# Patient Record
Sex: Female | Born: 1960
Health system: Southern US, Community
[De-identification: ages and names within clinical notes are randomized; demographics above are authoritative.]

## PROBLEM LIST (undated history)

## (undated) ENCOUNTER — Emergency Department (HOSPITAL_COMMUNITY): Admission: EM | Payer: PRIVATE HEALTH INSURANCE | Source: Home / Self Care

## (undated) DIAGNOSIS — F319 Bipolar disorder, unspecified: Secondary | ICD-10-CM

## (undated) DIAGNOSIS — M25561 Pain in right knee: Secondary | ICD-10-CM

## (undated) DIAGNOSIS — N898 Other specified noninflammatory disorders of vagina: Secondary | ICD-10-CM

## (undated) DIAGNOSIS — M25562 Pain in left knee: Secondary | ICD-10-CM

## (undated) DIAGNOSIS — G8929 Other chronic pain: Secondary | ICD-10-CM

## (undated) DIAGNOSIS — M79606 Pain in leg, unspecified: Secondary | ICD-10-CM

## (undated) DIAGNOSIS — N390 Urinary tract infection, site not specified: Secondary | ICD-10-CM

## (undated) DIAGNOSIS — IMO0002 Reserved for concepts with insufficient information to code with codable children: Secondary | ICD-10-CM

## (undated) DIAGNOSIS — M549 Dorsalgia, unspecified: Secondary | ICD-10-CM

## (undated) DIAGNOSIS — L299 Pruritus, unspecified: Secondary | ICD-10-CM

## (undated) DIAGNOSIS — B009 Herpesviral infection, unspecified: Secondary | ICD-10-CM

## (undated) DIAGNOSIS — A599 Trichomoniasis, unspecified: Secondary | ICD-10-CM

## (undated) DIAGNOSIS — B379 Candidiasis, unspecified: Secondary | ICD-10-CM

## (undated) DIAGNOSIS — M1990 Unspecified osteoarthritis, unspecified site: Secondary | ICD-10-CM

## (undated) DIAGNOSIS — Z8619 Personal history of other infectious and parasitic diseases: Secondary | ICD-10-CM

## (undated) DIAGNOSIS — K219 Gastro-esophageal reflux disease without esophagitis: Secondary | ICD-10-CM

## (undated) DIAGNOSIS — M797 Fibromyalgia: Secondary | ICD-10-CM

## (undated) HISTORY — DX: Gastro-esophageal reflux disease without esophagitis: K21.9

## (undated) HISTORY — DX: Trichomoniasis, unspecified: A59.9

## (undated) HISTORY — DX: Personal history of other infectious and parasitic diseases: Z86.19

## (undated) HISTORY — DX: Pruritus, unspecified: L29.9

## (undated) HISTORY — DX: Urinary tract infection, site not specified: N39.0

## (undated) HISTORY — DX: Other specified noninflammatory disorders of vagina: N89.8

## (undated) HISTORY — DX: Unspecified osteoarthritis, unspecified site: M19.90

## (undated) HISTORY — PX: BREAST ENHANCEMENT SURGERY: SHX7

## (undated) HISTORY — PX: ABDOMINAL HYSTERECTOMY: SHX81

## (undated) HISTORY — DX: Reserved for concepts with insufficient information to code with codable children: IMO0002

## (undated) HISTORY — DX: Herpesviral infection, unspecified: B00.9

## (undated) HISTORY — DX: Candidiasis, unspecified: B37.9

## (undated) HISTORY — PX: TUBAL LIGATION: SHX77

---

## 2000-12-30 ENCOUNTER — Emergency Department (HOSPITAL_COMMUNITY): Admission: EM | Admit: 2000-12-30 | Discharge: 2000-12-30 | Payer: Self-pay | Admitting: *Deleted

## 2001-02-05 ENCOUNTER — Emergency Department (HOSPITAL_COMMUNITY): Admission: EM | Admit: 2001-02-05 | Discharge: 2001-02-05 | Payer: Self-pay | Admitting: Emergency Medicine

## 2001-11-04 ENCOUNTER — Emergency Department (HOSPITAL_COMMUNITY): Admission: EM | Admit: 2001-11-04 | Discharge: 2001-11-04 | Payer: Self-pay | Admitting: Emergency Medicine

## 2001-12-05 ENCOUNTER — Encounter: Payer: Self-pay | Admitting: General Surgery

## 2001-12-05 ENCOUNTER — Ambulatory Visit (HOSPITAL_COMMUNITY): Admission: RE | Admit: 2001-12-05 | Discharge: 2001-12-05 | Payer: Self-pay | Admitting: General Surgery

## 2001-12-19 ENCOUNTER — Other Ambulatory Visit: Admission: RE | Admit: 2001-12-19 | Discharge: 2001-12-19 | Payer: Self-pay | Admitting: General Surgery

## 2002-04-21 ENCOUNTER — Emergency Department (HOSPITAL_COMMUNITY): Admission: EM | Admit: 2002-04-21 | Discharge: 2002-04-21 | Payer: Self-pay | Admitting: Emergency Medicine

## 2002-04-21 ENCOUNTER — Encounter: Payer: Self-pay | Admitting: Emergency Medicine

## 2002-09-03 ENCOUNTER — Encounter: Payer: Self-pay | Admitting: Emergency Medicine

## 2002-09-03 ENCOUNTER — Emergency Department (HOSPITAL_COMMUNITY): Admission: EM | Admit: 2002-09-03 | Discharge: 2002-09-03 | Payer: Self-pay | Admitting: Emergency Medicine

## 2002-09-25 ENCOUNTER — Emergency Department (HOSPITAL_COMMUNITY): Admission: EM | Admit: 2002-09-25 | Discharge: 2002-09-25 | Payer: Self-pay | Admitting: Internal Medicine

## 2002-11-01 ENCOUNTER — Encounter: Payer: Self-pay | Admitting: Emergency Medicine

## 2002-11-01 ENCOUNTER — Emergency Department (HOSPITAL_COMMUNITY): Admission: EM | Admit: 2002-11-01 | Discharge: 2002-11-01 | Payer: Self-pay | Admitting: Emergency Medicine

## 2003-02-27 ENCOUNTER — Emergency Department (HOSPITAL_COMMUNITY): Admission: EM | Admit: 2003-02-27 | Discharge: 2003-02-27 | Payer: Self-pay | Admitting: Emergency Medicine

## 2003-08-19 ENCOUNTER — Other Ambulatory Visit: Admission: RE | Admit: 2003-08-19 | Discharge: 2003-08-19 | Payer: Self-pay

## 2005-04-05 ENCOUNTER — Ambulatory Visit (HOSPITAL_COMMUNITY): Admission: RE | Admit: 2005-04-05 | Discharge: 2005-04-05 | Payer: Self-pay | Admitting: Emergency Medicine

## 2005-04-05 ENCOUNTER — Encounter: Payer: Self-pay | Admitting: Orthopedic Surgery

## 2005-04-21 ENCOUNTER — Emergency Department (HOSPITAL_COMMUNITY): Admission: EM | Admit: 2005-04-21 | Discharge: 2005-04-21 | Payer: Self-pay | Admitting: Emergency Medicine

## 2005-04-22 ENCOUNTER — Emergency Department (HOSPITAL_COMMUNITY): Admission: EM | Admit: 2005-04-22 | Discharge: 2005-04-22 | Payer: Self-pay | Admitting: Emergency Medicine

## 2005-04-23 ENCOUNTER — Inpatient Hospital Stay (HOSPITAL_COMMUNITY): Admission: EM | Admit: 2005-04-23 | Discharge: 2005-04-25 | Payer: Self-pay | Admitting: Emergency Medicine

## 2005-04-23 ENCOUNTER — Encounter: Payer: Self-pay | Admitting: Emergency Medicine

## 2006-05-11 ENCOUNTER — Emergency Department (HOSPITAL_COMMUNITY): Admission: EM | Admit: 2006-05-11 | Discharge: 2006-05-11 | Payer: Self-pay | Admitting: Emergency Medicine

## 2006-09-14 ENCOUNTER — Emergency Department (HOSPITAL_COMMUNITY): Admission: EM | Admit: 2006-09-14 | Discharge: 2006-09-14 | Payer: Self-pay | Admitting: Emergency Medicine

## 2006-09-29 ENCOUNTER — Emergency Department (HOSPITAL_COMMUNITY): Admission: EM | Admit: 2006-09-29 | Discharge: 2006-09-29 | Payer: Self-pay | Admitting: Emergency Medicine

## 2006-10-08 ENCOUNTER — Emergency Department (HOSPITAL_COMMUNITY): Admission: EM | Admit: 2006-10-08 | Discharge: 2006-10-08 | Payer: Self-pay | Admitting: Emergency Medicine

## 2006-10-19 ENCOUNTER — Emergency Department (HOSPITAL_COMMUNITY): Admission: EM | Admit: 2006-10-19 | Discharge: 2006-10-19 | Payer: Self-pay | Admitting: Emergency Medicine

## 2006-10-19 ENCOUNTER — Encounter: Payer: Self-pay | Admitting: Orthopedic Surgery

## 2006-11-11 ENCOUNTER — Emergency Department (HOSPITAL_COMMUNITY): Admission: EM | Admit: 2006-11-11 | Discharge: 2006-11-11 | Payer: Self-pay | Admitting: Emergency Medicine

## 2007-01-16 ENCOUNTER — Emergency Department (HOSPITAL_COMMUNITY): Admission: EM | Admit: 2007-01-16 | Discharge: 2007-01-16 | Payer: Self-pay | Admitting: Emergency Medicine

## 2007-01-25 ENCOUNTER — Ambulatory Visit: Payer: Self-pay | Admitting: Orthopedic Surgery

## 2007-01-29 ENCOUNTER — Other Ambulatory Visit: Admission: RE | Admit: 2007-01-29 | Discharge: 2007-01-29 | Payer: Self-pay | Admitting: Obstetrics & Gynecology

## 2007-04-04 ENCOUNTER — Telehealth: Payer: Self-pay | Admitting: Orthopedic Surgery

## 2007-04-04 ENCOUNTER — Ambulatory Visit: Payer: Self-pay | Admitting: Orthopedic Surgery

## 2007-04-04 DIAGNOSIS — IMO0002 Reserved for concepts with insufficient information to code with codable children: Secondary | ICD-10-CM

## 2007-04-04 DIAGNOSIS — M5136 Other intervertebral disc degeneration, lumbar region: Secondary | ICD-10-CM

## 2007-04-06 ENCOUNTER — Telehealth: Payer: Self-pay | Admitting: Orthopedic Surgery

## 2007-04-17 ENCOUNTER — Encounter: Payer: Self-pay | Admitting: Orthopedic Surgery

## 2007-04-21 ENCOUNTER — Emergency Department (HOSPITAL_COMMUNITY): Admission: EM | Admit: 2007-04-21 | Discharge: 2007-04-21 | Payer: Self-pay | Admitting: Emergency Medicine

## 2007-05-14 ENCOUNTER — Ambulatory Visit (HOSPITAL_COMMUNITY): Admission: RE | Admit: 2007-05-14 | Discharge: 2007-05-14 | Payer: Self-pay | Admitting: Orthopedic Surgery

## 2007-05-28 ENCOUNTER — Encounter: Admission: RE | Admit: 2007-05-28 | Discharge: 2007-05-28 | Payer: Self-pay | Admitting: Orthopedic Surgery

## 2007-06-13 ENCOUNTER — Encounter: Admission: RE | Admit: 2007-06-13 | Discharge: 2007-06-13 | Payer: Self-pay | Admitting: Internal Medicine

## 2007-09-06 ENCOUNTER — Emergency Department (HOSPITAL_COMMUNITY): Admission: EM | Admit: 2007-09-06 | Discharge: 2007-09-06 | Payer: Self-pay | Admitting: Emergency Medicine

## 2007-10-18 ENCOUNTER — Emergency Department (HOSPITAL_COMMUNITY): Admission: EM | Admit: 2007-10-18 | Discharge: 2007-10-18 | Payer: Self-pay | Admitting: Emergency Medicine

## 2008-07-14 ENCOUNTER — Other Ambulatory Visit: Admission: RE | Admit: 2008-07-14 | Discharge: 2008-07-14 | Payer: Self-pay | Admitting: Obstetrics & Gynecology

## 2008-07-25 ENCOUNTER — Emergency Department (HOSPITAL_COMMUNITY): Admission: EM | Admit: 2008-07-25 | Discharge: 2008-07-25 | Payer: Self-pay | Admitting: Emergency Medicine

## 2008-11-15 ENCOUNTER — Emergency Department (HOSPITAL_COMMUNITY): Admission: EM | Admit: 2008-11-15 | Discharge: 2008-11-15 | Payer: Self-pay | Admitting: Emergency Medicine

## 2009-01-13 ENCOUNTER — Emergency Department (HOSPITAL_COMMUNITY): Admission: EM | Admit: 2009-01-13 | Discharge: 2009-01-13 | Payer: Self-pay | Admitting: Emergency Medicine

## 2009-03-14 ENCOUNTER — Emergency Department (HOSPITAL_COMMUNITY): Admission: EM | Admit: 2009-03-14 | Discharge: 2009-03-14 | Payer: Self-pay | Admitting: Emergency Medicine

## 2009-03-31 ENCOUNTER — Emergency Department (HOSPITAL_COMMUNITY): Admission: EM | Admit: 2009-03-31 | Discharge: 2009-03-31 | Payer: Self-pay | Admitting: Emergency Medicine

## 2009-07-07 ENCOUNTER — Emergency Department (HOSPITAL_COMMUNITY): Admission: EM | Admit: 2009-07-07 | Discharge: 2009-07-07 | Payer: Self-pay | Admitting: Emergency Medicine

## 2009-08-20 ENCOUNTER — Emergency Department (HOSPITAL_COMMUNITY)
Admission: EM | Admit: 2009-08-20 | Discharge: 2009-08-20 | Payer: Self-pay | Source: Home / Self Care | Admitting: Emergency Medicine

## 2009-10-29 ENCOUNTER — Emergency Department (HOSPITAL_COMMUNITY): Admission: EM | Admit: 2009-10-29 | Discharge: 2009-10-29 | Payer: Self-pay | Admitting: Emergency Medicine

## 2010-02-15 ENCOUNTER — Other Ambulatory Visit: Admission: RE | Admit: 2010-02-15 | Discharge: 2010-02-15 | Payer: Self-pay | Admitting: Obstetrics & Gynecology

## 2010-02-23 ENCOUNTER — Ambulatory Visit (HOSPITAL_COMMUNITY): Admission: RE | Admit: 2010-02-23 | Payer: Self-pay | Admitting: Obstetrics & Gynecology

## 2010-05-08 ENCOUNTER — Encounter: Payer: Self-pay | Admitting: Obstetrics & Gynecology

## 2010-05-09 ENCOUNTER — Encounter: Payer: Self-pay | Admitting: Orthopedic Surgery

## 2010-05-09 ENCOUNTER — Encounter: Payer: Self-pay | Admitting: Internal Medicine

## 2010-05-27 ENCOUNTER — Other Ambulatory Visit: Payer: Self-pay | Admitting: Obstetrics & Gynecology

## 2010-05-27 ENCOUNTER — Encounter (HOSPITAL_COMMUNITY): Payer: PRIVATE HEALTH INSURANCE | Attending: Obstetrics & Gynecology

## 2010-05-27 DIAGNOSIS — Z01812 Encounter for preprocedural laboratory examination: Secondary | ICD-10-CM | POA: Insufficient documentation

## 2010-05-27 LAB — CBC
HCT: 39.4 % (ref 36.0–46.0)
MCH: 30.5 pg (ref 26.0–34.0)
MCHC: 33.2 g/dL (ref 30.0–36.0)
MCV: 91.6 fL (ref 78.0–100.0)
Platelets: 309 10*3/uL (ref 150–400)

## 2010-05-27 LAB — URINALYSIS, ROUTINE W REFLEX MICROSCOPIC
Bilirubin Urine: NEGATIVE
Hgb urine dipstick: NEGATIVE
Ketones, ur: NEGATIVE mg/dL

## 2010-05-27 LAB — COMPREHENSIVE METABOLIC PANEL
AST: 17 U/L (ref 0–37)
Albumin: 4 g/dL (ref 3.5–5.2)
Alkaline Phosphatase: 64 U/L (ref 39–117)
BUN: 14 mg/dL (ref 6–23)
CO2: 28 mEq/L (ref 19–32)
Calcium: 9.1 mg/dL (ref 8.4–10.5)
Chloride: 99 mEq/L (ref 96–112)
Creatinine, Ser: 0.68 mg/dL (ref 0.4–1.2)
GFR calc Af Amer: >60 mL/min (ref >60)
GFR calc non Af Amer: >60 mL/min (ref >60)

## 2010-05-27 LAB — TYPE AND SCREEN
ABO/RH(D): B POS
Antibody Screen: NEGATIVE

## 2010-05-27 LAB — HCG, QUANTITATIVE, PREGNANCY: hCG, Beta Chain, Quant, S: 2 m[IU]/mL (ref <5)

## 2010-05-27 LAB — SURGICAL PCR SCREEN: Staphylococcus aureus: NEGATIVE

## 2010-06-02 ENCOUNTER — Inpatient Hospital Stay (HOSPITAL_COMMUNITY)
Admission: RE | Admit: 2010-06-02 | Discharge: 2010-06-04 | DRG: 743 | Disposition: A | Discharge status: Home / Self Care | Payer: PRIVATE HEALTH INSURANCE | Source: Ambulatory Visit | Attending: Obstetrics & Gynecology | Admitting: Obstetrics & Gynecology

## 2010-06-02 ENCOUNTER — Other Ambulatory Visit: Payer: Self-pay | Admitting: Obstetrics & Gynecology

## 2010-06-02 DIAGNOSIS — N920 Excessive and frequent menstruation with regular cycle: Principal | ICD-10-CM | POA: Diagnosis present

## 2010-06-02 DIAGNOSIS — F319 Bipolar disorder, unspecified: Secondary | ICD-10-CM | POA: Diagnosis present

## 2010-06-02 DIAGNOSIS — K219 Gastro-esophageal reflux disease without esophagitis: Secondary | ICD-10-CM | POA: Diagnosis present

## 2010-06-02 DIAGNOSIS — G8929 Other chronic pain: Secondary | ICD-10-CM | POA: Diagnosis present

## 2010-06-02 DIAGNOSIS — F411 Generalized anxiety disorder: Secondary | ICD-10-CM | POA: Diagnosis present

## 2010-06-02 DIAGNOSIS — D259 Leiomyoma of uterus, unspecified: Secondary | ICD-10-CM | POA: Diagnosis present

## 2010-06-02 DIAGNOSIS — N946 Dysmenorrhea, unspecified: Secondary | ICD-10-CM | POA: Diagnosis present

## 2010-06-03 LAB — CBC
HCT: 36.8 % (ref 36.0–46.0)
Hemoglobin: 11.7 g/dL — ABNORMAL LOW (ref 12.0–15.0)
MCHC: 31.8 g/dL (ref 30.0–36.0)
Platelets: 267 10*3/uL (ref 150–400)
RDW: 13.6 % (ref 11.5–15.5)
WBC: 8 10*3/uL (ref 4.0–10.5)

## 2010-06-03 LAB — DIFFERENTIAL
Eosinophils Relative: 0 % (ref 0–5)
Lymphocytes Relative: 29 % (ref 12–46)
Lymphs Abs: 2.3 10*3/uL (ref 0.7–4.0)
Monocytes Relative: 10 % (ref 3–12)
Neutro Abs: 4.8 10*3/uL (ref 1.7–7.7)

## 2010-06-04 NOTE — Op Note (Signed)
[name], Annette Galvan                 ACCOUNT NO.:  [id number]  MEDICAL RECORD NO.:  [id number]           PATIENT TYPE:  I  LOCATION:  A213                          FACILITY:  [facility]  PHYSICIAN:  Lazaro Arms, M.D.   DATE OF BIRTH:  1960/12/15  DATE OF PROCEDURE: DATE OF DISCHARGE:                              OPERATIVE REPORT   PREOPERATIVE DIAGNOSES: 1. Enlarged fibroid uterus. 2. Menometrorrhagia. 3. Dysmenorrhea. 4. Chronic pelvic pain.  POSTOPERATIVE DIAGNOSES: 1. Enlarged fibroid uterus. 2. Menometrorrhagia. 3. Dysmenorrhea. 4. Chronic pelvic pain.  PROCEDURE:  Abdominal hysterectomy with bilateral salpingo-oophorectomy.  SURGEON:  Lazaro Arms, M.D.  ANESTHESIA:  General endotracheal.  FINDINGS:  The patient had multiple fibroids, which we knew preoperatively and approximately 10-week size.  The ovaries appeared to be normal.  There were no other intraperitoneal abnormalities seen.  DESCRIPTION OF OPERATION:  The patient was taken to the operating room, placed in the supine position where she underwent general endotracheal anesthesia.  The vagina was prepped, and a Foley catheter was placed. The abdomen was then prepped and draped in usual sterile fashion.  A Pfannenstiel skin incision was made and carried down sharply to the rectus fascia which was scored in the midline and extended laterally. The fascia was taken off the muscles superiorly and inferiorly without difficulty.  The muscles were divided.  Peritoneal cavity was entered. An Alexis self-retaining wound retractor was placed.  The upper abdomen was packed away.  The uterine cornu were grasped.  The left round ligament was suture ligated and cut.  The right round ligament was suture ligated and cut.  The infundibulopelvic ligament was isolated on the left.  An avascular window made; it was clamped, cut, and suture ligated.  The infundibulopelvic ligament on the right was also isolated and avascular  window made; it was clamped, cut, and suture ligated.  The bladder flap was created without difficulty.  The uterine vessels were skeletonized, clamped, cut and suture ligated bilaterally.  The bladder was pushed off the lower uterine segment without difficulty.  Serial pedicles were taken down the cervix to the cardinal ligament; each pedicle being clamped, cut, and suture ligated.  The pedicles were all hemostatic.  The vagina was cross-clamped.  The specimens removed.  The vaginal angle sutures were placed, and the vagina was closed with 0 Vicryl suture in a figure-of-eight fashion.  There was good hemostasis. The pelvis was irrigated vigorously.  All pedicles were reinspected and found to be hemostatic.  Seprafilm was placed over the vaginal cuff to prevent postoperative adhesion formation.  The packs were removed.  The muscles of peritoneum reapproximated loosely.  The fascia closed using 0 Vicryl running.  The subcutaneous tissues made hemostatic and irrigated. The subcutaneous tissue was reapproximated loosely using 0 plain, and the skin was closed using 3-0 Vicryl on a Keith needle in a subcuticular fashion.  Octylseal was also used topically for additional adhesion as well as a bacteriostatic bandage barrier.  The patient tolerated the procedure well.  She experienced 100 mL of blood loss, taken to recovery room in good stable condition.  All counts  correct x3.     Lazaro Arms, M.D.     [name]  D:  06/02/2010  T:  06/03/2010  Job:  [id number]  Electronically Signed by Duane Lope M.D. on 06/04/2010 12:55:00 PM

## 2010-07-01 NOTE — Discharge Summary (Signed)
  Annette Galvan, Annette Galvan                 ACCOUNT NO.:  [id number]  MEDICAL RECORD NO.:  [id number]           PATIENT TYPE:  I  LOCATION:  A213                          FACILITY:  [facility]  PHYSICIAN:  Lazaro Arms, M.D.   DATE OF BIRTH:  1960/07/15  DATE OF ADMISSION:  06/02/2010 DATE OF DISCHARGE:  02/17/2012LH                              DISCHARGE SUMMARY   DISCHARGE DIAGNOSES: 1. Status post total abdominal hysterectomy and bilateral salpingo-     oophorectomy. 2. Unremarkable postoperative course.  PROCEDURE:  TAH-BSO.  Please refer to the history and physical as well as the operative report for details for admission to hospital.  HOSPITAL COURSE:  The patient was admitted postoperatively.  She tolerated quickly clear liquids and then subsequently regular diet.  She was ambulatory.  She voided without difficulty.  Her incision was clean, dry, and intact.  Her hemoglobin on postop day #1 was 11.7, hematocrit was 35, white count was 8000.  She remained afebrile throughout the postoperative course, had return of bowel function with flatus.  Her abdominal exam was benign.  She was discharged to home in the morning of postop day #2, in good stable condition, to follow up the office on June 09, 2010, as scheduled.  She is sent home with prescription for Percocet and given instructions for return prior to her scheduled office visit.     Lazaro Arms, M.D.     [name]  D:  06/04/2010  T:  06/04/2010  Job:  [id number]  Electronically Signed by Duane Lope M.D. on 07/01/2010 07:42:51 AM

## 2010-07-26 ENCOUNTER — Emergency Department (HOSPITAL_COMMUNITY)
Admission: EM | Admit: 2010-07-26 | Discharge: 2010-07-26 | Disposition: A | Discharge status: Home / Self Care | Payer: PRIVATE HEALTH INSURANCE | Attending: Emergency Medicine | Admitting: Emergency Medicine

## 2010-07-26 DIAGNOSIS — F319 Bipolar disorder, unspecified: Secondary | ICD-10-CM | POA: Insufficient documentation

## 2010-07-26 DIAGNOSIS — M25569 Pain in unspecified knee: Secondary | ICD-10-CM | POA: Insufficient documentation

## 2010-07-26 DIAGNOSIS — Z79899 Other long term (current) drug therapy: Secondary | ICD-10-CM | POA: Insufficient documentation

## 2010-07-26 DIAGNOSIS — M1710 Unilateral primary osteoarthritis, unspecified knee: Secondary | ICD-10-CM | POA: Insufficient documentation

## 2010-09-03 NOTE — Consult Note (Signed)
   NAME:  TELESIA, ATES NO.:  [id number]   MEDICAL RECORD NO.:  [id number]                   PATIENT TYPE:   LOCATION:                                       FACILITY:   PHYSICIAN:  Marlane Hatcher, M.D.           DATE OF BIRTH:   DATE OF CONSULTATION:  DATE OF DISCHARGE:                           GENERAL SURGERY CONSULTATION   HISTORY OF PRESENT ILLNESS:  I performed a complete history and physical on  Ms. Prinsen in my office on December 04, 2001. In essence, she has a nodule that  appears to be subcutaneous in right breast at approximately the 1:00 o'clock  position. This appears to be in the subcutaneous tissue and not the breast  parenchyma. As you know, she has had atrophia of the right breast which has  been treated by Silicone injection in 1983. She had an inframammary incision  in her right breast where the Silicone was injected. She does not appear to  have any sequela from this. Axillary examination on both breasts were  negative. She does not have any family history of breast carcinoma.  Unfortunately, she is 50 years old  and never had a mammogram and we have  scheduled this prior to my removing the subcutaneous nodule, which I will do  in my office.   Thank you kindly for sending Ms. Rybarczyk my way. I saw in my office on December 04, 2001. I will make sure that you get a copy of Ms. Argote's pathology  report for your records. I am always happy to be of service to the [facility].                                               Marlane Hatcher, M.D.    WSB/MEDQ  D:  12/04/2001  T:  12/05/2001  Job:  [id number]   cc:   [facility]   Kingsley Callander. Ouida Sills, M.D.

## 2010-09-22 ENCOUNTER — Emergency Department (HOSPITAL_COMMUNITY): Payer: PRIVATE HEALTH INSURANCE

## 2010-09-22 ENCOUNTER — Emergency Department (HOSPITAL_COMMUNITY)
Admission: EM | Admit: 2010-09-22 | Discharge: 2010-09-22 | Disposition: A | Discharge status: Home / Self Care | Payer: PRIVATE HEALTH INSURANCE | Attending: Emergency Medicine | Admitting: Emergency Medicine

## 2010-09-22 DIAGNOSIS — M1610 Unilateral primary osteoarthritis, unspecified hip: Secondary | ICD-10-CM | POA: Insufficient documentation

## 2010-09-22 DIAGNOSIS — M169 Osteoarthritis of hip, unspecified: Secondary | ICD-10-CM | POA: Insufficient documentation

## 2010-09-22 DIAGNOSIS — F17200 Nicotine dependence, unspecified, uncomplicated: Secondary | ICD-10-CM | POA: Insufficient documentation

## 2010-09-22 DIAGNOSIS — M25559 Pain in unspecified hip: Secondary | ICD-10-CM | POA: Insufficient documentation

## 2010-09-28 ENCOUNTER — Emergency Department (HOSPITAL_COMMUNITY)
Admission: EM | Admit: 2010-09-28 | Discharge: 2010-09-28 | Disposition: A | Discharge status: Home / Self Care | Payer: PRIVATE HEALTH INSURANCE | Attending: Emergency Medicine | Admitting: Emergency Medicine

## 2010-09-28 DIAGNOSIS — IMO0002 Reserved for concepts with insufficient information to code with codable children: Secondary | ICD-10-CM | POA: Insufficient documentation

## 2010-09-28 DIAGNOSIS — M1710 Unilateral primary osteoarthritis, unspecified knee: Secondary | ICD-10-CM | POA: Insufficient documentation

## 2010-09-28 DIAGNOSIS — M169 Osteoarthritis of hip, unspecified: Secondary | ICD-10-CM | POA: Insufficient documentation

## 2010-09-28 DIAGNOSIS — M25559 Pain in unspecified hip: Secondary | ICD-10-CM | POA: Insufficient documentation

## 2010-09-28 DIAGNOSIS — M1610 Unilateral primary osteoarthritis, unspecified hip: Secondary | ICD-10-CM | POA: Insufficient documentation

## 2010-11-16 ENCOUNTER — Other Ambulatory Visit: Payer: Self-pay | Admitting: Orthopedic Surgery

## 2010-11-16 DIAGNOSIS — M545 Low back pain: Secondary | ICD-10-CM

## 2010-11-24 ENCOUNTER — Other Ambulatory Visit: Payer: PRIVATE HEALTH INSURANCE

## 2010-11-27 ENCOUNTER — Ambulatory Visit
Admission: RE | Admit: 2010-11-27 | Discharge: 2010-11-27 | Disposition: A | Discharge status: Home / Self Care | Payer: PRIVATE HEALTH INSURANCE | Source: Ambulatory Visit | Attending: Orthopedic Surgery | Admitting: Orthopedic Surgery

## 2010-11-27 DIAGNOSIS — M545 Low back pain: Secondary | ICD-10-CM

## 2010-12-10 ENCOUNTER — Ambulatory Visit (HOSPITAL_COMMUNITY): Payer: PRIVATE HEALTH INSURANCE | Admitting: Physical Therapy

## 2010-12-29 ENCOUNTER — Ambulatory Visit (HOSPITAL_COMMUNITY): Payer: PRIVATE HEALTH INSURANCE | Admitting: Physical Therapy

## 2011-01-13 LAB — URINALYSIS, ROUTINE W REFLEX MICROSCOPIC
Bilirubin Urine: NEGATIVE
Protein, ur: NEGATIVE
Specific Gravity, Urine: 1.025

## 2011-01-13 LAB — URINE MICROSCOPIC-ADD ON

## 2011-01-18 ENCOUNTER — Ambulatory Visit (HOSPITAL_COMMUNITY): Payer: PRIVATE HEALTH INSURANCE | Admitting: Physical Therapy

## 2011-02-02 ENCOUNTER — Other Ambulatory Visit: Payer: Self-pay | Admitting: Neurosurgery

## 2011-02-02 DIAGNOSIS — M545 Low back pain: Secondary | ICD-10-CM

## 2011-02-10 ENCOUNTER — Ambulatory Visit
Admission: RE | Admit: 2011-02-10 | Discharge: 2011-02-10 | Disposition: A | Discharge status: Home / Self Care | Payer: PRIVATE HEALTH INSURANCE | Source: Ambulatory Visit | Attending: Neurosurgery | Admitting: Neurosurgery

## 2011-02-10 DIAGNOSIS — M545 Low back pain, unspecified: Secondary | ICD-10-CM

## 2011-03-26 ENCOUNTER — Encounter: Payer: Self-pay | Admitting: *Deleted

## 2011-03-26 ENCOUNTER — Emergency Department (HOSPITAL_COMMUNITY)
Admission: EM | Admit: 2011-03-26 | Discharge: 2011-03-26 | Disposition: A | Discharge status: Home / Self Care | Payer: PRIVATE HEALTH INSURANCE | Attending: Emergency Medicine | Admitting: Emergency Medicine

## 2011-03-26 DIAGNOSIS — F17200 Nicotine dependence, unspecified, uncomplicated: Secondary | ICD-10-CM | POA: Insufficient documentation

## 2011-03-26 DIAGNOSIS — F319 Bipolar disorder, unspecified: Secondary | ICD-10-CM | POA: Insufficient documentation

## 2011-03-26 DIAGNOSIS — M545 Low back pain, unspecified: Secondary | ICD-10-CM | POA: Insufficient documentation

## 2011-03-26 DIAGNOSIS — M25559 Pain in unspecified hip: Secondary | ICD-10-CM | POA: Insufficient documentation

## 2011-03-26 DIAGNOSIS — M549 Dorsalgia, unspecified: Secondary | ICD-10-CM

## 2011-03-26 DIAGNOSIS — M79609 Pain in unspecified limb: Secondary | ICD-10-CM | POA: Insufficient documentation

## 2011-03-26 DIAGNOSIS — M5126 Other intervertebral disc displacement, lumbar region: Secondary | ICD-10-CM | POA: Insufficient documentation

## 2011-03-26 HISTORY — DX: Bipolar disorder, unspecified: F31.9

## 2011-03-26 HISTORY — DX: Dorsalgia, unspecified: M54.9

## 2011-03-26 HISTORY — DX: Other chronic pain: G89.29

## 2011-03-26 MED ORDER — OXYCODONE-ACETAMINOPHEN 5-325 MG PO TABS
1.0000 | ORAL_TABLET | ORAL | Status: AC | PRN
Start: 2011-03-26 — End: 2011-04-05

## 2011-03-26 MED ORDER — CYCLOBENZAPRINE HCL 5 MG PO TABS: 5.0000 mg | ORAL_TABLET | Freq: Three times a day (TID) | ORAL | Status: AC | PRN

## 2011-03-26 NOTE — ED Notes (Signed)
Left hip/leg x 6 months, dx with bulging disc, here for pain control per pt., states that the pain is worse today

## 2011-03-26 NOTE — ED Notes (Signed)
Pt presents with left leg, hip, and low back pain. Pt was diagnosed with "bulging disc". Pt is here for "pain control". Pt states she has had this pain for 6 months and pain has been increasing today. NAD at this time. Pt ambulates with steady gate.

## 2011-03-26 NOTE — ED Notes (Signed)
Pt a/ox4. Resp even and unlabored. NAD at this time. D/C instructions and Rx reviewed with pt. Pt verbalized understanding. Pt ambulated to lobby with steady gate.  

## 2011-03-27 NOTE — ED Provider Notes (Signed)
History     CSN: [id number] Arrival date & time: 03/26/2011  2:38 PM   First MD Initiated Contact with Patient 03/26/11 1446      Chief Complaint  Patient presents with  . Hip Pain  . Leg Pain    (Consider location/radiation/quality/duration/timing/severity/associated sxs/prior treatment) Patient is a 50 y.o. female presenting with hip pain. The history is provided by the patient.  Hip Pain This is a chronic problem. Episode onset: She has a history of lumbar disk herniation with increased pain today without new injury. The problem occurs constantly. The problem has been unchanged (Her pain radiated to her left posterior thigh). Associated symptoms include arthralgias. Pertinent negatives include no abdominal pain, chest pain, congestion, fever, headaches, joint swelling, nausea, neck pain, numbness, rash, sore throat, urinary symptoms or weakness. The symptoms are aggravated by bending, standing, walking and twisting. She has tried rest and acetaminophen for the symptoms. The treatment provided no relief.    Past Medical History  Diagnosis Date  . Bipolar 1 disorder   . Back pain, chronic     History reviewed. No pertinent past surgical history.  History reviewed. No pertinent family history.  History  Substance Use Topics  . Smoking status: Current Everyday Smoker -- 0.5 packs/day    Types: Cigarettes  . Smokeless tobacco: Not on file  . Alcohol Use: No    OB History    Grav Para Term Preterm Abortions TAB SAB Ect Mult Living                  Review of Systems  Constitutional: Negative for fever.  HENT: Negative for congestion, sore throat and neck pain.   Eyes: Negative.   Respiratory: Negative for chest tightness and shortness of breath.   Cardiovascular: Negative for chest pain.  Gastrointestinal: Negative for nausea and abdominal pain.  Genitourinary: Negative.   Musculoskeletal: Positive for back pain and arthralgias. Negative for joint swelling.  Skin:  Negative.  Negative for rash and wound.  Neurological: Negative for dizziness, weakness, light-headedness, numbness and headaches.  Hematological: Negative.   Psychiatric/Behavioral: Negative.     Allergies  Ampicillin  Home Medications   Current Outpatient Rx  Name Route Sig Dispense Refill  . CYCLOBENZAPRINE HCL 5 MG PO TABS Oral Take 1 tablet (5 mg total) by mouth 3 (three) times daily as needed for muscle spasms. 15 tablet 0  . OXYCODONE-ACETAMINOPHEN 5-325 MG PO TABS Oral Take 1 tablet by mouth every 4 (four) hours as needed for pain. 15 tablet 0    BP 130/80  Pulse 84  Temp(Src) 98.2 F (36.8 C) (Oral)  Resp 18  Ht 5\' 3"  (1.6 m)  Wt 250 lb (841.324 kg)  BMI 44.29 kg/m2  SpO2 96%  Physical Exam  Nursing note and vitals reviewed. Constitutional: She is oriented to person, place, and time. She appears well-developed and well-nourished.  HENT:  Head: Normocephalic.  Eyes: Conjunctivae are normal.  Neck: Normal range of motion. Neck supple.  Cardiovascular: Regular rhythm and intact distal pulses.        Pedal pulses normal.  Pulmonary/Chest: Effort normal. She has no wheezes.  Abdominal: Soft. Bowel sounds are normal. She exhibits no distension and no mass.  Musculoskeletal: Normal range of motion. She exhibits no edema.       Lumbar back: She exhibits tenderness. She exhibits no swelling, no edema and no spasm.  Neurological: She is alert and oriented to person, place, and time. She has normal strength. She displays no  atrophy and no tremor. No cranial nerve deficit or sensory deficit. Gait normal.  Reflex Scores:      Patellar reflexes are 2+ on the right side and 2+ on the left side.      Achilles reflexes are 2+ on the right side and 2+ on the left side.      No strength deficit noted in hip and knee flexor and extensor muscle groups.  Ankle flexion and extension intact.  Skin: Skin is warm and dry.  Psychiatric: She has a normal mood and affect.    ED Course    Procedures (including critical care time)  Labs Reviewed - No data to display No results found.   1. Back pain       MDM  Chronic low back pain with known disk herniation.  No neuro deficit by history or exam.  The patient appears reasonably screened and/or stabilized for discharge and I doubt any other medical condition or other [facility] requiring further screening, evaluation, or treatment in the ED at this time prior to discharge.        Candis Musa, PA 03/27/11 2311

## 2011-03-28 NOTE — ED Provider Notes (Signed)
Medical screening examination/treatment/procedure(s) were performed by non-physician practitioner and as supervising physician I was immediately available for consultation/collaboration.   Micki Cassel L Edras Wilford, MD 03/28/11 0902 

## 2011-04-19 DIAGNOSIS — B009 Herpesviral infection, unspecified: Secondary | ICD-10-CM

## 2011-04-19 HISTORY — DX: Herpesviral infection, unspecified: B00.9

## 2011-05-09 ENCOUNTER — Ambulatory Visit (HOSPITAL_COMMUNITY): Payer: PRIVATE HEALTH INSURANCE | Admitting: Physical Therapy

## 2011-05-10 ENCOUNTER — Ambulatory Visit (HOSPITAL_COMMUNITY)
Admission: RE | Admit: 2011-05-10 | Discharge: 2011-05-10 | Disposition: A | Discharge status: Home / Self Care | Payer: PRIVATE HEALTH INSURANCE | Source: Ambulatory Visit | Attending: Orthopedic Surgery | Admitting: Orthopedic Surgery

## 2011-05-10 ENCOUNTER — Ambulatory Visit (HOSPITAL_COMMUNITY): Payer: PRIVATE HEALTH INSURANCE

## 2011-05-10 DIAGNOSIS — M25562 Pain in left knee: Secondary | ICD-10-CM | POA: Insufficient documentation

## 2011-05-10 DIAGNOSIS — M25569 Pain in unspecified knee: Secondary | ICD-10-CM | POA: Insufficient documentation

## 2011-05-10 DIAGNOSIS — M545 Low back pain, unspecified: Secondary | ICD-10-CM | POA: Insufficient documentation

## 2011-05-10 DIAGNOSIS — R262 Difficulty in walking, not elsewhere classified: Secondary | ICD-10-CM | POA: Insufficient documentation

## 2011-05-10 DIAGNOSIS — M6281 Muscle weakness (generalized): Secondary | ICD-10-CM | POA: Insufficient documentation

## 2011-05-10 DIAGNOSIS — IMO0001 Reserved for inherently not codable concepts without codable children: Secondary | ICD-10-CM | POA: Insufficient documentation

## 2011-05-10 NOTE — Progress Notes (Signed)
Physical Therapy Evaluation  Patient Details  Name: Annette Galvan MRN: [id number] Date of Birth: [birth year]  Today's Date: 05/10/2011 Time: 5784-6962 Time Calculation (min): 47 min Charges: I EV, 15 TE Visit#: 1  of 16   Re-eval: 06/09/11 Assessment Diagnosis: low back and bil knee pain Next MD Visit: none Prior Therapy: none  Past Medical History:  Past Medical History  Diagnosis Date  . Bipolar 1 disorder   . Back pain, chronic    Past Surgical History: No past surgical history on file.  Subjective Symptoms/Limitations Symptoms: back pain with bulging disc for a year, leg pain for about a year.   Limitations: Sitting;Lifting;Standing;Walking;House hold activities How long can you sit comfortably?: 10-15 How long can you stand comfortably?: 5 mins How long can you walk comfortably?: 10 mins Repetition: Increases Symptoms Pain Assessment Currently in Pain?: Yes Pain Score:   8 Pain Location: Back Pain Orientation: Mid;Lower Pain Type: Chronic pain Pain Onset: More than a month ago Pain Frequency: Constant Pain Relieving Factors: nothing, takes meds, but don't help Effect of Pain on Daily Activities: can't go up or down steps well, not sleeping well-aching legs (sleeps in all positions), unable to lift things, trouble getting in/out of tub Multiple Pain Sites: Yes  Prior Functioning  Prior Function Able to Take Stairs?: Yes Driving: No Vocation: On disability (SSI and social security)  Sensation/Coordination/Flexibility/Functional Tests Sensation Light Touch: Impaired Detail Light Touch Impaired Details: Impaired LLE (bottom of left foot.  )  Assessment RLE AROM (degrees) Right Knee Extension 0-130: -3  Right Knee Flexion 0-140: 90  RLE Strength Right Hip Flexion: 3-/5 Right Hip Extension: 3-/5 Right Hip ABduction: 4/5 Right Knee Flexion: 4/5 Right Knee Extension: 3/5 (painful) Right Ankle Dorsiflexion: 4/5 Right Ankle Plantar Flexion: 4/5 LLE AROM  (degrees) Left Knee Extension 0-130: -3  Left Knee Flexion 0-140: 110  LLE Strength Left Hip Flexion: 3+/5 Left Hip Extension: 3/5 Left Hip ABduction: 4/5 Left Knee Flexion: 3+/5 Left Knee Extension: 3+/5 Left Ankle Dorsiflexion: 5/5 Left Ankle Plantar Flexion: 4/5 Lumbar AROM Lumbar Flexion: WFL (on right side low back , knee a calf, most painful ) Lumbar Extension: WFL (painful in low back) Lumbar - Right Side Bend: Decreased 50% (mildly painful) Lumbar - Left Side Bend: Decreased 50% (minimally painful) Lumbar - Right Rotation: WFL (no pain) Lumbar - Left Rotation: WFL (no pain) Lumbar Strength Lumbar Flexion: 3-/5 Lumbar Extension: 2+/5  Exercise/Treatments Mobility/Balance  Bed Mobility Bed Mobility:  (painful with all bed mobility) Ambulation/Gait Ambulation/Gait: Yes Ambulation/Gait Assistance: 7: Independent Ambulation Distance (Feet): 150 Feet Assistive device: None;Other (Comment) (patient is interested in using a cane) Gait Pattern: Antalgic;Step-through pattern   Lumbar Exercises Stability Ab Set: 10 reps;5 seconds Supine Quad Sets: Both;10 reps;Limitations Quad Sets Limitations: 5" holds Heel Slides: AROM;Both;10 reps;Limitations Heel Slides Limitations: with ab set Bridges: 10 reps  Physical Therapy Assessment and Plan  Clinical Impression Statement: 51 y.o. female presents to [facility] OP PT for low back pain and bil knee pain.  Patient and script indicate bulging disc (left side per patient).  The patient has limited right greater than left knee ROM and srtrength, decreased lumbar motion and core strength and nurological symptoms into her left foot.  She would benefit from skilled PT to [facility] her independence, strength, ROM and functional mobility as well as help with her pain management so that she can return to a more productive and less painful lifestyle.  She would also like to lose weight to help with  both her knee and back pain.   Rehab Potential:  Good PT Frequency: Min 2X/week PT Duration: 8 weeks PT Treatment/Interventions: DME instruction;Gait training;Stair training;Therapeutic activities;Functional mobility training;Therapeutic exercise;Balance training;Neuromuscular re-education;Patient/family education (modalities PRN for pain, STM PRN, inoto PRN) PT Plan: Continue 2xs/wk x 8 weeks, review HEP (heel slides with ab set, ab set, bridges, quad sets), try gait training with single point cane next visit in hospital, progress bil knee and abdominal exercises gently. Add heat and IFC at end of next session.      Goals Home Exercise Program Pt will Perform Home Exercise Program: Independently PT Short Term Goals Time to Complete Short Term Goals: 4 weeks PT Short Term Goal 1: The patient will report average daily pain of less than 5/10 to show improved QOL and tolerance of functional activiites.   PT Short Term Goal 2: The patient will increase bil leg strength by 1/2 muscle grade to show improved strength and ability to perform home activities.   PT Short Term Goal 3: The patient will demonstrate increased core muscle strength by 1/2 muscle grade to show improved spinal stability PT Long Term Goals Time to Complete Long Term Goals: 8 weeks PT Long Term Goal 1: The patient reports average daily pain of less than or equal to 3/10 to show improved QOL and increased tolerance of functional activities.   PT Long Term Goal 2: The patient will deomonstrate equal bil knee ROM to show improved symmetry bil which will help normalize her gait.   Long Term Goal 3: The patient will increase leg strength a full muscle grade in the areas that are weak bil to show improved strength in bil legs and improved ability to perform daily activities.   Long Term Goal 4: The patient will increase core muscle strength to at least 4/5 to show improved spinal stability and increased ease of bed mobility.   PT Long Term Goal 5: The patient will report that she can  stand for greater than  20 mins to demonstrate improved endurance and decreased pain/increased daily activity tolerance.    Problem List Patient Active Problem List  Diagnoses  . BACK PAIN  . ANSERINE BURSITIS  . Bilateral knee pain  . Difficulty in walking  . Muscle weakness (generalized)    PT - End of Session Activity Tolerance: Patient tolerated treatment well;Patient limited by pain General Behavior During Session: [facility] for tasks performed Cognition: [facility] for tasks performed PT Plan of Care PT Home Exercise Plan: see scanned report.   Consulted and Agree with Plan of Care: Patient   Rollene Rotunda. Zali Kamaka, PT, DPT  05/10/2011, 10:46 AM  Physician Documentation Your signature is required to indicate approval of the treatment plan as stated above.  Please sign and either send electronically or make a copy of this report for your files and return this physician signed original.   Please mark one 1.__approve of plan  2. ___approve of plan with the following conditions.   ______________________________                                                          _____________________ Physician Signature  Date  

## 2011-05-16 ENCOUNTER — Ambulatory Visit (HOSPITAL_COMMUNITY)
Admission: RE | Admit: 2011-05-16 | Discharge: 2011-05-16 | Disposition: A | Discharge status: Home / Self Care | Payer: PRIVATE HEALTH INSURANCE | Source: Ambulatory Visit | Attending: Orthopedic Surgery | Admitting: Orthopedic Surgery

## 2011-05-16 ENCOUNTER — Encounter (HOSPITAL_COMMUNITY): Payer: Self-pay | Admitting: *Deleted

## 2011-05-16 ENCOUNTER — Emergency Department (HOSPITAL_COMMUNITY)
Admission: EM | Admit: 2011-05-16 | Discharge: 2011-05-16 | Disposition: A | Discharge status: Home / Self Care | Payer: PRIVATE HEALTH INSURANCE | Attending: Emergency Medicine | Admitting: Emergency Medicine

## 2011-05-16 DIAGNOSIS — M25561 Pain in right knee: Secondary | ICD-10-CM

## 2011-05-16 DIAGNOSIS — F17200 Nicotine dependence, unspecified, uncomplicated: Secondary | ICD-10-CM | POA: Insufficient documentation

## 2011-05-16 DIAGNOSIS — IMO0002 Reserved for concepts with insufficient information to code with codable children: Secondary | ICD-10-CM | POA: Insufficient documentation

## 2011-05-16 DIAGNOSIS — M5416 Radiculopathy, lumbar region: Secondary | ICD-10-CM

## 2011-05-16 DIAGNOSIS — M545 Low back pain, unspecified: Secondary | ICD-10-CM | POA: Insufficient documentation

## 2011-05-16 DIAGNOSIS — R262 Difficulty in walking, not elsewhere classified: Secondary | ICD-10-CM

## 2011-05-16 DIAGNOSIS — M6281 Muscle weakness (generalized): Secondary | ICD-10-CM

## 2011-05-16 DIAGNOSIS — G8929 Other chronic pain: Secondary | ICD-10-CM | POA: Insufficient documentation

## 2011-05-16 DIAGNOSIS — F319 Bipolar disorder, unspecified: Secondary | ICD-10-CM | POA: Insufficient documentation

## 2011-05-16 HISTORY — DX: Pain in leg, unspecified: M79.606

## 2011-05-16 MED ORDER — OXYCODONE-ACETAMINOPHEN 5-325 MG PO TABS: ORAL_TABLET | ORAL | Status: DC

## 2011-05-16 MED ORDER — OXYCODONE-ACETAMINOPHEN 5-325 MG PO TABS
1.0000 | ORAL_TABLET | Freq: Once | ORAL | Status: AC
  Administered 2011-05-16: 1 via ORAL
  Filled 2011-05-16: qty 1

## 2011-05-16 NOTE — ED Provider Notes (Signed)
Medical screening examination/treatment/procedure(s) were performed by non-physician practitioner and as supervising physician I was immediately available for consultation/collaboration.   Bhavika Schnider, MD 05/16/11 1658 

## 2011-05-16 NOTE — Progress Notes (Addendum)
Physical Therapy Treatment Patient Details  Name: Annette Galvan MRN: [id number] Date of Birth: 29-Aug-1960  Today's Date: 05/16/2011 Time: 4782-9562 Time Calculation (min): 45 min Charges: 8 gait, 8 TE, 20 mins heat and IFC.   Visit#: 2  of 16   Re-eval: 06/09/11    Subjective: Symptoms/Limitations Symptoms: hurts low back and right knee today.  Has not been compliant with HEP due to pain.  "I'm not going to be the type of person who will do those exercises."  We had a long talk about the importance of doing HEP in getting better. Our two sessions a week here would no make her stronger.   Pain Assessment Pain Score: 10-Worst pain ever at beginning of session, 7/10 low back after heat and IFC.   Pain Location: Back Pain Orientation: Lower Pain Type: Chronic pain  Exercise/Treatments Gait Training: 150' x 2 with single point cane in patient's left hand (secondary to patient reports right knee pain is worse).  2 LOB while walking requiring min assist to recover.  Verbal cues for correct leg and cane sequencing.    Lumbar Exercises Stability Ab Set: 10 reps;Limitations AB Set Limitations: 5" holds Supine Quad Sets: Both;10 reps;Limitations Quad Sets Limitations: 5" holds  Modalities Modalities: Electrical Stimulation;Moist Heat Moist Heat Therapy Number Minutes Moist Heat: 20 Minutes Moist Heat Location: Other (comment) (low back) Electrical Stimulation Electrical Stimulation Location: low back central (supine with knee pillow) Electrical Stimulation Action: IFC Electrical Stimulation Parameters: 20 mins, intensity to patient's tolerace pre set parameters.   Electrical Stimulation Goals: Pain  Physical Therapy Assessment and Plan Clinical Impression Statement: The patient was unable to tolerate much exercise today, so we switched to pain management strategies with the heat and IFC.  She was tearful and crying during the exercises.  She reports she is going to the emergency room  after our session.   PT Plan: Continue as able with ther ex.  Try starting on the heating pad to do exercises to help with pain during ther ex.  Continue gait training with single point cane.  Try heel slides, ab set, bridges, quad set and continue with IFC and heat at the end .      Problem List Patient Active Problem List  Diagnoses  . BACK PAIN  . ANSERINE BURSITIS  . Bilateral knee pain  . Difficulty in walking  . Muscle weakness (generalized)    PT - End of Session Activity Tolerance: Patient limited by pain PT Plan of Care PT Home Exercise Plan: see scanned report. Encouraged patient to try HEP.   Consulted and Agree with Plan of Care: Patient  Rollene Rotunda. Rylon Poitra, PT, DPT  05/16/2011, 8:31 AM

## 2011-05-16 NOTE — ED Notes (Signed)
Pt c/o lower back and leg pain. Pt states she has had pain for a year but pain has gotten worse recently.

## 2011-05-16 NOTE — ED Provider Notes (Signed)
History     CSN: [id number]  Arrival date & time 05/16/11  4098   First MD Initiated Contact with Patient 05/16/11 [phone]      Chief Complaint  Patient presents with  . Back Pain  . Leg Pain    (Consider location/radiation/quality/duration/timing/severity/associated sxs/prior treatment) HPI Comments: Pt has seen Annette Galvan PCP, dr. Valentina Gu and most recently dr. Franky Macho.  Annette Galvan has 2-3 HNP's.  Annette Galvan recently started therapy.  Annette Galvan has gained approx 30 lbs "since they put me on thorazine".  Annette Galvan has Annette Galvan next appt with dr. Franky Macho on 05-24-11 for another series of injections.  Pain radiates to R lower leg.  Patient is a 51 y.o. female presenting with back pain and leg pain. The history is provided by the patient. No language interpreter was used.  Back Pain  This is a recurrent problem. Episode onset: several months ago. but worse recently. The problem occurs constantly. The problem has been gradually worsening. The pain is associated with no known injury. The pain is present in the lumbar spine. The quality of the pain is described as shooting and burning. The pain radiates to the right thigh. The pain is at a severity of 9/10. The symptoms are aggravated by bending, twisting and certain positions. The pain is the same all the time. Associated symptoms include leg pain. Pertinent negatives include no fever, no bowel incontinence and no perianal numbness. Treatments tried: recent steroid injections. Risk factors include obesity and a sedentary lifestyle.  Leg Pain     Past Medical History  Diagnosis Date  . Bipolar 1 disorder   . Back pain, chronic   . Leg pain     Past Surgical History  Procedure Date  . Abdominal hysterectomy     History reviewed. No pertinent family history.  History  Substance Use Topics  . Smoking status: Current Everyday Smoker -- 0.5 packs/day    Types: Cigarettes  . Smokeless tobacco: Not on file  . Alcohol Use: No    OB History    Grav Para Term Preterm Abortions  TAB SAB Ect Mult Living                  Review of Systems  Constitutional: Negative for fever.  Gastrointestinal: Negative for bowel incontinence.  Musculoskeletal: Positive for back pain.  All other systems reviewed and are negative.    Allergies  Ampicillin  Home Medications   Current Outpatient Rx  Name Route Sig Dispense Refill  . ALPRAZOLAM 1 MG PO TABS Oral Take 1 mg by mouth 4 (four) times daily as needed. For anxiety    . GABAPENTIN 400 MG PO CAPS Oral Take 400 mg by mouth 4 (four) times daily.      BP 148/91  Pulse 82  Temp(Src) 97.9 F (36.6 C) (Oral)  Resp 20  Ht 5\' 3"  (1.6 m)  Wt 259 lb (117.482 kg)  BMI 45.88 kg/m2  SpO2 100%  Physical Exam  Nursing note and vitals reviewed. Constitutional: Annette Galvan is oriented to person, place, and time. Annette Galvan appears well-developed and well-nourished. No distress.  HENT:  Head: Normocephalic and atraumatic.  Eyes: EOM are normal.  Neck: Normal range of motion.  Cardiovascular: Normal rate, regular rhythm and normal heart sounds.   Pulmonary/Chest: Effort normal and breath sounds normal.  Abdominal: Soft. Annette Galvan exhibits no distension. There is no tenderness.  Musculoskeletal:       Lumbar back: Annette Galvan exhibits decreased range of motion, tenderness and pain. Annette Galvan exhibits no bony tenderness,  no swelling, no edema, no deformity, no laceration, no spasm and normal pulse.       Back:  Neurological: Annette Galvan is alert and oriented to person, place, and time. Annette Galvan has normal strength. No cranial nerve deficit or sensory deficit. GCS eye subscore is 4. GCS verbal subscore is 5. GCS motor subscore is 6.  Reflex Scores:      Patellar reflexes are 2+ on the right side and 2+ on the left side.      Achilles reflexes are 2+ on the right side. Skin: Skin is warm and dry.  Psychiatric: Annette Galvan has a normal mood and affect. Judgment normal.    ED Course  Procedures (including critical care time)  Labs Reviewed - No data to display No results  found.   No diagnosis found.    MDM          Worthy Rancher, PA 05/16/11 [phone]

## 2011-05-19 ENCOUNTER — Telehealth (HOSPITAL_COMMUNITY): Payer: Self-pay

## 2011-05-19 ENCOUNTER — Ambulatory Visit (HOSPITAL_COMMUNITY): Payer: PRIVATE HEALTH INSURANCE

## 2011-05-24 ENCOUNTER — Inpatient Hospital Stay (HOSPITAL_COMMUNITY): Admission: RE | Admit: 2011-05-24 | Payer: PRIVATE HEALTH INSURANCE | Source: Ambulatory Visit

## 2011-05-24 ENCOUNTER — Telehealth (HOSPITAL_COMMUNITY): Payer: Self-pay

## 2011-05-26 ENCOUNTER — Ambulatory Visit (HOSPITAL_COMMUNITY)
Admission: RE | Admit: 2011-05-26 | Discharge: 2011-05-26 | Disposition: A | Discharge status: Home / Self Care | Payer: PRIVATE HEALTH INSURANCE | Source: Ambulatory Visit | Attending: Orthopedic Surgery | Admitting: Orthopedic Surgery

## 2011-05-26 DIAGNOSIS — IMO0001 Reserved for inherently not codable concepts without codable children: Secondary | ICD-10-CM | POA: Insufficient documentation

## 2011-05-26 DIAGNOSIS — R262 Difficulty in walking, not elsewhere classified: Secondary | ICD-10-CM | POA: Insufficient documentation

## 2011-05-26 DIAGNOSIS — M545 Low back pain, unspecified: Secondary | ICD-10-CM | POA: Insufficient documentation

## 2011-05-26 DIAGNOSIS — M25569 Pain in unspecified knee: Secondary | ICD-10-CM | POA: Insufficient documentation

## 2011-05-26 DIAGNOSIS — M6281 Muscle weakness (generalized): Secondary | ICD-10-CM | POA: Insufficient documentation

## 2011-05-26 DIAGNOSIS — M25562 Pain in left knee: Secondary | ICD-10-CM

## 2011-05-26 NOTE — Progress Notes (Signed)
Physical Therapy Treatment Patient Details  Name: Annette Galvan MRN: [id number] Date of Birth: [birth year]  Today's Date: 05/26/2011 Time: 4098-1191 Time Calculation (min): 63 min Visit#: 4  of 16   Re-eval: 06/09/11  Charge: therex 45 min IFES/ MHP 15 min  Subjective: Symptoms/Limitations Symptoms: "10" low back, cramps and spasms.   Pain Assessment Currently in Pain?: Yes Pain Score: 10-Worst pain ever Pain Location: Back Pain Orientation: Lower Pain Type: Chronic pain Pain Radiating Towards: bil legs right and left into feet.    Objective:  Exercise/Treatments Aerobic Tread Mill: 6\' 30" @ 1.0 Supine Quad Sets: Both;10 reps;Limitations Quad Sets Limitations: 5" holds with towel and ab set Heel Slides: AROM;Both;10 reps;Limitations Heel Slides Limitations: with ab set Bridges: 10 reps   Modalities Modalities: Electrical Stimulation;Moist Heat Moist Heat Therapy Number Minutes Moist Heat: 15 Minutes Moist Heat Location:  (low back) Museum/gallery exhibitions officer Stimulation Location: low back with LE Elevated Electrical Stimulation Action: IFES hi/lo sweep Electrical Stimulation Parameters: hi/lo sweep 15  volts Electrical Stimulation Goals: Pain  Physical Therapy Assessment and Plan PT Assessment and Plan Clinical Impression Statement: Pt instructed proper technique with bed mobility as well as proper sit to stand, pt able to demonstrate correctly.  Mat exercises complete with MHP.  Pt able to fully extend knees with heel slides with no LBP following cueing to initate ab set before movement.  Pt stated pain reduced to 5/10 following IFES with MHP at end of session. PT Plan: Continue with current POC, resume gait training with SPC/treadmill, core strengthening, and begin sit to stand as tolerated.    Goals    Problem List Patient Active Problem List  Diagnoses  . BACK PAIN  . ANSERINE BURSITIS  . Bilateral knee pain  . Difficulty in walking  . Muscle  weakness (generalized)    PT - End of Session Activity Tolerance: Patient tolerated treatment well;Patient limited by pain General Behavior During Session: [facility] for tasks performed Cognition: [facility] for tasks performed  Juel Burrow 05/26/2011, 10:54 AM

## 2011-05-31 ENCOUNTER — Ambulatory Visit (HOSPITAL_COMMUNITY)
Admission: RE | Admit: 2011-05-31 | Discharge: 2011-05-31 | Disposition: A | Discharge status: Home / Self Care | Payer: PRIVATE HEALTH INSURANCE | Source: Ambulatory Visit | Attending: Internal Medicine | Admitting: Internal Medicine

## 2011-05-31 DIAGNOSIS — M6281 Muscle weakness (generalized): Secondary | ICD-10-CM

## 2011-05-31 DIAGNOSIS — M25561 Pain in right knee: Secondary | ICD-10-CM

## 2011-05-31 DIAGNOSIS — R262 Difficulty in walking, not elsewhere classified: Secondary | ICD-10-CM

## 2011-05-31 NOTE — Progress Notes (Signed)
Physical Therapy Treatment Patient Details  Name: Annette Galvan MRN: [id number] Date of Birth: 06-07-1960  Today's Date: 05/31/2011 Time: 4098-1191 Time Calculation (min): 43 min Charges: 43 MHP, 10 e-stim, 33 TE Visit#: 5  of 16   Re-eval: 06/09/11    Subjective: Symptoms/Limitations Symptoms: " I couldn't get out of bed for the past two days"  reports leg spasms that are moving right to left leg.   Pain Assessment Pain Score: 10-Worst pain ever Pain Location: Back Pain Orientation: Left Pain Type: Chronic pain  Exercise/Treatments Machine Exercises Tread Mill: 5' @ 0.7-0.8 Aerobic Tread Mill: 5' @ 0.7-0.8 Standing Gait Training: the patient did not feel up for gait training today.  Supine Quad Sets: Both;Limitations;15 reps Quad Sets Limitations: 5" holds with ab set Heel Slides: AROM;Both;Limitations Heel Slides Limitations: 12 reps with ab set Bridges: Limitations Bridges Limitations: 12 reps Other Supine Knee Exercises: hip adduction in supine against small ball 1 x 10 5" holds with ab set.    Modalities Modalities: Moist Heat Moist Heat Therapy Number Minutes Moist Heat: 10 Minutes Electrical Stimulation Electrical Stimulation Location: low back with LE Elevated  Electrical Stimulation Action: IFES hi/lo sweep Electrical Stimulation Parameters: hi/lo sweep 12-15 volts  Electrical Stimulation Goals: Pain  Physical Therapy Assessment and Plan Clinical Impression Statement: The patient seems to be progressing well, but slowly with ther ex despite reports of 10/10 pain.  She did not want to go to the emergency room today.   PT Plan: Continue with gait training , PRE for core strengthening and modalities as needed for pain.  Add sit to stand practice with ab set and ab set with bent knee raises.  Do gait training with cane instead of treadmill gait training next session.      Problem List Patient Active Problem List  Diagnoses  . BACK PAIN  . ANSERINE BURSITIS    . Bilateral knee pain  . Difficulty in walking  . Muscle weakness (generalized)   PT - End of Session Activity Tolerance: Patient limited by pain General Behavior During Session: [facility] for tasks performed Cognition: [facility] for tasks performed PT Plan of Care PT Home Exercise Plan: no new  Izayah Miner B. Malaiya Paczkowski, PT, DPT  05/31/2011, 10:09 AM

## 2011-06-01 ENCOUNTER — Telehealth (HOSPITAL_COMMUNITY): Payer: Self-pay

## 2011-06-02 ENCOUNTER — Ambulatory Visit (HOSPITAL_COMMUNITY): Payer: PRIVATE HEALTH INSURANCE | Admitting: Physical Therapy

## 2011-06-07 ENCOUNTER — Ambulatory Visit (HOSPITAL_COMMUNITY)
Admission: RE | Admit: 2011-06-07 | Discharge: 2011-06-07 | Disposition: A | Discharge status: Home / Self Care | Payer: PRIVATE HEALTH INSURANCE | Source: Ambulatory Visit | Attending: Internal Medicine | Admitting: Internal Medicine

## 2011-06-07 NOTE — Progress Notes (Addendum)
Physical Therapy Treatment Patient Details  Name: Annette Galvan MRN: [id number] Date of Birth: Jul 31, 1960  Today's Date: 06/07/2011 Time: 4098-1191 Time Calculation (min): 60 min Visit#: 6  of 16   Re-eval: 06/09/11 Charges:  Therex x 25' Gait x 5' IFC w/ heat x 15'  Subjective: Symptoms/Limitations Symptoms: [facility] Pain Assessment Currently in Pain?: Yes Pain Score:   8 Pain Location: Hip Pain Orientation: Right;Left   Exercise/Treatments Stability Ab Set: 10 reps;Limitations AB Set Limitations: 5" holds Standing Gait Training: Around department with SPC Supine Quad Sets: 10 reps Heel Slides: AROM;Both;Limitations Heel Slides Limitations: 10 on L only; unable to tolerate R Bridges: 15 reps Other Supine Knee Exercises: hip adduction in supine against ball 1 x 10 5" holds with ab set.     Modalities Modalities: Moist Heat Moist Heat Therapy Number Minutes Moist Heat: 15 Minutes Moist Heat Location: Other (comment) (Lower back) Museum/gallery exhibitions officer Stimulation Location: R knee Electrical Stimulation Action: IFES hi/lo sweep  Electrical Stimulation Parameters: hi/lo sweep 12-17 volts  Electrical Stimulation Goals: Pain  Physical Therapy Assessment and Plan PT Assessment and Plan Clinical Impression Statement: Tx limited by pain. Completed gait training with SPC with minimal need for cueing. Pt requests not to complete TM this session. IFC completed to R knee to decrease pain. PT Plan: Reassess next session.   Problem List Patient Active Problem List  Diagnoses  . BACK PAIN  . ANSERINE BURSITIS  . Bilateral knee pain  . Difficulty in walking  . Muscle weakness (generalized)    PT - End of Session Activity Tolerance: Patient limited by pain General Behavior During Session: [facility] for tasks performed Cognition: [facility] for tasks performed   Antonieta Iba 06/07/2011, 11:39 AM

## 2011-06-14 ENCOUNTER — Telehealth (HOSPITAL_COMMUNITY): Payer: Self-pay

## 2011-06-14 ENCOUNTER — Ambulatory Visit (HOSPITAL_COMMUNITY): Payer: PRIVATE HEALTH INSURANCE | Admitting: Physical Therapy

## 2011-06-16 ENCOUNTER — Ambulatory Visit (HOSPITAL_COMMUNITY): Payer: PRIVATE HEALTH INSURANCE | Admitting: Physical Therapy

## 2011-06-16 ENCOUNTER — Telehealth (HOSPITAL_COMMUNITY): Payer: Self-pay

## 2011-06-21 ENCOUNTER — Telehealth (HOSPITAL_COMMUNITY): Payer: Self-pay

## 2011-06-21 ENCOUNTER — Inpatient Hospital Stay (HOSPITAL_COMMUNITY): Admission: RE | Admit: 2011-06-21 | Payer: PRIVATE HEALTH INSURANCE | Source: Ambulatory Visit

## 2011-06-23 ENCOUNTER — Telehealth (HOSPITAL_COMMUNITY): Payer: Self-pay

## 2011-06-23 ENCOUNTER — Ambulatory Visit (HOSPITAL_COMMUNITY): Payer: PRIVATE HEALTH INSURANCE | Admitting: Physical Therapy

## 2011-07-08 ENCOUNTER — Encounter (HOSPITAL_COMMUNITY): Payer: Self-pay | Admitting: *Deleted

## 2011-07-08 ENCOUNTER — Emergency Department (HOSPITAL_COMMUNITY)
Admission: EM | Admit: 2011-07-08 | Discharge: 2011-07-08 | Discharge status: Against Medical Advice | Payer: PRIVATE HEALTH INSURANCE | Attending: Emergency Medicine | Admitting: Emergency Medicine

## 2011-07-08 DIAGNOSIS — M79609 Pain in unspecified limb: Secondary | ICD-10-CM | POA: Insufficient documentation

## 2011-07-08 NOTE — ED Notes (Signed)
bil leg pain,  Supposed to go to pain clinic for this.  Has no pain med.

## 2011-07-14 ENCOUNTER — Emergency Department (HOSPITAL_COMMUNITY)
Admission: EM | Admit: 2011-07-14 | Discharge: 2011-07-14 | Disposition: A | Discharge status: Home / Self Care | Payer: PRIVATE HEALTH INSURANCE | Attending: Emergency Medicine | Admitting: Emergency Medicine

## 2011-07-14 ENCOUNTER — Encounter (HOSPITAL_COMMUNITY): Payer: Self-pay | Admitting: *Deleted

## 2011-07-14 DIAGNOSIS — G8929 Other chronic pain: Secondary | ICD-10-CM | POA: Insufficient documentation

## 2011-07-14 DIAGNOSIS — M5430 Sciatica, unspecified side: Secondary | ICD-10-CM | POA: Insufficient documentation

## 2011-07-14 DIAGNOSIS — F17200 Nicotine dependence, unspecified, uncomplicated: Secondary | ICD-10-CM | POA: Insufficient documentation

## 2011-07-14 DIAGNOSIS — F319 Bipolar disorder, unspecified: Secondary | ICD-10-CM | POA: Insufficient documentation

## 2011-07-14 MED ORDER — OXYCODONE-ACETAMINOPHEN 5-325 MG PO TABS: 1.0000 | ORAL_TABLET | ORAL | Status: AC | PRN

## 2011-07-14 NOTE — ED Notes (Signed)
Pt DC to home with steady gait 

## 2011-07-14 NOTE — Discharge Instructions (Signed)

## 2011-07-14 NOTE — ED Notes (Signed)
Says she was told by Pain management to come here for treatment.  Pain both legs and back.

## 2011-07-15 NOTE — ED Provider Notes (Signed)
History     CSN: [id number]  Arrival date & time 07/14/11  1402   First MD Initiated Contact with Patient 07/14/11 1457      Chief Complaint  Patient presents with  . Leg Pain    (Consider location/radiation/quality/duration/timing/severity/associated sxs/prior treatment) HPI Comments: Patient has history of chronic low back pain for which she has been referred to [facility] for pain management.  Unfortunately,  Her first appointment with him is not until mid April.  She was advised to come here for pain control until she can establish care with him.  Patient is a 51 y.o. female presenting with back pain. The history is provided by the patient.  Back Pain  This is a chronic problem. The current episode started more than 1 week ago. The problem occurs constantly. The problem has been gradually worsening. The pain is associated with no known injury. The pain is present in the lumbar spine. The quality of the pain is described as stabbing and shooting. The pain radiates to the left knee and right knee. The pain is at a severity of 8/10. The pain is moderate. The symptoms are aggravated by bending, twisting and certain positions. The pain is the same all the time. Associated symptoms include leg pain. Pertinent negatives include no fever, no numbness, no abdominal pain, no bowel incontinence, no perianal numbness, no bladder incontinence, no dysuria, no paresthesias, no paresis, no tingling and no weakness. She has tried NSAIDs for the symptoms. The treatment provided no relief. Risk factors include obesity and a sedentary lifestyle.    Past Medical History  Diagnosis Date  . Bipolar 1 disorder   . Back pain, chronic   . Leg pain     Past Surgical History  Procedure Date  . Abdominal hysterectomy     No family history on file.  History  Substance Use Topics  . Smoking status: Current Everyday Smoker -- 0.5 packs/day    Types: Cigarettes  . Smokeless tobacco: Not on file  . Alcohol  Use: No    OB History    Grav Para Term Preterm Abortions TAB SAB Ect Mult Living                  Review of Systems  Constitutional: Negative for fever.  HENT: Negative for neck pain.   Respiratory: Negative for shortness of breath.   Gastrointestinal: Negative for abdominal pain and bowel incontinence.  Genitourinary: Negative for bladder incontinence, dysuria, urgency and difficulty urinating.  Musculoskeletal: Positive for back pain.  Neurological: Negative for tingling, weakness, numbness and paresthesias.    Allergies  Ampicillin  Home Medications   Current Outpatient Rx  Name Route Sig Dispense Refill  . ALPRAZOLAM 1 MG PO TABS Oral Take 1 mg by mouth 4 (four) times daily as needed. For anxiety    . CETIRIZINE HCL 10 MG PO TABS Oral Take 10 mg by mouth daily.    . OMEGA-3 FATTY ACIDS 1000 MG PO CAPS Oral Take 1 g by mouth 3 (three) times daily.    Marland Kitchen FLUTICASONE PROPIONATE 50 MCG/ACT NA SUSP Nasal Place 2 sprays into the nose daily as needed.    Marland Kitchen GABAPENTIN 400 MG PO CAPS Oral Take 400-800 mg by mouth 3 (three) times daily. Take one tablet twice daily and then 2 tablets at bedtime    . LAMOTRIGINE 100 MG PO TABS Oral Take 100 mg by mouth 2 (two) times daily.    Marland Kitchen LITHIUM CARBONATE 300 MG PO CAPS Oral  Take 600 mg by mouth at bedtime.    . ADULT MULTIVITAMIN W/MINERALS CH Oral Take 1 tablet by mouth daily.    Marland Kitchen NAPROXEN 500 MG PO TABS Oral Take 500 mg by mouth 2 (two) times daily as needed. For pain    . OMEPRAZOLE 20 MG PO CPDR Oral Take 20 mg by mouth daily.    . OXYCODONE-ACETAMINOPHEN 5-325 MG PO TABS  One po q 4-6 hrs prn pain 30 tablet 0  . OXYCODONE-ACETAMINOPHEN 5-325 MG PO TABS Oral Take 1 tablet by mouth every 4 (four) hours as needed for pain. 20 tablet 0  . TRAZODONE HCL 150 MG PO TABS Oral Take 300 mg by mouth at bedtime.      BP 134/75  Pulse 95  Temp(Src) 97.5 F (36.4 C) (Oral)  Resp 20  Ht 5\' 4"  (1.626 m)  Wt 260 lb (117.935 kg)  BMI 44.63 kg/m2   SpO2 100%  Physical Exam  Nursing note and vitals reviewed. Constitutional: She is oriented to person, place, and time. She appears well-developed and well-nourished.  HENT:  Head: Normocephalic.  Eyes: Conjunctivae are normal.  Neck: Normal range of motion. Neck supple.  Cardiovascular: Regular rhythm and intact distal pulses.        Pedal pulses normal.  Pulmonary/Chest: Effort normal. She has no wheezes.  Abdominal: Soft. Bowel sounds are normal. She exhibits no distension and no mass.  Musculoskeletal: Normal range of motion. She exhibits no edema.       Lumbar back: She exhibits tenderness. She exhibits no swelling, no edema and no spasm.  Neurological: She is alert and oriented to person, place, and time. She has normal strength. She displays no atrophy and no tremor. No cranial nerve deficit or sensory deficit. Gait normal.  Reflex Scores:      Patellar reflexes are 2+ on the right side and 2+ on the left side.      Achilles reflexes are 2+ on the right side and 2+ on the left side.      No strength deficit noted in hip and knee flexor and extensor muscle groups.  Ankle flexion and extension intact.  Skin: Skin is warm and dry.  Psychiatric: She has a normal mood and affect.    ED Course  Procedures (including critical care time)  Labs Reviewed - No data to display No results found.   1. Sciatica       MDM  Oxycodone,  Heat,  Avoid lifting,  Bending or any activity that worsens pain.  Advised to get rechecked immediately for any bowel or urinary changes,  Weakness in legs.  F/u with Dr. Murray Hodgkins as scheduled.  The patient appears reasonably screened and/or stabilized for discharge and I doubt any other medical condition or other [facility] requiring further screening, evaluation, or treatment in the ED at this time prior to discharge.         Candis Musa, PA 07/15/11 2150

## 2011-07-20 NOTE — ED Provider Notes (Signed)
Medical screening examination/treatment/procedure(s) were performed by non-physician practitioner and as supervising physician I was immediately available for consultation/collaboration.   Joya Gaskins, MD 07/20/11 1426

## 2011-08-17 ENCOUNTER — Ambulatory Visit: Payer: PRIVATE HEALTH INSURANCE | Admitting: Family Medicine

## 2011-08-29 ENCOUNTER — Ambulatory Visit (INDEPENDENT_AMBULATORY_CARE_PROVIDER_SITE_OTHER): Payer: PRIVATE HEALTH INSURANCE | Admitting: Family Medicine

## 2011-08-29 ENCOUNTER — Encounter: Payer: Self-pay | Admitting: Family Medicine

## 2011-08-29 VITALS — BP 120/88 | HR 100 | Resp 16 | Ht 65.5 in | Wt 260.0 lb

## 2011-08-29 DIAGNOSIS — R7309 Other abnormal glucose: Secondary | ICD-10-CM

## 2011-08-29 DIAGNOSIS — R7303 Prediabetes: Secondary | ICD-10-CM | POA: Insufficient documentation

## 2011-08-29 DIAGNOSIS — M25562 Pain in left knee: Secondary | ICD-10-CM

## 2011-08-29 DIAGNOSIS — M549 Dorsalgia, unspecified: Secondary | ICD-10-CM

## 2011-08-29 DIAGNOSIS — M6281 Muscle weakness (generalized): Secondary | ICD-10-CM

## 2011-08-29 DIAGNOSIS — F319 Bipolar disorder, unspecified: Secondary | ICD-10-CM | POA: Insufficient documentation

## 2011-08-29 DIAGNOSIS — R262 Difficulty in walking, not elsewhere classified: Secondary | ICD-10-CM

## 2011-08-29 DIAGNOSIS — M25561 Pain in right knee: Secondary | ICD-10-CM

## 2011-08-29 DIAGNOSIS — M25569 Pain in unspecified knee: Secondary | ICD-10-CM

## 2011-08-29 MED ORDER — FLUTICASONE PROPIONATE 50 MCG/ACT NA SUSP: 2.0000 | Freq: Every day | NASAL | Status: DC | PRN

## 2011-08-29 MED ORDER — CETIRIZINE HCL 10 MG PO TABS: 10.0000 mg | ORAL_TABLET | Freq: Every day | ORAL | Status: DC

## 2011-08-29 MED ORDER — NAPROXEN 500 MG PO TABS: 500.0000 mg | ORAL_TABLET | Freq: Two times a day (BID) | ORAL | Status: DC | PRN

## 2011-08-29 MED ORDER — OMEPRAZOLE 20 MG PO CPDR: 20.0000 mg | DELAYED_RELEASE_CAPSULE | Freq: Every day | ORAL | Status: DC

## 2011-08-29 NOTE — Assessment & Plan Note (Addendum)
I'm sure there is a lot of pain secondary to her weight gain. I did encouraged any activities such as range of motion with her lower extremities at home. She's also going to look into water aerobics. I will obtain records from her orthopedic surgeon he did knee injections. Given refill on NSAIDS

## 2011-08-29 NOTE — Assessment & Plan Note (Signed)
Reviewed last physical therapy note, her activity is limited by pain therefore difficult to assess

## 2011-08-29 NOTE — Assessment & Plan Note (Signed)
Obtain records from GYN and labs, no meds currently

## 2011-08-29 NOTE — Assessment & Plan Note (Signed)
Followed by [facility]. Of note she took herself off of lithium and states she notified physician. She will followup with him in July. She's been off this medication for one month. No change the other medications

## 2011-08-29 NOTE — Patient Instructions (Signed)
Continue your current medications I will get records from your previous physicians F/U 6 weeks

## 2011-08-29 NOTE — Assessment & Plan Note (Addendum)
She is generalized weakness mostly in the lower extremities. This is being worked up by pain management. Her MRI reviewed did not show any evidence of true nerve involvement. I wonder if her muscle weakness and pain is more fibromyalgia, especially in setting of her mood disorder and depression

## 2011-08-29 NOTE — Assessment & Plan Note (Signed)
Discussed importance of change in diet until she is able to increase her acitivty

## 2011-08-29 NOTE — Assessment & Plan Note (Signed)
She has some degenerative disc disease noted on MRI from 2012. Will refer to pain management

## 2011-08-29 NOTE — Progress Notes (Signed)
  Subjective:    Patient ID: Annette Galvan, female    DOB: 08-Jan-1961, 51 y.o.   MRN: [id number]  HPI Patient here to establish care. Previous PCP Dr. Felecia Shelling. Psych-[facility] Dr. Duayne Cal Neurosurgeon Dr. Mikal Plane GYN-[facility] Dr. Murray Hodgkins Medications and history reviewed  Patient's not taking her previous PCP in 6 months. She presents to establish care. Her main concern is her chronic back and leg pain. She's been having this worked up for the past few months. She was seen by neurosurgery an MRI was obtained which showed degenerative disc disease and she is also diagnosis sciatica. She is currently on Naprosyn and Neurontin. She was seen in the emergency room and was given Percocet for pain. She's had epidural injections in the back in the past. She is now under the care of a pain clinic in a regular workup for possible rheumatoid arthritis. The pain physician is currently not prescribing any narcotics. She also has chronic knee pain and has had cortisone shots in her knees before. She thinks most of her pain is due to her weight gain. She is gaining 30 pounds over the past 2 years which she thinks is secondary to psychiatric medications lithium and Seroquel. She was told by her neurosurgeon that her findings on MRI is not causing her severe pain. She is difficult days where she is unable to get of bed and states that often she urinates in the bed because she cannot move. She would like to be more active but has pain all over.  She's had blood work recently [facility] including cholesterol panel and diabetes check and she was told she has borderline.  She's been followed by mental health for the past 6 years for bipolar disorder and severe anxiety. She has many social issues and states her family does not get along. She has 2 daughters and a 21-month-old grandson baby who she wants to help care for.  Pt has silicone breast on right side therefore has pain with Mammograms and has  declined recently Review of Systems  GEN- denies fatigue, fever, weight loss,weakness, recent illness HEENT- denies eye drainage, change in vision, nasal discharge, CVS- denies chest pain, palpitations RESP- denies SOB, cough, wheeze ABD- denies N/V, change in stools, abd pain GU- denies dysuria, hematuria, dribbling, incontinence MSK- + joint pain, +muscle aches, injury Neuro- denies headache, dizziness, syncope, seizure activity       Objective:   Physical Exam GEN- NAD, alert and oriented x3 HEENT- PERRL, EOMI, non injected sclera, pink conjunctiva, MMM, oropharynx clear Neck- Supple, no thyromegaly, no bruit CVS- RRR, no murmur RESP-CTAB ABD-NABS,soft, NT,ND EXT- No edema Pulses- Radial, DP- 2+ MSK- TTP all over lower ext , decreased strength in lower ext compared to upper ext, antalgic stiff gait  Psych- depressed appearing,crying during interview, no apparent Halluciantions, normal thought process, good eye contact, no apparent SI       Assessment & Plan:

## 2011-09-26 ENCOUNTER — Emergency Department (HOSPITAL_COMMUNITY)
Admission: EM | Admit: 2011-09-26 | Discharge: 2011-09-26 | Disposition: A | Discharge status: Home / Self Care | Payer: PRIVATE HEALTH INSURANCE | Attending: Emergency Medicine | Admitting: Emergency Medicine

## 2011-09-26 ENCOUNTER — Encounter (HOSPITAL_COMMUNITY): Payer: Self-pay

## 2011-09-26 ENCOUNTER — Emergency Department (HOSPITAL_COMMUNITY): Payer: PRIVATE HEALTH INSURANCE

## 2011-09-26 DIAGNOSIS — F319 Bipolar disorder, unspecified: Secondary | ICD-10-CM | POA: Insufficient documentation

## 2011-09-26 DIAGNOSIS — M25569 Pain in unspecified knee: Secondary | ICD-10-CM

## 2011-09-26 DIAGNOSIS — F17200 Nicotine dependence, unspecified, uncomplicated: Secondary | ICD-10-CM | POA: Insufficient documentation

## 2011-09-26 DIAGNOSIS — G8929 Other chronic pain: Secondary | ICD-10-CM | POA: Insufficient documentation

## 2011-09-26 DIAGNOSIS — M549 Dorsalgia, unspecified: Secondary | ICD-10-CM | POA: Insufficient documentation

## 2011-09-26 MED ORDER — OXYCODONE-ACETAMINOPHEN 5-325 MG PO TABS: 1.0000 | ORAL_TABLET | ORAL | Status: AC | PRN

## 2011-09-26 MED ORDER — OXYCODONE-ACETAMINOPHEN 5-325 MG PO TABS
1.0000 | ORAL_TABLET | Freq: Once | ORAL | Status: AC
  Administered 2011-09-26: 1 via ORAL
  Filled 2011-09-26: qty 1

## 2011-09-26 NOTE — ED Notes (Signed)
Patient transported to X-ray 

## 2011-09-26 NOTE — ED Notes (Signed)
bil knee pain .  Chronic Has been to Pain management. But now is to go to ortho again for eval.  Says she is unable to walk and is tearful. No recent injury known

## 2011-09-26 NOTE — Discharge Instructions (Signed)
Knee Pain The knee is the complex joint between your thigh and your lower leg. It is made up of bones, tendons, ligaments, and cartilage. The bones that make up the knee are:  The femur in the thigh.   The tibia and fibula in the lower leg.   The patella or kneecap riding in the groove on the lower femur.  CAUSES  Knee pain is a common complaint with many causes. A few of these causes are:  Injury, such as:   A ruptured ligament or tendon injury.   Torn cartilage.   Medical conditions, such as:   Gout   Arthritis   Infections   Overuse, over training or overdoing a physical activity.  Knee pain can be minor or severe. Knee pain can accompany debilitating injury. Minor knee problems often respond well to self-care measures or get well on their own. More serious injuries may need medical intervention or even surgery. SYMPTOMS The knee is complex. Symptoms of knee problems can vary widely. Some of the problems are:  Pain with movement and weight bearing.   Swelling and tenderness.   Buckling of the knee.   Inability to straighten or extend your knee.   Your knee locks and you cannot straighten it.   Warmth and redness with pain and fever.   Deformity or dislocation of the kneecap.  DIAGNOSIS  Determining what is wrong may be very straight forward such as when there is an injury. It can also be challenging because of the complexity of the knee. Tests to make a diagnosis may include:  Your caregiver taking a history and doing a physical exam.   Routine X-rays can be used to rule out other problems. X-rays will not reveal a cartilage tear. Some injuries of the knee can be diagnosed by:   Arthroscopy a surgical technique by which a small video camera is inserted through tiny incisions on the sides of the knee. This procedure is used to examine and repair internal knee joint problems. Tiny instruments can be used during arthroscopy to repair the torn knee cartilage  (meniscus).   Arthrography is a radiology technique. A contrast liquid is directly injected into the knee joint. Internal structures of the knee joint then become visible on X-ray film.   An MRI scan is a non x-ray radiology procedure in which magnetic fields and a computer produce two- or three-dimensional images of the inside of the knee. Cartilage tears are often visible using an MRI scanner. MRI scans have largely replaced arthrography in diagnosing cartilage tears of the knee.   Blood work.   Examination of the fluid that helps to lubricate the knee joint (synovial fluid). This is done by taking a sample out using a needle and a syringe.  TREATMENT The treatment of knee problems depends on the cause. Some of these treatments are:  Depending on the injury, proper casting, splinting, surgery or physical therapy care will be needed.   Give yourself adequate recovery time. Do not overuse your joints. If you begin to get sore during workout routines, back off. Slow down or do fewer repetitions.   For repetitive activities such as cycling or running, maintain your strength and nutrition.   Alternate muscle groups. For example if you are a weight lifter, work the upper body on one day and the lower body the next.   Either tight or weak muscles do not give the proper support for your knee. Tight or weak muscles do not absorb the stress placed   on the knee joint. Keep the muscles surrounding the knee strong.   Take care of mechanical problems.   If you have flat feet, orthotics or special shoes may help. See your caregiver if you need help.   Arch supports, sometimes with wedges on the inner or outer aspect of the heel, can help. These can shift pressure away from the side of the knee most bothered by osteoarthritis.   A brace called an "unloader" brace also may be used to help ease the pressure on the most arthritic side of the knee.   If your caregiver has prescribed crutches, braces,  wraps or ice, use as directed. The acronym for this is PRICE. This means protection, rest, ice, compression and elevation.   Nonsteroidal anti-inflammatory drugs (NSAID's), can help relieve pain. But if taken immediately after an injury, they may actually increase swelling. Take NSAID's with food in your stomach. Stop them if you develop stomach problems. Do not take these if you have a history of ulcers, stomach pain or bleeding from the bowel. Do not take without your caregiver's approval if you have problems with fluid retention, heart failure, or kidney problems.   For ongoing knee problems, physical therapy may be helpful.   Glucosamine and chondroitin are over-the-counter dietary supplements. Both may help relieve the pain of osteoarthritis in the knee. These medicines are different from the usual anti-inflammatory drugs. Glucosamine may decrease the rate of cartilage destruction.   Injections of a corticosteroid drug into your knee joint may help reduce the symptoms of an arthritis flare-up. They may provide pain relief that lasts a few months. You may have to wait a few months between injections. The injections do have a small increased risk of infection, water retention and elevated blood sugar levels.   Hyaluronic acid injected into damaged joints may ease pain and provide lubrication. These injections may work by reducing inflammation. A series of shots may give relief for as long as 6 months.   Topical painkillers. Applying certain ointments to your skin may help relieve the pain and stiffness of osteoarthritis. Ask your pharmacist for suggestions. Many over the-counter products are approved for temporary relief of arthritis pain.   In some countries, doctors often prescribe topical NSAID's for relief of chronic conditions such as arthritis and tendinitis. A review of treatment with NSAID creams found that they worked as well as oral medications but without the serious side effects.    PREVENTION  Maintain a healthy weight. Extra pounds put more strain on your joints.   Get strong, stay limber. Weak muscles are a common cause of knee injuries. Stretching is important. Include flexibility exercises in your workouts.   Be smart about exercise. If you have osteoarthritis, chronic knee pain or recurring injuries, you may need to change the way you exercise. This does not mean you have to stop being active. If your knees ache after jogging or playing basketball, consider switching to swimming, water aerobics or other low-impact activities, at least for a few days a week. Sometimes limiting high-impact activities will provide relief.   Make sure your shoes fit well. Choose footwear that is right for your sport.   Protect your knees. Use the proper gear for knee-sensitive activities. Use kneepads when playing volleyball or laying carpet. Buckle your seat belt every time you drive. Most shattered kneecaps occur in car accidents.   Rest when you are tired.  SEEK MEDICAL CARE IF:  You have knee pain that is continual and does not   seem to be getting better.  SEEK IMMEDIATE MEDICAL CARE IF:  Your knee joint feels hot to the touch and you have a high fever. MAKE SURE YOU:   Understand these instructions.   Will watch your condition.   Will get help right away if you are not doing well or get worse.  Document Released: 01/30/2007 Document Revised: 03/24/2011 Document Reviewed: 01/30/2007 [employer] Patient Information 2012 [employer]. 

## 2011-09-26 NOTE — ED Notes (Signed)
Pt reports has chronic knee pain.  REports pain has been unbearable for the past 2 weeks.

## 2011-09-28 NOTE — ED Provider Notes (Signed)
History     CSN: [id number]  Arrival date & time 09/26/11  1254   First MD Initiated Contact with Patient 09/26/11 1328      Chief Complaint  Patient presents with  . Knee Pain    (Consider location/radiation/quality/duration/timing/severity/associated sxs/prior treatment) Patient is a 51 y.o. female presenting with knee pain. The history is provided by the patient.  Knee Pain This is a chronic problem. The current episode started 1 to 4 weeks ago. The problem occurs constantly. The problem has been gradually worsening. Associated symptoms include arthralgias. Pertinent negatives include no chills, fever, headaches, joint swelling, neck pain, numbness, urinary symptoms, vomiting or weakness. The symptoms are aggravated by standing, twisting, walking and bending. She has tried NSAIDs for the symptoms. The treatment provided no relief.    Past Medical History  Diagnosis Date  . Bipolar 1 disorder   . Back pain, chronic   . Leg pain     Past Surgical History  Procedure Date  . Abdominal hysterectomy     Family History  Problem Relation Age of Onset  . Hypertension Mother   . Diabetes Mother   . Depression Mother   . Hyperlipidemia Mother   . Cancer Father   . Depression Father   . Hyperlipidemia Father   . Hypertension Father   . Depression Sister   . Hyperlipidemia Sister     History  Substance Use Topics  . Smoking status: Current Everyday Smoker -- 0.3 packs/day    Types: Cigarettes  . Smokeless tobacco: Not on file  . Alcohol Use: No    OB History    Grav Para Term Preterm Abortions TAB SAB Ect Mult Living                  Review of Systems  Constitutional: Negative for fever and chills.  HENT: Negative for neck pain.   Gastrointestinal: Negative for vomiting.  Genitourinary: Negative for dysuria and difficulty urinating.  Musculoskeletal: Positive for arthralgias. Negative for back pain and joint swelling.  Skin: Negative for color change and wound.   Neurological: Negative for weakness, numbness and headaches.  All other systems reviewed and are negative.    Allergies  Ampicillin  Home Medications   Current Outpatient Rx  Name Route Sig Dispense Refill  . ALPRAZOLAM 1 MG PO TABS Oral Take 1 mg by mouth 4 (four) times daily as needed. For anxiety    . CETIRIZINE HCL 10 MG PO TABS Oral Take 10 mg by mouth daily as needed. For allergies    . GABAPENTIN 400 MG PO CAPS Oral Take 400 mg by mouth 3 (three) times daily. Takes along with 600 mg to equal 1000 mg    . GABAPENTIN 600 MG PO TABS Oral Take 600 mg by mouth 3 (three) times daily. Takes with 400 mg to equal 1000 mg    . LAMOTRIGINE 100 MG PO TABS Oral Take 100 mg by mouth 2 (two) times daily.    Marland Kitchen NAPROXEN 500 MG PO TABS Oral Take 1 tablet (500 mg total) by mouth 2 (two) times daily as needed. For pain 60 tablet 3  . OMEPRAZOLE 20 MG PO CPDR Oral Take 1 capsule (20 mg total) by mouth daily. 30 capsule 3  . TRAZODONE HCL 150 MG PO TABS Oral Take 300 mg by mouth at bedtime.    . OXYCODONE-ACETAMINOPHEN 5-325 MG PO TABS Oral Take 1 tablet by mouth every 4 (four) hours as needed for pain. 24 tablet 0  BP 143/87  Pulse 83  Temp 97.6 F (36.4 C) (Oral)  Resp 18  Ht 5\' 5"  (1.651 m)  Wt 275 lb (124.739 kg)  BMI 45.76 kg/m2  SpO2 98%  Physical Exam  Nursing note and vitals reviewed. Constitutional: She is oriented to person, place, and time. She appears well-developed and well-nourished. No distress.       Morbidly obese  Cardiovascular: Normal rate, regular rhythm, normal heart sounds and intact distal pulses.   Pulmonary/Chest: Effort normal and breath sounds normal.  Musculoskeletal: She exhibits tenderness. She exhibits no edema.       ttp of the bilateral knees.  Right > left.  Mild crepitus.  No erythema, bruising, effusion or deformity.  DP pulses are equal and brisk bilat  Neurological: She is alert and oriented to person, place, and time. No cranial nerve deficit or  sensory deficit. She exhibits normal muscle tone. Coordination normal.  Reflex Scores:      Patellar reflexes are 2+ on the right side and 2+ on the left side.      Achilles reflexes are 2+ on the right side and 2+ on the left side. Skin: Skin is warm and dry. No erythema.    ED Course  Procedures (including critical care time)  Labs Reviewed - No data to display  Dg Knee Complete 4 Views Left  09/26/2011  *RADIOLOGY REPORT*  Clinical Data: Knee pain.  LEFT KNEE - COMPLETE 4+ VIEW  Comparison: None.  Findings: Four views study shows no fracture.  No subluxation or dislocation.  There is loss of joint space in medial compartment. Mild hypertrophic spurring is visible in all three compartments but is most advanced medially.  No evidence for joint effusion.  IMPRESSION: Degenerative changes without acute bony findings.  Original Report Authenticated By: ERIC A. MANSELL, M.D.   Dg Knee Complete 4 Views Right  09/26/2011  *RADIOLOGY REPORT*  Clinical Data: Knee pain.  RIGHT KNEE - COMPLETE 4+ VIEW  Comparison: 07/25/2008.  Findings: Moderate tricompartmental osteoarthritis is present, most severe in the medial compartment.  Medial joint space narrowing appears slightly more prominent than on prior exam of 2010.  Mild varus deformity.  IMPRESSION: Moderate tricompartmental osteoarthritis worst in the medial compartment.  Progression compared to 07/25/2008.  Original Report Authenticated By: Andreas Newport, M.D.    1. Knee pain     Knee immobilizer applied to right knee, pain improved, remains NV intact  MDM    Previous medical charts, nursing notes and vitals signs from this visit were reviewed by me   All laboratory results and/or imaging results performed on this visit, if applicable, were reviewed by me and discussed with the patient and/or parent as well as recommendation for follow-up    MEDICATIONS GIVEN IN ED:  Percocet po  Patient with morbid obesity, recent weight gain and  increased bilateral knee pain.  Likely secondary to the weight gain.  No effusion, erythema, or deformity to either knee.  Pain is greater on right.  Increased deg change as noted from previous plain film of the same knee.  Pt agrees to f/u with her orthopedist.  Modification of diet and wt loss encouraged.      PRESCRIPTIONS GIVEN AT DISCHARGE:  Percocet #24    Pt stable in ED with no significant deterioration in condition. Pt feels improved after observation and/or treatment in ED. Patient / Family / Caregiver understand and agree with initial ED impression and plan with expectations set for ED visit.  Patient  agrees to return to ED for any worsening symptoms          Arlenis Blaydes L. Samaa Ueda, [place] 09/28/11 1610

## 2011-09-30 NOTE — ED Provider Notes (Signed)
Medical screening examination/treatment/procedure(s) were performed by non-physician practitioner and as supervising physician I was immediately available for consultation/collaboration.  Toy Baker, MD 09/30/11 [phone]

## 2011-10-10 ENCOUNTER — Ambulatory Visit (INDEPENDENT_AMBULATORY_CARE_PROVIDER_SITE_OTHER): Payer: PRIVATE HEALTH INSURANCE | Admitting: Family Medicine

## 2011-10-10 ENCOUNTER — Encounter: Payer: Self-pay | Admitting: Family Medicine

## 2011-10-10 VITALS — BP 132/90 | HR 83 | Resp 16 | Wt 266.1 lb

## 2011-10-10 DIAGNOSIS — M25569 Pain in unspecified knee: Secondary | ICD-10-CM

## 2011-10-10 DIAGNOSIS — M549 Dorsalgia, unspecified: Secondary | ICD-10-CM

## 2011-10-10 DIAGNOSIS — M25562 Pain in left knee: Secondary | ICD-10-CM

## 2011-10-10 DIAGNOSIS — R7309 Other abnormal glucose: Secondary | ICD-10-CM

## 2011-10-10 DIAGNOSIS — M797 Fibromyalgia: Secondary | ICD-10-CM

## 2011-10-10 DIAGNOSIS — R262 Difficulty in walking, not elsewhere classified: Secondary | ICD-10-CM

## 2011-10-10 DIAGNOSIS — M25561 Pain in right knee: Secondary | ICD-10-CM

## 2011-10-10 DIAGNOSIS — R7303 Prediabetes: Secondary | ICD-10-CM

## 2011-10-10 DIAGNOSIS — IMO0001 Reserved for inherently not codable concepts without codable children: Secondary | ICD-10-CM

## 2011-10-10 NOTE — Patient Instructions (Signed)
I will get records from [facility] I will order the rollator/ lift chair  Continue current medications Congratulations on the weight loss! Continue to work on the smoking  F/U 4 months

## 2011-10-10 NOTE — Progress Notes (Signed)
  Subjective:    Patient ID: Annette Galvan, female    DOB: [birth year], 51 y.o.   MRN: [id number]  HPI Pt here for intermin visit, GYN labs reviewed She is being followed by [facility]- Dr. Valentina Gu orthopedics- she is no longer following with Dr. Carolyn Stare at the pain clinic. She was referred back to work orthopedics for her knees and was told that she would need a knee replacement later in life at this time they're going to start "Gel" injections into the knees. She's not on narcotic medications. She is asking for a lift chair and a role later to help her ambulate. At times she still cannot get out of the chair or the bed. She has lost 9 pounds. A home health nurse associated with her Medicare came to the home to evaluate her. She was also seen by her neurosurgeon Dr. Mikal Plane and was told there was no surgical intervention needed for her back pain.   Review of Systems    GEN- denies fatigue, fever, weight loss,weakness, recent illness HEENT- denies eye drainage, change in vision, nasal discharge, CVS- denies chest pain, palpitations RESP- denies SOB, cough, wheeze ABD- denies N/V, change in stools, abd pain GU- denies dysuria, hematuria, dribbling, incontinence MSK- + joint pain, +muscle aches, injury Neuro- denies headache, dizziness, syncope, seizure activity    Objective:   Physical Exam  GEN- NAD, alert and oriented x3, obese CVS- RRR, no murmur RESP-CTAB EXT- No edema Pulses- Radial, DP- 2+ MSK- TTP all over lower ext  antalgic stiff gait        Assessment & Plan:

## 2011-10-11 ENCOUNTER — Telehealth: Payer: Self-pay | Admitting: Family Medicine

## 2011-10-11 ENCOUNTER — Encounter: Payer: Self-pay | Admitting: Family Medicine

## 2011-10-11 DIAGNOSIS — M797 Fibromyalgia: Secondary | ICD-10-CM | POA: Insufficient documentation

## 2011-10-11 NOTE — Assessment & Plan Note (Addendum)
Weight down 5lbs  She has Sleep study pending set up by [facility]

## 2011-10-11 NOTE — Assessment & Plan Note (Signed)
Continue neurontin, I do not think she needs rheumatology at this time, this was an error pt stated rheumatology but has not seen them only pain management

## 2011-10-11 NOTE — Assessment & Plan Note (Signed)
Records pending, seen by neurosurgery, no intervention, on gabapentin

## 2011-10-11 NOTE — Assessment & Plan Note (Signed)
No A1C done, her glucose was normal

## 2011-10-11 NOTE — Assessment & Plan Note (Signed)
Obtain records from orthopedics

## 2011-10-11 NOTE — Assessment & Plan Note (Signed)
Assisted device given. She is very motivated to walk with this.

## 2011-10-12 NOTE — Telephone Encounter (Signed)
Please advise 

## 2011-10-12 NOTE — Telephone Encounter (Signed)
New script written.

## 2011-10-13 ENCOUNTER — Telehealth: Payer: Self-pay | Admitting: Family Medicine

## 2011-10-13 NOTE — Telephone Encounter (Signed)
Already done

## 2011-10-13 NOTE — Telephone Encounter (Signed)
[facility] states they received this yesterday

## 2011-10-13 NOTE — Telephone Encounter (Signed)
This has been sent already to [facility], please call and confirm

## 2011-10-13 NOTE — Telephone Encounter (Signed)
Patient aware.

## 2011-10-13 NOTE — Telephone Encounter (Signed)
Didn't you change this already for her?

## 2011-10-28 ENCOUNTER — Encounter (HOSPITAL_COMMUNITY): Payer: Self-pay

## 2011-10-28 ENCOUNTER — Emergency Department (HOSPITAL_COMMUNITY)
Admission: EM | Admit: 2011-10-28 | Discharge: 2011-10-28 | Disposition: A | Discharge status: Home / Self Care | Payer: PRIVATE HEALTH INSURANCE | Attending: Emergency Medicine | Admitting: Emergency Medicine

## 2011-10-28 DIAGNOSIS — F319 Bipolar disorder, unspecified: Secondary | ICD-10-CM | POA: Insufficient documentation

## 2011-10-28 DIAGNOSIS — G8929 Other chronic pain: Secondary | ICD-10-CM | POA: Insufficient documentation

## 2011-10-28 DIAGNOSIS — F17200 Nicotine dependence, unspecified, uncomplicated: Secondary | ICD-10-CM | POA: Insufficient documentation

## 2011-10-28 DIAGNOSIS — M25569 Pain in unspecified knee: Secondary | ICD-10-CM | POA: Insufficient documentation

## 2011-10-28 HISTORY — DX: Fibromyalgia: M79.7

## 2011-10-28 HISTORY — DX: Other chronic pain: G89.29

## 2011-10-28 HISTORY — DX: Pain in right knee: M25.561

## 2011-10-28 HISTORY — DX: Pain in left knee: M25.562

## 2011-10-28 MED ORDER — OXYCODONE-ACETAMINOPHEN 5-325 MG PO TABS: ORAL_TABLET | ORAL | Status: AC

## 2011-10-28 MED ORDER — OXYCODONE-ACETAMINOPHEN 5-325 MG PO TABS
2.0000 | ORAL_TABLET | Freq: Once | ORAL | Status: AC
  Administered 2011-10-28: 2 via ORAL
  Filled 2011-10-28: qty 2

## 2011-10-28 NOTE — ED Notes (Signed)
Pt reports has arthritis in both knees but today pain in R knee has gotten worse.  Denies injury.

## 2011-10-28 NOTE — ED Provider Notes (Signed)
History     CSN: [id number]  Arrival date & time 10/28/11  1424   First MD Initiated Contact with Patient 10/28/11 1537      Chief Complaint  Patient presents with  . Knee Pain    HPI Pt was seen at 1540.  Per pt, c/o gradual onset and persistence of constant acute flair of her chronic right knee "pain" for the past several weeks.  Pt states the pain began after she ran out of percocet and is requesting refill of same.  Describes the pain as per her usual longstanding chronic pain pattern.  Denies new injury, no focal motor weakness, no tingling/numbness in extremities, no edema, no erythema, no fevers.  The symptoms have been associated with no other complaints. The patient has a significant history of similar symptoms previously, recently being evaluated for this complaint and multiple prior evals for same.      Past Medical History  Diagnosis Date  . Bipolar 1 disorder   . Back pain, chronic   . Leg pain   . HSV-2 (herpes simplex virus 2) infection 2013  . Bilateral chronic knee pain   . Fibromyalgia     Past Surgical History  Procedure Date  . Abdominal hysterectomy     Family History  Problem Relation Age of Onset  . Hypertension Mother   . Diabetes Mother   . Depression Mother   . Hyperlipidemia Mother   . Cancer Father   . Depression Father   . Hyperlipidemia Father   . Hypertension Father   . Depression Sister   . Hyperlipidemia Sister     History  Substance Use Topics  . Smoking status: Current Everyday Smoker -- 0.3 packs/day    Types: Cigarettes  . Smokeless tobacco: Not on file  . Alcohol Use: No    Review of Systems ROS: Statement: All systems negative except as marked or noted in the HPI; Constitutional: Negative for fever and chills. ; ; Eyes: Negative for eye pain, redness and discharge. ; ; ENMT: Negative for ear pain, hoarseness, nasal congestion, sinus pressure and sore throat. ; ; Cardiovascular: Negative for chest pain, palpitations,  diaphoresis, dyspnea and peripheral edema. ; ; Respiratory: Negative for cough, wheezing and stridor. ; ; Gastrointestinal: Negative for nausea, vomiting, diarrhea, abdominal pain, blood in stool, hematemesis, jaundice and rectal bleeding. . ; ; Genitourinary: Negative for dysuria, flank pain and hematuria. ; ; Musculoskeletal: +knee pain. Negative for back pain and neck pain. Negative for swelling and trauma.; ; Skin: Negative for pruritus, rash, abrasions, blisters, bruising and skin lesion.; ; Neuro: Negative for headache, lightheadedness and neck stiffness. Negative for weakness, altered level of consciousness , altered mental status, extremity weakness, paresthesias, involuntary movement, seizure and syncope.     Allergies  Ampicillin  Home Medications   Current Outpatient Rx  Name Route Sig Dispense Refill  . ALPRAZOLAM 1 MG PO TABS Oral Take 1 mg by mouth 4 (four) times daily as needed. For anxiety    . CETIRIZINE HCL 10 MG PO TABS Oral Take 10 mg by mouth daily as needed. For allergies    . CYCLOBENZAPRINE HCL 10 MG PO TABS Oral Take 10 mg by mouth 3 (three) times daily as needed.    Marland Kitchen GABAPENTIN 400 MG PO CAPS Oral Take 400 mg by mouth 3 (three) times daily. Takes along with 600 mg to equal 1000 mg    . GABAPENTIN 600 MG PO TABS Oral Take 600 mg by mouth 3 (three) times  daily. Takes with 400 mg to equal 1000 mg    . IBUPROFEN 200 MG PO TABS Oral Take 200 mg by mouth 3 (three) times daily as needed.    Marland Kitchen LAMOTRIGINE 100 MG PO TABS Oral Take 100 mg by mouth 2 (two) times daily.    Marland Kitchen OMEPRAZOLE 20 MG PO CPDR Oral Take 1 capsule (20 mg total) by mouth daily. 30 capsule 3  . TRAZODONE HCL 150 MG PO TABS Oral Take 300 mg by mouth at bedtime.    . ACETAMINOPHEN 325 MG PO TABS Oral Take 650 mg by mouth 2 (two) times daily as needed.      BP 120/88  Pulse 96  Temp 98.3 F (36.8 C) (Oral)  Resp 18  Ht 5\' 5"  (1.651 m)  Wt 263 lb (119.296 kg)  BMI 43.77 kg/m2  SpO2 96%  Physical  Exam 1545: Physical examination:  Nursing notes reviewed; Vital signs and O2 SAT reviewed;  Constitutional: Well developed, Well nourished, Well hydrated, In no acute distress; Head:  Normocephalic, atraumatic; Eyes: EOMI, PERRL, No scleral icterus; ENMT: Mouth and pharynx normal, Mucous membranes moist; Neck: Supple, Full range of motion, No lymphadenopathy; Cardiovascular: Regular rate and rhythm, No murmur, rub, or gallop; Respiratory: Breath sounds clear & equal bilaterally, No rales, rhonchi, wheezes.  Speaking full sentences with ease, Normal respiratory effort/excursion; Chest: Nontender, Movement normal;; Extremities: Pulses normal, +FROM right knee, including able to lift extended RLE off stretcher, and extend right lower leg against resistance.  No ligamentous laxity.  No patellar or quad tendon step-offs.  NMS intact right foot, strong pedal pp.  No proximal fibular head tenderness.  No edema, erythema, warmth, ecchymosis, deformity or open wounds.  No specific area of point tenderness. No calf edema or asymmetry.; Neuro: AA&Ox3, Major CN grossly intact.  Speech clear. No gross focal motor or sensory deficits in extremities.; Skin: Color normal, Warm, Dry.   ED Course  Procedures     MDM  MDM Reviewed: previous chart, nursing note and vitals Reviewed previous: x-ray     3:44 PM:  Long hx of chronic pain with multiple ED visits for same.  Pt endorses acute flair of her usual long standing chronic pain today, no change from her usual chronic pain pattern.  Pt has not been walking with her knee immobilizer, using her crutches or walker, as per instructions by her Ortho Dr. Sherlean Foot.  Pt encouraged to wear her knee immobilizer, use either her crutches or walker, as previously instructed; and to f/u with her PMD, Pain Management and Orthopedic doctors for good continuity of care and control of her chronic pain.  Verb understanding.          Laray Anger, DO 10/29/11 1446

## 2011-12-08 ENCOUNTER — Encounter (HOSPITAL_COMMUNITY): Payer: Self-pay

## 2011-12-08 ENCOUNTER — Emergency Department (HOSPITAL_COMMUNITY)
Admission: EM | Admit: 2011-12-08 | Discharge: 2011-12-08 | Disposition: A | Discharge status: Home / Self Care | Payer: PRIVATE HEALTH INSURANCE | Attending: Emergency Medicine | Admitting: Emergency Medicine

## 2011-12-08 DIAGNOSIS — Z833 Family history of diabetes mellitus: Secondary | ICD-10-CM | POA: Insufficient documentation

## 2011-12-08 DIAGNOSIS — F17200 Nicotine dependence, unspecified, uncomplicated: Secondary | ICD-10-CM | POA: Insufficient documentation

## 2011-12-08 DIAGNOSIS — IMO0002 Reserved for concepts with insufficient information to code with codable children: Secondary | ICD-10-CM | POA: Insufficient documentation

## 2011-12-08 DIAGNOSIS — Z8249 Family history of ischemic heart disease and other diseases of the circulatory system: Secondary | ICD-10-CM | POA: Insufficient documentation

## 2011-12-08 DIAGNOSIS — Z818 Family history of other mental and behavioral disorders: Secondary | ICD-10-CM | POA: Insufficient documentation

## 2011-12-08 DIAGNOSIS — Z881 Allergy status to other antibiotic agents status: Secondary | ICD-10-CM | POA: Insufficient documentation

## 2011-12-08 DIAGNOSIS — F319 Bipolar disorder, unspecified: Secondary | ICD-10-CM | POA: Insufficient documentation

## 2011-12-08 DIAGNOSIS — M1710 Unilateral primary osteoarthritis, unspecified knee: Secondary | ICD-10-CM | POA: Insufficient documentation

## 2011-12-08 DIAGNOSIS — Z809 Family history of malignant neoplasm, unspecified: Secondary | ICD-10-CM | POA: Insufficient documentation

## 2011-12-08 DIAGNOSIS — M1990 Unspecified osteoarthritis, unspecified site: Secondary | ICD-10-CM

## 2011-12-08 DIAGNOSIS — Z8489 Family history of other specified conditions: Secondary | ICD-10-CM | POA: Insufficient documentation

## 2011-12-08 MED ORDER — OXYCODONE-ACETAMINOPHEN 5-325 MG PO TABS
1.0000 | ORAL_TABLET | Freq: Once | ORAL | Status: AC
  Administered 2011-12-08: 1 via ORAL

## 2011-12-08 MED ORDER — OXYCODONE-ACETAMINOPHEN 5-325 MG PO TABS
ORAL_TABLET | ORAL | Status: AC
  Administered 2011-12-08: 1 via ORAL
  Filled 2011-12-08: qty 1

## 2011-12-08 MED ORDER — OXYCODONE-ACETAMINOPHEN 5-325 MG PO TABS: 1.0000 | ORAL_TABLET | ORAL | Status: AC | PRN

## 2011-12-08 NOTE — ED Notes (Signed)
Patient with no complaints at this time. Respirations even and unlabored. Skin warm/dry. Discharge instructions reviewed with patient at this time. Patient given opportunity to voice concerns/ask questions. Patient discharged at this time and left Emergency Department with steady gait.   

## 2011-12-08 NOTE — ED Notes (Signed)
Pt states she has arthritis really bad. States she is having a flare up

## 2011-12-13 NOTE — ED Provider Notes (Signed)
History     CSN: [id number]  Arrival date & time 12/08/11  1155   First MD Initiated Contact with Patient 12/08/11 1220      Chief Complaint  Patient presents with  . Knee Pain    (Consider location/radiation/quality/duration/timing/severity/associated sxs/prior treatment) HPI Comments: SOLVEIG FANGMAN presents with acute on chronic right knee pain which she attributes to her arthritis.  She denies any new injury, weakness in the knee or leg,  No swelling, erythema and denies fevers.  She has a significant history of prior problems with her knee,  Pain is worse with weight  Bearing,  And feels stiff until she walks several steps.  She takes ibuprofen without relief.  She uses oxycodone for flare up of her pain but has run out.  She cannot get in with her pcp until next week.  The history is provided by the patient.    Past Medical History  Diagnosis Date  . Bipolar 1 disorder   . Back pain, chronic   . Leg pain   . HSV-2 (herpes simplex virus 2) infection 2013  . Bilateral chronic knee pain   . Fibromyalgia     Past Surgical History  Procedure Date  . Abdominal hysterectomy     Family History  Problem Relation Age of Onset  . Hypertension Mother   . Diabetes Mother   . Depression Mother   . Hyperlipidemia Mother   . Cancer Father   . Depression Father   . Hyperlipidemia Father   . Hypertension Father   . Depression Sister   . Hyperlipidemia Sister     History  Substance Use Topics  . Smoking status: Current Everyday Smoker -- 0.3 packs/day    Types: Cigarettes  . Smokeless tobacco: Not on file  . Alcohol Use: No    OB History    Grav Para Term Preterm Abortions TAB SAB Ect Mult Living                  Review of Systems  Musculoskeletal: Positive for arthralgias. Negative for joint swelling and gait problem.  Skin: Negative for wound.  Neurological: Negative for weakness and numbness.    Allergies  Ampicillin  Home Medications   Current  Outpatient Rx  Name Route Sig Dispense Refill  . ACETAMINOPHEN 325 MG PO TABS Oral Take 650 mg by mouth 2 (two) times daily as needed.    . ALPRAZOLAM 1 MG PO TABS Oral Take 1 mg by mouth 4 (four) times daily as needed. For anxiety    . CETIRIZINE HCL 10 MG PO TABS Oral Take 10 mg by mouth daily as needed. For allergies    . CYCLOBENZAPRINE HCL 10 MG PO TABS Oral Take 10 mg by mouth 3 (three) times daily as needed.    Marland Kitchen GABAPENTIN 400 MG PO CAPS Oral Take 400 mg by mouth 3 (three) times daily. Takes along with 600 mg to equal 1000 mg    . GABAPENTIN 600 MG PO TABS Oral Take 600 mg by mouth 3 (three) times daily. Takes with 400 mg to equal 1000 mg    . IBUPROFEN 200 MG PO TABS Oral Take 200 mg by mouth 3 (three) times daily as needed.    Marland Kitchen LAMOTRIGINE 100 MG PO TABS Oral Take 100 mg by mouth 2 (two) times daily.    Marland Kitchen OMEPRAZOLE 20 MG PO CPDR Oral Take 1 capsule (20 mg total) by mouth daily. 30 capsule 3  . OXYCODONE-ACETAMINOPHEN 5-325 MG PO  TABS Oral Take 1 tablet by mouth every 4 (four) hours as needed for pain. 15 tablet 0  . TRAZODONE HCL 150 MG PO TABS Oral Take 300 mg by mouth at bedtime.      BP 155/72  Pulse 89  Temp 98.4 F (36.9 C) (Oral)  Resp 18  Ht 5\' 5"  (1.651 m)  Wt 264 lb (119.75 kg)  BMI 43.93 kg/m2  SpO2 98%  Physical Exam  Constitutional: She appears well-developed and well-nourished.  HENT:  Head: Atraumatic.  Neck: Normal range of motion.  Cardiovascular:  Pulses:      Dorsalis pedis pulses are 2+ on the right side, and 2+ on the left side.       Pulses equal bilaterally  Musculoskeletal: She exhibits tenderness. She exhibits no edema.       Right knee: She exhibits normal range of motion, no swelling, no effusion, no deformity, normal alignment, no LCL laxity, normal patellar mobility, no bony tenderness and no MCL laxity. tenderness found. Medial joint line and lateral joint line tenderness noted.  Neurological: She is alert. She has normal strength. She  displays normal reflexes. No sensory deficit.       Equal strength noted in major flexor and extensor groups of both thigh and lower legs.    Skin: Skin is warm and dry.  Psychiatric: She has a normal mood and affect.    ED Course  Procedures (including critical care time)  Labs Reviewed - No data to display No results found.   1. Degenerative joint disease       MDM  Chronic knee pain with no red flag findings suggestive of infected joint.  Oxycodone,  Heat, pt encouraged to see pcp for further management.        Burgess Amor, PA 12/13/11 1239

## 2011-12-13 NOTE — ED Provider Notes (Signed)
Medical screening examination/treatment/procedure(s) were performed by non-physician practitioner and as supervising physician I was immediately available for consultation/collaboration.   Carleene Cooper III, MD 12/13/11 [phone]

## 2012-02-10 ENCOUNTER — Ambulatory Visit (INDEPENDENT_AMBULATORY_CARE_PROVIDER_SITE_OTHER): Payer: PRIVATE HEALTH INSURANCE | Admitting: Family Medicine

## 2012-02-10 ENCOUNTER — Encounter: Payer: Self-pay | Admitting: Family Medicine

## 2012-02-10 VITALS — BP 140/100 | HR 98 | Resp 15 | Ht 65.5 in | Wt 266.0 lb

## 2012-02-10 DIAGNOSIS — I10 Essential (primary) hypertension: Secondary | ICD-10-CM | POA: Insufficient documentation

## 2012-02-10 DIAGNOSIS — E669 Obesity, unspecified: Secondary | ICD-10-CM

## 2012-02-10 DIAGNOSIS — IMO0001 Reserved for inherently not codable concepts without codable children: Secondary | ICD-10-CM

## 2012-02-10 DIAGNOSIS — B349 Viral infection, unspecified: Secondary | ICD-10-CM | POA: Insufficient documentation

## 2012-02-10 DIAGNOSIS — R030 Elevated blood-pressure reading, without diagnosis of hypertension: Secondary | ICD-10-CM

## 2012-02-10 DIAGNOSIS — R51 Headache: Secondary | ICD-10-CM

## 2012-02-10 DIAGNOSIS — M549 Dorsalgia, unspecified: Secondary | ICD-10-CM

## 2012-02-10 DIAGNOSIS — B9789 Other viral agents as the cause of diseases classified elsewhere: Secondary | ICD-10-CM

## 2012-02-10 LAB — POCT URINALYSIS DIPSTICK
Bilirubin, UA: NEGATIVE
Ketones, UA: NEGATIVE
Leukocytes, UA: NEGATIVE
Protein, UA: NEGATIVE
Spec Grav, UA: 1.015

## 2012-02-10 MED ORDER — CYCLOBENZAPRINE HCL 10 MG PO TABS: 10.0000 mg | ORAL_TABLET | Freq: Three times a day (TID) | ORAL | Status: DC | PRN

## 2012-02-10 MED ORDER — PROMETHAZINE HCL 12.5 MG PO TABS: 12.5000 mg | ORAL_TABLET | Freq: Four times a day (QID) | ORAL | Status: DC | PRN

## 2012-02-10 MED ORDER — PROMETHAZINE HCL 25 MG/ML IJ SOLN
25.0000 mg | Freq: Once | INTRAMUSCULAR | Status: AC
  Administered 2012-02-10: 25 mg via INTRAMUSCULAR

## 2012-02-10 MED ORDER — IBUPROFEN 800 MG PO TABS: 200.0000 mg | ORAL_TABLET | Freq: Three times a day (TID) | ORAL | Status: DC | PRN

## 2012-02-10 MED ORDER — KETOROLAC TROMETHAMINE 60 MG/2ML IJ SOLN
60.0000 mg | Freq: Once | INTRAMUSCULAR | Status: AC
  Administered 2012-02-10: 60 mg via INTRAMUSCULAR

## 2012-02-10 NOTE — Addendum Note (Signed)
Addended by: Kandis Fantasia B on: 02/10/2012 03:37 PM   Modules accepted: Orders

## 2012-02-10 NOTE — Patient Instructions (Signed)
Take the nausea medicine as prescribed Use ibuprofen for pain/muscle aches Drink fluids Get the labs before next appt F/U 8 weeks for blood pressure

## 2012-02-10 NOTE — Assessment & Plan Note (Signed)
Unchanged Flexeril and ibuprofen refilled

## 2012-02-10 NOTE — Progress Notes (Signed)
  Subjective:    Patient ID: Annette Galvan, female    DOB: 1960/06/28, 51 y.o.   MRN: [id number]  HPI Patient here with generalized body aches, headache and nausea for the past 3 days. She positive sick contact with her granddaughter and her daughter both have viral illness. She denies any emesis. She states she's not been drinking a lot of water. She denies diarrhea, rash. She is asking for refill on her Flexeril and ibuprofen. She had some pressure near her kidneys and over her bladder was worried about infection. Denies dysuria   Review of Systems  GEN- denies fatigue, fever, weight loss,weakness, recent illness HEENT- denies eye drainage, change in vision, nasal discharge, CVS- denies chest pain, palpitations RESP- denies SOB, cough, wheeze ABD- denies N/V, change in stools, abd pain GU- denies dysuria, hematuria, dribbling, incontinence MSK- denies joint pain,+ muscle aches, injury Neuro- denies headache, dizziness, syncope, seizure activity      Objective:   Physical Exam  GEN- NAD, alert and oriented x3 HEENT- PERRL, EOMI, non injected sclera, pink conjunctiva, MMM, oropharynx clear, Tm show scarring, no maxillary sinus tenderness, nares clear,  Neck- Supple, no LAD CVS- RRR, no murmur RESP-CTAB ABS-NABS,soft,NT,ND, no CVA tenderness EXT- No edema Pulses- Radial, DP- 2+        Assessment & Plan:

## 2012-02-10 NOTE — Assessment & Plan Note (Signed)
Supportive care, non toxic appearing. Given phenergan in office and toradol for headache. She did very well with this and look better afterwards. We'll send her home with prescription for Phenergan rest and plenty of fluids.

## 2012-02-10 NOTE — Addendum Note (Signed)
Addended by: Abner Greenspan on: 02/10/2012 03:38 PM   Modules accepted: Orders

## 2012-02-10 NOTE — Assessment & Plan Note (Signed)
Elevated blood pressure noted today. She also had one other elevated blood pressure during an emergency room visit. I have her obtain baseline labs including fasting lipid panel recheck this at her next visit. No medications were started today in setting of acute illness

## 2012-02-20 ENCOUNTER — Other Ambulatory Visit: Payer: Self-pay | Admitting: Family Medicine

## 2012-02-23 ENCOUNTER — Ambulatory Visit: Payer: PRIVATE HEALTH INSURANCE | Admitting: Family Medicine

## 2012-02-27 ENCOUNTER — Emergency Department (HOSPITAL_COMMUNITY)
Admission: EM | Admit: 2012-02-27 | Discharge: 2012-02-27 | Disposition: A | Discharge status: Home / Self Care | Payer: PRIVATE HEALTH INSURANCE | Attending: Emergency Medicine | Admitting: Emergency Medicine

## 2012-02-27 ENCOUNTER — Encounter (HOSPITAL_COMMUNITY): Payer: Self-pay

## 2012-02-27 DIAGNOSIS — G8929 Other chronic pain: Secondary | ICD-10-CM | POA: Insufficient documentation

## 2012-02-27 DIAGNOSIS — Z79899 Other long term (current) drug therapy: Secondary | ICD-10-CM | POA: Insufficient documentation

## 2012-02-27 DIAGNOSIS — M009 Pyogenic arthritis, unspecified: Secondary | ICD-10-CM | POA: Insufficient documentation

## 2012-02-27 DIAGNOSIS — IMO0001 Reserved for inherently not codable concepts without codable children: Secondary | ICD-10-CM | POA: Insufficient documentation

## 2012-02-27 DIAGNOSIS — M1711 Unilateral primary osteoarthritis, right knee: Secondary | ICD-10-CM

## 2012-02-27 DIAGNOSIS — F319 Bipolar disorder, unspecified: Secondary | ICD-10-CM | POA: Insufficient documentation

## 2012-02-27 DIAGNOSIS — F17200 Nicotine dependence, unspecified, uncomplicated: Secondary | ICD-10-CM | POA: Insufficient documentation

## 2012-02-27 MED ORDER — HYDROCODONE-ACETAMINOPHEN 5-325 MG PO TABS: 1.0000 | ORAL_TABLET | Freq: Four times a day (QID) | ORAL | Status: AC | PRN

## 2012-02-27 MED ORDER — HYDROCODONE-ACETAMINOPHEN 5-325 MG PO TABS
1.0000 | ORAL_TABLET | Freq: Once | ORAL | Status: AC
  Administered 2012-02-27: 1 via ORAL
  Filled 2012-02-27: qty 1

## 2012-02-27 NOTE — ED Notes (Signed)
Pt c/o chronic rt knee pain that flares up intermittently. Pt denies any injury to the knee.

## 2012-02-27 NOTE — ED Provider Notes (Signed)
History     CSN: [id number]  Arrival date & time 02/27/12  1245   None     Chief Complaint  Patient presents with  . Knee Pain    (Consider location/radiation/quality/duration/timing/severity/associated sxs/prior treatment) HPI Comments: Pt has known severe arthritis in the R knee.  She sees dr. Jeanice Lim and dr. Valentina Gu for same.  Taking ibuprofen but irritating her stomach   Patient is a 51 y.o. female presenting with knee pain. The history is provided by the patient. No language interpreter was used.  Knee Pain This is a chronic problem. Episode onset: several days ago. The problem occurs constantly. The problem has been unchanged. Pertinent negatives include no chills, fever or joint swelling. The symptoms are aggravated by walking and standing. She has tried NSAIDs for the symptoms. The treatment provided no relief.    Past Medical History  Diagnosis Date  . Bipolar 1 disorder   . Back pain, chronic   . Leg pain   . HSV-2 (herpes simplex virus 2) infection 2013  . Bilateral chronic knee pain   . Fibromyalgia     Past Surgical History  Procedure Date  . Abdominal hysterectomy     Family History  Problem Relation Age of Onset  . Hypertension Mother   . Diabetes Mother   . Depression Mother   . Hyperlipidemia Mother   . Cancer Father   . Depression Father   . Hyperlipidemia Father   . Hypertension Father   . Depression Sister   . Hyperlipidemia Sister     History  Substance Use Topics  . Smoking status: Current Every Day Smoker -- 0.3 packs/day    Types: Cigarettes  . Smokeless tobacco: Not on file  . Alcohol Use: No    OB History    Grav Para Term Preterm Abortions TAB SAB Ect Mult Living                  Review of Systems  Constitutional: Negative for fever and chills.  Musculoskeletal: Negative for joint swelling.       Knee pain   Skin: Negative for wound.  All other systems reviewed and are negative.    Allergies  Ampicillin  Home  Medications   Current Outpatient Rx  Name  Route  Sig  Dispense  Refill  . ACETAMINOPHEN 325 MG PO TABS   Oral   Take 650 mg by mouth 2 (two) times daily as needed. Pain         . ALPRAZOLAM 1 MG PO TABS   Oral   Take 1 mg by mouth 4 (four) times daily as needed. For anxiety         . CETIRIZINE HCL 10 MG PO TABS   Oral   Take 10 mg by mouth daily as needed. For allergies         . FLUTICASONE PROPIONATE 50 MCG/ACT NA SUSP   Nasal   Place 2 sprays into the nose 2 (two) times daily.         Marland Kitchen GABAPENTIN 400 MG PO CAPS   Oral   Take 400-800 mg by mouth 2 (two) times daily. Takes 400mg  in the morning and 800mg  in the evening.         Marland Kitchen LAMOTRIGINE 100 MG PO TABS   Oral   Take 100 mg by mouth 2 (two) times daily.         Marland Kitchen NAPROXEN 500 MG PO TABS   Oral   Take 500  mg by mouth 2 (two) times daily as needed.         Marland Kitchen OMEPRAZOLE 20 MG PO CPDR   Oral   Take 20 mg by mouth daily.         . TRAZODONE HCL 150 MG PO TABS   Oral   Take 300 mg by mouth at bedtime.         Marland Kitchen HYDROCODONE-ACETAMINOPHEN 5-325 MG PO TABS   Oral   Take 1 tablet by mouth every 6 (six) hours as needed for pain.   12 tablet   0     BP 123/79  Pulse 96  Temp 98.3 F (36.8 C) (Oral)  Resp 18  Ht 5\' 5"  (1.651 m)  Wt 266 lb (120.657 kg)  BMI 44.26 kg/m2  SpO2 100%  Physical Exam  Nursing note and vitals reviewed. Constitutional: She is oriented to person, place, and time. She appears well-developed and well-nourished. No distress.  HENT:  Head: Normocephalic and atraumatic.  Eyes: EOM are normal.  Neck: Normal range of motion.  Cardiovascular: Normal rate and regular rhythm.   Pulmonary/Chest: Effort normal.  Abdominal: Soft. She exhibits no distension. There is no tenderness.  Musculoskeletal:       Right knee: She exhibits decreased range of motion. She exhibits no swelling, no effusion, no ecchymosis, no deformity and no laceration.       Diffuse R knee pain.  Worse  with movement and weight bearing.  Neurological: She is alert and oriented to person, place, and time.  Skin: Skin is warm and dry.  Psychiatric: She has a normal mood and affect. Judgment normal.    ED Course  Procedures (including critical care time)  Labs Reviewed - No data to display No results found.   1. Arthritis of right knee       MDM  Has knee immob and walker at home rx-hydrocodone, 12 F/u with PCP or dr.lucy        Evalina Field, PA 02/27/12 1402

## 2012-02-27 NOTE — ED Notes (Signed)
Pt has chronic knee pain. Has appt with [facility] doctor in two weeks.

## 2012-02-27 NOTE — ED Provider Notes (Signed)
Medical screening examination/treatment/procedure(s) were performed by non-physician practitioner and as supervising physician I was immediately available for consultation/collaboration.  Jamai Dolce, MD 02/27/12 1606 

## 2012-03-13 ENCOUNTER — Other Ambulatory Visit: Payer: Self-pay | Admitting: Family Medicine

## 2012-03-24 ENCOUNTER — Other Ambulatory Visit: Payer: Self-pay | Admitting: Family Medicine

## 2012-05-01 ENCOUNTER — Other Ambulatory Visit: Payer: Self-pay | Admitting: Family Medicine

## 2012-05-18 ENCOUNTER — Other Ambulatory Visit: Payer: Self-pay | Admitting: Family Medicine

## 2012-06-05 ENCOUNTER — Other Ambulatory Visit: Payer: Self-pay | Admitting: Family Medicine

## 2012-06-16 ENCOUNTER — Other Ambulatory Visit: Payer: Self-pay | Admitting: Family Medicine

## 2012-07-05 ENCOUNTER — Encounter (HOSPITAL_COMMUNITY): Payer: Self-pay | Admitting: *Deleted

## 2012-07-05 ENCOUNTER — Emergency Department (HOSPITAL_COMMUNITY)
Admission: EM | Admit: 2012-07-05 | Discharge: 2012-07-05 | Discharge status: Against Medical Advice | Payer: PRIVATE HEALTH INSURANCE | Attending: Emergency Medicine | Admitting: Emergency Medicine

## 2012-07-05 DIAGNOSIS — F319 Bipolar disorder, unspecified: Secondary | ICD-10-CM | POA: Insufficient documentation

## 2012-07-05 DIAGNOSIS — Z90710 Acquired absence of both cervix and uterus: Secondary | ICD-10-CM | POA: Insufficient documentation

## 2012-07-05 DIAGNOSIS — Z8739 Personal history of other diseases of the musculoskeletal system and connective tissue: Secondary | ICD-10-CM | POA: Insufficient documentation

## 2012-07-05 DIAGNOSIS — Z79899 Other long term (current) drug therapy: Secondary | ICD-10-CM | POA: Insufficient documentation

## 2012-07-05 DIAGNOSIS — K5900 Constipation, unspecified: Secondary | ICD-10-CM | POA: Insufficient documentation

## 2012-07-05 DIAGNOSIS — F17200 Nicotine dependence, unspecified, uncomplicated: Secondary | ICD-10-CM | POA: Insufficient documentation

## 2012-07-05 DIAGNOSIS — Z8619 Personal history of other infectious and parasitic diseases: Secondary | ICD-10-CM | POA: Insufficient documentation

## 2012-07-05 DIAGNOSIS — R079 Chest pain, unspecified: Secondary | ICD-10-CM

## 2012-07-05 DIAGNOSIS — M2550 Pain in unspecified joint: Secondary | ICD-10-CM | POA: Insufficient documentation

## 2012-07-05 DIAGNOSIS — R1013 Epigastric pain: Secondary | ICD-10-CM

## 2012-07-05 NOTE — ED Notes (Signed)
Chest pain for  2 days, with  abd discomfort,  Feels she is constipated,  Decreased urination    Pain rt knee,

## 2012-07-05 NOTE — ED Notes (Signed)
Pt also complains of arthritis in right knee and complains of pain 9/10. Requests the dr to assess her knee.

## 2012-07-05 NOTE — ED Provider Notes (Signed)
History     This chart was scribed for Shelda Jakes, MD, MD by Smitty Pluck, ED Scribe. The patient was seen in room APA04/APA04 and the patient's care was started at 12:52 PM.   CSN: [id number]  Arrival date & time 07/05/12  1142     Chief Complaint  Patient presents with  . Chest Pain     Patient is a 52 y.o. female presenting with abdominal pain. The history is provided by the patient and medical records. No language interpreter was used.  Abdominal Pain Pain location:  Epigastric Pain radiates to:  Back Pain severity:  Moderate Onset quality:  Sudden Duration:  2 days Timing:  Constant Progression:  Unchanged Chronicity:  New Relieved by:  Nothing Associated symptoms: constipation   Associated symptoms: no chills and no diarrhea    Annette Galvan is a 52 y.o. female with hx of knee pain, bipolar disorder, fibromyalgia who presents to the Emergency Department complaining of constant, moderate, epigastric pain radiating to back onset 2 days ago. She describes the pain as "pushing." She reports having nausea and constipation. Pt mentions having right knee pain due to arthritis. She has taken motrin and naproxen without relief. Pt denies fever, chills, vomiting, diarrhea, weakness, cough, SOB and any other pain.   Past Medical History  Diagnosis Date  . Bipolar 1 disorder   . Back pain, chronic   . Leg pain   . HSV-2 (herpes simplex virus 2) infection 2013  . Bilateral chronic knee pain   . Fibromyalgia     Past Surgical History  Procedure Laterality Date  . Abdominal hysterectomy      Family History  Problem Relation Age of Onset  . Hypertension Mother   . Diabetes Mother   . Depression Mother   . Hyperlipidemia Mother   . Cancer Father   . Depression Father   . Hyperlipidemia Father   . Hypertension Father   . Depression Sister   . Hyperlipidemia Sister     History  Substance Use Topics  . Smoking status: Current Every Day Smoker -- 0.30 packs/day     Types: Cigarettes  . Smokeless tobacco: Not on file  . Alcohol Use: No    OB History   Grav Para Term Preterm Abortions TAB SAB Ect Mult Living                  Review of Systems  Constitutional: Negative for chills.  HENT: Negative for congestion, rhinorrhea and neck pain.   Eyes: Negative for visual disturbance.  Gastrointestinal: Positive for abdominal pain and constipation. Negative for diarrhea.  Musculoskeletal: Positive for arthralgias.  Skin: Negative for rash.  Hematological: Does not bruise/bleed easily.  Psychiatric/Behavioral: Negative for confusion.  All other systems reviewed and are negative.    Allergies  Ampicillin  Home Medications   Current Outpatient Rx  Name  Route  Sig  Dispense  Refill  . ALPRAZolam (XANAX) 1 MG tablet   Oral   Take 1 mg by mouth 4 (four) times daily as needed. For anxiety         . cetirizine (ZYRTEC) 10 MG tablet   Oral   Take 10 mg by mouth daily.         . fluticasone (FLONASE) 50 MCG/ACT nasal spray   Nasal   Place 2 sprays into the nose daily.         Marland Kitchen gabapentin (NEURONTIN) 400 MG capsule   Oral   Take 400-800 mg  by mouth 2 (two) times daily. Takes 400mg  in the morning and 800mg  in the evening.         Marland Kitchen ibuprofen (ADVIL,MOTRIN) 800 MG tablet   Oral   Take 800 mg by mouth 2 (two) times daily as needed for pain.         Marland Kitchen lamoTRIgine (LAMICTAL) 100 MG tablet   Oral   Take 100 mg by mouth 2 (two) times daily.         . naproxen (NAPROSYN) 500 MG tablet   Oral   Take 500 mg by mouth 2 (two) times daily as needed (for pain).         Marland Kitchen omeprazole (PRILOSEC) 20 MG capsule   Oral   Take 20 mg by mouth every morning.         Marland Kitchen omeprazole (PRILOSEC) 20 MG capsule               . traZODone (DESYREL) 150 MG tablet   Oral   Take 300 mg by mouth at bedtime.           BP 130/74  Pulse 77  Temp(Src) 97.6 F (36.4 C) (Oral)  Resp 20  Ht 5\' 4"  (1.626 m)  Wt 270 lb (122.471 kg)  BMI  46.32 kg/m2  SpO2 99%  Physical Exam  Nursing note and vitals reviewed. Constitutional: She is oriented to person, place, and time. She appears well-developed and well-nourished. No distress.  HENT:  Head: Normocephalic and atraumatic.  Eyes: Conjunctivae and EOM are normal. Pupils are equal, round, and reactive to light.  Neck: Normal range of motion. Neck supple. No tracheal deviation present.  Cardiovascular: Normal rate, regular rhythm and normal heart sounds.   Pulmonary/Chest: Effort normal and breath sounds normal. No respiratory distress. She has no wheezes. She has no rales.  Abdominal: Soft. Bowel sounds are normal. She exhibits no distension. There is no tenderness. There is no rebound and no guarding.  Musculoskeletal: Normal range of motion. She exhibits no edema.  Neurological: She is alert and oriented to person, place, and time. No cranial nerve deficit.  Skin: Skin is warm and dry.  Psychiatric: She has a normal mood and affect. Her behavior is normal.    ED Course  Procedures (including critical care time) DIAGNOSTIC STUDIES: Oxygen Saturation is 99% on room air, normal by my interpretation.    COORDINATION OF CARE: 12:56 PM Discussed ED treatment with pt and pt agrees.     Date: 07/05/2012  Rate: 72  Rhythm: normal sinus rhythm  QRS Axis: normal  Intervals: normal  ST/T Wave abnormalities: normal  Conduction Disutrbances:none  Narrative Interpretation:   Old EKG Reviewed: unchanged No significant change in EKG compared to 07/07/2009.  Labs Reviewed - No data to display No results found.   1. Chest pain   2. Epigastric abdominal pain       MDM   Patient left AMA before labs were ordered were completed. Patient was under receive workup for epigastric abdominal pain and possible chest pain the chest x-ray CT of the abdomen and labs and pain medication.  Patient's EKG has no acute changes on it. No evidence of a STEMI.      I personally  performed the services described in this documentation, which was scribed in my presence. The recorded information has been reviewed and is accurate.     Shelda Jakes, MD 07/05/12 [phone]

## 2012-07-05 NOTE — ED Notes (Signed)
Pt states she does not want to stay any longer or for any further tests. Encouraged to stay patient declined.

## 2012-08-01 ENCOUNTER — Other Ambulatory Visit: Payer: Self-pay

## 2012-08-13 ENCOUNTER — Ambulatory Visit: Payer: PRIVATE HEALTH INSURANCE | Admitting: Family Medicine

## 2012-08-15 ENCOUNTER — Telehealth: Payer: Self-pay | Admitting: Adult Health

## 2012-08-15 NOTE — Telephone Encounter (Signed)
Feel like she is losing it at times, she is bipolar, told her to make appt.

## 2012-09-06 ENCOUNTER — Other Ambulatory Visit: Payer: Self-pay | Admitting: Family Medicine

## 2012-09-13 ENCOUNTER — Ambulatory Visit (INDEPENDENT_AMBULATORY_CARE_PROVIDER_SITE_OTHER): Payer: PRIVATE HEALTH INSURANCE | Admitting: Family Medicine

## 2012-09-13 ENCOUNTER — Encounter: Payer: Self-pay | Admitting: Family Medicine

## 2012-09-13 ENCOUNTER — Telehealth: Payer: Self-pay | Admitting: Family Medicine

## 2012-09-13 VITALS — BP 124/88 | HR 76 | Resp 16 | Wt 267.0 lb

## 2012-09-13 DIAGNOSIS — H00019 Hordeolum externum unspecified eye, unspecified eyelid: Secondary | ICD-10-CM | POA: Insufficient documentation

## 2012-09-13 DIAGNOSIS — R7309 Other abnormal glucose: Secondary | ICD-10-CM

## 2012-09-13 DIAGNOSIS — G4730 Sleep apnea, unspecified: Secondary | ICD-10-CM

## 2012-09-13 DIAGNOSIS — R7303 Prediabetes: Secondary | ICD-10-CM

## 2012-09-13 DIAGNOSIS — IMO0001 Reserved for inherently not codable concepts without codable children: Secondary | ICD-10-CM

## 2012-09-13 DIAGNOSIS — F17200 Nicotine dependence, unspecified, uncomplicated: Secondary | ICD-10-CM

## 2012-09-13 DIAGNOSIS — E669 Obesity, unspecified: Secondary | ICD-10-CM

## 2012-09-13 DIAGNOSIS — F319 Bipolar disorder, unspecified: Secondary | ICD-10-CM

## 2012-09-13 DIAGNOSIS — Z720 Tobacco use: Secondary | ICD-10-CM

## 2012-09-13 DIAGNOSIS — M797 Fibromyalgia: Secondary | ICD-10-CM

## 2012-09-13 DIAGNOSIS — H00016 Hordeolum externum left eye, unspecified eyelid: Secondary | ICD-10-CM

## 2012-09-13 LAB — COMPREHENSIVE METABOLIC PANEL
Albumin: 4.2 g/dL (ref 3.5–5.2)
BUN: 16 mg/dL (ref 6–23)
CO2: 27 mEq/L (ref 19–32)
Calcium: 9.3 mg/dL (ref 8.4–10.5)
Chloride: 101 mEq/L (ref 96–112)
Creat: 0.76 mg/dL (ref 0.50–1.10)
Glucose, Bld: 84 mg/dL (ref 70–99)
Potassium: 4.3 mEq/L (ref 3.5–5.3)

## 2012-09-13 LAB — CBC
HCT: 41.8 % (ref 36.0–46.0)
Hemoglobin: 13.7 g/dL (ref 12.0–15.0)
MCHC: 32.8 g/dL (ref 30.0–36.0)
RDW: 14.3 % (ref 11.5–15.5)
WBC: 5.8 10*3/uL (ref 4.0–10.5)

## 2012-09-13 LAB — LIPID PANEL
Cholesterol: 168 mg/dL (ref 0–200)
Triglycerides: 109 mg/dL (ref <150)

## 2012-09-13 MED ORDER — IBUPROFEN 800 MG PO TABS: 800.0000 mg | ORAL_TABLET | Freq: Two times a day (BID) | ORAL | Status: DC | PRN

## 2012-09-13 MED ORDER — NICOTINE 21 MG/24HR TD PT24: 1.0000 | MEDICATED_PATCH | TRANSDERMAL | Status: DC

## 2012-09-13 MED ORDER — PREGABALIN 75 MG PO CAPS: 75.0000 mg | ORAL_CAPSULE | Freq: Two times a day (BID) | ORAL | Status: DC

## 2012-09-13 NOTE — Assessment & Plan Note (Signed)
Continue to work on weight loss she needs to improve her diet

## 2012-09-13 NOTE — Telephone Encounter (Signed)
Patient aware that meds are at pharmacy.

## 2012-09-13 NOTE — Assessment & Plan Note (Signed)
She states she has been told she has sleep apnea she had some type of sleep evaluation however I cannot on the results of this that was done in [place]. She will like to be referred to neurology so that she can have a CPAP fitted. As I have no where to fine her results from the sleep study or she actually had a complete sleep study I will refer her to neurology to restart work up

## 2012-09-13 NOTE — Assessment & Plan Note (Signed)
Treat conservatively with warm compresses

## 2012-09-13 NOTE — Assessment & Plan Note (Signed)
Will obtain A1c.  

## 2012-09-13 NOTE — Patient Instructions (Addendum)
Decrease the the gabapentin to 1 tablet three times a day for  1 week, then decrease to 1 tablet twice a day  For 1 week, then 1 tablet at bedtime for 1 week then stop Start lyrica 75 mg twice a day  Warm compress to your eye for the stye Referral to Dr. Gerilyn Pilgrim for the sleep study  Referral for your colonoscopy I will get record from Dr. Murray Hodgkins  Try the patches for smoking cessation Get the blood work done F/U 2 months Fibromyalgia Fibromyalgia is a disorder that is often misunderstood. It is associated with muscular pains and tenderness that comes and goes. It is often associated with fatigue and sleep disturbances. Though it tends to be long-lasting, fibromyalgia is not life-threatening. CAUSES  The exact cause of fibromyalgia is unknown. People with certain gene types are predisposed to developing fibromyalgia and other conditions. Certain factors can play a role as triggers, such as:  Spine disorders.  Arthritis.  Severe injury (trauma) and other physical stressors.  Emotional stressors. SYMPTOMS   The main symptom is pain and stiffness in the muscles and joints, which can vary over time.  Sleep and fatigue problems. Other related symptoms may include:  Bowel and bladder problems.  Headaches.  Visual problems.  Problems with odors and noises.  Depression or mood changes.  Painful periods (dysmenorrhea).  Dryness of the skin or eyes. DIAGNOSIS  There are no specific tests for diagnosing fibromyalgia. Patients can be diagnosed accurately from the specific symptoms they have. The diagnosis is made by determining that nothing else is causing the problems. TREATMENT  There is no cure. Management includes medicines and an active, healthy lifestyle. The goal is to enhance physical fitness, decrease pain, and improve sleep. HOME CARE INSTRUCTIONS   Only take over-the-counter or prescription medicines as directed by your caregiver. Sleeping pills, tranquilizers, and pain  medicines may make your problems worse.  Low-impact aerobic exercise is very important and advised for treatment. At first, it may seem to make pain worse. Gradually increasing your tolerance will overcome this feeling.  Learning relaxation techniques and how to control stress will help you. Biofeedback, visual imagery, hypnosis, muscle relaxation, yoga, and meditation are all options.  Anti-inflammatory medicines and physical therapy may provide short-term help.  Acupuncture or massage treatments may help.  Take muscle relaxant medicines as suggested by your caregiver.  Avoid stressful situations.  Plan a healthy lifestyle. This includes your diet, sleep, rest, exercise, and friends.  Find and practice a hobby you enjoy.  Join a fibromyalgia support group for interaction, ideas, and sharing advice. This may be helpful. SEEK MEDICAL CARE IF:  You are not having good results or improvement from your treatment. FOR MORE INFORMATION  National Fibromyalgia Association: www.fmaware.org Arthritis Foundation: www.arthritis.org Document Released: 04/04/2005 Document Revised: 06/27/2011 Document Reviewed: 07/15/2009 [facility] Patient Information 2014 [place], [place].

## 2012-09-13 NOTE — Assessment & Plan Note (Signed)
Currently followed by mental health at  [facility]

## 2012-09-13 NOTE — Assessment & Plan Note (Signed)
Will plan to transition her to Lyrica we will decrease the gabapentin and start Lyrica at 75 mg twice a day

## 2012-09-13 NOTE — Progress Notes (Signed)
  Subjective:    Patient ID: Annette Galvan, female    DOB: Sep 03, 1960, 52 y.o.   MRN: [id number]  HPI Patient here to follow chronic medical problems, with multiple concerns She continues to have pain all over with her fibromyalgia feels like her body is on fire at times anywhere she is touch even by her grandchildren or her pets she has pain. She was advised by her orthopedic surgeon to come talk to me about Lyrica as her gabapentin has not been helping. She needs to have a right knee replacement however is told she needs to lose 30 pounds before she can have this done. She noticed a swelling over her left eye yesterday after coming out of the 10 day. She also felt like there was fluid in her right ear  She would like some help to quit smoking. She's also asking for referral to neurology she was told she has sleep apnea by her pain management specialist states she had half of a sleep study but she does not know where this was but was told she would need a CPAP   Review of Systems   GEN- denies fatigue, fever, weight loss,weakness, recent illness HEENT- denies eye drainage, change in vision, nasal discharge, CVS- denies chest pain, palpitations RESP- denies SOB, cough, wheeze ABD- denies N/V, change in stools, abd pain GU- denies dysuria, hematuria, dribbling, incontinence MSK- denies joint pain, +muscle aches, injury Neuro- denies headache, dizziness, syncope, seizure activity      Objective:   Physical Exam GEN- NAD, alert and oriented x3,obese HEENT- PERRL, EOMI, non injected sclera, pink conjunctiva, MMM, oropharynx clear, Stye Left upper eyelid- near nose, TM clear bilat, scarring Right TM no fluid or drainage noted, canal clear bilat Neck- Supple,  CVS- RRR, no murmur RESP-CTAB MSK- TTP multiple trigger spots on back, legs, arms EXT- No edema Pulses- Radial, DP- 2+        Assessment & Plan:

## 2012-09-13 NOTE — Assessment & Plan Note (Signed)
Counseled on smoking cessation she was given prescription for NicoDerm patches. She may benefit from the Chantix her Wellbutrin however I do not want to prescribe this until she sees her psychiatrist and he states is okay with her current medications in her mental status

## 2012-09-17 ENCOUNTER — Other Ambulatory Visit: Payer: Self-pay | Admitting: Family Medicine

## 2012-09-17 DIAGNOSIS — G4730 Sleep apnea, unspecified: Secondary | ICD-10-CM

## 2012-09-25 ENCOUNTER — Telehealth: Payer: Self-pay | Admitting: Family Medicine

## 2012-09-25 DIAGNOSIS — Z1211 Encounter for screening for malignant neoplasm of colon: Secondary | ICD-10-CM

## 2012-09-25 NOTE — Telephone Encounter (Signed)
Referral placed.

## 2012-09-25 NOTE — Telephone Encounter (Signed)
Pt says you were going to set her up for colonoscopy 2 weeks ago, she hasnt heard anything yet about an apppt.. I looked up the referral and do not see where one was ordered. She says she can do it anytime in the morning after 9am and before 3pm.

## 2012-09-28 ENCOUNTER — Telehealth: Payer: Self-pay | Admitting: Family Medicine

## 2012-10-03 NOTE — Telephone Encounter (Signed)
PATIENT IS AWARE 

## 2012-10-11 ENCOUNTER — Encounter: Payer: Self-pay | Admitting: *Deleted

## 2012-10-12 ENCOUNTER — Ambulatory Visit (INDEPENDENT_AMBULATORY_CARE_PROVIDER_SITE_OTHER): Payer: PRIVATE HEALTH INSURANCE | Admitting: Adult Health

## 2012-10-12 ENCOUNTER — Encounter: Payer: Self-pay | Admitting: Adult Health

## 2012-10-12 VITALS — BP 118/96 | Ht 65.0 in | Wt 276.0 lb

## 2012-10-12 DIAGNOSIS — N898 Other specified noninflammatory disorders of vagina: Secondary | ICD-10-CM

## 2012-10-12 DIAGNOSIS — M25569 Pain in unspecified knee: Secondary | ICD-10-CM

## 2012-10-12 DIAGNOSIS — M25562 Pain in left knee: Secondary | ICD-10-CM

## 2012-10-12 DIAGNOSIS — N390 Urinary tract infection, site not specified: Secondary | ICD-10-CM

## 2012-10-12 HISTORY — DX: Urinary tract infection, site not specified: N39.0

## 2012-10-12 LAB — POCT URINALYSIS DIPSTICK
Glucose, UA: NEGATIVE
Ketones, UA: NEGATIVE
Protein, UA: NEGATIVE

## 2012-10-12 LAB — POCT WET PREP (WET MOUNT)
Trichomonas Wet Prep HPF POC: NEGATIVE
WBC, Wet Prep HPF POC: NEGATIVE

## 2012-10-12 MED ORDER — SULFAMETHOXAZOLE-TRIMETHOPRIM 800-160 MG PO TABS: 1.0000 | ORAL_TABLET | Freq: Two times a day (BID) | ORAL | Status: DC

## 2012-10-12 NOTE — Patient Instructions (Addendum)
Take septra ds 1 bid x 7 days Push fluids RICE left knee Call prn

## 2012-10-12 NOTE — Addendum Note (Signed)
Addended by: Criss Alvine on: 10/12/2012 12:43 PM   Modules accepted: Orders

## 2012-10-12 NOTE — Progress Notes (Signed)
Subjective:     Patient ID: Annette Galvan, female   DOB: Jun 16, 1960, 52 y.o.   MRN: [id number]  HPI Annette Galvan is a52 year old white female in complaining of a spot on her mons pubis and ? Yeast infection.She also said she twisted her left knee about 4 days ago while cleaning kitchen floor, and has some pain, she has a history of bilateral knee pain.  Review of Systems Positives in HPI   Reviewed past medical,surgical, social and family history. Reviewed medications and allergies.  Objective:   Physical Exam BP 118/96  Ht 5\' 5"  (1.651 m)  Wt 276 lb (125.193 kg)  BMI 45.93 kg/m2 Urine + nitrates, Skin warm and dry.Pelvic: external genitalia is normal in appearance,except has a resolving folliculitis on mons pubis, vagina: white discharge without odor, cervix and uterus are absent, adnexa: no masses or tenderness noted. Wet prep: was negative   left knee has no bruising,negatvie drawer sign, mild swelling, no point tenderness Assessment:      UTI Vaginal discharge   Left knee discomfort  Resolving folliculitis Plan:    No not shave   Rx Septra DS 1 bid x 7 days Push water RICE left knee, if continues to bother her see PCP   Try motrin if needed Urine sent for culture Follow up prn

## 2012-10-15 ENCOUNTER — Telehealth: Payer: Self-pay

## 2012-10-15 LAB — URINE CULTURE: Colony Count: >=100000

## 2012-10-15 NOTE — Telephone Encounter (Signed)
Pt was referred by Dr. Jeanice Lim for screening colonoscopy. LMOM to call.

## 2012-10-16 NOTE — Telephone Encounter (Signed)
Pt is scheduled OV with [facility] on 11/01/2012 at 3:00 pm due to meds.

## 2012-10-17 ENCOUNTER — Emergency Department (HOSPITAL_COMMUNITY)
Admission: EM | Admit: 2012-10-17 | Discharge: 2012-10-17 | Disposition: A | Discharge status: Home / Self Care | Payer: PRIVATE HEALTH INSURANCE | Attending: Emergency Medicine | Admitting: Emergency Medicine

## 2012-10-17 ENCOUNTER — Telehealth: Payer: Self-pay | Admitting: Adult Health

## 2012-10-17 ENCOUNTER — Emergency Department (HOSPITAL_COMMUNITY): Payer: PRIVATE HEALTH INSURANCE

## 2012-10-17 ENCOUNTER — Encounter (HOSPITAL_COMMUNITY): Payer: Self-pay | Admitting: *Deleted

## 2012-10-17 DIAGNOSIS — F319 Bipolar disorder, unspecified: Secondary | ICD-10-CM | POA: Insufficient documentation

## 2012-10-17 DIAGNOSIS — E669 Obesity, unspecified: Secondary | ICD-10-CM | POA: Insufficient documentation

## 2012-10-17 DIAGNOSIS — F17200 Nicotine dependence, unspecified, uncomplicated: Secondary | ICD-10-CM | POA: Insufficient documentation

## 2012-10-17 DIAGNOSIS — Z880 Allergy status to penicillin: Secondary | ICD-10-CM | POA: Insufficient documentation

## 2012-10-17 DIAGNOSIS — Z8739 Personal history of other diseases of the musculoskeletal system and connective tissue: Secondary | ICD-10-CM | POA: Insufficient documentation

## 2012-10-17 DIAGNOSIS — Z87440 Personal history of urinary (tract) infections: Secondary | ICD-10-CM | POA: Insufficient documentation

## 2012-10-17 DIAGNOSIS — Z91040 Latex allergy status: Secondary | ICD-10-CM | POA: Insufficient documentation

## 2012-10-17 DIAGNOSIS — Z8619 Personal history of other infectious and parasitic diseases: Secondary | ICD-10-CM | POA: Insufficient documentation

## 2012-10-17 DIAGNOSIS — X500XXA Overexertion from strenuous movement or load, initial encounter: Secondary | ICD-10-CM | POA: Insufficient documentation

## 2012-10-17 DIAGNOSIS — Y929 Unspecified place or not applicable: Secondary | ICD-10-CM | POA: Insufficient documentation

## 2012-10-17 DIAGNOSIS — G8929 Other chronic pain: Secondary | ICD-10-CM | POA: Insufficient documentation

## 2012-10-17 DIAGNOSIS — M25562 Pain in left knee: Secondary | ICD-10-CM

## 2012-10-17 DIAGNOSIS — Z79899 Other long term (current) drug therapy: Secondary | ICD-10-CM | POA: Insufficient documentation

## 2012-10-17 DIAGNOSIS — S8990XA Unspecified injury of unspecified lower leg, initial encounter: Secondary | ICD-10-CM | POA: Insufficient documentation

## 2012-10-17 DIAGNOSIS — Y939 Activity, unspecified: Secondary | ICD-10-CM | POA: Insufficient documentation

## 2012-10-17 MED ORDER — TRAMADOL HCL 50 MG PO TABS: 100.0000 mg | ORAL_TABLET | Freq: Four times a day (QID) | ORAL | Status: DC | PRN

## 2012-10-17 MED ORDER — SULFAMETHOXAZOLE-TRIMETHOPRIM 800-160 MG PO TABS: 1.0000 | ORAL_TABLET | Freq: Two times a day (BID) | ORAL | Status: DC

## 2012-10-17 MED ORDER — KETOROLAC TROMETHAMINE 60 MG/2ML IM SOLN
60.0000 mg | Freq: Once | INTRAMUSCULAR | Status: AC
  Administered 2012-10-17: 60 mg via INTRAMUSCULAR
  Filled 2012-10-17: qty 2

## 2012-10-17 NOTE — Telephone Encounter (Signed)
Left message the rx called in to [facility]

## 2012-10-17 NOTE — ED Notes (Signed)
Pt slipped and twisted left knee a week ago.

## 2012-10-17 NOTE — Telephone Encounter (Signed)
No answer. JSY 

## 2012-10-17 NOTE — ED Provider Notes (Signed)
History  This chart was scribed for Ward Givens, MD by Bennett Scrape, ED Scribe. This patient was seen in room APA04/APA04 and the patient's care was started at 8:53 AM.  CSN: [id number]  Arrival date & time 10/17/12  0843   First MD Initiated Contact with Patient 10/17/12 [phone]     Chief Complaint  Patient presents with  . Knee Injury    Patient is a 52 y.o. female presenting with knee pain. The history is provided by the patient. No language interpreter was used.  Knee Pain Location:  Knee Time since incident:  7 days Injury: yes   Mechanism of injury comment:  Twisting injury Knee location:  L knee Pain details:    Radiates to: left buttocks.   Severity:  Moderate   Onset quality:  Sudden   Duration:  7 days   Timing:  Constant   Progression:  Worsening Chronicity:  Chronic Dislocation: no   Foreign body present:  No foreign bodies Relieved by:  Nothing Worsened by:  Extension, flexion and activity Ineffective treatments:  Acetaminophen, ice and rest Associated symptoms: no back pain and no fever     HPI Comments: Annette Galvan is a 52 y.o. female who presents to the Emergency Department complaining of sudden worsening of chronic left knee pain after a twisting injury. Pt states that she has a h/o arthritis and has been following up with her orthopedist for a right knee replacement but states that she needs to lose 35 lbs first. She reports that she sprayed her hardwood floor with floor cleaner and the stepped on the area in flip flops twisting her left knee. She states that she caught herself on the corner of the washing machine and denies head trauma or LOC. She reports that currently the pain radiates into left buttocks and is worse with bending the knee. She states that she has not called Dr. Sherlean Foot for the symptoms yet. She reports having a h/o arthritis in knees bilaterally but states that usually the right is worse than the left. She reports that she rested for 2 days,  used ice and has tried motrin with no improvement. She also has a prescription for Voltaren gel prescribed by chronic pain clinic, last visit was 2 years ago. She denies that she is followed closely by the chronic pain clinic and states that she was referred there by her orthopedist. She denies fevers and chills. She has a h/o bipolar disorder. She states she has knee immobilizers for both knees to wear but she left them at home today.   Pt has prior ED visits for chronic knee pain, more for the right than the left.  Dr. Sherlean Foot is Orthopedist PCP is Dr. Jeanice Lim  Past Medical History  Diagnosis Date  . Bipolar 1 disorder   . Back pain, chronic   . Leg pain   . HSV-2 (herpes simplex virus 2) infection 2013  . Bilateral chronic knee pain   . Fibromyalgia   . Hx of trichomoniasis   . Arthritis   . Bulging disc   . UTI (lower urinary tract infection) 10/12/2012   Past Surgical History  Procedure Laterality Date  . Abdominal hysterectomy    . Breast enhancement surgery     Family History  Problem Relation Age of Onset  . Hypertension Mother   . Diabetes Mother   . Depression Mother   . Hyperlipidemia Mother   . Fibromyalgia Mother   . Cancer Father  bone  . Depression Father   . Hyperlipidemia Father   . Hypertension Father   . Depression Sister   . Hyperlipidemia Sister   . Fibromyalgia Sister   . Fibromyalgia Sister    History  Substance Use Topics  . Smoking status: Current Every Day Smoker -- 0.30 packs/day    Types: Cigarettes  . Smokeless tobacco: Never Used  . Alcohol Use: Yes     Comment: beer occassionally  on disability for bipolar and chronic knee pain  OB History   Grav Para Term Preterm Abortions TAB SAB Ect Mult Living   2 2        2      Review of Systems  Constitutional: Negative for fever and chills.  Musculoskeletal: Positive for arthralgias. Negative for back pain and joint swelling.    Allergies  Latex and Ampicillin  Home Medications    Current Outpatient Rx  Name  Route  Sig  Dispense  Refill  . ALPRAZolam (XANAX) 1 MG tablet   Oral   Take 1 mg by mouth 4 (four) times daily as needed for anxiety.          . cetirizine (ZYRTEC) 10 MG tablet   Oral   Take 10 mg by mouth daily.         Marland Kitchen gabapentin (NEURONTIN) 400 MG capsule   Oral   Take 400 mg by mouth 3 (three) times daily.          Marland Kitchen ibuprofen (ADVIL,MOTRIN) 800 MG tablet   Oral   Take 1 tablet (800 mg total) by mouth 2 (two) times daily as needed for pain.   60 tablet   3     Discontinue the naprosyn   . lamoTRIgine (LAMICTAL) 100 MG tablet   Oral   Take 100 mg by mouth 2 (two) times daily.         Marland Kitchen omeprazole (PRILOSEC) 20 MG capsule   Oral   Take 20 mg by mouth every morning.         Marland Kitchen OVER THE COUNTER MEDICATION   Oral   Take 1 tablet by mouth 3 (three) times daily. Herbal Life Multivitamin         . pregabalin (LYRICA) 75 MG capsule   Oral   Take 1 capsule (75 mg total) by mouth 2 (two) times daily.   60 capsule   3     Tapering off the gabapentin   . sulfamethoxazole-trimethoprim (BACTRIM DS,SEPTRA DS) 800-160 MG per tablet   Oral   Take 1 tablet by mouth 2 (two) times daily.   14 tablet   0   . traZODone (DESYREL) 150 MG tablet   Oral   Take 300 mg by mouth at bedtime.         . VOLTAREN 1 % GEL   Topical   Apply 4 g topically 2 (two) times daily as needed (Pain).           Triage Vitals: BP 133/77  Pulse 97  Temp(Src) 98.3 F (36.8 C) (Oral)  Resp 20  Ht 5\' 4"  (1.626 m)  Wt 268 lb (121.564 kg)  BMI 45.98 kg/m2  SpO2 98%  Vital signs normal    Physical Exam  Nursing note and vitals reviewed. Constitutional: She is oriented to person, place, and time. She appears well-developed and well-nourished.  Non-toxic appearance. She does not appear ill. No distress.  obese  HENT:  Head: Normocephalic and atraumatic.  Right Ear: External ear normal.  Left Ear: External ear normal.  Nose: Nose normal. No  mucosal edema or rhinorrhea.  Mouth/Throat: Oropharynx is clear and moist and mucous membranes are normal. No dental abscesses or edematous.  Eyes: Conjunctivae and EOM are normal. Pupils are equal, round, and reactive to light.  Neck: Normal range of motion and full passive range of motion without pain. Neck supple.  Cardiovascular: Exam reveals no gallop and no friction rub.   No murmur heard. Pulmonary/Chest: Effort normal. No respiratory distress. She has no rhonchi. She exhibits no crepitus.  Abdominal: Normal appearance. There is no guarding.  Musculoskeletal: Normal range of motion. She exhibits no edema and no tenderness.  No joint effusion of the left knee, patella feels stable  Neurological: She is alert and oriented to person, place, and time. She has normal strength. No cranial nerve deficit.  Skin: Skin is warm, dry and intact. No rash noted. No erythema. No pallor.  Psychiatric: She has a normal mood and affect. Her speech is normal and behavior is normal. Her mood appears not anxious.    ED Course  Procedures (including critical care time)  Medications  ketorolac (TORADOL) injection 60 mg (60 mg Intramuscular Given 10/17/12 0935)    DIAGNOSTIC STUDIES: Oxygen Saturation is 98% on room air, normal by my interpretation.    COORDINATION OF CARE: 9:16 AM-Informed pt that smoking aggravates chronic pain.  9:19 AM-Discussed treatment plan which includes medication, x-rays and knee immobilizer with pt and pt agreed to pain medication and xrays. Pt decline the immobilizer stating that she has 2 at home. She reports that she didn't have time to put one on this morning.  PT has been coming to the ED for knee pain, this is her 6th visit in 1 year  Dg Knee Complete 4 Views Left  10/17/2012   *RADIOLOGY REPORT*  Clinical Data: Knee injury  LEFT KNEE - COMPLETE 4+ VIEW  Comparison: 09/26/2011  Findings: There is no joint effusion.  No fracture or subluxation identified.  Moderate to  marked tricompartment osteoarthritis is identified.  No radiopaque foreign bodies or soft tissue calcifications.  IMPRESSION:  1.  No acute findings. 2.  Osteoarthritis.   Original Report Authenticated By: Signa Kell, M.D.    1. Knee pain, acute, left    New Prescriptions   TRAMADOL (ULTRAM) 50 MG TABLET    Take 2 tablets (100 mg total) by mouth every 6 (six) hours as needed.     Plan discharge   Devoria Albe, MD, FACEP   MDM   I personally performed the services described in this documentation, which was scribed in my presence. The recorded information has been reviewed and considered.  Devoria Albe, MD, Armando Gang    Ward Givens, MD 10/17/12 1028

## 2012-11-01 ENCOUNTER — Telehealth: Payer: Self-pay | Admitting: Gastroenterology

## 2012-11-01 ENCOUNTER — Ambulatory Visit: Payer: PRIVATE HEALTH INSURANCE | Admitting: Gastroenterology

## 2012-11-01 NOTE — Telephone Encounter (Signed)
Patient is a no show

## 2012-11-12 ENCOUNTER — Encounter: Payer: Self-pay | Admitting: Gastroenterology

## 2012-11-12 NOTE — Telephone Encounter (Signed)
Please send note for f/u 

## 2012-11-12 NOTE — Telephone Encounter (Signed)
Mailed letter for patient to call our office to RSC her OV. °

## 2012-12-10 ENCOUNTER — Ambulatory Visit: Payer: PRIVATE HEALTH INSURANCE | Admitting: Adult Health

## 2012-12-13 ENCOUNTER — Other Ambulatory Visit (HOSPITAL_COMMUNITY): Payer: Self-pay | Admitting: Obstetrics & Gynecology

## 2012-12-20 ENCOUNTER — Telehealth: Payer: Self-pay | Admitting: Family Medicine

## 2012-12-20 MED ORDER — OMEPRAZOLE 20 MG PO CPDR: 20.0000 mg | DELAYED_RELEASE_CAPSULE | Freq: Every morning | ORAL | Status: DC

## 2012-12-20 MED ORDER — CETIRIZINE HCL 10 MG PO TABS: 10.0000 mg | ORAL_TABLET | Freq: Every day | ORAL | Status: DC

## 2012-12-20 NOTE — Telephone Encounter (Signed)
Meds refilled.

## 2012-12-20 NOTE — Telephone Encounter (Signed)
Omeprazole 20 mg cap 1 QD #30 Cetirizine HCL 10 mg tab 1 QD #30

## 2012-12-21 ENCOUNTER — Ambulatory Visit: Payer: PRIVATE HEALTH INSURANCE | Admitting: Obstetrics & Gynecology

## 2012-12-21 ENCOUNTER — Other Ambulatory Visit: Payer: Self-pay | Admitting: Adult Health

## 2012-12-25 ENCOUNTER — Ambulatory Visit (INDEPENDENT_AMBULATORY_CARE_PROVIDER_SITE_OTHER): Payer: PRIVATE HEALTH INSURANCE | Admitting: Gastroenterology

## 2012-12-25 ENCOUNTER — Encounter: Payer: Self-pay | Admitting: Gastroenterology

## 2012-12-25 VITALS — BP 140/90 | HR 82 | Temp 98.4°F | Ht 65.0 in | Wt 267.5 lb

## 2012-12-25 DIAGNOSIS — Z1211 Encounter for screening for malignant neoplasm of colon: Secondary | ICD-10-CM

## 2012-12-25 MED ORDER — PEG 3350-KCL-NA BICARB-NACL 420 G PO SOLR: 4000.0000 mL | ORAL | Status: DC

## 2012-12-25 NOTE — Progress Notes (Signed)
Referring Provider: Salley Scarlet, MD Primary Care Physician:  Milinda Antis, MD Primary GI: Dr. Jena Gauss   Chief Complaint  Patient presents with  . Colonoscopy    HPI:   52 year old female presents today as a referral from Dr. Jeanice Lim for screening colonoscopy. Denies constipation, diarrhea, lack of appetite, or weight loss. Notes occasional painless low-volume hematochezia. Occasional nausea if she does not take her PPI daily. Otherwise, she has no complaints. Denies any further upper GI symptoms other than controlled reflux.    Past Medical History  Diagnosis Date  . Bipolar 1 disorder   . Back pain, chronic   . Leg pain   . HSV-2 (herpes simplex virus 2) infection 2013  . Bilateral chronic knee pain   . Fibromyalgia   . Hx of trichomoniasis   . Arthritis     needs 2 knee replacements  . Bulging disc   . UTI (lower urinary tract infection) 10/12/2012  . GERD (gastroesophageal reflux disease)     Past Surgical History  Procedure Laterality Date  . Abdominal hysterectomy    . Breast enhancement surgery    . Tubal ligation      Current Outpatient Prescriptions  Medication Sig Dispense Refill  . ALPRAZolam (XANAX) 1 MG tablet Take 1 mg by mouth 4 (four) times daily as needed for anxiety.       . cetirizine (ZYRTEC) 10 MG tablet Take 1 tablet (10 mg total) by mouth daily.  30 tablet  2  . gabapentin (NEURONTIN) 400 MG capsule Take 400 mg by mouth 3 (three) times daily.       Marland Kitchen lamoTRIgine (LAMICTAL) 100 MG tablet Take 100 mg by mouth 2 (two) times daily.      Marland Kitchen omeprazole (PRILOSEC) 20 MG capsule Take 1 capsule (20 mg total) by mouth every morning.  30 capsule  6  . OVER THE COUNTER MEDICATION Take 1 tablet by mouth 3 (three) times daily. Herbal Life Multivitamin      . pregabalin (LYRICA) 75 MG capsule Take 1 capsule (75 mg total) by mouth 2 (two) times daily.  60 capsule  3  . traMADol (ULTRAM) 50 MG tablet Take 2 tablets (100 mg total) by mouth every 6 (six)  hours as needed.  16 tablet  0  . traZODone (DESYREL) 150 MG tablet Take 300 mg by mouth at bedtime.      . VOLTAREN 1 % GEL Apply 4 g topically 2 (two) times daily as needed (Pain).       . polyethylene glycol-electrolytes (TRILYTE) 420 G solution Take 4,000 mLs by mouth as directed.  4000 mL  0  . sulfamethoxazole-trimethoprim (BACTRIM DS) 800-160 MG per tablet TAKE (1) TABLET BY MOUTH TWICE DAILY FOR 7 DAYS.  14 tablet  0   No current facility-administered medications for this visit.    Allergies as of 12/25/2012 - Review Complete 12/25/2012  Allergen Reaction Noted  . Latex Swelling 10/17/2012  . Ampicillin Hives and Nausea And Vomiting     Family History  Problem Relation Age of Onset  . Hypertension Mother   . Diabetes Mother   . Depression Mother   . Hyperlipidemia Mother   . Fibromyalgia Mother   . Cancer Father     bone  . Depression Father   . Hyperlipidemia Father   . Hypertension Father   . Depression Sister   . Hyperlipidemia Sister   . Fibromyalgia Sister   . Fibromyalgia Sister   . Colon cancer Neg  Hx     History   Social History  . Marital Status: Widowed    Spouse Name: N/A    Number of Children: N/A  . Years of Education: N/A   Social History Main Topics  . Smoking status: Current Every Day Smoker -- 0.30 packs/day    Types: Cigarettes  . Smokeless tobacco: Never Used  . Alcohol Use: Yes     Comment: beer occasionally  . Drug Use: Yes     Comment: cocaine, clean for 1.5 years  . Sexual Activity: Yes    Birth Control/ Protection: Surgical   Other Topics Concern  . None   Social History Narrative  . None    Review of Systems: Negative unless mentioned in HPI.   Physical Exam: BP 140/90  Pulse 82  Temp(Src) 98.4 F (36.9 C) (Oral)  Ht 5\' 5"  (1.651 m)  Wt 267 lb 8 oz (121.337 kg)  BMI 44.51 kg/m2 General:   Alert and oriented. No distress noted. Pleasant and cooperative.  Head:  Normocephalic and atraumatic. Eyes:  Conjuctiva  clear without scleral icterus. Mouth:  Oral mucosa pink and moist. Good dentition. No lesions. Neck:  Supple, without mass or thyromegaly. Heart:  S1, S2 present without murmurs, rubs, or gallops. Regular rate and rhythm. Abdomen:  +BS, soft, non-tender and non-distended. No rebound or guarding. No HSM or masses noted. Msk:  Symmetrical without gross deformities. Normal posture. Extremities:  Without edema. Neurologic:  Alert and  oriented x4;  grossly normal neurologically. Skin:  Intact without significant lesions or rashes. Cervical Nodes:  No significant cervical adenopathy. Psych:  Alert and cooperative. Normal mood and affect.

## 2012-12-25 NOTE — Patient Instructions (Addendum)
We have scheduled you for a colonoscopy with Dr. Rourk in the near future.  Further recommendations to follow!   

## 2012-12-26 DIAGNOSIS — Z1211 Encounter for screening for malignant neoplasm of colon: Secondary | ICD-10-CM | POA: Insufficient documentation

## 2012-12-26 DIAGNOSIS — Z7689 Persons encountering health services in other specified circumstances: Secondary | ICD-10-CM | POA: Insufficient documentation

## 2012-12-26 NOTE — Assessment & Plan Note (Signed)
52 year old female with need for initial screening colonoscopy, noting low-volume painless hematochezia intermittently. Likely benign source.   Proceed with TCS with Dr. Jena Gauss in near future: the risks, benefits, and alternatives have been discussed with the patient in detail. The patient states understanding and desires to proceed. Phenergan 25 mg IV on call due to polypharmacy.

## 2012-12-26 NOTE — Progress Notes (Signed)
CC'd to PCP 

## 2012-12-27 ENCOUNTER — Encounter (HOSPITAL_COMMUNITY): Payer: Self-pay | Admitting: Pharmacy Technician

## 2012-12-28 ENCOUNTER — Encounter: Payer: Self-pay | Admitting: Adult Health

## 2012-12-28 ENCOUNTER — Ambulatory Visit (INDEPENDENT_AMBULATORY_CARE_PROVIDER_SITE_OTHER): Payer: PRIVATE HEALTH INSURANCE | Admitting: Adult Health

## 2012-12-28 VITALS — BP 120/82 | Ht 65.0 in | Wt 267.0 lb

## 2012-12-28 DIAGNOSIS — R350 Frequency of micturition: Secondary | ICD-10-CM

## 2012-12-28 DIAGNOSIS — A499 Bacterial infection, unspecified: Secondary | ICD-10-CM

## 2012-12-28 DIAGNOSIS — B9689 Other specified bacterial agents as the cause of diseases classified elsewhere: Secondary | ICD-10-CM

## 2012-12-28 DIAGNOSIS — N898 Other specified noninflammatory disorders of vagina: Secondary | ICD-10-CM

## 2012-12-28 DIAGNOSIS — N949 Unspecified condition associated with female genital organs and menstrual cycle: Secondary | ICD-10-CM

## 2012-12-28 DIAGNOSIS — N760 Acute vaginitis: Secondary | ICD-10-CM

## 2012-12-28 HISTORY — DX: Other specified noninflammatory disorders of vagina: N89.8

## 2012-12-28 LAB — POCT WET PREP (WET MOUNT)

## 2012-12-28 MED ORDER — METRONIDAZOLE 500 MG PO TABS: 500.0000 mg | ORAL_TABLET | Freq: Two times a day (BID) | ORAL | Status: DC

## 2012-12-28 NOTE — Progress Notes (Signed)
Subjective:     Patient ID: Annette Galvan, female   DOB: November 22, 1960, 52 y.o.   MRN: [id number]  HPI Annette Galvan is a 52 year old white female in complaining of urinary frequency and odor.  Review of Systems See HPI Reviewed past medical,surgical, social and family history. Reviewed medications and allergies.     Objective:   Physical Exam BP 120/82  Ht 5\' 5"  (1.651 m)  Wt 267 lb (121.11 kg)  BMI 44.43 kg/m2urine negative. Skin warm and dry.Pelvic: external genitalia is normal in appearance, vagina: white discharge with odor, cervix and uterus are absent, adnexa: no masses or tenderness noted. Wet prep: + for clue cells and +WBCs    Assessment:     Urinary frquency Vaginal odor BV    Plan:     Rx flagyl 500 mg 1 bid x 7 days, no alcohol, review handout on BV, no sex Try rephresh Number of samples 4  Lot number [id number]    Exp date 3/17   follow up prn

## 2012-12-28 NOTE — Patient Instructions (Addendum)
Bacterial Vaginosis Bacterial vaginosis (BV) is a vaginal infection where the normal balance of bacteria in the vagina is disrupted. The normal balance is then replaced by an overgrowth of certain bacteria. There are several different kinds of bacteria that can cause BV. BV is the most common vaginal infection in women of childbearing age. CAUSES   The cause of BV is not fully understood. BV develops when there is an increase or imbalance of harmful bacteria.  Some activities or behaviors can upset the normal balance of bacteria in the vagina and put women at increased risk including:  Having a new sex partner or multiple sex partners.  Douching.  Using an intrauterine device (IUD) for contraception.  It is not clear what role sexual activity plays in the development of BV. However, women that have never had sexual intercourse are rarely infected with BV. Women do not get BV from toilet seats, bedding, swimming pools or from touching objects around them.  SYMPTOMS   Grey vaginal discharge.  A fish-like odor with discharge, especially after sexual intercourse.  Itching or burning of the vagina and vulva.  Burning or pain with urination.  Some women have no signs or symptoms at all. DIAGNOSIS  Your caregiver must examine the vagina for signs of BV. Your caregiver will perform lab tests and look at the sample of vaginal fluid through a microscope. They will look for bacteria and abnormal cells (clue cells), a pH test higher than 4.5, and a positive amine test all associated with BV.  RISKS AND COMPLICATIONS   Pelvic inflammatory disease (PID).  Infections following gynecology surgery.  Developing HIV.  Developing herpes virus. TREATMENT  Sometimes BV will clear up without treatment. However, all women with symptoms of BV should be treated to avoid complications, especially if gynecology surgery is planned. Female partners generally do not need to be treated. However, BV may spread  between female sex partners so treatment is helpful in preventing a recurrence of BV.   BV may be treated with antibiotics. The antibiotics come in either pill or vaginal cream forms. Either can be used with nonpregnant or pregnant women, but the recommended dosages differ. These antibiotics are not harmful to the baby.  BV can recur after treatment. If this happens, a second round of antibiotics will often be prescribed.  Treatment is important for pregnant women. If not treated, BV can cause a premature delivery, especially for a pregnant woman who had a premature birth in the past. All pregnant women who have symptoms of BV should be checked and treated.  For chronic reoccurrence of BV, treatment with a type of prescribed gel vaginally twice a week is helpful. HOME CARE INSTRUCTIONS   Finish all medication as directed by your caregiver.  Do not have sex until treatment is completed.  Tell your sexual partner that you have a vaginal infection. They should see their caregiver and be treated if they have problems, such as a mild rash or itching.  Practice safe sex. Use condoms. Only have 1 sex partner. PREVENTION  Basic prevention steps can help reduce the risk of upsetting the natural balance of bacteria in the vagina and developing BV:  Do not have sexual intercourse (be abstinent).  Do not douche.  Use all of the medicine prescribed for treatment of BV, even if the signs and symptoms go away.  Tell your sex partner if you have BV. That way, they can be treated, if needed, to prevent reoccurrence. SEEK MEDICAL CARE IF:     Your symptoms are not improving after 3 days of treatment.  You have increased discharge, pain, or fever. MAKE SURE YOU:   Understand these instructions.  Will watch your condition.  Will get help right away if you are not doing well or get worse. FOR MORE INFORMATION  Division of STD Prevention (DSTDP), Centers for Disease Control and Prevention:  [employer]s.[employer] American Social Health Association (ASHA): www.ashastd.org  Document Released: 04/04/2005 Document Revised: 06/27/2011 Document Reviewed: 09/25/2008 [facility] Patient Information 2014 [place], [place]. Take flagyl No sex no alcohol Try rephresh Follow up prn

## 2013-01-03 ENCOUNTER — Telehealth: Payer: Self-pay | Admitting: Gastroenterology

## 2013-01-03 ENCOUNTER — Ambulatory Visit (HOSPITAL_COMMUNITY)
Admission: RE | Admit: 2013-01-03 | Payer: PRIVATE HEALTH INSURANCE | Source: Ambulatory Visit | Admitting: Internal Medicine

## 2013-01-03 ENCOUNTER — Other Ambulatory Visit: Payer: Self-pay | Admitting: Family Medicine

## 2013-01-03 SURGERY — COLONOSCOPY
Anesthesia: Moderate Sedation

## 2013-01-03 NOTE — Telephone Encounter (Signed)
Melanie from Endo called stating Mrs. Ivey called over to Endo early this morning and cancelled her TCS due to having flu like symptoms

## 2013-01-03 NOTE — Telephone Encounter (Signed)
Noted  

## 2013-01-10 ENCOUNTER — Encounter (HOSPITAL_COMMUNITY): Payer: Self-pay

## 2013-01-10 ENCOUNTER — Other Ambulatory Visit: Payer: Self-pay

## 2013-01-10 ENCOUNTER — Emergency Department (HOSPITAL_COMMUNITY)
Admission: EM | Admit: 2013-01-10 | Discharge: 2013-01-10 | Disposition: A | Discharge status: Home / Self Care | Payer: PRIVATE HEALTH INSURANCE | Attending: Emergency Medicine | Admitting: Emergency Medicine

## 2013-01-10 DIAGNOSIS — K219 Gastro-esophageal reflux disease without esophagitis: Secondary | ICD-10-CM | POA: Insufficient documentation

## 2013-01-10 DIAGNOSIS — Z79899 Other long term (current) drug therapy: Secondary | ICD-10-CM | POA: Insufficient documentation

## 2013-01-10 DIAGNOSIS — Z8742 Personal history of other diseases of the female genital tract: Secondary | ICD-10-CM | POA: Insufficient documentation

## 2013-01-10 DIAGNOSIS — Y9389 Activity, other specified: Secondary | ICD-10-CM | POA: Insufficient documentation

## 2013-01-10 DIAGNOSIS — Z91040 Latex allergy status: Secondary | ICD-10-CM | POA: Insufficient documentation

## 2013-01-10 DIAGNOSIS — L089 Local infection of the skin and subcutaneous tissue, unspecified: Secondary | ICD-10-CM | POA: Insufficient documentation

## 2013-01-10 DIAGNOSIS — Y929 Unspecified place or not applicable: Secondary | ICD-10-CM | POA: Insufficient documentation

## 2013-01-10 DIAGNOSIS — Z8619 Personal history of other infectious and parasitic diseases: Secondary | ICD-10-CM | POA: Insufficient documentation

## 2013-01-10 DIAGNOSIS — G8929 Other chronic pain: Secondary | ICD-10-CM | POA: Insufficient documentation

## 2013-01-10 DIAGNOSIS — F319 Bipolar disorder, unspecified: Secondary | ICD-10-CM | POA: Insufficient documentation

## 2013-01-10 DIAGNOSIS — F17200 Nicotine dependence, unspecified, uncomplicated: Secondary | ICD-10-CM | POA: Insufficient documentation

## 2013-01-10 DIAGNOSIS — Z8739 Personal history of other diseases of the musculoskeletal system and connective tissue: Secondary | ICD-10-CM | POA: Insufficient documentation

## 2013-01-10 DIAGNOSIS — Z792 Long term (current) use of antibiotics: Secondary | ICD-10-CM | POA: Insufficient documentation

## 2013-01-10 DIAGNOSIS — IMO0001 Reserved for inherently not codable concepts without codable children: Secondary | ICD-10-CM | POA: Insufficient documentation

## 2013-01-10 DIAGNOSIS — Z87440 Personal history of urinary (tract) infections: Secondary | ICD-10-CM | POA: Insufficient documentation

## 2013-01-10 MED ORDER — SULFAMETHOXAZOLE-TRIMETHOPRIM 800-160 MG PO TABS: 2.0000 | ORAL_TABLET | Freq: Two times a day (BID) | ORAL | Status: DC

## 2013-01-10 MED ORDER — HYDROCODONE-ACETAMINOPHEN 5-325 MG PO TABS: 1.0000 | ORAL_TABLET | ORAL | Status: DC | PRN

## 2013-01-10 NOTE — ED Notes (Signed)
Pt reports felt something on left side and noticed a bug bite Monday.  Says as the day progressed, the area got bigger and was turning red and swollen.   Pt says now can't lay on left side and pain  Moves around to chest and back.

## 2013-01-10 NOTE — ED Notes (Signed)
Pt c/o left side chest wall pain from a ?bug bite that she first noted yesterday. Pt walked to the bathroom and c/o mild nausea, and pain that radiates to left shoulder area. ekg completed. Pa idol notified of pt status.

## 2013-01-10 NOTE — ED Provider Notes (Signed)
CSN: [id number]     Arrival date & time 01/10/13  7829 History   First MD Initiated Contact with Patient 01/10/13 [phone]     Chief Complaint  Patient presents with  . Insect Bite   (Consider location/radiation/quality/duration/timing/severity/associated sxs/prior Treatment) HPI Comments: Annette Galvan is a 52 y.o. Female presenting with an increasingly painful insect bite to her left chest wall which has been present for the past 4 days.  She was working outside her home getting cobwebs down from her screens when she felt a sudden sharp pain along her left chest wall at her brought line.  Since that time she has had increased pain with radiation into her left shoulder but also down into her lower chest area.  There is increased redness, swelling.  There is no drainage from the site, she denies nausea or vomiting, has had no fevers or chills.  She has taken no medications prior to arrival for this problem.     The history is provided by the patient.    Past Medical History  Diagnosis Date  . Bipolar 1 disorder   . Back pain, chronic   . Leg pain   . HSV-2 (herpes simplex virus 2) infection 2013  . Bilateral chronic knee pain   . Fibromyalgia   . Hx of trichomoniasis   . Arthritis     needs 2 knee replacements  . Bulging disc   . UTI (lower urinary tract infection) 10/12/2012  . GERD (gastroesophageal reflux disease)   . Vaginal odor 12/28/2012    Had discharge +clue   Past Surgical History  Procedure Laterality Date  . Abdominal hysterectomy    . Breast enhancement surgery    . Tubal ligation     Family History  Problem Relation Age of Onset  . Hypertension Mother   . Diabetes Mother   . Depression Mother   . Hyperlipidemia Mother   . Fibromyalgia Mother   . Cancer Father     bone  . Depression Father   . Hyperlipidemia Father   . Hypertension Father   . Depression Sister   . Hyperlipidemia Sister   . Fibromyalgia Sister   . Fibromyalgia Sister   . Colon cancer Neg  Hx    History  Substance Use Topics  . Smoking status: Current Every Day Smoker -- 0.30 packs/day    Types: Cigarettes  . Smokeless tobacco: Never Used  . Alcohol Use: Yes     Comment: beer occasionally   OB History   Grav Para Term Preterm Abortions TAB SAB Ect Mult Living   2 2        2      Review of Systems  Constitutional: Negative for fever and chills.  HENT: Negative for facial swelling.   Respiratory: Negative for shortness of breath and wheezing.   Skin: Positive for color change and wound.  Neurological: Negative for numbness.    Allergies  Latex and Ampicillin  Home Medications   Current Outpatient Rx  Name  Route  Sig  Dispense  Refill  . ALPRAZolam (XANAX) 1 MG tablet   Oral   Take 1 mg by mouth 4 (four) times daily as needed for anxiety.          . cetirizine (ZYRTEC) 10 MG tablet   Oral   Take 1 tablet (10 mg total) by mouth daily.   30 tablet   2   . gabapentin (NEURONTIN) 400 MG capsule   Oral   Take 400 mg  by mouth 3 (three) times daily.          Marland Kitchen lamoTRIgine (LAMICTAL) 100 MG tablet   Oral   Take 100 mg by mouth 2 (two) times daily.         Marland Kitchen omeprazole (PRILOSEC) 20 MG capsule   Oral   Take 1 capsule (20 mg total) by mouth every morning.   30 capsule   6   . pregabalin (LYRICA) 75 MG capsule   Oral   Take 1 capsule (75 mg total) by mouth 2 (two) times daily.   60 capsule   3     Tapering off the gabapentin   . traZODone (DESYREL) 150 MG tablet   Oral   Take 300 mg by mouth at bedtime.         . VOLTAREN 1 % GEL   Topical   Apply 4 g topically 2 (two) times daily as needed (Pain).          Marland Kitchen HYDROcodone-acetaminophen (NORCO/VICODIN) 5-325 MG per tablet   Oral   Take 1 tablet by mouth every 4 (four) hours as needed for pain.   15 tablet   0   . metroNIDAZOLE (FLAGYL) 500 MG tablet   Oral   Take 1 tablet (500 mg total) by mouth 2 (two) times daily.   14 tablet   0   . sulfamethoxazole-trimethoprim (SEPTRA DS)  800-160 MG per tablet   Oral   Take 2 tablets by mouth 2 (two) times daily.   28 tablet   0    BP 127/67  Pulse 72  Temp(Src) 97.9 F (36.6 C) (Oral)  Resp 18  Ht 5\' 5"  (1.651 m)  Wt 268 lb (121.564 kg)  BMI 44.6 kg/m2  SpO2 100% Physical Exam  Constitutional: She appears well-developed and well-nourished. No distress.  HENT:  Head: Normocephalic.  Neck: Neck supple.  Cardiovascular: Normal rate.   Pulmonary/Chest: Effort normal. She has no wheezes.  Musculoskeletal: Normal range of motion. She exhibits no edema.  Skin: There is erythema.  Approximate for centimeter by 2 cm area of induration and erythema left chest wall at the midaxillary line.  There is a central small scab present which is not draining.  There is no fluctuance, no lymphangitis.  No axillary adenopathy present.    ED Course  Procedures (including critical care time)   Date: 01/10/2013  Rate: 69  Rhythm: normal sinus rhythm  QRS Axis: normal  Intervals: normal  ST/T Wave abnormalities: normal  Conduction Disutrbances:none  Narrative Interpretation:   Old EKG Reviewed: unchanged    Informal bedside ultrasound performed with no abscess appreciated. Labs Review Labs Reviewed - No data to display Imaging Review No results found.  MDM   1. Insect bite, nonvenomous, of chest wall, infected, left, initial encounter    Probable localized reaction to insect/spider bite.  However with increased pain and redness will cover for possible localized skin infection.  She was prescribed Bactrim, prescribed small quantity of hydrocodone.  She is also encouraged to use warm compresses to the site several times daily.  Followup with PCP or return here for any worsened symptoms.    Burgess Amor, PA-C 01/10/13 1050

## 2013-01-11 ENCOUNTER — Encounter (HOSPITAL_COMMUNITY): Payer: Self-pay

## 2013-01-11 ENCOUNTER — Emergency Department (HOSPITAL_COMMUNITY)
Admission: EM | Admit: 2013-01-11 | Discharge: 2013-01-11 | Disposition: A | Discharge status: Home / Self Care | Payer: PRIVATE HEALTH INSURANCE | Attending: Emergency Medicine | Admitting: Emergency Medicine

## 2013-01-11 DIAGNOSIS — Z8742 Personal history of other diseases of the female genital tract: Secondary | ICD-10-CM | POA: Insufficient documentation

## 2013-01-11 DIAGNOSIS — Z87440 Personal history of urinary (tract) infections: Secondary | ICD-10-CM | POA: Insufficient documentation

## 2013-01-11 DIAGNOSIS — IMO0001 Reserved for inherently not codable concepts without codable children: Secondary | ICD-10-CM | POA: Insufficient documentation

## 2013-01-11 DIAGNOSIS — L0291 Cutaneous abscess, unspecified: Secondary | ICD-10-CM

## 2013-01-11 DIAGNOSIS — F319 Bipolar disorder, unspecified: Secondary | ICD-10-CM | POA: Insufficient documentation

## 2013-01-11 DIAGNOSIS — Z91040 Latex allergy status: Secondary | ICD-10-CM | POA: Insufficient documentation

## 2013-01-11 DIAGNOSIS — Z4800 Encounter for change or removal of nonsurgical wound dressing: Secondary | ICD-10-CM | POA: Insufficient documentation

## 2013-01-11 DIAGNOSIS — Z8619 Personal history of other infectious and parasitic diseases: Secondary | ICD-10-CM | POA: Insufficient documentation

## 2013-01-11 DIAGNOSIS — M129 Arthropathy, unspecified: Secondary | ICD-10-CM | POA: Insufficient documentation

## 2013-01-11 DIAGNOSIS — F17200 Nicotine dependence, unspecified, uncomplicated: Secondary | ICD-10-CM | POA: Insufficient documentation

## 2013-01-11 DIAGNOSIS — Z79899 Other long term (current) drug therapy: Secondary | ICD-10-CM | POA: Insufficient documentation

## 2013-01-11 DIAGNOSIS — K219 Gastro-esophageal reflux disease without esophagitis: Secondary | ICD-10-CM | POA: Insufficient documentation

## 2013-01-11 DIAGNOSIS — L02219 Cutaneous abscess of trunk, unspecified: Secondary | ICD-10-CM | POA: Insufficient documentation

## 2013-01-11 DIAGNOSIS — G8929 Other chronic pain: Secondary | ICD-10-CM | POA: Insufficient documentation

## 2013-01-11 LAB — CBC WITH DIFFERENTIAL/PLATELET
Basophils Relative: 0 % (ref 0–1)
Hemoglobin: 12.1 g/dL (ref 12.0–15.0)
Lymphocytes Relative: 16 % (ref 12–46)
Lymphs Abs: 1.7 10*3/uL (ref 0.7–4.0)
MCH: 30.3 pg (ref 26.0–34.0)
MCHC: 32.4 g/dL (ref 30.0–36.0)
Monocytes Relative: 10 % (ref 3–12)
Neutro Abs: 7.8 10*3/uL — ABNORMAL HIGH (ref 1.7–7.7)
Neutrophils Relative %: 73 % (ref 43–77)
Platelets: 250 10*3/uL (ref 150–400)
RBC: 3.99 MIL/uL (ref 3.87–5.11)
RDW: 14 % (ref 11.5–15.5)
WBC: 10.7 10*3/uL — ABNORMAL HIGH (ref 4.0–10.5)

## 2013-01-11 LAB — BASIC METABOLIC PANEL
BUN: 21 mg/dL (ref 6–23)
Calcium: 8.7 mg/dL (ref 8.4–10.5)
Chloride: 104 mEq/L (ref 96–112)
GFR calc Af Amer: 88 mL/min — ABNORMAL LOW (ref >90)
Potassium: 4.1 mEq/L (ref 3.5–5.1)

## 2013-01-11 MED ORDER — VANCOMYCIN HCL IN DEXTROSE 1-5 GM/200ML-% IV SOLN
1000.0000 mg | Freq: Once | INTRAVENOUS | Status: AC
  Administered 2013-01-11: 1000 mg via INTRAVENOUS
  Filled 2013-01-11: qty 200

## 2013-01-11 MED ORDER — POVIDONE-IODINE 10 % EX SOLN
CUTANEOUS | Status: AC
  Filled 2013-01-11: qty 118

## 2013-01-11 MED ORDER — CEPHALEXIN 500 MG PO CAPS: 500.0000 mg | ORAL_CAPSULE | Freq: Four times a day (QID) | ORAL | Status: DC

## 2013-01-11 MED ORDER — MORPHINE SULFATE 4 MG/ML IJ SOLN
4.0000 mg | Freq: Once | INTRAMUSCULAR | Status: AC
Start: 2013-01-11 — End: 2013-01-11
  Administered 2013-01-11: 4 mg via INTRAVENOUS
  Filled 2013-01-11: qty 1

## 2013-01-11 MED ORDER — ONDANSETRON 8 MG PO TBDP
8.0000 mg | ORAL_TABLET | Freq: Once | ORAL | Status: AC
  Administered 2013-01-11: 8 mg via ORAL
  Filled 2013-01-11: qty 1

## 2013-01-11 MED ORDER — OXYCODONE-ACETAMINOPHEN 5-325 MG PO TABS: 1.0000 | ORAL_TABLET | ORAL | Status: DC | PRN

## 2013-01-11 MED ORDER — LIDOCAINE-EPINEPHRINE (PF) 1 %-1:200000 IJ SOLN
INTRAMUSCULAR | Status: AC
  Filled 2013-01-11: qty 10

## 2013-01-11 NOTE — ED Provider Notes (Signed)
Medical screening examination/treatment/procedure(s) were conducted as a shared visit with non-physician practitioner(s) and myself.  I personally evaluated the patient during the encounter  Abscess and cellulitis to left chest wall after apparent insect bite. Worsening erythema from demarcation drawn yesterday. Fluid collection seen at bedside ultrasound. I&D performed by PAC Idol. IV antibiotics given will need wound check in 48 hours.  Glynn Octave, MD 01/11/13 [phone]

## 2013-01-11 NOTE — ED Notes (Signed)
Patient with no complaints at this time. Respirations even and unlabored. Skin warm/dry. Discharge instructions reviewed with patient at this time. Patient given opportunity to voice concerns/ask questions. IV removed per policy and band-aid applied to site. Patient discharged at this time and left Emergency Department with steady gait.  

## 2013-01-11 NOTE — ED Provider Notes (Signed)
Medical screening examination/treatment/procedure(s) were performed by non-physician practitioner and as supervising physician I was immediately available for consultation/collaboration.  Diarra Ceja, MD 01/11/13 1536 

## 2013-01-11 NOTE — ED Notes (Signed)
Pt here for wound recheck to left upper rib cage area.

## 2013-01-11 NOTE — ED Provider Notes (Signed)
CSN: [id number]     Arrival date & time 01/11/13  1450 History   First MD Initiated Contact with Patient 01/11/13 1523     Chief Complaint  Patient presents with  . Wound Check   (Consider location/radiation/quality/duration/timing/severity/associated sxs/prior Treatment) HPI Comments: Annette Galvan is a 52 y.o. Female returning for a recheck of the infected insect bite she was seen here for yesterday.  She has had 4 doses of her double strength bactrim yet has continued and worsened pain,  Spreading redness and swelling since yesterday.  She denies fevers or chills, no nausea or vomiting.  Her pain is exquisite when simply rubbing her arm across the site.  She has taken hydrocodone without relief of pain.  There has been no drainage from the wound site, the area of redness has spread beyond the marker which was applied yesterday.     The history is provided by the patient.    Past Medical History  Diagnosis Date  . Bipolar 1 disorder   . Back pain, chronic   . Leg pain   . HSV-2 (herpes simplex virus 2) infection 2013  . Bilateral chronic knee pain   . Fibromyalgia   . Hx of trichomoniasis   . Arthritis     needs 2 knee replacements  . Bulging disc   . UTI (lower urinary tract infection) 10/12/2012  . GERD (gastroesophageal reflux disease)   . Vaginal odor 12/28/2012    Had discharge +clue   Past Surgical History  Procedure Laterality Date  . Abdominal hysterectomy    . Breast enhancement surgery    . Tubal ligation     Family History  Problem Relation Age of Onset  . Hypertension Mother   . Diabetes Mother   . Depression Mother   . Hyperlipidemia Mother   . Fibromyalgia Mother   . Cancer Father     bone  . Depression Father   . Hyperlipidemia Father   . Hypertension Father   . Depression Sister   . Hyperlipidemia Sister   . Fibromyalgia Sister   . Fibromyalgia Sister   . Colon cancer Neg Hx    History  Substance Use Topics  . Smoking status: Current Every  Day Smoker -- 0.30 packs/day    Types: Cigarettes  . Smokeless tobacco: Never Used  . Alcohol Use: Yes     Comment: beer occasionally   OB History   Grav Para Term Preterm Abortions TAB SAB Ect Mult Living   2 2        2      Review of Systems  Constitutional: Negative for fever and chills.  HENT: Negative for facial swelling.   Respiratory: Negative for shortness of breath and wheezing.   Skin: Positive for color change and wound.  Neurological: Negative for numbness.    Allergies  Latex and Ampicillin  Home Medications   Current Outpatient Rx  Name  Route  Sig  Dispense  Refill  . ALPRAZolam (XANAX) 1 MG tablet   Oral   Take 1 mg by mouth 4 (four) times daily as needed for anxiety.          . cetirizine (ZYRTEC) 10 MG tablet   Oral   Take 1 tablet (10 mg total) by mouth daily.   30 tablet   2   . gabapentin (NEURONTIN) 400 MG capsule   Oral   Take 400 mg by mouth 3 (three) times daily.          Marland Kitchen  HYDROcodone-acetaminophen (NORCO/VICODIN) 5-325 MG per tablet   Oral   Take 1 tablet by mouth every 4 (four) hours as needed for pain.   15 tablet   0   . lamoTRIgine (LAMICTAL) 100 MG tablet   Oral   Take 100 mg by mouth 2 (two) times daily.         . metroNIDAZOLE (FLAGYL) 500 MG tablet   Oral   Take 1 tablet (500 mg total) by mouth 2 (two) times daily.   14 tablet   0   . omeprazole (PRILOSEC) 20 MG capsule   Oral   Take 1 capsule (20 mg total) by mouth every morning.   30 capsule   6   . pregabalin (LYRICA) 75 MG capsule   Oral   Take 1 capsule (75 mg total) by mouth 2 (two) times daily.   60 capsule   3     Tapering off the gabapentin   . sulfamethoxazole-trimethoprim (SEPTRA DS) 800-160 MG per tablet   Oral   Take 2 tablets by mouth 2 (two) times daily.   28 tablet   0   . traZODone (DESYREL) 150 MG tablet   Oral   Take 300 mg by mouth at bedtime.         . VOLTAREN 1 % GEL   Topical   Apply 4 g topically 2 (two) times daily as  needed (Pain).           BP 122/85  Pulse 99  Temp(Src) 97.8 F (36.6 C) (Oral)  Resp 20  Ht 5\' 5"  (1.651 m)  Wt 268 lb (121.564 kg)  BMI 44.6 kg/m2  SpO2 99% Physical Exam  Constitutional: She appears well-developed and well-nourished. No distress.  HENT:  Head: Normocephalic.  Neck: Neck supple.  Cardiovascular: Normal rate.   Pulmonary/Chest: Effort normal. She has no wheezes.  Musculoskeletal: Normal range of motion. She exhibits no edema.  Skin: No rash noted. There is erythema.  Area of induration left lateral chest wall now measures 12 cm x 4 cm with erythema of same dimension. Ttp,  No fluctuance.  Small scab at center of wound. No lymphangitis.    ED Course  Procedures (including critical care time)  Informal bedside US performed.  Small abscess noted right at central scab.   INCISION AND DRAINAGE Performed by: Burgess Amor Consent: Verbal consent obtained. Risks and benefits: risks, benefits and alternatives were discussed Type: abscess  Body area: left flank/chest wall  Anesthesia: local infiltration  Incision was made with a scalpel.  Local anesthetic: lidocaine1% with epinephrine  Anesthetic total: 2 ml injected in skin,  Remaining 3 ml used to flush the open abscess site  Complexity: complex Blunt dissection to break up loculations  Drainage: purulent  Drainage amount: moderate  Packing material: 1/4 in iodoform gauze  Patient tolerance: Patient tolerated the procedure well with no immediate complications.    Labs Review Labs Reviewed  CBC WITH DIFFERENTIAL - Abnormal; Notable for the following:    WBC 10.7 (*)    Neutro Abs 7.8 (*)    Monocytes Absolute 1.1 (*)    All other components within normal limits  BASIC METABOLIC PANEL - Abnormal; Notable for the following:    GFR calc non Af Amer 76 (*)    GFR calc Af Amer 88 (*)    All other components within normal limits   Imaging Review No results found.  MDM   1. Abscess and  cellulitis    Pt was  seen by Dr. Manus Gunning during this ed visit.  She was given vancomycin 1 gram IV.  Added keflex, advised to continue bactrim,  Prescribed percocet for stronger pain relief.  Advised recheck here in 2 days for packing removal and wound recheck.    Burgess Amor, PA-C 01/11/13 2230

## 2013-01-14 ENCOUNTER — Emergency Department (HOSPITAL_COMMUNITY)
Admission: EM | Admit: 2013-01-14 | Discharge: 2013-01-14 | Disposition: A | Discharge status: Home / Self Care | Payer: PRIVATE HEALTH INSURANCE | Attending: Emergency Medicine | Admitting: Emergency Medicine

## 2013-01-14 ENCOUNTER — Encounter (HOSPITAL_COMMUNITY): Payer: Self-pay

## 2013-01-14 DIAGNOSIS — IMO0001 Reserved for inherently not codable concepts without codable children: Secondary | ICD-10-CM | POA: Insufficient documentation

## 2013-01-14 DIAGNOSIS — R51 Headache: Secondary | ICD-10-CM | POA: Insufficient documentation

## 2013-01-14 DIAGNOSIS — F17200 Nicotine dependence, unspecified, uncomplicated: Secondary | ICD-10-CM | POA: Insufficient documentation

## 2013-01-14 DIAGNOSIS — L02219 Cutaneous abscess of trunk, unspecified: Secondary | ICD-10-CM | POA: Insufficient documentation

## 2013-01-14 DIAGNOSIS — R112 Nausea with vomiting, unspecified: Secondary | ICD-10-CM | POA: Insufficient documentation

## 2013-01-14 DIAGNOSIS — Z79899 Other long term (current) drug therapy: Secondary | ICD-10-CM | POA: Insufficient documentation

## 2013-01-14 DIAGNOSIS — Z8619 Personal history of other infectious and parasitic diseases: Secondary | ICD-10-CM | POA: Insufficient documentation

## 2013-01-14 DIAGNOSIS — F319 Bipolar disorder, unspecified: Secondary | ICD-10-CM | POA: Insufficient documentation

## 2013-01-14 DIAGNOSIS — Z87440 Personal history of urinary (tract) infections: Secondary | ICD-10-CM | POA: Insufficient documentation

## 2013-01-14 DIAGNOSIS — L0291 Cutaneous abscess, unspecified: Secondary | ICD-10-CM

## 2013-01-14 DIAGNOSIS — K219 Gastro-esophageal reflux disease without esophagitis: Secondary | ICD-10-CM | POA: Insufficient documentation

## 2013-01-14 DIAGNOSIS — G8929 Other chronic pain: Secondary | ICD-10-CM | POA: Insufficient documentation

## 2013-01-14 DIAGNOSIS — Z91040 Latex allergy status: Secondary | ICD-10-CM | POA: Insufficient documentation

## 2013-01-14 DIAGNOSIS — M129 Arthropathy, unspecified: Secondary | ICD-10-CM | POA: Insufficient documentation

## 2013-01-14 DIAGNOSIS — Z792 Long term (current) use of antibiotics: Secondary | ICD-10-CM | POA: Insufficient documentation

## 2013-01-14 MED ORDER — DOXYCYCLINE HYCLATE 100 MG PO CAPS: 100.0000 mg | ORAL_CAPSULE | Freq: Two times a day (BID) | ORAL | Status: DC

## 2013-01-14 NOTE — ED Provider Notes (Signed)
CSN: [id number]     Arrival date & time 01/14/13  0901 History  This chart was scribed for Shelda Jakes, MD by Bennett Scrape, ED Scribe. This patient was seen in room APA09/APA09 and the patient's care was started at 12:47 PM.   [id number] Check   Patient is a 52 y.o. female presenting with wound check. The history is provided by the patient. No language interpreter was used.  Wound Check This is a new problem. The current episode started more than 2 days ago. The problem occurs constantly. The problem has been gradually improving. Associated symptoms include headaches. Pertinent negatives include no abdominal pain. Treatments tried: gauze.    HPI Comments: Annette Galvan is a 52 y.o. female who presents to the Emergency Department for a wound check of an incision and drainage of an abscess to the left chest wall that was done 3 days ago. She reports that she has kept gauze on the area and is unsure if the area has been draining due to placement of the abscess. She has not changed the guaze since her last visit. She was prescribed Bactrim, Hydrocodone and Percocet. Pt states that she misplaced the 7 remaining pills from her percocet prescription. She states that the hydrocodone and Bactrim did not improve her symptoms, so she tossed the rest of the medications. She denies being on any antibiotics currently. She also reports nausea, emesis and HAs that started yesterday. She states that the HA started last night and she denies taking any medications for her symptoms stating that she "couldn't find my pain medicine".    Past Medical History  Diagnosis Date  . Bipolar 1 disorder   . Back pain, chronic   . Leg pain   . HSV-2 (herpes simplex virus 2) infection 2013  . Bilateral chronic knee pain   . Fibromyalgia   . Hx of trichomoniasis   . Arthritis     needs 2 knee replacements  . Bulging disc   . UTI (lower urinary tract infection) 10/12/2012  . GERD (gastroesophageal reflux disease)    . Vaginal odor 12/28/2012    Had discharge +clue   Past Surgical History  Procedure Laterality Date  . Abdominal hysterectomy    . Breast enhancement surgery    . Tubal ligation     Family History  Problem Relation Age of Onset  . Hypertension Mother   . Diabetes Mother   . Depression Mother   . Hyperlipidemia Mother   . Fibromyalgia Mother   . Cancer Father     bone  . Depression Father   . Hyperlipidemia Father   . Hypertension Father   . Depression Sister   . Hyperlipidemia Sister   . Fibromyalgia Sister   . Fibromyalgia Sister   . Colon cancer Neg Hx    History  Substance Use Topics  . Smoking status: Current Every Day Smoker -- 0.30 packs/day    Types: Cigarettes  . Smokeless tobacco: Never Used  . Alcohol Use: Yes     Comment: beer occasionally   OB History   Grav Para Term Preterm Abortions TAB SAB Ect Mult Living   2 2        2      Review of Systems  Constitutional: Negative for fever.  Gastrointestinal: Positive for nausea and vomiting. Negative for abdominal pain.  Skin: Positive for color change and wound.  Neurological: Positive for headaches.  All other systems reviewed and are negative.    Allergies  Latex  and Ampicillin  Home Medications   Current Outpatient Rx  Name  Route  Sig  Dispense  Refill  . ALPRAZolam (XANAX) 1 MG tablet   Oral   Take 1 mg by mouth 4 (four) times daily as needed for anxiety.          . cephALEXin (KEFLEX) 500 MG capsule   Oral   Take 1 capsule (500 mg total) by mouth 4 (four) times daily.   28 capsule   0   . cetirizine (ZYRTEC) 10 MG tablet   Oral   Take 1 tablet (10 mg total) by mouth daily.   30 tablet   2   . gabapentin (NEURONTIN) 400 MG capsule   Oral   Take 400 mg by mouth 3 (three) times daily.          Marland Kitchen HYDROcodone-acetaminophen (NORCO/VICODIN) 5-325 MG per tablet   Oral   Take 1 tablet by mouth every 4 (four) hours as needed for pain.   15 tablet   0   . lamoTRIgine (LAMICTAL)  100 MG tablet   Oral   Take 100 mg by mouth 2 (two) times daily.         Marland Kitchen omeprazole (PRILOSEC) 20 MG capsule   Oral   Take 1 capsule (20 mg total) by mouth every morning.   30 capsule   6   . oxyCODONE-acetaminophen (PERCOCET/ROXICET) 5-325 MG per tablet   Oral   Take 1 tablet by mouth every 4 (four) hours as needed for pain.   20 tablet   0   . pregabalin (LYRICA) 75 MG capsule   Oral   Take 1 capsule (75 mg total) by mouth 2 (two) times daily.   60 capsule   3     Tapering off the gabapentin   . traZODone (DESYREL) 150 MG tablet   Oral   Take 300 mg by mouth at bedtime.         . VOLTAREN 1 % GEL   Topical   Apply 4 g topically 2 (two) times daily as needed (Pain).          Marland Kitchen doxycycline (VIBRAMYCIN) 100 MG capsule   Oral   Take 1 capsule (100 mg total) by mouth 2 (two) times daily.   14 capsule   0    Triage Vitals: BP 144/81  Pulse 76  Temp(Src) 98.3 F (36.8 C) (Oral)  Resp 16  SpO2 100%  Physical Exam  Nursing note and vitals reviewed. Constitutional: She is oriented to person, place, and time. She appears well-developed and well-nourished. No distress.  HENT:  Head: Normocephalic and atraumatic.  Mouth/Throat: Oropharynx is clear and moist.  Erythematous areas on the top of the soft palate   Eyes: Conjunctivae and EOM are normal. Pupils are equal, round, and reactive to light.  Sclera are clear  Neck: Neck supple. No tracheal deviation present.  Cardiovascular: Normal rate and regular rhythm.   No murmur heard. Pulses:      Dorsalis pedis pulses are 2+ on the right side, and 2+ on the left side.  Pulmonary/Chest: Effort normal and breath sounds normal. No respiratory distress. She has no wheezes.  Abdominal: Soft. Bowel sounds are normal. She exhibits no distension. There is no tenderness.  Musculoskeletal: Normal range of motion. She exhibits no edema (no ankle swelling).  Lymphadenopathy:    She has no cervical adenopathy.   Neurological: She is alert and oriented to person, place, and time. No cranial nerve deficit.  Pt able to move both sets of fingers and toes  Skin: Skin is warm and dry. No rash noted.  Purulent site from the drainage site  Psychiatric: She has a normal mood and affect. Her behavior is normal.    ED Course  Procedures (including critical care time)  DIAGNOSTIC STUDIES: Oxygen Saturation is 100% on room air, normal by my interpretation.    COORDINATION OF CARE: 12:51 PM-Discussed discharge plan which includes at home treatment care of the incision site with pt at bedside and pt agreed to plan. Advised pt to change the gauze once a day.  Labs Review Labs Reviewed - No data to display Imaging Review No results found.  MDM   1. Abscess    Patient seen September 25 and seen September 26 with an I&D of an abscess. Patient currently on Keflex. Wound is still draining pus. Not completely healed. Will switch from Keflex to doxycycline. Patient will continue Motrin 800 mg every 8 hours. Wound check again in 2 days.   I personally performed the services described in this documentation, which was scribed in my presence. The recorded information has been reviewed and is accurate.    Shelda Jakes, MD 01/14/13 1328

## 2013-01-14 NOTE — ED Notes (Signed)
Pt reports getting an insect bite to her left side lanced on Thursday and is here today for a re-check.  Pt reports severe pain to the area and some n/v.  Pt also reports blisters to the roof of her mouth.  Pt denies any fever.

## 2013-01-24 ENCOUNTER — Emergency Department (HOSPITAL_COMMUNITY)
Admission: EM | Admit: 2013-01-24 | Discharge: 2013-01-24 | Disposition: A | Discharge status: Home / Self Care | Payer: PRIVATE HEALTH INSURANCE | Attending: Emergency Medicine | Admitting: Emergency Medicine

## 2013-01-24 ENCOUNTER — Encounter (HOSPITAL_COMMUNITY): Payer: Self-pay | Admitting: Emergency Medicine

## 2013-01-24 DIAGNOSIS — W010XXA Fall on same level from slipping, tripping and stumbling without subsequent striking against object, initial encounter: Secondary | ICD-10-CM | POA: Insufficient documentation

## 2013-01-24 DIAGNOSIS — S8990XA Unspecified injury of unspecified lower leg, initial encounter: Secondary | ICD-10-CM | POA: Insufficient documentation

## 2013-01-24 DIAGNOSIS — Z91040 Latex allergy status: Secondary | ICD-10-CM | POA: Insufficient documentation

## 2013-01-24 DIAGNOSIS — Y9289 Other specified places as the place of occurrence of the external cause: Secondary | ICD-10-CM | POA: Insufficient documentation

## 2013-01-24 DIAGNOSIS — Z5189 Encounter for other specified aftercare: Secondary | ICD-10-CM

## 2013-01-24 DIAGNOSIS — Z880 Allergy status to penicillin: Secondary | ICD-10-CM | POA: Insufficient documentation

## 2013-01-24 DIAGNOSIS — Z792 Long term (current) use of antibiotics: Secondary | ICD-10-CM | POA: Insufficient documentation

## 2013-01-24 DIAGNOSIS — Z87440 Personal history of urinary (tract) infections: Secondary | ICD-10-CM | POA: Insufficient documentation

## 2013-01-24 DIAGNOSIS — Z4801 Encounter for change or removal of surgical wound dressing: Secondary | ICD-10-CM | POA: Insufficient documentation

## 2013-01-24 DIAGNOSIS — S5002XA Contusion of left elbow, initial encounter: Secondary | ICD-10-CM

## 2013-01-24 DIAGNOSIS — Z79899 Other long term (current) drug therapy: Secondary | ICD-10-CM | POA: Insufficient documentation

## 2013-01-24 DIAGNOSIS — K219 Gastro-esophageal reflux disease without esophagitis: Secondary | ICD-10-CM | POA: Insufficient documentation

## 2013-01-24 DIAGNOSIS — M25561 Pain in right knee: Secondary | ICD-10-CM

## 2013-01-24 DIAGNOSIS — Y9301 Activity, walking, marching and hiking: Secondary | ICD-10-CM | POA: Insufficient documentation

## 2013-01-24 DIAGNOSIS — Z8742 Personal history of other diseases of the female genital tract: Secondary | ICD-10-CM | POA: Insufficient documentation

## 2013-01-24 DIAGNOSIS — F17200 Nicotine dependence, unspecified, uncomplicated: Secondary | ICD-10-CM | POA: Insufficient documentation

## 2013-01-24 DIAGNOSIS — F319 Bipolar disorder, unspecified: Secondary | ICD-10-CM | POA: Insufficient documentation

## 2013-01-24 DIAGNOSIS — Z8619 Personal history of other infectious and parasitic diseases: Secondary | ICD-10-CM | POA: Insufficient documentation

## 2013-01-24 DIAGNOSIS — IMO0001 Reserved for inherently not codable concepts without codable children: Secondary | ICD-10-CM | POA: Insufficient documentation

## 2013-01-24 DIAGNOSIS — G8929 Other chronic pain: Secondary | ICD-10-CM | POA: Insufficient documentation

## 2013-01-24 DIAGNOSIS — S5000XA Contusion of unspecified elbow, initial encounter: Secondary | ICD-10-CM | POA: Insufficient documentation

## 2013-01-24 DIAGNOSIS — M129 Arthropathy, unspecified: Secondary | ICD-10-CM | POA: Insufficient documentation

## 2013-01-24 MED ORDER — TRAMADOL HCL 50 MG PO TABS: 50.0000 mg | ORAL_TABLET | Freq: Four times a day (QID) | ORAL | Status: DC | PRN

## 2013-01-24 NOTE — ED Provider Notes (Signed)
CSN: [id number]     Arrival date & time 01/24/13  1318 History   First MD Initiated Contact with Patient 01/24/13 1327     Chief Complaint  Patient presents with  . Fall   (Consider location/radiation/quality/duration/timing/severity/associated sxs/prior Treatment) HPI Comments: Annette Galvan is a 52 y.o. Female presenting for evaluation of contusion to her right knee and her left elbow after slipping in a puddle of water, landing on the right knee and hitting her left elbow against the microwave 2 hours before arrival.  She has been ambulatory since the event.  She denies hitting her head and denies loc, numbness or weakness since the event.  Pain is worse with palpation, but she can fully flex and extend her joints without pain.  Secondly mentions having a persistent knot at the site of the abscess she had drained here on 9/26.  Her pain , redness and swelling are greatly improved and she has completed her antibiotic.      The history is provided by the patient.    Past Medical History  Diagnosis Date  . Bipolar 1 disorder   . Back pain, chronic   . Leg pain   . HSV-2 (herpes simplex virus 2) infection 2013  . Bilateral chronic knee pain   . Fibromyalgia   . Hx of trichomoniasis   . Arthritis     needs 2 knee replacements  . Bulging disc   . UTI (lower urinary tract infection) 10/12/2012  . GERD (gastroesophageal reflux disease)   . Vaginal odor 12/28/2012    Had discharge +clue   Past Surgical History  Procedure Laterality Date  . Abdominal hysterectomy    . Breast enhancement surgery    . Tubal ligation     Family History  Problem Relation Age of Onset  . Hypertension Mother   . Diabetes Mother   . Depression Mother   . Hyperlipidemia Mother   . Fibromyalgia Mother   . Cancer Father     bone  . Depression Father   . Hyperlipidemia Father   . Hypertension Father   . Depression Sister   . Hyperlipidemia Sister   . Fibromyalgia Sister   . Fibromyalgia Sister    . Colon cancer Neg Hx    History  Substance Use Topics  . Smoking status: Current Every Day Smoker -- 0.30 packs/day    Types: Cigarettes  . Smokeless tobacco: Never Used  . Alcohol Use: Yes     Comment: beer occasionally   OB History   Grav Para Term Preterm Abortions TAB SAB Ect Mult Living   2 2        2      Review of Systems  Constitutional: Negative for fever.  Musculoskeletal: Positive for arthralgias. Negative for joint swelling and myalgias.  Skin: Positive for color change.  Neurological: Negative for weakness and numbness.    Allergies  Latex and Ampicillin  Home Medications   Current Outpatient Rx  Name  Route  Sig  Dispense  Refill  . ALPRAZolam (XANAX) 1 MG tablet   Oral   Take 1 mg by mouth 4 (four) times daily as needed for anxiety.          . cephALEXin (KEFLEX) 500 MG capsule   Oral   Take 1 capsule (500 mg total) by mouth 4 (four) times daily.   28 capsule   0   . cetirizine (ZYRTEC) 10 MG tablet   Oral   Take 1 tablet (10 mg total)  by mouth daily.   30 tablet   2   . doxycycline (VIBRAMYCIN) 100 MG capsule   Oral   Take 1 capsule (100 mg total) by mouth 2 (two) times daily.   14 capsule   0   . gabapentin (NEURONTIN) 400 MG capsule   Oral   Take 400 mg by mouth 3 (three) times daily.          Marland Kitchen HYDROcodone-acetaminophen (NORCO/VICODIN) 5-325 MG per tablet   Oral   Take 1 tablet by mouth every 4 (four) hours as needed for pain.   15 tablet   0   . lamoTRIgine (LAMICTAL) 100 MG tablet   Oral   Take 100 mg by mouth 2 (two) times daily.         Marland Kitchen omeprazole (PRILOSEC) 20 MG capsule   Oral   Take 1 capsule (20 mg total) by mouth every morning.   30 capsule   6   . oxyCODONE-acetaminophen (PERCOCET/ROXICET) 5-325 MG per tablet   Oral   Take 1 tablet by mouth every 4 (four) hours as needed for pain.   20 tablet   0   . traZODone (DESYREL) 150 MG tablet   Oral   Take 300 mg by mouth at bedtime.         . VOLTAREN 1  % GEL   Topical   Apply 4 g topically 4 (four) times daily.          . traMADol (ULTRAM) 50 MG tablet   Oral   Take 1 tablet (50 mg total) by mouth every 6 (six) hours as needed for pain.   15 tablet   0    BP 121/67  Pulse 80  Temp(Src) 98.6 F (37 C) (Oral)  Resp 20  Ht 5\' 4"  (1.626 m)  Wt 267 lb (121.11 kg)  BMI 45.81 kg/m2  SpO2 100% Physical Exam  Constitutional: She appears well-developed and well-nourished.  HENT:  Head: Atraumatic.  Neck: Normal range of motion.  Cardiovascular:  Pulses equal bilaterally  Pulmonary/Chest:  Well healed abscess site left lateral chest wall/axilla. Small linear induration along the site beneath her left lateral breast border,  Subcutaneous.  Moveable,  No fluctuance.  No erythema.   Musculoskeletal: She exhibits tenderness.       Right knee: She exhibits no effusion, no ecchymosis, no erythema, normal alignment, no LCL laxity, normal patellar mobility and no MCL laxity.  ttp patella,  No palpable deformity.  Pt displays FROM with mild discomfort.  Left elbow without edema,  Has FROM, she does have small early ecchymosis without hematoma left distal posterior humerus,  tender to palpation.  Displays FROM with both pronation and supination of left forearm.  Neurological: She is alert. She has normal strength. She displays normal reflexes. No sensory deficit.  Equal strength  Skin: Skin is warm and dry.  Psychiatric: She has a normal mood and affect.    ED Course  Procedures (including critical care time) Labs Review Labs Reviewed - No data to display Imaging Review No results found.  EKG Interpretation   None       MDM   1. Elbow contusion, left, initial encounter   2. Knee pain, acute, right   3. Wound check, abscess    Exam c/w contusions,  Pt has FROM of injured extremities,  No indiction for xrays today which she agrees with.  She was encouraged warm compresses to her abscess site and recheck by gyn if the swollen  area left chest wall not improved over the next 2 weeks.  Suspect swelling is from induration from recent infection,  However she is overdue for her mammogram, this induration is near her inferior left mammary ridge, mobile,  Not a fixed nodule,  But discussed need for f/u if this does not improve with warm compresses.  Pt understands.  She was encouraged ice packs,  Rest of knee, elbow contusions,  Tramadol prescribed.  PRn f/u.    Burgess Amor, PA-C 01/24/13 1706

## 2013-01-24 NOTE — ED Notes (Signed)
Pt ambulated to bathroom with steady gait. No assistance needed.

## 2013-01-24 NOTE — ED Notes (Signed)
Fell app 1 hour ago, pain rt knee , lt elbow.   Slipped on wet floor.  Recheck of insect bite to lt chest.

## 2013-01-24 NOTE — ED Notes (Signed)
Pt returns to ED after a fall this morning occuring while walking on a wet floor. Pt reports injured her left elbow and knee. Redness noted to both, no swelling, bruising or deformity noted.  Pt also has a 2 week post procedure of an I&D to left chest wound that appears to be healing , no drainage, or infection noted, however pt would like said wound checked. Pt denies fever, N/V at this time.

## 2013-01-25 ENCOUNTER — Other Ambulatory Visit: Payer: Self-pay | Admitting: Family Medicine

## 2013-01-29 ENCOUNTER — Other Ambulatory Visit: Payer: Self-pay | Admitting: Family Medicine

## 2013-01-29 NOTE — Telephone Encounter (Signed)
Okay to refill, give 3 refills 

## 2013-01-29 NOTE — Telephone Encounter (Signed)
?  ok to refill, did not see on her medication list

## 2013-01-30 NOTE — ED Provider Notes (Signed)
Medical screening examination/treatment/procedure(s) were performed by non-physician practitioner and as supervising physician I was immediately available for consultation/collaboration.   Bryttney Netzer J Arsal Tappan, MD 01/30/13 1641 

## 2013-01-31 ENCOUNTER — Other Ambulatory Visit: Payer: Self-pay | Admitting: Family Medicine

## 2013-01-31 NOTE — Telephone Encounter (Signed)
I would call to see if she is using this patch Note states she last filled in August This is a 12 week program, she should be down to a 14mg  or 7mg  patch

## 2013-01-31 NOTE — Telephone Encounter (Signed)
Ok refill? 

## 2013-02-04 ENCOUNTER — Ambulatory Visit (INDEPENDENT_AMBULATORY_CARE_PROVIDER_SITE_OTHER): Payer: PRIVATE HEALTH INSURANCE | Admitting: Adult Health

## 2013-02-04 ENCOUNTER — Encounter: Payer: Self-pay | Admitting: Adult Health

## 2013-02-04 ENCOUNTER — Encounter (INDEPENDENT_AMBULATORY_CARE_PROVIDER_SITE_OTHER): Payer: Self-pay

## 2013-02-04 VITALS — BP 130/90 | Ht 65.0 in | Wt 265.0 lb

## 2013-02-04 DIAGNOSIS — B373 Candidiasis of vulva and vagina: Secondary | ICD-10-CM

## 2013-02-04 DIAGNOSIS — B379 Candidiasis, unspecified: Secondary | ICD-10-CM

## 2013-02-04 HISTORY — DX: Candidiasis, unspecified: B37.9

## 2013-02-04 LAB — POCT WET PREP (WET MOUNT)

## 2013-02-04 MED ORDER — FLUCONAZOLE 150 MG PO TABS: ORAL_TABLET | ORAL | Status: DC

## 2013-02-04 NOTE — Patient Instructions (Signed)
Monilial Vaginitis Vaginitis in a soreness, swelling and redness (inflammation) of the vagina and vulva. Monilial vaginitis is not a sexually transmitted infection. CAUSES  Yeast vaginitis is caused by yeast (candida) that is normally found in your vagina. With a yeast infection, the candida has overgrown in number to a point that upsets the chemical balance. SYMPTOMS   White, thick vaginal discharge.  Swelling, itching, redness and irritation of the vagina and possibly the lips of the vagina (vulva).  Burning or painful urination.  Painful intercourse. DIAGNOSIS  Things that may contribute to monilial vaginitis are:  Postmenopausal and virginal states.  Pregnancy.  Infections.  Being tired, sick or stressed, especially if you had monilial vaginitis in the past.  Diabetes. Good control will help lower the chance.  Birth control pills.  Tight fitting garments.  Using bubble bath, feminine sprays, douches or deodorant tampons.  Taking certain medications that kill germs (antibiotics).  Sporadic recurrence can occur if you become ill. TREATMENT  Your caregiver will give you medication.  There are several kinds of anti monilial vaginal creams and suppositories specific for monilial vaginitis. For recurrent yeast infections, use a suppository or cream in the vagina 2 times a week, or as directed.  Anti-monilial or steroid cream for the itching or irritation of the vulva may also be used. Get your caregiver's permission.  Painting the vagina with methylene blue solution may help if the monilial cream does not work.  Eating yogurt may help prevent monilial vaginitis. HOME CARE INSTRUCTIONS   Finish all medication as prescribed.  Do not have sex until treatment is completed or after your caregiver tells you it is okay.  Take warm sitz baths.  Do not douche.  Do not use tampons, especially scented ones.  Wear cotton underwear.  Avoid tight pants and panty  hose.  Tell your sexual partner that you have a yeast infection. They should go to their caregiver if they have symptoms such as mild rash or itching.  Your sexual partner should be treated as well if your infection is difficult to eliminate.  Practice safer sex. Use condoms.  Some vaginal medications cause latex condoms to fail. Vaginal medications that harm condoms are:  Cleocin cream.  Butoconazole (Femstat).  Terconazole (Terazol) vaginal suppository.  Miconazole (Monistat) (may be purchased over the counter). SEEK MEDICAL CARE IF:   You have a temperature by mouth above 102 F (38.9 C).  The infection is getting worse after 2 days of treatment.  The infection is not getting better after 3 days of treatment.  You develop blisters in or around your vagina.  You develop vaginal bleeding, and it is not your menstrual period.  You have pain when you urinate.  You develop intestinal problems.  You have pain with sexual intercourse. Document Released: 01/12/2005 Document Revised: 06/27/2011 Document Reviewed: 09/26/2008 [employer] Patient Information 2014 [employer]. Yeast Infection of the Skin Some yeast on the skin is normal, but sometimes it causes an infection. If you have a yeast infection, it shows up as white or light brown patches on brown skin. You can see it better in the summer on tan skin. It causes light-colored holes in your suntan. It can happen on any area of the body. This cannot be passed from person to person. HOME CARE  Scrub your skin daily with a dandruff shampoo. Your rash may take a couple weeks to get well.  Do not scratch or itch the rash. GET HELP RIGHT AWAY IF:   You   get another infection from scratching. The skin may get warm, red, and may ooze fluid.  The infection does not seem to be getting better. MAKE SURE YOU:  Understand these instructions.  Will watch your condition.  Will get help right away if you are not doing well or  get worse. Document Released: 03/17/2008 Document Revised: 06/27/2011 Document Reviewed: 03/17/2008 [facility] Patient Information 2014 [employer], [place]. Take diflucan follow up prn

## 2013-02-04 NOTE — Progress Notes (Signed)
Subjective:     Patient ID: Annette Galvan, female   DOB: 1961/04/17, 52 y.o.   MRN: [id number]  HPI Annette Galvan is a 52 year old white female in for follow up of ER visit for bug bite with abscess left breast, had I&D and antibiotics.Now complains of rash on labia and groin, did change detergent and powders.  Review of Systems See HPI Reviewed past medical,surgical, social and family history. Reviewed medications and allergies.     Objective:   Physical Exam BP 130/90  Ht 5\' 5"  (1.651 m)  Wt 265 lb (120.203 kg)  BMI 44.1 kg/m2   Skin warm and dry.Pelvic: external genitalia is red and scaly around borders in appearance, vagina: white discharge without odor,  Wet prep: +yeast.painted with gentian violet Area on breast healed, no dominant ,mass retraction or nipple discharge noted, no lymph nodes felt. Assessment:     Yeast and skin fungus    Plan:     Rx diflucan 150 mg #2 1 now and 1 in 3 days with 1 refill Follow up prn  Review handout on yeast

## 2013-03-05 ENCOUNTER — Ambulatory Visit (HOSPITAL_COMMUNITY): Payer: PRIVATE HEALTH INSURANCE | Admitting: Psychiatry

## 2013-03-26 ENCOUNTER — Ambulatory Visit (HOSPITAL_COMMUNITY): Payer: PRIVATE HEALTH INSURANCE | Admitting: Psychiatry

## 2013-04-04 ENCOUNTER — Other Ambulatory Visit: Payer: Self-pay | Admitting: Adult Health

## 2013-04-15 ENCOUNTER — Telehealth: Payer: Self-pay | Admitting: Family Medicine

## 2013-04-15 NOTE — Telephone Encounter (Signed)
Pt needs appt first, has not been seen since May

## 2013-04-15 NOTE — Telephone Encounter (Signed)
Request refill Lyrica?  Do not see on med list?

## 2013-04-16 NOTE — Telephone Encounter (Signed)
Pt aware appt needed.  States she will call back and schedule

## 2013-05-03 ENCOUNTER — Encounter (INDEPENDENT_AMBULATORY_CARE_PROVIDER_SITE_OTHER): Payer: Self-pay

## 2013-05-03 ENCOUNTER — Encounter (HOSPITAL_COMMUNITY): Payer: Self-pay | Admitting: Psychiatry

## 2013-05-03 ENCOUNTER — Ambulatory Visit (INDEPENDENT_AMBULATORY_CARE_PROVIDER_SITE_OTHER): Payer: PRIVATE HEALTH INSURANCE | Admitting: Psychiatry

## 2013-05-03 VITALS — BP 140/80 | Ht 65.0 in | Wt 269.0 lb

## 2013-05-03 DIAGNOSIS — F4310 Post-traumatic stress disorder, unspecified: Secondary | ICD-10-CM

## 2013-05-03 MED ORDER — TRAZODONE HCL 150 MG PO TABS: 300.0000 mg | ORAL_TABLET | Freq: Every day | ORAL | Status: DC

## 2013-05-03 MED ORDER — LAMOTRIGINE 100 MG PO TABS: 100.0000 mg | ORAL_TABLET | Freq: Two times a day (BID) | ORAL | Status: DC

## 2013-05-03 MED ORDER — GABAPENTIN 400 MG PO CAPS: 400.0000 mg | ORAL_CAPSULE | Freq: Three times a day (TID) | ORAL | Status: DC

## 2013-05-03 MED ORDER — ALPRAZOLAM 1 MG PO TABS: 1.0000 mg | ORAL_TABLET | Freq: Four times a day (QID) | ORAL | Status: DC | PRN
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
## 2013-05-17 ENCOUNTER — Encounter: Payer: Self-pay | Admitting: Family Medicine

## 2013-05-17 ENCOUNTER — Ambulatory Visit (INDEPENDENT_AMBULATORY_CARE_PROVIDER_SITE_OTHER): Payer: PRIVATE HEALTH INSURANCE | Admitting: Family Medicine

## 2013-05-17 VITALS — BP 140/90 | HR 100 | Temp 97.7°F | Resp 18 | Ht 64.0 in | Wt 273.0 lb

## 2013-05-17 DIAGNOSIS — M1710 Unilateral primary osteoarthritis, unspecified knee: Secondary | ICD-10-CM | POA: Insufficient documentation

## 2013-05-17 DIAGNOSIS — M179 Osteoarthritis of knee, unspecified: Secondary | ICD-10-CM | POA: Insufficient documentation

## 2013-05-17 DIAGNOSIS — J019 Acute sinusitis, unspecified: Secondary | ICD-10-CM

## 2013-05-17 DIAGNOSIS — R7309 Other abnormal glucose: Secondary | ICD-10-CM

## 2013-05-17 DIAGNOSIS — R7303 Prediabetes: Secondary | ICD-10-CM

## 2013-05-17 DIAGNOSIS — IMO0001 Reserved for inherently not codable concepts without codable children: Secondary | ICD-10-CM

## 2013-05-17 DIAGNOSIS — IMO0002 Reserved for concepts with insufficient information to code with codable children: Secondary | ICD-10-CM

## 2013-05-17 DIAGNOSIS — M797 Fibromyalgia: Secondary | ICD-10-CM

## 2013-05-17 LAB — HEMOGLOBIN A1C
Hgb A1c MFr Bld: 5.6 % (ref <5.7)
Mean Plasma Glucose: 114 mg/dL (ref <117)

## 2013-05-17 LAB — LIPID PANEL
CHOL/HDL RATIO: 2.6 ratio
Cholesterol: 170 mg/dL (ref 0–200)
HDL: 65 mg/dL (ref >39)
LDL Cholesterol: 78 mg/dL (ref 0–99)
TRIGLYCERIDES: 135 mg/dL (ref <150)
VLDL: 27 mg/dL (ref 0–40)

## 2013-05-17 LAB — COMPREHENSIVE METABOLIC PANEL
ALT: 14 U/L (ref 0–35)
AST: 15 U/L (ref 0–37)
Albumin: 4.2 g/dL (ref 3.5–5.2)
Alkaline Phosphatase: 72 U/L (ref 39–117)
BILIRUBIN TOTAL: 0.3 mg/dL (ref 0.2–1.2)
BUN: 20 mg/dL (ref 6–23)
CO2: 27 mEq/L (ref 19–32)
CREATININE: 0.73 mg/dL (ref 0.50–1.10)
Calcium: 9.1 mg/dL (ref 8.4–10.5)
Chloride: 99 mEq/L (ref 96–112)
GLUCOSE: 84 mg/dL (ref 70–99)
POTASSIUM: 4.5 meq/L (ref 3.5–5.3)
Sodium: 133 mEq/L — ABNORMAL LOW (ref 135–145)
Total Protein: 7.2 g/dL (ref 6.0–8.3)

## 2013-05-17 MED ORDER — HYDROCODONE-ACETAMINOPHEN 5-325 MG PO TABS: 1.0000 | ORAL_TABLET | Freq: Every day | ORAL | Status: DC

## 2013-05-17 MED ORDER — BELVIQ 10 MG PO TABS: ORAL_TABLET | ORAL | Status: DC

## 2013-05-17 MED ORDER — FLUTICASONE PROPIONATE 50 MCG/ACT NA SUSP: 2.0000 | Freq: Every day | NASAL | Status: DC

## 2013-05-17 MED ORDER — AZITHROMYCIN 250 MG PO TABS: ORAL_TABLET | ORAL | Status: DC

## 2013-05-17 MED ORDER — HYDROCODONE-ACETAMINOPHEN 5-325 MG PO TABS: 1.0000 | ORAL_TABLET | ORAL | Status: DC | PRN

## 2013-05-17 NOTE — Assessment & Plan Note (Signed)
Treat based on duration of symptoms zpak,flonase

## 2013-05-17 NOTE — Progress Notes (Signed)
   Subjective:    Patient ID: Annette Galvan, female    DOB: [birth year], 53 y.o.   MRN: [id number]  HPI  Patient here to followup chronic medical problems. She has multiple concerns today. She has history of bipolar disorder depressed type she is being followed by Dr. Harrington Challenger now with psychiatry and recently had some medication changes. She feels very overwhelmed by her increasing weight and her severe arthritis of her knees. She states that she is so depressed that she sits around and eat because she cannot get up to walk because of pain. She's been followed by orthopedics Dr. Lorre Nick who is given her cortisone injection she will next year round of gel injection she's been told that she needs to lose a least 30 pounds before she is a candidate for knee replacement but she's been unable to do so. She requested pain medication to help her daily as she's been taking NSAIDs with no improvement and Dr. Lorre Nick defer to her primary care provider. She also requests help with weight loss and she is now up to 273 pounds with a BMI greater than 40. She's also concerned about her blood sugars she is a history of borderline diabetes however she has not been seen since last May therefore has not had any recheck on this. She states that she took her blood sugar with her mothers meter and he came back greater than 201st thing in the morning.Marland Kitchen  She's also had sinus drainage with thick creamy case sinus pressure and headache for the past week and a half. She has tried some over-the-counter medications with minimal improvement.  Review of Systems  GEN- denies fatigue, fever, weight loss,weakness, recent illness HEENT- denies eye drainage, change in vision, +nasal discharge, CVS- denies chest pain, palpitations RESP- denies SOB, cough, wheeze ABD- denies N/V, change in stools, abd pain GU- denies dysuria, hematuria, dribbling, incontinence MSK-+ joint pain, muscle aches, injury Neuro- denies headache, dizziness, syncope,  seizure activity      Objective:   Physical Exam GEN- NAD, alert and oriented x3,morbid obese HEENT- PERRL, EOMI, non injected sclera, pink conjunctiva, MMM, oropharynx+ post nasal drip,  TM clear bilat, scarring Right TM no fluid or drainage noted, canal clear bilat, TTP maxillary and frontal sinuses, +nasal discharge Neck- Supple, no LAD CVS- RRR, no murmur RESP-CTAB MSK- TTP multiple trigger spots on back, legs, arms EXT- No edema Pulses- Radial, DP- 2+ Psych- Depressed affect, cries easily, not overly anxious, good eye contact, normal speech       Assessment & Plan:

## 2013-05-17 NOTE — Assessment & Plan Note (Signed)
I'm very concerned about her increasing weight and a this is contributing to worsening arthritis as well as her depression. She is unable to have any intervention on her arthritis do to her weight. I will try her on the Belviq we will watch her closely and she is on psychiatric medications and see how she does she will followup in 4 weeks

## 2013-05-17 NOTE — Assessment & Plan Note (Signed)
She did not do well on the Lyrica states that it made her very drowsy and she did not feel well on this therefore she was returned back to gabapentin 3 times a day by her psychiatrist

## 2013-05-17 NOTE — Assessment & Plan Note (Signed)
I've given her hydrocodone one tablet daily for pain she will continue her NSAIDs she will be given another referral back to her orthopedic surgeon so I can have followup on her. I do not want her on high doses of narcotics do to her depression as well as artery been on Xanax multiple times a day. I understand that she needs some pain relief in order to improve her mobility

## 2013-05-17 NOTE — Patient Instructions (Signed)
Use Flonase for your sinuses Zpak given Start belviq 1 tablet twice a day for weight loss Hydrocodone 1 tablet daily for pain Referral to Dr. Lorre Nick We will send letter if labs are normal F/u 4 weeks

## 2013-05-17 NOTE — Assessment & Plan Note (Signed)
I will check an A1c as well as metabolic panel and start appropriate medications

## 2013-05-20 ENCOUNTER — Telehealth: Payer: Self-pay | Admitting: *Deleted

## 2013-05-20 MED ORDER — CETIRIZINE HCL 10 MG PO TABS: 10.0000 mg | ORAL_TABLET | Freq: Every day | ORAL | Status: DC

## 2013-05-20 MED ORDER — IBUPROFEN 800 MG PO TABS: ORAL_TABLET | ORAL | Status: DC

## 2013-05-20 NOTE — Telephone Encounter (Signed)
Okay to refill? 

## 2013-05-20 NOTE — Telephone Encounter (Signed)
?   Ok to refill ibuprofen 800mg

## 2013-05-20 NOTE — Telephone Encounter (Signed)
Meds refilled.

## 2013-05-28 ENCOUNTER — Telehealth: Payer: Self-pay | Admitting: Family Medicine

## 2013-05-28 NOTE — Telephone Encounter (Signed)
Denied, she only needs 1 course, azithromyin stays in system for 10 days

## 2013-05-28 NOTE — Telephone Encounter (Signed)
Returned to pharmacy denied

## 2013-05-28 NOTE — Telephone Encounter (Signed)
Pharmacy requesting refill on Azithromycin???

## 2013-06-06 ENCOUNTER — Telehealth: Payer: Self-pay | Admitting: *Deleted

## 2013-06-06 NOTE — Telephone Encounter (Signed)
i have called this pt 3xtoday and each time was wrong number

## 2013-06-06 NOTE — Telephone Encounter (Signed)
Message copied by Maureen Chatters on Thu Jun 06, 2013  4:23 PM ------      Message from: Lenore Manner      Created: Thu Jun 06, 2013 10:49 AM      Regarding: Speak to you asap      Contact: [phone]       Pt is really wanting to speak to you asap  ------

## 2013-06-14 ENCOUNTER — Ambulatory Visit: Payer: PRIVATE HEALTH INSURANCE | Admitting: Family Medicine

## 2013-06-17 ENCOUNTER — Ambulatory Visit (HOSPITAL_COMMUNITY): Payer: PRIVATE HEALTH INSURANCE | Admitting: Psychiatry

## 2013-06-19 ENCOUNTER — Encounter (HOSPITAL_COMMUNITY): Payer: Self-pay | Admitting: Psychiatry

## 2013-06-19 ENCOUNTER — Ambulatory Visit (INDEPENDENT_AMBULATORY_CARE_PROVIDER_SITE_OTHER): Payer: PRIVATE HEALTH INSURANCE | Admitting: Psychiatry

## 2013-06-19 VITALS — BP 140/88 | Ht 64.0 in | Wt 269.0 lb

## 2013-06-19 DIAGNOSIS — F4310 Post-traumatic stress disorder, unspecified: Secondary | ICD-10-CM

## 2013-06-19 MED ORDER — LAMOTRIGINE 100 MG PO TABS: 100.0000 mg | ORAL_TABLET | Freq: Two times a day (BID) | ORAL | Status: DC

## 2013-06-19 MED ORDER — ALPRAZOLAM 1 MG PO TABS: 1.0000 mg | ORAL_TABLET | Freq: Four times a day (QID) | ORAL | Status: DC | PRN

## 2013-06-19 MED ORDER — TRAZODONE HCL 150 MG PO TABS: 300.0000 mg | ORAL_TABLET | Freq: Every day | ORAL | Status: DC

## 2013-06-19 MED ORDER — GABAPENTIN 400 MG PO CAPS: 400.0000 mg | ORAL_CAPSULE | Freq: Three times a day (TID) | ORAL | Status: DC
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
## 2013-06-20 ENCOUNTER — Other Ambulatory Visit: Payer: Self-pay | Admitting: Family Medicine

## 2013-06-20 NOTE — Telephone Encounter (Signed)
Medication not noted on med review. Medication refused and letter sent to schedule appointment.

## 2013-06-24 ENCOUNTER — Ambulatory Visit: Payer: PRIVATE HEALTH INSURANCE | Admitting: Family Medicine

## 2013-06-26 ENCOUNTER — Encounter (INDEPENDENT_AMBULATORY_CARE_PROVIDER_SITE_OTHER): Payer: Self-pay

## 2013-06-26 ENCOUNTER — Ambulatory Visit (INDEPENDENT_AMBULATORY_CARE_PROVIDER_SITE_OTHER): Payer: PRIVATE HEALTH INSURANCE | Admitting: Adult Health

## 2013-06-26 ENCOUNTER — Encounter: Payer: Self-pay | Admitting: Adult Health

## 2013-06-26 VITALS — BP 130/86 | Ht 64.0 in | Wt 272.0 lb

## 2013-06-26 DIAGNOSIS — L299 Pruritus, unspecified: Secondary | ICD-10-CM | POA: Insufficient documentation

## 2013-06-26 DIAGNOSIS — N898 Other specified noninflammatory disorders of vagina: Secondary | ICD-10-CM

## 2013-06-26 DIAGNOSIS — A599 Trichomoniasis, unspecified: Secondary | ICD-10-CM | POA: Insufficient documentation

## 2013-06-26 HISTORY — DX: Trichomoniasis, unspecified: A59.9

## 2013-06-26 HISTORY — DX: Pruritus, unspecified: L29.9

## 2013-06-26 HISTORY — DX: Other specified noninflammatory disorders of vagina: N89.8

## 2013-06-26 LAB — POCT WET PREP (WET MOUNT)
TRICHOMONAS WET PREP HPF POC: POSITIVE
WBC, Wet Prep HPF POC: POSITIVE

## 2013-06-26 MED ORDER — LEVOCETIRIZINE DIHYDROCHLORIDE 5 MG PO TABS: ORAL_TABLET | ORAL | Status: DC

## 2013-06-26 MED ORDER — METRONIDAZOLE 500 MG PO TABS: ORAL_TABLET | ORAL | Status: DC

## 2013-06-26 NOTE — Patient Instructions (Signed)
Trichomoniasis Trichomoniasis is an infection, caused by the Trichomonas organism, that affects both women and men. In women, the outer female genitalia and the vagina are affected. In men, the penis is mainly affected, but the prostate and other reproductive organs can also be involved. Trichomoniasis is a sexually transmitted disease (STD) and is most often passed to another person through sexual contact. The majority of people who get trichomoniasis do so from a sexual encounter and are also at risk for other STDs. CAUSES   Sexual intercourse with an infected partner.  It can be present in swimming pools or hot tubs. SYMPTOMS   Abnormal gray-green frothy vaginal discharge in women.  Vaginal itching and irritation in women.  Itching and irritation of the area outside the vagina in women.  Penile discharge with or without pain in males.  Inflammation of the urethra (urethritis), causing painful urination.  Bleeding after sexual intercourse. RELATED COMPLICATIONS  Pelvic inflammatory disease.  Infection of the uterus (endometritis).  Infertility.  Tubal (ectopic) pregnancy.  It can be associated with other STDs, including gonorrhea and chlamydia, hepatitis B, and HIV. COMPLICATIONS DURING PREGNANCY  Early (premature) delivery.  Premature rupture of the membranes (PROM).  Low birth weight. DIAGNOSIS   Visualization of Trichomonas under the microscope from the vagina discharge.  Ph of the vagina greater than 4.5, tested with a test tape.  Trich Rapid Test.  Culture of the organism, but this is not usually needed.  It may be found on a Pap test.  Having a "strawberry cervix,"which means the cervix looks very red like a strawberry. TREATMENT   You may be given medication to fight the infection. Inform your caregiver if you could be or are pregnant. Some medications used to treat the infection should not be taken during pregnancy.  Over-the-counter medications or  creams to decrease itching or irritation may be recommended.  Your sexual partner will need to be treated if infected. HOME CARE INSTRUCTIONS   Take all medication prescribed by your caregiver.  Take over-the-counter medication for itching or irritation as directed by your caregiver.  Do not have sexual intercourse while you have the infection.  Do not douche or wear tampons.  Discuss your infection with your partner, as your partner may have acquired the infection from you. Or, your partner may have been the person who transmitted the infection to you.  Have your sex partner examined and treated if necessary.  Practice safe, informed, and protected sex.  See your caregiver for other STD testing. SEEK MEDICAL CARE IF:   You still have symptoms after you finish the medication.  You have an oral temperature above 102 F (38.9 C).  You develop belly (abdominal) pain.  You have pain when you urinate.  You have bleeding after sexual intercourse.  You develop a rash.  The medication makes you sick or makes you throw up (vomit). Document Released: 09/28/2000 Document Revised: 06/27/2011 Document Reviewed: 10/24/2008 [facility] Patient Information 2014 [place], [place]. Take 4 flagyl now no alcohol No sex  Return in 10 days for proof of treatment Use eucerin lotion

## 2013-06-26 NOTE — Progress Notes (Signed)
Subjective:     Patient ID: Annette Galvan, female   DOB: 1960-07-18, 53 y.o.   MRN: [id number]  HPI Annette Galvan is 53 year old white female in complaining of vaginal discharge and itching on arms and legs, used new soap.Has used bath salts and had sex with condom.  Review of Systems See HPI Reviewed past medical,surgical, social and family history. Reviewed medications and allergies.     Objective:   Physical Exam BP 130/86  Ht 5\' 4"  (1.626 m)  Wt 272 lb (123.378 kg)  BMI 46.67 kg/m2Has scratch marks on arms,   Skin warm and dry.Pelvic: external genitalia is normal in appearance, vagina: creamy frothy discharge with odor, vaginal side walls red,, cervix and uterus are absent, adnexa: no masses or tenderness noted. Wet prep: + for trich and +WBCs. GC/CHL obtained.   Assessment:     Vaginal discharge  Trichomonas Itching ?contact dermatitis      Plan:    GC/CHL Rx flagyl 500 mg # 4 take 4 po now no sex, no alcohol and review handout on trich, will treat partner when calls with his name Return in 10 days for POC Rx xyzal 5 mg 1 at hs #30 with 1 refill Use eucerin lotion Use condoms

## 2013-06-27 ENCOUNTER — Telehealth: Payer: Self-pay | Admitting: Adult Health

## 2013-06-27 LAB — GC/CHLAMYDIA PROBE AMP
CT PROBE, AMP APTIMA: NEGATIVE
GC Probe RNA: NEGATIVE

## 2013-06-27 NOTE — Telephone Encounter (Signed)
Pt aware test negative 

## 2013-07-02 ENCOUNTER — Other Ambulatory Visit: Payer: Self-pay | Admitting: Adult Health

## 2013-07-02 ENCOUNTER — Telehealth: Payer: Self-pay

## 2013-07-04 ENCOUNTER — Ambulatory Visit (HOSPITAL_COMMUNITY): Payer: Self-pay | Admitting: Psychiatry

## 2013-07-05 ENCOUNTER — Ambulatory Visit: Payer: PRIVATE HEALTH INSURANCE | Admitting: Adult Health

## 2013-07-08 ENCOUNTER — Ambulatory Visit: Payer: PRIVATE HEALTH INSURANCE | Admitting: Adult Health

## 2013-07-09 ENCOUNTER — Encounter (INDEPENDENT_AMBULATORY_CARE_PROVIDER_SITE_OTHER): Payer: PRIVATE HEALTH INSURANCE | Admitting: Family Medicine

## 2013-07-10 NOTE — Progress Notes (Signed)
Patient ID: Annette Galvan, female   DOB: 03-30-1961, 53 y.o.   MRN: [id number]

## 2013-07-24 ENCOUNTER — Encounter: Payer: Self-pay | Admitting: Adult Health

## 2013-07-24 ENCOUNTER — Ambulatory Visit (INDEPENDENT_AMBULATORY_CARE_PROVIDER_SITE_OTHER): Payer: PRIVATE HEALTH INSURANCE | Admitting: Adult Health

## 2013-07-24 VITALS — BP 142/98 | Ht 64.0 in | Wt 276.0 lb

## 2013-07-24 DIAGNOSIS — N898 Other specified noninflammatory disorders of vagina: Secondary | ICD-10-CM

## 2013-07-24 LAB — POCT WET PREP (WET MOUNT)
Trichomonas Wet Prep HPF POC: NEGATIVE
WBC WET PREP: NEGATIVE

## 2013-07-24 NOTE — Progress Notes (Signed)
Subjective:     Patient ID: Annette Galvan, female   DOB: [birth year], 53 y.o.   MRN: [id number]  HPI Annette Galvan is back for proof of treatment for recent trich and says she feels wet.  Review of Systems See HPI  Reviewed past medical,surgical, social and family history. Reviewed medications and allergies.     Objective:   Physical Exam BP 142/98  Ht 5\' 4"  (1.626 m)  Wt 276 lb (125.193 kg)  BMI 47.35 kg/m2   Skin warm and dry.Pelvic: external genitalia is normal in appearance, vagina: white discharge without odor, cervix and uterus are absent, adnexa: no masses or tenderness noted. Wet prep: negative  Discussed skin touching skin in groin, keep clean and dry and lose weight.  Assessment:     Vaginal discharge Proof of treatment for recent trich    Plan:     Try luvena Use condoms  Follow up prn

## 2013-07-24 NOTE — Patient Instructions (Signed)
Use condoms Follow up prn 

## 2013-08-19 ENCOUNTER — Ambulatory Visit (HOSPITAL_COMMUNITY): Payer: Self-pay | Admitting: Psychiatry

## 2013-08-20 ENCOUNTER — Ambulatory Visit: Payer: PRIVATE HEALTH INSURANCE | Admitting: Family Medicine

## 2013-08-26 ENCOUNTER — Encounter (HOSPITAL_COMMUNITY): Payer: Self-pay | Admitting: Psychiatry

## 2013-08-26 ENCOUNTER — Ambulatory Visit (INDEPENDENT_AMBULATORY_CARE_PROVIDER_SITE_OTHER): Payer: PRIVATE HEALTH INSURANCE | Admitting: Psychiatry

## 2013-08-26 VITALS — BP 130/90 | Ht 64.0 in | Wt 267.0 lb

## 2013-08-26 DIAGNOSIS — F4310 Post-traumatic stress disorder, unspecified: Secondary | ICD-10-CM

## 2013-08-26 MED ORDER — GABAPENTIN 400 MG PO CAPS: 400.0000 mg | ORAL_CAPSULE | Freq: Three times a day (TID) | ORAL | Status: DC

## 2013-08-26 MED ORDER — LAMOTRIGINE 100 MG PO TABS: 100.0000 mg | ORAL_TABLET | Freq: Two times a day (BID) | ORAL | Status: DC

## 2013-08-26 MED ORDER — TRAZODONE HCL 150 MG PO TABS: 300.0000 mg | ORAL_TABLET | Freq: Every day | ORAL | Status: DC

## 2013-08-26 MED ORDER — ALPRAZOLAM 1 MG PO TABS: 1.0000 mg | ORAL_TABLET | Freq: Four times a day (QID) | ORAL | Status: DC | PRN
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
## 2013-09-04 ENCOUNTER — Other Ambulatory Visit: Payer: Self-pay | Admitting: Family Medicine

## 2013-09-05 ENCOUNTER — Telehealth: Payer: Self-pay | Admitting: *Deleted

## 2013-09-05 NOTE — Telephone Encounter (Signed)
Received fax from pharmacy requesting refill on Cyclobenzaprine 10mg  PO TID PRN.   Ok to refill??  Last office visit 05/17/2013.   Last refill 05/18/2012.  Last dispensed from pharmacy 11/10/2012.

## 2013-09-05 NOTE — Telephone Encounter (Signed)
Refill appropriate and filled per protocol. 

## 2013-09-06 MED ORDER — CYCLOBENZAPRINE HCL 10 MG PO TABS: 10.0000 mg | ORAL_TABLET | Freq: Three times a day (TID) | ORAL | Status: DC | PRN

## 2013-09-06 NOTE — Telephone Encounter (Signed)
SEND #20 TABLETS, NO REFILLS

## 2013-09-06 NOTE — Telephone Encounter (Signed)
Prescription sent to pharmacy.

## 2013-09-16 ENCOUNTER — Other Ambulatory Visit: Payer: Self-pay | Admitting: Adult Health

## 2013-09-24 ENCOUNTER — Other Ambulatory Visit: Payer: Self-pay | Admitting: Family Medicine

## 2013-09-25 NOTE — Telephone Encounter (Signed)
Prescription sent to pharmacy.

## 2013-09-26 ENCOUNTER — Emergency Department (HOSPITAL_COMMUNITY)
Admission: EM | Admit: 2013-09-26 | Discharge: 2013-09-26 | Disposition: A | Discharge status: Home / Self Care | Payer: PRIVATE HEALTH INSURANCE | Attending: Emergency Medicine | Admitting: Emergency Medicine

## 2013-09-26 ENCOUNTER — Emergency Department (HOSPITAL_COMMUNITY): Payer: PRIVATE HEALTH INSURANCE

## 2013-09-26 ENCOUNTER — Encounter (HOSPITAL_COMMUNITY): Payer: Self-pay | Admitting: Emergency Medicine

## 2013-09-26 DIAGNOSIS — Z872 Personal history of diseases of the skin and subcutaneous tissue: Secondary | ICD-10-CM | POA: Diagnosis not present

## 2013-09-26 DIAGNOSIS — IMO0002 Reserved for concepts with insufficient information to code with codable children: Secondary | ICD-10-CM | POA: Diagnosis not present

## 2013-09-26 DIAGNOSIS — M129 Arthropathy, unspecified: Secondary | ICD-10-CM | POA: Diagnosis not present

## 2013-09-26 DIAGNOSIS — M179 Osteoarthritis of knee, unspecified: Secondary | ICD-10-CM

## 2013-09-26 DIAGNOSIS — Z8619 Personal history of other infectious and parasitic diseases: Secondary | ICD-10-CM | POA: Diagnosis not present

## 2013-09-26 DIAGNOSIS — Z91040 Latex allergy status: Secondary | ICD-10-CM | POA: Insufficient documentation

## 2013-09-26 DIAGNOSIS — K219 Gastro-esophageal reflux disease without esophagitis: Secondary | ICD-10-CM | POA: Diagnosis not present

## 2013-09-26 DIAGNOSIS — Z880 Allergy status to penicillin: Secondary | ICD-10-CM | POA: Diagnosis not present

## 2013-09-26 DIAGNOSIS — F319 Bipolar disorder, unspecified: Secondary | ICD-10-CM | POA: Diagnosis not present

## 2013-09-26 DIAGNOSIS — Z87440 Personal history of urinary (tract) infections: Secondary | ICD-10-CM | POA: Insufficient documentation

## 2013-09-26 DIAGNOSIS — M1710 Unilateral primary osteoarthritis, unspecified knee: Secondary | ICD-10-CM | POA: Diagnosis not present

## 2013-09-26 DIAGNOSIS — Z8742 Personal history of other diseases of the female genital tract: Secondary | ICD-10-CM | POA: Insufficient documentation

## 2013-09-26 DIAGNOSIS — G8929 Other chronic pain: Secondary | ICD-10-CM | POA: Insufficient documentation

## 2013-09-26 DIAGNOSIS — F17200 Nicotine dependence, unspecified, uncomplicated: Secondary | ICD-10-CM | POA: Diagnosis not present

## 2013-09-26 DIAGNOSIS — Z79899 Other long term (current) drug therapy: Secondary | ICD-10-CM | POA: Insufficient documentation

## 2013-09-26 DIAGNOSIS — M25569 Pain in unspecified knee: Secondary | ICD-10-CM | POA: Diagnosis present

## 2013-09-26 DIAGNOSIS — Z791 Long term (current) use of non-steroidal anti-inflammatories (NSAID): Secondary | ICD-10-CM | POA: Insufficient documentation

## 2013-09-26 MED ORDER — HYDROCODONE-ACETAMINOPHEN 5-325 MG PO TABS: 1.0000 | ORAL_TABLET | ORAL | Status: DC | PRN

## 2013-09-26 MED ORDER — MELOXICAM 15 MG PO TABS: 15.0000 mg | ORAL_TABLET | Freq: Every day | ORAL | Status: DC

## 2013-09-26 NOTE — ED Provider Notes (Signed)
CSN: [id number]     Arrival date & time 09/26/13  1440 History   First MD Initiated Contact with Patient 09/26/13 1519     Chief Complaint  Patient presents with  . Knee Pain     (Consider location/radiation/quality/duration/timing/severity/associated sxs/prior Treatment) HPI Comments: Patient is a 53 year old female who presents to the emergency department with a complaint of left knee pain. The patient states that she sustained a fall in February 2015. She states she's been having increasing pain since that time. She has been evaluated by an orthopedic physician in [facility], and told that she would probably need knee replacement. The patient has been hesitant to have surgery or any reevaluation. Recently she states that her knee has been hurting her almost nonstop. She presents now because she wants an x-ray of the knee to evaluate for fracture that may not have healed right back in February, or other problems. She states she has tried Voltaren gel and ibuprofen with minimal improvement.  The history is provided by the patient.    Past Medical History  Diagnosis Date  . Bipolar 1 disorder   . Back pain, chronic   . Leg pain   . HSV-2 (herpes simplex virus 2) infection 2013  . Bilateral chronic knee pain   . Fibromyalgia   . Hx of trichomoniasis   . Arthritis     needs 2 knee replacements  . Bulging disc   . UTI (lower urinary tract infection) 10/12/2012  . GERD (gastroesophageal reflux disease)   . Vaginal odor 12/28/2012    Had discharge +clue  . Yeast infection 02/04/2013  . Vaginal discharge 06/26/2013  . Trichimoniasis 06/26/2013  . Itching 06/26/2013   Past Surgical History  Procedure Laterality Date  . Abdominal hysterectomy    . Breast enhancement surgery    . Tubal ligation     Family History  Problem Relation Age of Onset  . Hypertension Mother   . Diabetes Mother   . Depression Mother   . Hyperlipidemia Mother   . Fibromyalgia Mother   . Cancer  Father     bone  . Hyperlipidemia Father   . Hypertension Father   . Bipolar disorder Father   . Depression Sister   . Hyperlipidemia Sister   . Fibromyalgia Sister   . Fibromyalgia Sister   . Colon cancer Neg Hx   . Bipolar disorder Other   . Drug abuse Other   . Alcohol abuse Other    History  Substance Use Topics  . Smoking status: Light Tobacco Smoker -- 0.25 packs/day    Types: Cigarettes  . Smokeless tobacco: Never Used  . Alcohol Use: Yes     Comment: beer occasionally   OB History   Grav Para Term Preterm Abortions TAB SAB Ect Mult Living   2 2        2      Review of Systems  Constitutional: Negative for activity change.       All ROS Neg except as noted in HPI  HENT: Negative for nosebleeds.   Eyes: Negative for photophobia and discharge.  Respiratory: Negative for cough, shortness of breath and wheezing.   Cardiovascular: Negative for chest pain and palpitations.  Gastrointestinal: Negative for abdominal pain and blood in stool.  Genitourinary: Negative for dysuria, frequency and hematuria.  Musculoskeletal: Positive for arthralgias and back pain. Negative for neck pain.  Skin: Negative.   Neurological: Negative for dizziness, seizures and speech difficulty.  Psychiatric/Behavioral: Negative for hallucinations and  confusion.      Allergies  Latex and Ampicillin  Home Medications   Prior to Admission medications   Medication Sig Start Date End Date Taking? Authorizing Provider  ALPRAZolam Duanne Moron) 1 MG tablet Take 1 tablet (1 mg total) by mouth 4 (four) times daily as needed for anxiety. 08/26/13  Yes [name], MD  cetirizine (ZYRTEC) 10 MG tablet Take 10 mg by mouth daily.   Yes Historical Provider, MD  fluticasone (FLONASE) 50 MCG/ACT nasal spray Place 2 sprays into both nostrils daily.   Yes Historical Provider, MD  gabapentin (NEURONTIN) 400 MG capsule Take 1 capsule (400 mg total) by mouth 3 (three) times daily. 08/26/13  Yes [name], MD   ibuprofen (ADVIL,MOTRIN) 800 MG tablet Take 800 mg by mouth 2 (two) times daily as needed. pain   Yes Historical Provider, MD  lamoTRIgine (LAMICTAL) 100 MG tablet Take 1 tablet (100 mg total) by mouth 2 (two) times daily. 08/26/13  Yes [name], MD  levocetirizine (XYZAL) 5 MG tablet Take 5 mg by mouth at bedtime.   Yes Historical Provider, MD  omeprazole (PRILOSEC) 20 MG capsule Take 20 mg by mouth daily.   Yes Historical Provider, MD  traZODone (DESYREL) 150 MG tablet Take 2 tablets (300 mg total) by mouth at bedtime. 08/26/13  Yes [name], MD  VOLTAREN 1 % GEL Apply 4 g topically 4 (four) times daily.  09/18/12  Yes Historical Provider, MD   BP 140/79  Pulse 97  Temp(Src) 98.2 F (36.8 C)  Resp 20  Ht 5\' 4"  (1.626 m)  Wt 271 lb (122.925 kg)  BMI 46.49 kg/m2  SpO2 96% Physical Exam  Nursing note and vitals reviewed. Constitutional: She is oriented to person, place, and time. She appears well-developed and well-nourished.  Non-toxic appearance.  HENT:  Head: Normocephalic.  Right Ear: Tympanic membrane and external ear normal.  Left Ear: Tympanic membrane and external ear normal.  Eyes: EOM and lids are normal. Pupils are equal, round, and reactive to light.  Neck: Normal range of motion. Neck supple. Carotid bruit is not present.  Cardiovascular: Normal rate, regular rhythm, normal heart sounds, intact distal pulses and normal pulses.   Pulmonary/Chest: Breath sounds normal. No respiratory distress.  Abdominal: Soft. Bowel sounds are normal. There is no tenderness. There is no guarding.  Musculoskeletal: Normal range of motion.  There is good range of motion of the left hip. There is decreased range of motion of the left knee. There is some pain of the posterior knee. There is crepitus present. No deformity of the quadricep area, or the anterior tibial tuberosity. There is full range of motion of the ankle and toes. The dorsalis pedis pulse is 2+. No hot joints appreciated.   Lymphadenopathy:       Head (right side): No submandibular adenopathy present.       Head (left side): No submandibular adenopathy present.    She has no cervical adenopathy.  Neurological: She is alert and oriented to person, place, and time. She has normal strength. No cranial nerve deficit or sensory deficit.  Skin: Skin is warm and dry.  Psychiatric: She has a normal mood and affect. Her speech is normal.    ED Course  Procedures (including critical care time) Labs Review Labs Reviewed - No data to display  Imaging Review Dg Knee Complete 4 Views Left  09/26/2013   CLINICAL DATA:  Fall.  Knee pain  EXAM: LEFT KNEE - COMPLETE 4+ VIEW  COMPARISON:  None.  FINDINGS: Negative for fracture. There is moderate degenerative change with joint space narrowing and spurring in all 3 compartments, most severe medially.  IMPRESSION: Negative for fracture.  Moderate osteoarthritis.   Electronically Signed   By: Franchot Gallo M.D.   On: 09/26/2013 15:13     EKG Interpretation None      MDM vital signs are well within normal limits. Pulse oximetry is 96% on room air. Within normal limits by my interpretation. The x-ray of the left knee is negative for fracture, but shows advanced degenerative changes.  The patient is fitted with a knee immobilizer. Prescription for Mobic and Norco given to the patient. I've advised patient to see her orthopedic surgeon as sone as possible for additional evaluation and management.    Final diagnoses:  None    *I have reviewed nursing notes, vital signs, and all appropriate lab and imaging results for this patient.Lenox Ahr, PA-C 09/26/13 1545

## 2013-09-26 NOTE — ED Provider Notes (Signed)
Medical screening examination/treatment/procedure(s) were performed by non-physician practitioner and as supervising physician I was immediately available for consultation/collaboration.   EKG Interpretation None        Maudry Diego, MD 09/26/13 2016

## 2013-09-26 NOTE — Discharge Instructions (Signed)
your x-ray is negative for fracture, but does show advancing arthritis. Please use your knee immobilizer when up and about. Please see your orthopedic physician as sone as possible. Please use Mobic daily with food. May use Norco for pain, this medication may cause drowsiness, please use with caution. Osteoarthritis Osteoarthritis is a disease that causes soreness and swelling (inflammation) of a joint. It occurs when the cartilage at the affected joint wears down. Cartilage acts as a cushion, covering the ends of bones where they meet to form a joint. Osteoarthritis is the most common form of arthritis. It often occurs in older people. The joints affected most often by this condition include those in the:  Ends of the fingers.  Thumbs.  Neck.  Lower back.  Knees.  Hips. CAUSES  Over time, the cartilage that covers the ends of bones begins to wear away. This causes bone to rub on bone, producing pain and stiffness in the affected joints.  RISK FACTORS Certain factors can increase your chances of having osteoarthritis, including:  Older age.  Excessive body weight.  Overuse of joints. SIGNS AND SYMPTOMS   Pain, swelling, and stiffness in the joint.  Over time, the joint may lose its normal shape.  Small deposits of bone (osteophytes) may grow on the edges of the joint.  Bits of bone or cartilage can break off and float inside the joint space. This may cause more pain and damage. DIAGNOSIS  Your health care provider will do a physical exam and ask about your symptoms. Various tests may be ordered, such as:  X-rays of the affected joint.  An MRI scan.  Blood tests to rule out other types of arthritis.  Joint fluid tests. This involves using a needle to draw fluid from the joint and examining the fluid under a microscope. TREATMENT  Goals of treatment are to control pain and improve joint function. Treatment plans may include:  A prescribed exercise program that allows for  rest and joint relief.  A weight control plan.  Pain relief techniques, such as:  Properly applied heat and cold.  Electric pulses delivered to nerve endings under the skin (transcutaneous electrical nerve stimulation, TENS).  Massage.  Certain nutritional supplements.  Medicines to control pain, such as:  Acetaminophen.  Nonsteroidal anti-inflammatory drugs (NSAIDs), such as naproxen.  Narcotic or central-acting agents, such as tramadol.  Corticosteroids. These can be given orally or as an injection.  Surgery to reposition the bones and relieve pain (osteotomy) or to remove loose pieces of bone and cartilage. Joint replacement may be needed in advanced states of osteoarthritis. HOME CARE INSTRUCTIONS   Only take over-the-counter or prescription medicines as directed by your health care provider. Take all medicines exactly as instructed.  Maintain a healthy weight. Follow your health care provider's instructions for weight control. This may include dietary instructions.  Exercise as directed. Your health care provider can recommend specific types of exercise. These may include:  Strengthening exercises These are done to strengthen the muscles that support joints affected by arthritis. They can be performed with weights or with exercise bands to add resistance.  Aerobic activities These are exercises, such as brisk walking or low-impact aerobics, that get your heart pumping.  Range-of-motion activities These keep your joints limber.  Balance and agility exercises These help you maintain daily living skills.  Rest your affected joints as directed by your health care provider.  Follow up with your health care provider as directed. SEEK MEDICAL CARE IF:   Your  skin turns red.  You develop a rash in addition to your joint pain.  You have worsening joint pain. SEEK IMMEDIATE MEDICAL CARE IF:  You have a significant loss of weight or appetite.  You have a fever along  with joint or muscle aches.  You have night sweats. [place] of Arthritis and Musculoskeletal and Skin Diseases: www.niams.SouthExposed.es [employer] on Aging: http://kim-miller.com/ American College of Rheumatology: www.rheumatology.org Document Released: 04/04/2005 Document Revised: 01/23/2013 Document Reviewed: 12/10/2012 [facility] Patient Information 2014 [place], [place].

## 2013-09-26 NOTE — ED Notes (Signed)
Pt verbalized understanding to use caution when use Norco due to med causes drowsiness, pt states she does not drive

## 2013-09-26 NOTE — ED Notes (Signed)
Complain of pain in left knee from a fall. States she wants a x-ray

## 2013-10-07 ENCOUNTER — Ambulatory Visit: Payer: PRIVATE HEALTH INSURANCE | Admitting: Family Medicine

## 2013-10-08 ENCOUNTER — Ambulatory Visit: Payer: PRIVATE HEALTH INSURANCE | Admitting: Family Medicine

## 2013-10-21 ENCOUNTER — Other Ambulatory Visit: Payer: Self-pay | Admitting: Adult Health

## 2013-10-29 ENCOUNTER — Ambulatory Visit: Payer: PRIVATE HEALTH INSURANCE | Admitting: Family Medicine

## 2013-10-30 ENCOUNTER — Ambulatory Visit (INDEPENDENT_AMBULATORY_CARE_PROVIDER_SITE_OTHER): Payer: PRIVATE HEALTH INSURANCE | Admitting: Family Medicine

## 2013-10-30 ENCOUNTER — Encounter: Payer: Self-pay | Admitting: Family Medicine

## 2013-10-30 VITALS — BP 130/78 | HR 68 | Temp 97.4°F | Resp 16 | Ht 65.0 in | Wt 278.0 lb

## 2013-10-30 DIAGNOSIS — J0100 Acute maxillary sinusitis, unspecified: Secondary | ICD-10-CM

## 2013-10-30 DIAGNOSIS — M170 Bilateral primary osteoarthritis of knee: Secondary | ICD-10-CM

## 2013-10-30 DIAGNOSIS — M1710 Unilateral primary osteoarthritis, unspecified knee: Secondary | ICD-10-CM

## 2013-10-30 DIAGNOSIS — F319 Bipolar disorder, unspecified: Secondary | ICD-10-CM

## 2013-10-30 MED ORDER — PROMETHAZINE HCL 12.5 MG PO TABS: 12.5000 mg | ORAL_TABLET | Freq: Three times a day (TID) | ORAL | Status: DC | PRN

## 2013-10-30 MED ORDER — MELOXICAM 15 MG PO TABS: 15.0000 mg | ORAL_TABLET | Freq: Every day | ORAL | Status: DC

## 2013-10-30 MED ORDER — AZITHROMYCIN 250 MG PO TABS
ORAL_TABLET | ORAL | Status: DC
Start: 2013-10-30 — End: 2013-11-11

## 2013-10-30 MED ORDER — LORCASERIN HCL 10 MG PO TABS: ORAL_TABLET | ORAL | Status: DC

## 2013-10-30 NOTE — Progress Notes (Signed)
Patient ID: Annette Galvan, female   DOB: [birth year], 53 y.o.   MRN: [id number]   Subjective:    Patient ID: Annette Galvan, female    DOB: 22-Dec-1960, 53 y.o.   MRN: [id number]  Patient presents for Forms, Belviq, Sinus Infection and Nausea  patient here follow up chronic medical problems. She was last seen in January 2015 at that time she was started on weight loss supplement Belviq however she did not followup afterwards. She states that she lost about 10 pounds in the 3 trauma to continue however she was unable to afford. She was to go back on the medication with assistance of a patient assistance program.  Severe osteoarthritis and degenerative disease. She was being followed by orthopedics he recommended knee surgery however they're trying to hold off at this time do to instability at home. She was in emergency room recently he was given meloxicam which helped as well as hydrocodone prescription. She understands my concerns about her being on chronic pain medication especially without monitoring. She initially requested be referred to a pain clinic. She also requests personal care services to help her at home. She has difficulty getting in and out of her bathtub she also has a bedside commode she is very stiff in the mornings it is very weary that she will fall. She does have children however they can only help her so much. She needs assistance with getting to the grocery store and other places for her routine care. She tells me that she often has to sit in chair to do her dishes or cook her meals because she is unable to stand for long periods of time.   Bipolar she still being followed by psychiatry and taking her medications as prescribed.   Sinus drainage congestion for the past week. She has been using Flonase as well as her allergy medication with minimal improvement. She has pressure and pain mostly on the left side. She denies any fever no significant cough. She has had some mild nausea no  emesis past couple of days, no change in bowels    Review Of Systems:  GEN- denies fatigue, fever, weight loss,weakness, recent illness HEENT- denies eye drainage, change in vision, +nasal discharge, CVS- denies chest pain, palpitations RESP- denies SOB, cough, wheeze ABD- +N/ denies V, change in stools, abd pain GU- denies dysuria, hematuria, dribbling, incontinence MSK- denies joint pain, muscle aches, injury Neuro- denies headache, dizziness, syncope, seizure activity       Objective:    BP 130/78  Pulse 68  Temp(Src) 97.4 F (36.3 C) (Oral)  Resp 16  Ht 5\' 5"  (1.651 m)  Wt 278 lb (126.1 kg)  BMI 46.26 kg/m2 GEN- NAD, alert and oriented x3 HEENT- PERRL, EOMI, non injected sclera, pink conjunctiva, MMM, oropharynx clear, +left maxillary sinus tenderness, clear rhinorrhea, TM clear bilat, no effusion, canals clear Neck- Supple, no thyromegaly CVS- RRR, no murmur RESP-CTAB ABD-NABS,soft,NT,ND EXT- No edema Psych- tearful discussing weight and pain, not anxious appearing, well groomed Pulses- Radial, DP- 2+        Assessment & Plan:      Problem List Items Addressed This Visit   None      Note: This dictation was prepared with Dragon dictation along with smaller phrase technology. Any transcriptional errors that result from this process are unintentional.

## 2013-10-30 NOTE — Assessment & Plan Note (Signed)
Zpak, flonase

## 2013-10-30 NOTE — Assessment & Plan Note (Signed)
Reviewed psychiatry note, appears to be doing well overall on meds

## 2013-10-30 NOTE — Assessment & Plan Note (Addendum)
Severe OA of knees, needs knee replacement has had injections, refilled mobic I think she will need chronic pain control, as surgery is not an option based on finances and support right now With her psychiatric medications I would defer to a pain clinic  She would benefit from [facility] services in her home

## 2013-10-30 NOTE — Patient Instructions (Signed)
Take the zpak , use flonase for 1 week Take the phenergan  Pain clinic referral  PCS services We will check into the Belviq  Meloxicam once a day  F/U 3 months

## 2013-10-30 NOTE — Assessment & Plan Note (Signed)
Will try the patient assistance program for the Belviq for her, her OA and depression would benefit from weight loss, help reduce cardiovascular risk

## 2013-10-31 ENCOUNTER — Other Ambulatory Visit: Payer: Self-pay | Admitting: *Deleted

## 2013-10-31 MED ORDER — LORCASERIN HCL 10 MG PO TABS: ORAL_TABLET | ORAL | Status: DC

## 2013-10-31 NOTE — Telephone Encounter (Signed)
Prescription printed for patient assistance program.

## 2013-11-11 ENCOUNTER — Telehealth: Payer: Self-pay | Admitting: *Deleted

## 2013-11-11 MED ORDER — AZITHROMYCIN 250 MG PO TABS: ORAL_TABLET | ORAL | Status: DC

## 2013-11-11 NOTE — Telephone Encounter (Signed)
Okay to refill, if still  Not improved, make a f/u appt

## 2013-11-11 NOTE — Telephone Encounter (Signed)
Call placed to patient and patient made aware per VM.  

## 2013-11-11 NOTE — Telephone Encounter (Signed)
Received fax from pharmacy requesting refill on Z- Pack.   Call placed to patient to inquire about request.   Patient reports that she has completed the Z-Pack, but her sinuses have not cleared up. States that she continues to have sinus pressure above her eyes, low grade fever and nasal drainage. Reports that nasal drainage is no longer green, but it is not clear.   MD please advise.

## 2013-11-18 ENCOUNTER — Telehealth (HOSPITAL_COMMUNITY): Payer: Self-pay | Admitting: *Deleted

## 2013-11-18 NOTE — Telephone Encounter (Signed)
spoke to [employer] [facility] [employer] [facility]) to advice them that Dr. Harrington Challenger will fill pt Gabapentin at her appointment (11-26-13)

## 2013-11-20 ENCOUNTER — Telehealth: Payer: Self-pay | Admitting: Family Medicine

## 2013-11-20 NOTE — Telephone Encounter (Signed)
PT has called and she is wanting to speak to you about [facility]. She states she had contacted them about them coming into her home and they have never received anything from [place].  Pt call back number [phone]

## 2013-11-20 NOTE — Telephone Encounter (Signed)
Call returned to patient.   Advised that PCS forms were faxed to [facility] on 10/30/2013. Original forms mailed to patient for her records.   Re-faxed forms to [facility] on 11/20/2013.   Advised to contact PCS provider and return call to [facility] with name and fax number of company so that forms may be faxed directly to [place].

## 2013-11-22 ENCOUNTER — Telehealth: Payer: Self-pay | Admitting: Family Medicine

## 2013-11-22 NOTE — Telephone Encounter (Signed)
PCS forms faxed to number.

## 2013-11-22 NOTE — Telephone Encounter (Signed)
Patient is calling to let you know when we fax her medical necessity form there is an added fax number and it is  [phone]

## 2013-11-26 ENCOUNTER — Encounter (HOSPITAL_COMMUNITY): Payer: Self-pay | Admitting: Psychiatry

## 2013-11-26 ENCOUNTER — Ambulatory Visit (INDEPENDENT_AMBULATORY_CARE_PROVIDER_SITE_OTHER): Payer: PRIVATE HEALTH INSURANCE | Admitting: Psychiatry

## 2013-11-26 VITALS — BP 119/73 | HR 82 | Ht 65.0 in | Wt 274.0 lb

## 2013-11-26 DIAGNOSIS — F4310 Post-traumatic stress disorder, unspecified: Secondary | ICD-10-CM

## 2013-11-26 MED ORDER — ALPRAZOLAM 1 MG PO TABS: 1.0000 mg | ORAL_TABLET | Freq: Four times a day (QID) | ORAL | Status: DC | PRN

## 2013-11-26 MED ORDER — TRAZODONE HCL 150 MG PO TABS: 300.0000 mg | ORAL_TABLET | Freq: Every day | ORAL | Status: DC

## 2013-11-26 MED ORDER — LAMOTRIGINE 100 MG PO TABS: 100.0000 mg | ORAL_TABLET | Freq: Two times a day (BID) | ORAL | Status: DC

## 2013-11-26 MED ORDER — GABAPENTIN 400 MG PO CAPS: 400.0000 mg | ORAL_CAPSULE | Freq: Three times a day (TID) | ORAL | Status: DC
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
## 2013-12-04 ENCOUNTER — Telehealth: Payer: Self-pay | Admitting: Family Medicine

## 2013-12-04 NOTE — Telephone Encounter (Signed)
[phone]  Pt is calling about the medication Belviq

## 2013-12-04 NOTE — Telephone Encounter (Signed)
Call placed to patient.   States that she has mailed off all the information about the prescription assistance for Belviq, but she has still not heard anything from them.   Advised to contact prescription assistance plan for more information.   States that she has no contact information for them.   Will look into for patient.

## 2013-12-05 ENCOUNTER — Encounter: Payer: Self-pay | Admitting: Adult Health

## 2013-12-05 ENCOUNTER — Ambulatory Visit (INDEPENDENT_AMBULATORY_CARE_PROVIDER_SITE_OTHER): Payer: PRIVATE HEALTH INSURANCE | Admitting: Adult Health

## 2013-12-05 VITALS — BP 140/88 | Ht 63.0 in | Wt 279.0 lb

## 2013-12-05 DIAGNOSIS — N899 Noninflammatory disorder of vagina, unspecified: Secondary | ICD-10-CM

## 2013-12-05 DIAGNOSIS — N898 Other specified noninflammatory disorders of vagina: Secondary | ICD-10-CM

## 2013-12-05 HISTORY — DX: Other specified noninflammatory disorders of vagina: N89.8

## 2013-12-05 LAB — POCT WET PREP (WET MOUNT)
TRICHOMONAS WET PREP HPF POC: NEGATIVE
WBC WET PREP: NEGATIVE

## 2013-12-05 MED ORDER — NYSTATIN 100000 UNIT/GM EX POWD: CUTANEOUS | Status: DC

## 2013-12-05 NOTE — Patient Instructions (Signed)
Try luvena Try nystatin powders No tub baths Use bar soap Keep clean and dry

## 2013-12-05 NOTE — Progress Notes (Signed)
Subjective:     Patient ID: Annette Galvan, female   DOB: [birth year], 53 y.o.   MRN: [id number]  HPI Tensley is a 53 year old white female in complaining of vaginal irritation and odor at times.A!c 5.6 04/2013. Wants to be checked for STD.  Review of Systems See HPI Reviewed past medical,surgical, social and family history. Reviewed medications and allergies.     Objective:   Physical Exam BP 140/88  Ht 5\' 3"  (1.6 m)  Wt 279 lb (126.554 kg)  BMI 49.44 kg/m2 Skin warm and dry.Pelvic: external genitalia is normal in appearance, vagina: white discharge without odor, the cervix and uterus are absent, adnexa: no masses or tenderness noted. Wet prep: normal GC/CHL obtained.     Assessment:     Vaginal discharge Vaginal irritation    Plan:    GC/CHL Try luvena Use Nystatin powders 2-3 x daily as needed Use bar soaps, no tub baths Keep clean and dry

## 2013-12-06 ENCOUNTER — Telehealth: Payer: Self-pay | Admitting: Adult Health

## 2013-12-06 LAB — GC/CHLAMYDIA PROBE AMP
CT PROBE, AMP APTIMA: NEGATIVE
GC Probe RNA: NEGATIVE

## 2013-12-06 NOTE — Telephone Encounter (Signed)
Results still pending. Pt aware. [place]

## 2013-12-06 NOTE — Telephone Encounter (Signed)
Prescription assistance number is [phone].   Call placed to company but was advised that patient would need to contact for information.   Patient made aware.

## 2013-12-09 NOTE — Telephone Encounter (Signed)
Spoke with pt. Pt aware that GC/CHL was negative. [place]

## 2013-12-20 ENCOUNTER — Telehealth: Payer: Self-pay | Admitting: Adult Health

## 2013-12-20 ENCOUNTER — Ambulatory Visit: Payer: PRIVATE HEALTH INSURANCE | Admitting: Family Medicine

## 2013-12-20 ENCOUNTER — Other Ambulatory Visit: Payer: Self-pay | Admitting: Obstetrics and Gynecology

## 2013-12-20 DIAGNOSIS — N3090 Cystitis, unspecified without hematuria: Secondary | ICD-10-CM

## 2013-12-20 MED ORDER — SULFAMETHOXAZOLE-TMP DS 800-160 MG PO TABS: 1.0000 | ORAL_TABLET | Freq: Two times a day (BID) | ORAL | Status: DC

## 2013-12-20 NOTE — Telephone Encounter (Signed)
Spoke with pt. Pt is having back pain and urinary frequency. Can you call something in to help? Thanks!!! [employer]

## 2014-01-17 ENCOUNTER — Ambulatory Visit (INDEPENDENT_AMBULATORY_CARE_PROVIDER_SITE_OTHER): Payer: PRIVATE HEALTH INSURANCE | Admitting: Family Medicine

## 2014-01-17 ENCOUNTER — Encounter: Payer: Self-pay | Admitting: Family Medicine

## 2014-01-17 VITALS — BP 130/60 | HR 80 | Temp 97.7°F | Resp 18 | Ht 63.0 in | Wt 281.0 lb

## 2014-01-17 DIAGNOSIS — M797 Fibromyalgia: Secondary | ICD-10-CM

## 2014-01-17 DIAGNOSIS — R7303 Prediabetes: Secondary | ICD-10-CM

## 2014-01-17 DIAGNOSIS — N159 Renal tubulo-interstitial disease, unspecified: Secondary | ICD-10-CM

## 2014-01-17 DIAGNOSIS — R7309 Other abnormal glucose: Secondary | ICD-10-CM

## 2014-01-17 DIAGNOSIS — M170 Bilateral primary osteoarthritis of knee: Secondary | ICD-10-CM

## 2014-01-17 DIAGNOSIS — G894 Chronic pain syndrome: Secondary | ICD-10-CM

## 2014-01-17 LAB — URINALYSIS, ROUTINE W REFLEX MICROSCOPIC
BILIRUBIN URINE: NEGATIVE
Glucose, UA: NEGATIVE mg/dL
Hgb urine dipstick: NEGATIVE
Ketones, ur: NEGATIVE mg/dL
Leukocytes, UA: NEGATIVE
NITRITE: NEGATIVE
Protein, ur: NEGATIVE mg/dL
Specific Gravity, Urine: 1.02 (ref 1.005–1.030)
UROBILINOGEN UA: 0.2 mg/dL (ref 0.0–1.0)
pH: 5.5 (ref 5.0–8.0)

## 2014-01-17 MED ORDER — LORCASERIN HCL 10 MG PO TABS: ORAL_TABLET | ORAL | Status: DC

## 2014-01-17 NOTE — Patient Instructions (Signed)
Increase water , eat more fiber, yogurt Referral to Pain clinic  Belviq prescription4 Get the labs done in [place] F/U 3 months for weight

## 2014-01-18 NOTE — Assessment & Plan Note (Addendum)
Recheck A1C with weight gain noted  Note UA negative for infection

## 2014-01-18 NOTE — Assessment & Plan Note (Signed)
Belviq script given and faxed to the PA program

## 2014-01-18 NOTE — Progress Notes (Signed)
Patient ID: Annette Galvan, female   DOB: 03/20/1961, 53 y.o.   MRN: [id number]   Subjective:    Patient ID: Annette Galvan, female    DOB: 1961-02-01, 53 y.o.   MRN: [id number]  Patient presents for medication check, Pyelonephritis and Vit B 12 shot  Pt here for intermin visit on obesity, was given Belviq did well with sample but had to wait for financial assitance program, which she has paperwork for today. She has been trying to change her diet, but limited exercise due to her knee and back  She was seen by GYN for vaginitis, pelvic pressure, ? UTI but no results of UA, she was given prophylatic treatment with bactrim, symptoms are now resolved but still gets some pelvic pressure every now and then. No constipation  Inquired about b12 shots to help with weight loss  Chronic back pain and severe OA, does not want to undergo surgical intervention would like referral to new pain clinic currently seen by Dr. Brien Few   Review Of Systems:  GEN- denies fatigue, fever, weight loss,weakness, recent illness HEENT- denies eye drainage, change in vision, nasal discharge, CVS- denies chest pain, palpitations RESP- denies SOB, cough, wheeze ABD- denies N/V, change in stools, abd pain GU- denies dysuria, hematuria, dribbling, incontinence MSK- + joint pain, muscle aches, injury Neuro- denies headache, dizziness, syncope, seizure activity       Objective:    BP 130/60  Pulse 80  Temp(Src) 97.7 F (36.5 C) (Oral)  Resp 18  Ht 5\' 3"  (1.6 m)  Wt 281 lb (127.461 kg)  BMI 49.79 kg/m2 GEN- NAD, alert and oriented x3 CVS- RRR, no murmur RESP-CTAB ABD-NABS,soft,NT,ND, no CVA tenderness EXT- No edema Pulses- Radial, DP- 2+        Assessment & Plan:      Problem List Items Addressed This Visit   OA (osteoarthritis) of knee   Morbid obesity   Relevant Medications      Lorcaserin HCl (BELVIQ) 10 MG TABS   Other Relevant Orders      CBC with Differential   Fibromyalgia   Borderline  diabetic   Relevant Orders      CBC with Differential      Comprehensive metabolic panel      Hemoglobin A1c    Other Visit Diagnoses   Kidney infection    -  Primary    Relevant Orders       Urinalysis, Routine w reflex microscopic (Completed)       Note: This dictation was prepared with Dragon dictation along with smaller phrase technology. Any transcriptional errors that result from this process are unintentional.

## 2014-01-23 ENCOUNTER — Telehealth: Payer: Self-pay | Admitting: Family Medicine

## 2014-01-23 NOTE — Telephone Encounter (Signed)
[facility] calling to talk with you regarding referral for this patient  Please call back (they did not leave a name) to [phone]

## 2014-01-24 NOTE — Telephone Encounter (Signed)
LMTRC

## 2014-02-03 ENCOUNTER — Telehealth: Payer: Self-pay | Admitting: Family Medicine

## 2014-02-03 NOTE — Telephone Encounter (Signed)
Call placed to patient. LMTRC.  

## 2014-02-03 NOTE — Telephone Encounter (Signed)
Patient would like something called in for the flu, she has not way to get here  [phone]

## 2014-02-04 NOTE — Telephone Encounter (Signed)
Call placed to patient. LMTRC.  

## 2014-02-05 NOTE — Telephone Encounter (Signed)
Multiple calls placed to patient with no answer and no return call.   Message to be closed.  

## 2014-02-10 ENCOUNTER — Telehealth: Payer: Self-pay | Admitting: Family Medicine

## 2014-02-10 NOTE — Telephone Encounter (Signed)
Patient is calling regarding her pain doctor appointment  Please call her at [phone]

## 2014-02-10 NOTE — Telephone Encounter (Signed)
[place] to [place] at [facility] for appt on this pt

## 2014-02-11 NOTE — Telephone Encounter (Signed)
Spoke with Varney Biles at [facility] and stated that referral was received and that it is on providers desk in review, once approval will call to set up appt.

## 2014-02-14 ENCOUNTER — Other Ambulatory Visit (HOSPITAL_COMMUNITY): Payer: Self-pay | Admitting: Psychiatry

## 2014-02-17 ENCOUNTER — Telehealth: Payer: Self-pay | Admitting: Family Medicine

## 2014-02-17 ENCOUNTER — Encounter: Payer: Self-pay | Admitting: Family Medicine

## 2014-02-17 ENCOUNTER — Telehealth (HOSPITAL_COMMUNITY): Payer: Self-pay | Admitting: *Deleted

## 2014-02-17 NOTE — Telephone Encounter (Signed)
Please route to [place] to investigate--no evidence of refill request in chart

## 2014-02-17 NOTE — Telephone Encounter (Signed)
[phone] Patient is calling to let us know that they did not accept her at the pain management we referred her too, she needs to be referred to another if possible

## 2014-02-18 NOTE — Telephone Encounter (Signed)
Called pt about her message from previous day. lmtcb

## 2014-02-19 ENCOUNTER — Other Ambulatory Visit (HOSPITAL_COMMUNITY): Payer: Self-pay | Admitting: Psychiatry

## 2014-02-19 MED ORDER — GABAPENTIN 400 MG PO CAPS: 400.0000 mg | ORAL_CAPSULE | Freq: Three times a day (TID) | ORAL | Status: DC

## 2014-02-19 NOTE — Telephone Encounter (Signed)
Pt returning call due to previous message. Per pt she will run out of her gabapentin before her scheduled appt. Pt medication was last filled 11-26-13 with 90 tabs 2 refills. Pt follow up appt is scheduled for 02-26-14. Per pt, she wanted to see if Dr. Harrington Challenger would fill her medication before her scheduled appt.

## 2014-02-19 NOTE — Telephone Encounter (Signed)
Close encounter 

## 2014-02-19 NOTE — Telephone Encounter (Signed)
Re-sent to pharmacy.

## 2014-02-20 NOTE — Telephone Encounter (Signed)
Will try to send to another Pain mgmt

## 2014-02-20 NOTE — Telephone Encounter (Signed)
Pt is aware and shows understanding 

## 2014-02-26 ENCOUNTER — Ambulatory Visit (INDEPENDENT_AMBULATORY_CARE_PROVIDER_SITE_OTHER): Payer: PRIVATE HEALTH INSURANCE | Admitting: Psychiatry

## 2014-02-26 ENCOUNTER — Encounter (HOSPITAL_COMMUNITY): Payer: Self-pay | Admitting: Psychiatry

## 2014-02-26 VITALS — BP 154/68 | HR 76 | Ht 63.0 in | Wt 279.0 lb

## 2014-02-26 DIAGNOSIS — F4310 Post-traumatic stress disorder, unspecified: Secondary | ICD-10-CM

## 2014-02-26 MED ORDER — LAMOTRIGINE 100 MG PO TABS: 100.0000 mg | ORAL_TABLET | Freq: Two times a day (BID) | ORAL | Status: DC

## 2014-02-26 MED ORDER — GABAPENTIN 400 MG PO CAPS: 400.0000 mg | ORAL_CAPSULE | Freq: Three times a day (TID) | ORAL | Status: DC

## 2014-02-26 MED ORDER — ALPRAZOLAM 1 MG PO TABS: 1.0000 mg | ORAL_TABLET | Freq: Four times a day (QID) | ORAL | Status: DC | PRN

## 2014-02-26 MED ORDER — TRAZODONE HCL 150 MG PO TABS: 300.0000 mg | ORAL_TABLET | Freq: Every day | ORAL | Status: DC
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
## 2014-03-03 ENCOUNTER — Telehealth (HOSPITAL_COMMUNITY): Payer: Self-pay | Admitting: *Deleted

## 2014-03-03 NOTE — Telephone Encounter (Signed)
Pt pharmacy requesting pt medication to be refilled via paper script. Per pt records, pt script was last filled 02-26-14.

## 2014-03-05 ENCOUNTER — Telehealth: Payer: Self-pay | Admitting: *Deleted

## 2014-03-05 ENCOUNTER — Other Ambulatory Visit: Payer: Self-pay | Admitting: Family Medicine

## 2014-03-05 DIAGNOSIS — Z1231 Encounter for screening mammogram for malignant neoplasm of breast: Secondary | ICD-10-CM

## 2014-03-05 NOTE — Telephone Encounter (Signed)
Mammogram ordered

## 2014-03-05 NOTE — Telephone Encounter (Signed)
-----   Message from Roney Marion sent at 03/05/2014 12:44 PM EST ----- Regarding: [place]  Patient needs a mammogram.  Thank you Larene Beach

## 2014-03-10 ENCOUNTER — Encounter: Payer: Self-pay | Admitting: *Deleted

## 2014-03-14 ENCOUNTER — Other Ambulatory Visit: Payer: Self-pay | Admitting: Family Medicine

## 2014-03-18 ENCOUNTER — Other Ambulatory Visit: Payer: Self-pay | Admitting: Family Medicine

## 2014-03-18 ENCOUNTER — Other Ambulatory Visit: Payer: Self-pay | Admitting: Adult Health

## 2014-03-18 NOTE — Telephone Encounter (Signed)
Medication refilled per protocol. 

## 2014-03-18 NOTE — Telephone Encounter (Signed)
Refill appropriate and filled per protocol. 

## 2014-03-31 ENCOUNTER — Ambulatory Visit (HOSPITAL_COMMUNITY): Payer: Self-pay | Admitting: Psychiatry

## 2014-03-31 ENCOUNTER — Other Ambulatory Visit: Payer: Self-pay | Admitting: Obstetrics and Gynecology

## 2014-04-01 NOTE — Telephone Encounter (Signed)
Patient requesting refill on antibiotics for presumed UTI. Patient is been requested to call the office for further questioning before decisions regarding additional medications antibiotics

## 2014-04-03 ENCOUNTER — Telehealth: Payer: Self-pay | Admitting: Family Medicine

## 2014-04-03 NOTE — Telephone Encounter (Signed)
Patient would like to speak with a nurse regarding some of her medication (was not specific)  [phone]

## 2014-04-04 MED ORDER — DICLOFENAC SODIUM 1 % TD GEL: 2.0000 g | Freq: Four times a day (QID) | TRANSDERMAL | Status: DC

## 2014-04-04 NOTE — Telephone Encounter (Signed)
Okay to refill voltaren gel She can take either or , she needs to pick regarding ibuprofen or Meloxicam

## 2014-04-04 NOTE — Telephone Encounter (Signed)
Call placed to patient and patient made aware per VM.  

## 2014-04-04 NOTE — Telephone Encounter (Signed)
Returned call to patient.   Patient reports that she has been seeing pain management, but has only been given Voltaren Gel. Reports that MD does not think she is a good candidate for pain management.   Requested to have PCP take over prescribing Voltaren Gel so that she does not have to travel back to [place] so often.   Also requested to have PCP prescribe 2-3 tubes of Voltaren Gel so that she can get it cheaper.   Patient also reports that her pharmacy noted interaction between Ibuprofen and Meloxicam. States that she has quit taking Meloxicam, but continues the Ibuprofen for pain.   MD please advise.

## 2014-04-04 NOTE — Telephone Encounter (Signed)
Prescription sent to pharmacy.   Call placed to patient. [place].   Patient wants to continue IBU.

## 2014-04-16 ENCOUNTER — Other Ambulatory Visit: Payer: Self-pay | Admitting: Adult Health

## 2014-05-14 ENCOUNTER — Telehealth: Payer: Self-pay | Admitting: Family Medicine

## 2014-05-14 NOTE — Telephone Encounter (Signed)
Patient is calling to talk with you regarding one of her prescriptions not being covered by her insurance and her insurance company needing more information to get it covered  [phone]

## 2014-05-14 NOTE — Telephone Encounter (Signed)
Received PA determination.   PA approved through 05/15/2015.  Ref# [id number]

## 2014-05-14 NOTE — Telephone Encounter (Signed)
Call placed to patient.   States that PA is required for Voltaren.   PA submitted.   Dx: M17.9- OA of knee

## 2014-05-21 ENCOUNTER — Encounter: Payer: Medicaid Other | Admitting: Family Medicine

## 2014-05-26 ENCOUNTER — Encounter: Payer: Self-pay | Admitting: Family Medicine

## 2014-05-26 ENCOUNTER — Ambulatory Visit (INDEPENDENT_AMBULATORY_CARE_PROVIDER_SITE_OTHER): Payer: Medicare Other | Admitting: Family Medicine

## 2014-05-26 VITALS — BP 136/74 | HR 72 | Temp 97.8°F | Resp 16 | Ht 63.0 in | Wt 285.0 lb

## 2014-05-26 DIAGNOSIS — N651 Disproportion of reconstructed breast: Secondary | ICD-10-CM | POA: Diagnosis not present

## 2014-05-26 DIAGNOSIS — Z720 Tobacco use: Secondary | ICD-10-CM

## 2014-05-26 DIAGNOSIS — Z1239 Encounter for other screening for malignant neoplasm of breast: Secondary | ICD-10-CM

## 2014-05-26 DIAGNOSIS — M216X1 Other acquired deformities of right foot: Secondary | ICD-10-CM | POA: Diagnosis not present

## 2014-05-26 DIAGNOSIS — R7309 Other abnormal glucose: Secondary | ICD-10-CM | POA: Diagnosis not present

## 2014-05-26 DIAGNOSIS — R7303 Prediabetes: Secondary | ICD-10-CM

## 2014-05-26 DIAGNOSIS — Z23 Encounter for immunization: Secondary | ICD-10-CM

## 2014-05-26 DIAGNOSIS — Z0000 Encounter for general adult medical examination without abnormal findings: Secondary | ICD-10-CM | POA: Diagnosis not present

## 2014-05-26 DIAGNOSIS — M546 Pain in thoracic spine: Secondary | ICD-10-CM

## 2014-05-26 DIAGNOSIS — Z79899 Other long term (current) drug therapy: Secondary | ICD-10-CM | POA: Diagnosis not present

## 2014-05-26 DIAGNOSIS — Z1211 Encounter for screening for malignant neoplasm of colon: Secondary | ICD-10-CM

## 2014-05-26 LAB — CBC WITH DIFFERENTIAL/PLATELET
Basophils Absolute: 0 10*3/uL (ref 0.0–0.1)
Basophils Relative: 0 % (ref 0–1)
EOS ABS: 0.1 10*3/uL (ref 0.0–0.7)
EOS PCT: 1 % (ref 0–5)
HEMATOCRIT: 42.6 % (ref 36.0–46.0)
Hemoglobin: 13.9 g/dL (ref 12.0–15.0)
LYMPHS ABS: 1.8 10*3/uL (ref 0.7–4.0)
Lymphocytes Relative: 36 % (ref 12–46)
MCH: 30.1 pg (ref 26.0–34.0)
MCHC: 32.6 g/dL (ref 30.0–36.0)
MCV: 92.2 fL (ref 78.0–100.0)
MONOS PCT: 9 % (ref 3–12)
Monocytes Absolute: 0.5 10*3/uL (ref 0.1–1.0)
NEUTROS PCT: 54 % (ref 43–77)
Neutro Abs: 2.7 10*3/uL (ref 1.7–7.7)
PLATELETS: 277 10*3/uL (ref 150–400)
RBC: 4.62 MIL/uL (ref 3.87–5.11)
RDW: 13.8 % (ref 11.5–15.5)
WBC: 5 10*3/uL (ref 4.0–10.5)

## 2014-05-26 LAB — LIPID PANEL
CHOLESTEROL: 163 mg/dL (ref 0–200)
HDL: 60 mg/dL (ref >39)
LDL Cholesterol: 79 mg/dL (ref 0–99)
Total CHOL/HDL Ratio: 2.7 Ratio
Triglycerides: 119 mg/dL (ref <150)
VLDL: 24 mg/dL (ref 0–40)

## 2014-05-26 LAB — COMPREHENSIVE METABOLIC PANEL
ALK PHOS: 72 U/L (ref 39–117)
ALT: 11 U/L (ref 0–35)
AST: 14 U/L (ref 0–37)
Albumin: 4.1 g/dL (ref 3.5–5.2)
BUN: 13 mg/dL (ref 6–23)
CHLORIDE: 104 meq/L (ref 96–112)
CO2: 27 mEq/L (ref 19–32)
Calcium: 9.4 mg/dL (ref 8.4–10.5)
Creat: 0.66 mg/dL (ref 0.50–1.10)
Glucose, Bld: 92 mg/dL (ref 70–99)
POTASSIUM: 4.8 meq/L (ref 3.5–5.3)
SODIUM: 139 meq/L (ref 135–145)
Total Bilirubin: 0.3 mg/dL (ref 0.2–1.2)
Total Protein: 7.1 g/dL (ref 6.0–8.3)

## 2014-05-26 LAB — TSH: TSH: 0.61 u[IU]/mL (ref 0.350–4.500)

## 2014-05-26 LAB — HEMOGLOBIN A1C
Hgb A1c MFr Bld: 5.5 % (ref <5.7)
MEAN PLASMA GLUCOSE: 111 mg/dL (ref <117)

## 2014-05-26 NOTE — Assessment & Plan Note (Addendum)
Pneumonia vaccine  Counseled on complete cessation

## 2014-05-26 NOTE — Assessment & Plan Note (Signed)
Congential problem, now worsening, would benefit from possible orthotics?? referral to podiatry for evaluation

## 2014-05-26 NOTE — Progress Notes (Signed)
Patient ID: Annette Galvan, female   DOB: Nov 06, 1960, 54 y.o.   MRN: [id number]   Subjective:    Patient ID: Annette Galvan, female    DOB: [birth year], 54 y.o.   MRN: [id number]  Patient presents for CPE- no PAP Pt here for CPE, s/p hysterectomy- No PAP smear   [facility] for colonoscopy  Multiple concerns: She has 1 false breast on right side, she has noticed left side is hanging lower and the assymetry is causing her to have pain in her back and shoulder and she feels twisted. She is overdue for mammogram because she has had pain when they have tried to image her reconstructed breast. 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  States she was bowlegged as a child and had to wear braces for 3 years, her feet would turn out a lot, past few months she has noticed her feet turning out more, feels like she is walking on the edge of her foot which causes pain, also has pain at arches  PMH- Reviewed      Review Of Systems:  GEN- denies fatigue, fever, weight loss,weakness, recent illness HEENT- denies eye drainage, change in vision, nasal discharge, CVS- denies chest pain, palpitations RESP- denies SOB, cough, wheeze ABD- denies N/V, change in stools, abd pain GU- denies dysuria, hematuria, dribbling, incontinence MSK- + joint pain, muscle aches, injury Neuro- denies headache, dizziness, syncope, seizure activity       Objective:    BP 136/74 mmHg  Pulse 72  Temp(Src) 97.8 F (36.6 C) (Oral)  Resp 16  Ht 5\' 3"  (1.6 m)  Wt 285 lb (129.275 kg)  BMI 50.50 kg/m2 GEN- NAD, alert and oriented x3 HEENT- PERRL, EOMI, non injected sclera, pink conjunctiva, MMM, oropharynx clear, +left maxillary sinus tenderness, clear rhinorrhea, TM clear bilat, no effusion, canals clear Breast- aymmetric left breast- ptosis, no nipple inversion,no nipple drainage, no nodules or lumps felt Nodes- no axillary nodes CVS- RRR, no  murmur RESP-CTAB ABD-NABS,soft,NT,ND EXT- No edema MSK- mild inversion of right foot, when in neutral position pain elevated, TTP metarsal arches Pulses- Radial, DP- 2+        Assessment & Plan:      Problem List Items Addressed This Visit    None      Note: This dictation was prepared with Dragon dictation along with smaller phrase technology. Any transcriptional errors that result from this process are unintentional.

## 2014-05-26 NOTE — Patient Instructions (Addendum)
We will call with lab results Referral plastic surgeon  Referral to foot doctor Colonoscopy  Mammogram on left side  Pneumonia shot  F/U 6 months

## 2014-05-26 NOTE — Assessment & Plan Note (Signed)
Referral to be placed

## 2014-05-26 NOTE — Assessment & Plan Note (Signed)
This is causing psychological as well as physical impairment She has some thoracic back pain due to the size different, Will see if we can have her evaluated by a plastic surgeon

## 2014-05-27 ENCOUNTER — Encounter: Payer: Self-pay | Admitting: *Deleted

## 2014-05-27 ENCOUNTER — Encounter (INDEPENDENT_AMBULATORY_CARE_PROVIDER_SITE_OTHER): Payer: Self-pay | Admitting: *Deleted

## 2014-05-28 ENCOUNTER — Telehealth: Payer: Self-pay | Admitting: Family Medicine

## 2014-05-28 NOTE — Telephone Encounter (Signed)
Patient returned call and requested lab results for cancer testing.   Advised that normal labs were obtained.   MD please advise.

## 2014-05-28 NOTE — Telephone Encounter (Signed)
Her labs are normal , she needs to get Mammogram and her colonoscopy which have scheduled, no other blood test needed

## 2014-05-28 NOTE — Telephone Encounter (Signed)
Call placed to patient and patient made aware.  

## 2014-05-28 NOTE — Telephone Encounter (Signed)
[phone] Would like lab results if in

## 2014-05-28 NOTE — Telephone Encounter (Signed)
Call placed to patient. LMTRC.  

## 2014-05-29 ENCOUNTER — Encounter: Payer: Self-pay | Admitting: *Deleted

## 2014-05-29 ENCOUNTER — Ambulatory Visit (HOSPITAL_COMMUNITY): Payer: Self-pay | Admitting: Psychiatry

## 2014-05-29 NOTE — Telephone Encounter (Signed)
Patient returned call and made aware.

## 2014-05-29 NOTE — Telephone Encounter (Signed)
Call placed to patient. LMTRC.  

## 2014-05-30 ENCOUNTER — Ambulatory Visit (INDEPENDENT_AMBULATORY_CARE_PROVIDER_SITE_OTHER): Payer: 59 | Admitting: Psychiatry

## 2014-05-30 ENCOUNTER — Encounter (HOSPITAL_COMMUNITY): Payer: Self-pay | Admitting: Psychiatry

## 2014-05-30 VITALS — BP 157/91 | HR 70 | Ht 63.0 in | Wt 283.0 lb

## 2014-05-30 DIAGNOSIS — F4310 Post-traumatic stress disorder, unspecified: Secondary | ICD-10-CM

## 2014-05-30 MED ORDER — ALPRAZOLAM 1 MG PO TABS: 1.0000 mg | ORAL_TABLET | Freq: Four times a day (QID) | ORAL | Status: DC | PRN

## 2014-05-30 MED ORDER — LAMOTRIGINE 100 MG PO TABS: 100.0000 mg | ORAL_TABLET | Freq: Two times a day (BID) | ORAL | Status: DC

## 2014-05-30 MED ORDER — TRAZODONE HCL 150 MG PO TABS: 300.0000 mg | ORAL_TABLET | Freq: Every day | ORAL | Status: DC

## 2014-05-30 MED ORDER — GABAPENTIN 400 MG PO CAPS: 400.0000 mg | ORAL_CAPSULE | Freq: Three times a day (TID) | ORAL | Status: DC
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
## 2014-06-02 DIAGNOSIS — E119 Type 2 diabetes mellitus without complications: Secondary | ICD-10-CM | POA: Diagnosis not present

## 2014-06-02 DIAGNOSIS — G589 Mononeuropathy, unspecified: Secondary | ICD-10-CM | POA: Diagnosis not present

## 2014-06-02 DIAGNOSIS — M179 Osteoarthritis of knee, unspecified: Secondary | ICD-10-CM | POA: Diagnosis not present

## 2014-06-03 DIAGNOSIS — M179 Osteoarthritis of knee, unspecified: Secondary | ICD-10-CM | POA: Diagnosis not present

## 2014-06-03 DIAGNOSIS — E119 Type 2 diabetes mellitus without complications: Secondary | ICD-10-CM | POA: Diagnosis not present

## 2014-06-03 DIAGNOSIS — G589 Mononeuropathy, unspecified: Secondary | ICD-10-CM | POA: Diagnosis not present

## 2014-06-04 DIAGNOSIS — M179 Osteoarthritis of knee, unspecified: Secondary | ICD-10-CM | POA: Diagnosis not present

## 2014-06-04 DIAGNOSIS — E119 Type 2 diabetes mellitus without complications: Secondary | ICD-10-CM | POA: Diagnosis not present

## 2014-06-04 DIAGNOSIS — G589 Mononeuropathy, unspecified: Secondary | ICD-10-CM | POA: Diagnosis not present

## 2014-06-05 DIAGNOSIS — G589 Mononeuropathy, unspecified: Secondary | ICD-10-CM | POA: Diagnosis not present

## 2014-06-05 DIAGNOSIS — M179 Osteoarthritis of knee, unspecified: Secondary | ICD-10-CM | POA: Diagnosis not present

## 2014-06-05 DIAGNOSIS — E119 Type 2 diabetes mellitus without complications: Secondary | ICD-10-CM | POA: Diagnosis not present

## 2014-06-06 DIAGNOSIS — M179 Osteoarthritis of knee, unspecified: Secondary | ICD-10-CM | POA: Diagnosis not present

## 2014-06-06 DIAGNOSIS — G589 Mononeuropathy, unspecified: Secondary | ICD-10-CM | POA: Diagnosis not present

## 2014-06-06 DIAGNOSIS — E119 Type 2 diabetes mellitus without complications: Secondary | ICD-10-CM | POA: Diagnosis not present

## 2014-06-07 DIAGNOSIS — M179 Osteoarthritis of knee, unspecified: Secondary | ICD-10-CM | POA: Diagnosis not present

## 2014-06-07 DIAGNOSIS — E119 Type 2 diabetes mellitus without complications: Secondary | ICD-10-CM | POA: Diagnosis not present

## 2014-06-07 DIAGNOSIS — G589 Mononeuropathy, unspecified: Secondary | ICD-10-CM | POA: Diagnosis not present

## 2014-06-08 DIAGNOSIS — E119 Type 2 diabetes mellitus without complications: Secondary | ICD-10-CM | POA: Diagnosis not present

## 2014-06-08 DIAGNOSIS — G589 Mononeuropathy, unspecified: Secondary | ICD-10-CM | POA: Diagnosis not present

## 2014-06-08 DIAGNOSIS — M179 Osteoarthritis of knee, unspecified: Secondary | ICD-10-CM | POA: Diagnosis not present

## 2014-06-09 DIAGNOSIS — G589 Mononeuropathy, unspecified: Secondary | ICD-10-CM | POA: Diagnosis not present

## 2014-06-09 DIAGNOSIS — E119 Type 2 diabetes mellitus without complications: Secondary | ICD-10-CM | POA: Diagnosis not present

## 2014-06-09 DIAGNOSIS — M179 Osteoarthritis of knee, unspecified: Secondary | ICD-10-CM | POA: Diagnosis not present

## 2014-06-10 DIAGNOSIS — M179 Osteoarthritis of knee, unspecified: Secondary | ICD-10-CM | POA: Diagnosis not present

## 2014-06-10 DIAGNOSIS — G589 Mononeuropathy, unspecified: Secondary | ICD-10-CM | POA: Diagnosis not present

## 2014-06-10 DIAGNOSIS — E119 Type 2 diabetes mellitus without complications: Secondary | ICD-10-CM | POA: Diagnosis not present

## 2014-06-11 ENCOUNTER — Other Ambulatory Visit: Payer: Self-pay | Admitting: Obstetrics and Gynecology

## 2014-06-11 ENCOUNTER — Other Ambulatory Visit: Payer: Self-pay | Admitting: Family Medicine

## 2014-06-11 DIAGNOSIS — M179 Osteoarthritis of knee, unspecified: Secondary | ICD-10-CM | POA: Diagnosis not present

## 2014-06-11 DIAGNOSIS — G589 Mononeuropathy, unspecified: Secondary | ICD-10-CM | POA: Diagnosis not present

## 2014-06-11 DIAGNOSIS — E119 Type 2 diabetes mellitus without complications: Secondary | ICD-10-CM | POA: Diagnosis not present

## 2014-06-11 NOTE — Telephone Encounter (Signed)
Refill denied.   Patient should contact office first.

## 2014-06-12 ENCOUNTER — Ambulatory Visit: Payer: Medicaid Other | Admitting: Podiatry

## 2014-06-12 DIAGNOSIS — M179 Osteoarthritis of knee, unspecified: Secondary | ICD-10-CM | POA: Diagnosis not present

## 2014-06-12 DIAGNOSIS — G589 Mononeuropathy, unspecified: Secondary | ICD-10-CM | POA: Diagnosis not present

## 2014-06-12 DIAGNOSIS — E119 Type 2 diabetes mellitus without complications: Secondary | ICD-10-CM | POA: Diagnosis not present

## 2014-06-13 DIAGNOSIS — G589 Mononeuropathy, unspecified: Secondary | ICD-10-CM | POA: Diagnosis not present

## 2014-06-13 DIAGNOSIS — M179 Osteoarthritis of knee, unspecified: Secondary | ICD-10-CM | POA: Diagnosis not present

## 2014-06-13 DIAGNOSIS — E119 Type 2 diabetes mellitus without complications: Secondary | ICD-10-CM | POA: Diagnosis not present

## 2014-06-13 NOTE — Telephone Encounter (Signed)
Pt not available by phone. Left msg for pt to call office for appt.

## 2014-06-14 DIAGNOSIS — M179 Osteoarthritis of knee, unspecified: Secondary | ICD-10-CM | POA: Diagnosis not present

## 2014-06-14 DIAGNOSIS — G589 Mononeuropathy, unspecified: Secondary | ICD-10-CM | POA: Diagnosis not present

## 2014-06-14 DIAGNOSIS — E119 Type 2 diabetes mellitus without complications: Secondary | ICD-10-CM | POA: Diagnosis not present

## 2014-06-15 DIAGNOSIS — E119 Type 2 diabetes mellitus without complications: Secondary | ICD-10-CM | POA: Diagnosis not present

## 2014-06-15 DIAGNOSIS — G589 Mononeuropathy, unspecified: Secondary | ICD-10-CM | POA: Diagnosis not present

## 2014-06-15 DIAGNOSIS — M179 Osteoarthritis of knee, unspecified: Secondary | ICD-10-CM | POA: Diagnosis not present

## 2014-06-16 DIAGNOSIS — E119 Type 2 diabetes mellitus without complications: Secondary | ICD-10-CM | POA: Diagnosis not present

## 2014-06-16 DIAGNOSIS — G589 Mononeuropathy, unspecified: Secondary | ICD-10-CM | POA: Diagnosis not present

## 2014-06-16 DIAGNOSIS — M179 Osteoarthritis of knee, unspecified: Secondary | ICD-10-CM | POA: Diagnosis not present

## 2014-06-16 NOTE — Telephone Encounter (Signed)
Left message on machine to schedule appointment.

## 2014-06-17 ENCOUNTER — Ambulatory Visit (HOSPITAL_COMMUNITY): Payer: Self-pay

## 2014-06-17 DIAGNOSIS — G589 Mononeuropathy, unspecified: Secondary | ICD-10-CM | POA: Diagnosis not present

## 2014-06-17 DIAGNOSIS — E119 Type 2 diabetes mellitus without complications: Secondary | ICD-10-CM | POA: Diagnosis not present

## 2014-06-17 DIAGNOSIS — M179 Osteoarthritis of knee, unspecified: Secondary | ICD-10-CM | POA: Diagnosis not present

## 2014-06-18 DIAGNOSIS — G589 Mononeuropathy, unspecified: Secondary | ICD-10-CM | POA: Diagnosis not present

## 2014-06-18 DIAGNOSIS — E119 Type 2 diabetes mellitus without complications: Secondary | ICD-10-CM | POA: Diagnosis not present

## 2014-06-18 DIAGNOSIS — M179 Osteoarthritis of knee, unspecified: Secondary | ICD-10-CM | POA: Diagnosis not present

## 2014-06-19 DIAGNOSIS — G589 Mononeuropathy, unspecified: Secondary | ICD-10-CM | POA: Diagnosis not present

## 2014-06-19 DIAGNOSIS — M179 Osteoarthritis of knee, unspecified: Secondary | ICD-10-CM | POA: Diagnosis not present

## 2014-06-19 DIAGNOSIS — E119 Type 2 diabetes mellitus without complications: Secondary | ICD-10-CM | POA: Diagnosis not present

## 2014-06-20 DIAGNOSIS — M179 Osteoarthritis of knee, unspecified: Secondary | ICD-10-CM | POA: Diagnosis not present

## 2014-06-20 DIAGNOSIS — G589 Mononeuropathy, unspecified: Secondary | ICD-10-CM | POA: Diagnosis not present

## 2014-06-20 DIAGNOSIS — E119 Type 2 diabetes mellitus without complications: Secondary | ICD-10-CM | POA: Diagnosis not present

## 2014-06-21 DIAGNOSIS — M179 Osteoarthritis of knee, unspecified: Secondary | ICD-10-CM | POA: Diagnosis not present

## 2014-06-21 DIAGNOSIS — G589 Mononeuropathy, unspecified: Secondary | ICD-10-CM | POA: Diagnosis not present

## 2014-06-21 DIAGNOSIS — E119 Type 2 diabetes mellitus without complications: Secondary | ICD-10-CM | POA: Diagnosis not present

## 2014-06-22 DIAGNOSIS — M179 Osteoarthritis of knee, unspecified: Secondary | ICD-10-CM | POA: Diagnosis not present

## 2014-06-22 DIAGNOSIS — E119 Type 2 diabetes mellitus without complications: Secondary | ICD-10-CM | POA: Diagnosis not present

## 2014-06-22 DIAGNOSIS — G589 Mononeuropathy, unspecified: Secondary | ICD-10-CM | POA: Diagnosis not present

## 2014-06-23 DIAGNOSIS — M179 Osteoarthritis of knee, unspecified: Secondary | ICD-10-CM | POA: Diagnosis not present

## 2014-06-23 DIAGNOSIS — M25571 Pain in right ankle and joints of right foot: Secondary | ICD-10-CM | POA: Diagnosis not present

## 2014-06-23 DIAGNOSIS — G589 Mononeuropathy, unspecified: Secondary | ICD-10-CM | POA: Diagnosis not present

## 2014-06-23 DIAGNOSIS — E119 Type 2 diabetes mellitus without complications: Secondary | ICD-10-CM | POA: Diagnosis not present

## 2014-06-23 DIAGNOSIS — S99911A Unspecified injury of right ankle, initial encounter: Secondary | ICD-10-CM | POA: Diagnosis not present

## 2014-06-24 DIAGNOSIS — G589 Mononeuropathy, unspecified: Secondary | ICD-10-CM | POA: Diagnosis not present

## 2014-06-24 DIAGNOSIS — E119 Type 2 diabetes mellitus without complications: Secondary | ICD-10-CM | POA: Diagnosis not present

## 2014-06-24 DIAGNOSIS — M179 Osteoarthritis of knee, unspecified: Secondary | ICD-10-CM | POA: Diagnosis not present

## 2014-06-25 DIAGNOSIS — G589 Mononeuropathy, unspecified: Secondary | ICD-10-CM | POA: Diagnosis not present

## 2014-06-25 DIAGNOSIS — M25371 Other instability, right ankle: Secondary | ICD-10-CM | POA: Diagnosis not present

## 2014-06-25 DIAGNOSIS — M25571 Pain in right ankle and joints of right foot: Secondary | ICD-10-CM | POA: Diagnosis not present

## 2014-06-25 DIAGNOSIS — E119 Type 2 diabetes mellitus without complications: Secondary | ICD-10-CM | POA: Diagnosis not present

## 2014-06-25 DIAGNOSIS — M179 Osteoarthritis of knee, unspecified: Secondary | ICD-10-CM | POA: Diagnosis not present

## 2014-06-26 DIAGNOSIS — E119 Type 2 diabetes mellitus without complications: Secondary | ICD-10-CM | POA: Diagnosis not present

## 2014-06-26 DIAGNOSIS — M179 Osteoarthritis of knee, unspecified: Secondary | ICD-10-CM | POA: Diagnosis not present

## 2014-06-26 DIAGNOSIS — G589 Mononeuropathy, unspecified: Secondary | ICD-10-CM | POA: Diagnosis not present

## 2014-06-27 DIAGNOSIS — E119 Type 2 diabetes mellitus without complications: Secondary | ICD-10-CM | POA: Diagnosis not present

## 2014-06-27 DIAGNOSIS — G589 Mononeuropathy, unspecified: Secondary | ICD-10-CM | POA: Diagnosis not present

## 2014-06-27 DIAGNOSIS — M179 Osteoarthritis of knee, unspecified: Secondary | ICD-10-CM | POA: Diagnosis not present

## 2014-06-28 DIAGNOSIS — G589 Mononeuropathy, unspecified: Secondary | ICD-10-CM | POA: Diagnosis not present

## 2014-06-28 DIAGNOSIS — M179 Osteoarthritis of knee, unspecified: Secondary | ICD-10-CM | POA: Diagnosis not present

## 2014-06-28 DIAGNOSIS — E119 Type 2 diabetes mellitus without complications: Secondary | ICD-10-CM | POA: Diagnosis not present

## 2014-06-29 DIAGNOSIS — G589 Mononeuropathy, unspecified: Secondary | ICD-10-CM | POA: Diagnosis not present

## 2014-06-29 DIAGNOSIS — E119 Type 2 diabetes mellitus without complications: Secondary | ICD-10-CM | POA: Diagnosis not present

## 2014-06-29 DIAGNOSIS — M179 Osteoarthritis of knee, unspecified: Secondary | ICD-10-CM | POA: Diagnosis not present

## 2014-06-30 ENCOUNTER — Telehealth: Payer: Self-pay

## 2014-06-30 DIAGNOSIS — G589 Mononeuropathy, unspecified: Secondary | ICD-10-CM | POA: Diagnosis not present

## 2014-06-30 DIAGNOSIS — M179 Osteoarthritis of knee, unspecified: Secondary | ICD-10-CM | POA: Diagnosis not present

## 2014-06-30 DIAGNOSIS — E119 Type 2 diabetes mellitus without complications: Secondary | ICD-10-CM | POA: Diagnosis not present

## 2014-06-30 NOTE — Telephone Encounter (Signed)
Pt is aware she needs OV but has to check schedule and will call back to schedule.

## 2014-06-30 NOTE — Telephone Encounter (Signed)
Pt was referred by dr. Buelah Manis for screening colonoscopy. She was scheduled in 12/2012 with Dr. Gala Romney and she ended up eating when she was not supposed to and cancelled.  The previous notes indicate she was to have phenergan due to polypharmacy.  Please advise if pt needs OV prior to triaging or just triage.

## 2014-06-30 NOTE — Telephone Encounter (Signed)
Office visit

## 2014-07-01 DIAGNOSIS — E119 Type 2 diabetes mellitus without complications: Secondary | ICD-10-CM | POA: Diagnosis not present

## 2014-07-01 DIAGNOSIS — G589 Mononeuropathy, unspecified: Secondary | ICD-10-CM | POA: Diagnosis not present

## 2014-07-01 DIAGNOSIS — M179 Osteoarthritis of knee, unspecified: Secondary | ICD-10-CM | POA: Diagnosis not present

## 2014-07-02 DIAGNOSIS — E119 Type 2 diabetes mellitus without complications: Secondary | ICD-10-CM | POA: Diagnosis not present

## 2014-07-02 DIAGNOSIS — G589 Mononeuropathy, unspecified: Secondary | ICD-10-CM | POA: Diagnosis not present

## 2014-07-02 DIAGNOSIS — M179 Osteoarthritis of knee, unspecified: Secondary | ICD-10-CM | POA: Diagnosis not present

## 2014-07-03 DIAGNOSIS — E119 Type 2 diabetes mellitus without complications: Secondary | ICD-10-CM | POA: Diagnosis not present

## 2014-07-03 DIAGNOSIS — G589 Mononeuropathy, unspecified: Secondary | ICD-10-CM | POA: Diagnosis not present

## 2014-07-03 DIAGNOSIS — M179 Osteoarthritis of knee, unspecified: Secondary | ICD-10-CM | POA: Diagnosis not present

## 2014-07-04 DIAGNOSIS — E119 Type 2 diabetes mellitus without complications: Secondary | ICD-10-CM | POA: Diagnosis not present

## 2014-07-04 DIAGNOSIS — M179 Osteoarthritis of knee, unspecified: Secondary | ICD-10-CM | POA: Diagnosis not present

## 2014-07-04 DIAGNOSIS — G589 Mononeuropathy, unspecified: Secondary | ICD-10-CM | POA: Diagnosis not present

## 2014-07-05 DIAGNOSIS — E119 Type 2 diabetes mellitus without complications: Secondary | ICD-10-CM | POA: Diagnosis not present

## 2014-07-05 DIAGNOSIS — M179 Osteoarthritis of knee, unspecified: Secondary | ICD-10-CM | POA: Diagnosis not present

## 2014-07-05 DIAGNOSIS — G589 Mononeuropathy, unspecified: Secondary | ICD-10-CM | POA: Diagnosis not present

## 2014-07-06 DIAGNOSIS — G589 Mononeuropathy, unspecified: Secondary | ICD-10-CM | POA: Diagnosis not present

## 2014-07-06 DIAGNOSIS — M179 Osteoarthritis of knee, unspecified: Secondary | ICD-10-CM | POA: Diagnosis not present

## 2014-07-06 DIAGNOSIS — E119 Type 2 diabetes mellitus without complications: Secondary | ICD-10-CM | POA: Diagnosis not present

## 2014-07-07 DIAGNOSIS — M179 Osteoarthritis of knee, unspecified: Secondary | ICD-10-CM | POA: Diagnosis not present

## 2014-07-07 DIAGNOSIS — E119 Type 2 diabetes mellitus without complications: Secondary | ICD-10-CM | POA: Diagnosis not present

## 2014-07-07 DIAGNOSIS — G589 Mononeuropathy, unspecified: Secondary | ICD-10-CM | POA: Diagnosis not present

## 2014-07-08 DIAGNOSIS — E119 Type 2 diabetes mellitus without complications: Secondary | ICD-10-CM | POA: Diagnosis not present

## 2014-07-08 DIAGNOSIS — G589 Mononeuropathy, unspecified: Secondary | ICD-10-CM | POA: Diagnosis not present

## 2014-07-08 DIAGNOSIS — M179 Osteoarthritis of knee, unspecified: Secondary | ICD-10-CM | POA: Diagnosis not present

## 2014-07-09 ENCOUNTER — Telehealth: Payer: Self-pay | Admitting: Family Medicine

## 2014-07-09 DIAGNOSIS — E119 Type 2 diabetes mellitus without complications: Secondary | ICD-10-CM | POA: Diagnosis not present

## 2014-07-09 DIAGNOSIS — G589 Mononeuropathy, unspecified: Secondary | ICD-10-CM | POA: Diagnosis not present

## 2014-07-09 DIAGNOSIS — M179 Osteoarthritis of knee, unspecified: Secondary | ICD-10-CM | POA: Diagnosis not present

## 2014-07-09 NOTE — Telephone Encounter (Signed)
She can use a powder like [employer]olds [employer]s to help with moisture and friction If any infection schedule appt

## 2014-07-09 NOTE — Telephone Encounter (Signed)
Call placed to patient and patient made aware.  

## 2014-07-09 NOTE — Telephone Encounter (Signed)
Call placed to patient to inquire.   Reports that patient noted that she had gained weight, so she began exercising and walking more in effort to loose extra weight. States that her inner thighs have been rubbing together and she now has irritation and blister like areas. Reports some fluid filled raised areas, and red irritation with blood like drainage.   Reports that she can't afford OTC treatment. Advised to try to limit contact of inner thighs and to keep areas clean and dry.   MD please advise.

## 2014-07-09 NOTE — Telephone Encounter (Signed)
[facility]   Patient has gained a lot of weight and her legs have rubbed together so much she has blisters, would like to know if you can call something in if possible  [phone]

## 2014-07-10 ENCOUNTER — Other Ambulatory Visit: Payer: Self-pay | Admitting: Family Medicine

## 2014-07-10 DIAGNOSIS — M179 Osteoarthritis of knee, unspecified: Secondary | ICD-10-CM | POA: Diagnosis not present

## 2014-07-10 DIAGNOSIS — E119 Type 2 diabetes mellitus without complications: Secondary | ICD-10-CM | POA: Diagnosis not present

## 2014-07-10 DIAGNOSIS — G589 Mononeuropathy, unspecified: Secondary | ICD-10-CM | POA: Diagnosis not present

## 2014-07-10 NOTE — Telephone Encounter (Signed)
Medication refilled per protocol. 

## 2014-07-11 DIAGNOSIS — E119 Type 2 diabetes mellitus without complications: Secondary | ICD-10-CM | POA: Diagnosis not present

## 2014-07-11 DIAGNOSIS — G589 Mononeuropathy, unspecified: Secondary | ICD-10-CM | POA: Diagnosis not present

## 2014-07-11 DIAGNOSIS — M179 Osteoarthritis of knee, unspecified: Secondary | ICD-10-CM | POA: Diagnosis not present

## 2014-07-12 DIAGNOSIS — G589 Mononeuropathy, unspecified: Secondary | ICD-10-CM | POA: Diagnosis not present

## 2014-07-12 DIAGNOSIS — E119 Type 2 diabetes mellitus without complications: Secondary | ICD-10-CM | POA: Diagnosis not present

## 2014-07-12 DIAGNOSIS — M179 Osteoarthritis of knee, unspecified: Secondary | ICD-10-CM | POA: Diagnosis not present

## 2014-07-13 DIAGNOSIS — G589 Mononeuropathy, unspecified: Secondary | ICD-10-CM | POA: Diagnosis not present

## 2014-07-13 DIAGNOSIS — E119 Type 2 diabetes mellitus without complications: Secondary | ICD-10-CM | POA: Diagnosis not present

## 2014-07-13 DIAGNOSIS — M179 Osteoarthritis of knee, unspecified: Secondary | ICD-10-CM | POA: Diagnosis not present

## 2014-07-14 DIAGNOSIS — E119 Type 2 diabetes mellitus without complications: Secondary | ICD-10-CM | POA: Diagnosis not present

## 2014-07-14 DIAGNOSIS — M179 Osteoarthritis of knee, unspecified: Secondary | ICD-10-CM | POA: Diagnosis not present

## 2014-07-14 DIAGNOSIS — G589 Mononeuropathy, unspecified: Secondary | ICD-10-CM | POA: Diagnosis not present

## 2014-07-15 ENCOUNTER — Encounter: Payer: Self-pay | Admitting: *Deleted

## 2014-07-15 DIAGNOSIS — M179 Osteoarthritis of knee, unspecified: Secondary | ICD-10-CM | POA: Diagnosis not present

## 2014-07-15 DIAGNOSIS — G589 Mononeuropathy, unspecified: Secondary | ICD-10-CM | POA: Diagnosis not present

## 2014-07-15 DIAGNOSIS — E119 Type 2 diabetes mellitus without complications: Secondary | ICD-10-CM | POA: Diagnosis not present

## 2014-07-16 DIAGNOSIS — G589 Mononeuropathy, unspecified: Secondary | ICD-10-CM | POA: Diagnosis not present

## 2014-07-16 DIAGNOSIS — M179 Osteoarthritis of knee, unspecified: Secondary | ICD-10-CM | POA: Diagnosis not present

## 2014-07-16 DIAGNOSIS — E119 Type 2 diabetes mellitus without complications: Secondary | ICD-10-CM | POA: Diagnosis not present

## 2014-07-17 ENCOUNTER — Encounter: Payer: Self-pay | Admitting: Nurse Practitioner

## 2014-07-17 ENCOUNTER — Ambulatory Visit (INDEPENDENT_AMBULATORY_CARE_PROVIDER_SITE_OTHER): Payer: Medicare Other | Admitting: Nurse Practitioner

## 2014-07-17 VITALS — BP 122/79 | HR 72 | Temp 97.0°F | Ht 64.0 in | Wt 285.2 lb

## 2014-07-17 DIAGNOSIS — M179 Osteoarthritis of knee, unspecified: Secondary | ICD-10-CM | POA: Diagnosis not present

## 2014-07-17 DIAGNOSIS — G589 Mononeuropathy, unspecified: Secondary | ICD-10-CM | POA: Diagnosis not present

## 2014-07-17 DIAGNOSIS — E119 Type 2 diabetes mellitus without complications: Secondary | ICD-10-CM | POA: Diagnosis not present

## 2014-07-17 DIAGNOSIS — Z1211 Encounter for screening for malignant neoplasm of colon: Secondary | ICD-10-CM | POA: Diagnosis not present

## 2014-07-17 NOTE — Assessment & Plan Note (Addendum)
54 year old female presents to reschedule her colonoscopy which she had to cancel last time because she had inadvertently eating after starting her bowel prep. Patient was seen in office today rather than from triage because of a history of drug abuse which required further evaluation and consideration. She is essentially asymptomatic from a GI standpoint and is completing his exam for routine screening. Has not used cocaine in over 3 years, does not regularly drink, no polypharmacy. Currently no red flag/warning signs or symptoms. We'll plan for screening colonoscopy in the endoscopy suite with conscious sedation and preprocedure Phenergan.  Proceed with TCS with Dr. Gala Romney with pre-procedure Phenergan in near future: the risks, benefits, and alternatives have been discussed with the patient in detail. The patient states understanding and desires to proceed.

## 2014-07-17 NOTE — Progress Notes (Signed)
Referring Provider: Alycia Rossetti, MD Primary Care Physician:  Vic Blackbird, MD Primary GI:  Dr. Gala Romney  Chief Complaint  Patient presents with  . set up TCS    HPI:   54 year old female presents for routine screening colonoscopy. Was previous referred by Dr. Lorrene Reid in 2014, however the patient had a cancer procedure that time and has not had a chance to reschedule. She presents today for that rescheduling.  Today she states she has never had a colonoscopy before. Last time she inadvertantly ate after taking the prep. Denies abdominal pain, n/V, change in appetite or bowel habits, hematochezia, melena, unexplained weight loss, fever, and chills. Denies any other upper or GI symtptoms at this time.  Past Medical History  Diagnosis Date  . Bipolar 1 disorder   . Back pain, chronic   . Leg pain   . HSV-2 (herpes simplex virus 2) infection 2013  . Bilateral chronic knee pain   . Fibromyalgia   . Hx of trichomoniasis   . Arthritis     needs 2 knee replacements  . Bulging disc   . UTI (lower urinary tract infection) 10/12/2012  . GERD (gastroesophageal reflux disease)   . Vaginal odor 12/28/2012    Had discharge +clue  . Yeast infection 02/04/2013  . Vaginal discharge 06/26/2013  . Trichimoniasis 06/26/2013  . Itching 06/26/2013  . Vaginal irritation 12/05/2013    Past Surgical History  Procedure Laterality Date  . Abdominal hysterectomy    . Breast enhancement surgery    . Tubal ligation      Current Outpatient Prescriptions  Medication Sig Dispense Refill  . ALPRAZolam (XANAX) 1 MG tablet Take 1 tablet (1 mg total) by mouth 4 (four) times daily as needed for anxiety. 120 tablet 2  . cetirizine (ZYRTEC) 10 MG tablet Take 10 mg by mouth daily.    . diclofenac sodium (VOLTAREN) 1 % GEL Apply 2 g topically 4 (four) times daily. 300 g 11  . fluticasone (FLONASE) Annette MCG/ACT nasal spray INHALE 2 SPRAYS IN EACH NOSTRIL ONCE DAILY AS DIRECTED. 16 g 3  . gabapentin (NEURONTIN)  400 MG capsule Take 1 capsule (400 mg total) by mouth 3 (three) times daily. 90 capsule 2  . ibuprofen (ADVIL,MOTRIN) 800 MG tablet TAKE ONE TABLET BY MOUTH TWICE DAILY AS NEEDED PAIN. 60 tablet 3  . lamoTRIgine (LAMICTAL) 100 MG tablet Take 1 tablet (100 mg total) by mouth 2 (two) times daily. 60 tablet 2  . omeprazole (PRILOSEC) 20 MG capsule TAKE (1) CAPSULE BY MOUTH ONCE DAILY FOR ACID REFLUX. 30 capsule 3  . traZODone (DESYREL) 150 MG tablet Take 2 tablets (300 mg total) by mouth at bedtime. 60 tablet 2  . levocetirizine (XYZAL) 5 MG tablet TAKE 1 TABLET BY MOUTH AT BEDTIME. (Patient not taking: Reported on 07/17/2014) 30 tablet 3   No current facility-administered medications for this visit.    Allergies as of 07/17/2014 - Review Complete 07/17/2014  Allergen Reaction Noted  . Latex Swelling 10/17/2012  . Ampicillin Hives and Nausea And Vomiting     Family History  Problem Relation Age of Onset  . Hypertension Mother   . Diabetes Mother   . Depression Mother   . Hyperlipidemia Mother   . Fibromyalgia Mother   . Arthritis Mother   . Cancer Father     bone  . Hyperlipidemia Father   . Hypertension Father   . Bipolar disorder Father   . Depression Sister   . Hyperlipidemia  Sister   . Fibromyalgia Sister   . Fibromyalgia Sister   . Colon cancer Neg Hx   . Bipolar disorder Other   . Drug abuse Other   . Alcohol abuse Other   . Hypertension Brother   . Other Brother     shingles; back problems  . Diabetes Paternal Grandmother   . Alzheimer's disease Maternal Grandmother   . Obesity Daughter   . Other Daughter     back problems  . Bipolar disorder Daughter   . Depression Daughter   . Other Daughter     on pain meds    History   Social History  . Marital Status: Widowed    Spouse Name: N/A  . Number of Children: N/A  . Years of Education: N/A   Social History Main Topics  . Smoking status: Light Tobacco Smoker -- 0.25 packs/day    Types: Cigarettes  .  Smokeless tobacco: Never Used  . Alcohol Use: Yes     Comment: beer occasionally  . Drug Use: No     Comment: cocaine, clean for 1.5 years  . Sexual Activity: Not Currently    Birth Control/ Protection: Surgical   Other Topics Concern  . None   Social History Narrative    Review of Systems: Gen: Denies fever, chills, anorexia. Denies fatigue, weakness, weight loss.  CV: Denies chest pain, palpitations, syncope, peripheral edema, and claudication. Resp: Denies dyspnea at rest, cough, wheezing, coughing up blood, and pleurisy. GI: Denies vomiting blood, jaundice, and fecal incontinence.   Denies dysphagia or odynophagia. Derm: Denies rash, itching, dry skin Psych: Denies depression, anxiety, memory loss, confusion. No homicidal or suicidal ideation.  Heme: Denies bruising, bleeding, and enlarged lymph nodes.  Physical Exam: BP 122/79 mmHg  Pulse 72  Temp(Src) 97 F (36.1 C)  Ht 5\' 4"  (1.626 m)  Wt 285 lb 3.2 oz (129.366 kg)  BMI 48.93 kg/m2 General:   Alert and oriented. No distress noted. Pleasant and cooperative.  Head:  Normocephalic and atraumatic. Eyes:  Conjuctiva clear without scleral icterus. Mouth:  Oral mucosa pink and moist. No lesions. No OP edema. Neck:  Supple, without mass or thyromegaly. Lungs:  Clear to auscultation bilaterally. No wheezes, rales, or rhonchi. No distress.  Heart:  S1, S2 present without murmurs, rubs, or gallops. Regular rate and rhythm. Abdomen:  +BS, soft, rounded, non-tender and non-distended. No rebound or guarding. No HSM or masses noted. Msk:  Symmetrical without gross deformities. Normal posture. Extremities:  Without edema. Neurologic:  Alert and  oriented x4;  grossly normal neurologically. Skin:  Intact without significant lesions or rashes. Cervical Nodes:  No significant cervical adenopathy. Psych:  Alert and cooperative. Normal mood and affect.    07/17/2014 9:41 AM

## 2014-07-17 NOTE — Patient Instructions (Signed)
1. We will schedule colonoscopy procedure for you. 2. Further recommendations to be based on the results your procedure.

## 2014-07-17 NOTE — Progress Notes (Signed)
cc'ed to pcp °

## 2014-07-18 DIAGNOSIS — M179 Osteoarthritis of knee, unspecified: Secondary | ICD-10-CM | POA: Diagnosis not present

## 2014-07-18 DIAGNOSIS — E119 Type 2 diabetes mellitus without complications: Secondary | ICD-10-CM | POA: Diagnosis not present

## 2014-07-18 DIAGNOSIS — G589 Mononeuropathy, unspecified: Secondary | ICD-10-CM | POA: Diagnosis not present

## 2014-07-19 DIAGNOSIS — G589 Mononeuropathy, unspecified: Secondary | ICD-10-CM | POA: Diagnosis not present

## 2014-07-19 DIAGNOSIS — E119 Type 2 diabetes mellitus without complications: Secondary | ICD-10-CM | POA: Diagnosis not present

## 2014-07-19 DIAGNOSIS — M179 Osteoarthritis of knee, unspecified: Secondary | ICD-10-CM | POA: Diagnosis not present

## 2014-07-20 DIAGNOSIS — E119 Type 2 diabetes mellitus without complications: Secondary | ICD-10-CM | POA: Diagnosis not present

## 2014-07-20 DIAGNOSIS — G589 Mononeuropathy, unspecified: Secondary | ICD-10-CM | POA: Diagnosis not present

## 2014-07-20 DIAGNOSIS — M179 Osteoarthritis of knee, unspecified: Secondary | ICD-10-CM | POA: Diagnosis not present

## 2014-07-21 DIAGNOSIS — E119 Type 2 diabetes mellitus without complications: Secondary | ICD-10-CM | POA: Diagnosis not present

## 2014-07-21 DIAGNOSIS — M179 Osteoarthritis of knee, unspecified: Secondary | ICD-10-CM | POA: Diagnosis not present

## 2014-07-21 DIAGNOSIS — G589 Mononeuropathy, unspecified: Secondary | ICD-10-CM | POA: Diagnosis not present

## 2014-07-22 DIAGNOSIS — E119 Type 2 diabetes mellitus without complications: Secondary | ICD-10-CM | POA: Diagnosis not present

## 2014-07-22 DIAGNOSIS — M179 Osteoarthritis of knee, unspecified: Secondary | ICD-10-CM | POA: Diagnosis not present

## 2014-07-22 DIAGNOSIS — G589 Mononeuropathy, unspecified: Secondary | ICD-10-CM | POA: Diagnosis not present

## 2014-07-23 DIAGNOSIS — G589 Mononeuropathy, unspecified: Secondary | ICD-10-CM | POA: Diagnosis not present

## 2014-07-23 DIAGNOSIS — M179 Osteoarthritis of knee, unspecified: Secondary | ICD-10-CM | POA: Diagnosis not present

## 2014-07-23 DIAGNOSIS — E119 Type 2 diabetes mellitus without complications: Secondary | ICD-10-CM | POA: Diagnosis not present

## 2014-07-24 DIAGNOSIS — E119 Type 2 diabetes mellitus without complications: Secondary | ICD-10-CM | POA: Diagnosis not present

## 2014-07-24 DIAGNOSIS — M179 Osteoarthritis of knee, unspecified: Secondary | ICD-10-CM | POA: Diagnosis not present

## 2014-07-24 DIAGNOSIS — G589 Mononeuropathy, unspecified: Secondary | ICD-10-CM | POA: Diagnosis not present

## 2014-07-25 DIAGNOSIS — E119 Type 2 diabetes mellitus without complications: Secondary | ICD-10-CM | POA: Diagnosis not present

## 2014-07-25 DIAGNOSIS — G589 Mononeuropathy, unspecified: Secondary | ICD-10-CM | POA: Diagnosis not present

## 2014-07-25 DIAGNOSIS — M179 Osteoarthritis of knee, unspecified: Secondary | ICD-10-CM | POA: Diagnosis not present

## 2014-07-26 DIAGNOSIS — G589 Mononeuropathy, unspecified: Secondary | ICD-10-CM | POA: Diagnosis not present

## 2014-07-26 DIAGNOSIS — E119 Type 2 diabetes mellitus without complications: Secondary | ICD-10-CM | POA: Diagnosis not present

## 2014-07-26 DIAGNOSIS — M179 Osteoarthritis of knee, unspecified: Secondary | ICD-10-CM | POA: Diagnosis not present

## 2014-07-27 DIAGNOSIS — M179 Osteoarthritis of knee, unspecified: Secondary | ICD-10-CM | POA: Diagnosis not present

## 2014-07-27 DIAGNOSIS — G589 Mononeuropathy, unspecified: Secondary | ICD-10-CM | POA: Diagnosis not present

## 2014-07-27 DIAGNOSIS — E119 Type 2 diabetes mellitus without complications: Secondary | ICD-10-CM | POA: Diagnosis not present

## 2014-07-28 DIAGNOSIS — G589 Mononeuropathy, unspecified: Secondary | ICD-10-CM | POA: Diagnosis not present

## 2014-07-28 DIAGNOSIS — M179 Osteoarthritis of knee, unspecified: Secondary | ICD-10-CM | POA: Diagnosis not present

## 2014-07-28 DIAGNOSIS — E119 Type 2 diabetes mellitus without complications: Secondary | ICD-10-CM | POA: Diagnosis not present

## 2014-07-29 DIAGNOSIS — M179 Osteoarthritis of knee, unspecified: Secondary | ICD-10-CM | POA: Diagnosis not present

## 2014-07-29 DIAGNOSIS — G589 Mononeuropathy, unspecified: Secondary | ICD-10-CM | POA: Diagnosis not present

## 2014-07-29 DIAGNOSIS — E119 Type 2 diabetes mellitus without complications: Secondary | ICD-10-CM | POA: Diagnosis not present

## 2014-07-30 DIAGNOSIS — M179 Osteoarthritis of knee, unspecified: Secondary | ICD-10-CM | POA: Diagnosis not present

## 2014-07-30 DIAGNOSIS — G589 Mononeuropathy, unspecified: Secondary | ICD-10-CM | POA: Diagnosis not present

## 2014-07-30 DIAGNOSIS — E119 Type 2 diabetes mellitus without complications: Secondary | ICD-10-CM | POA: Diagnosis not present

## 2014-07-31 DIAGNOSIS — G589 Mononeuropathy, unspecified: Secondary | ICD-10-CM | POA: Diagnosis not present

## 2014-07-31 DIAGNOSIS — E119 Type 2 diabetes mellitus without complications: Secondary | ICD-10-CM | POA: Diagnosis not present

## 2014-07-31 DIAGNOSIS — M179 Osteoarthritis of knee, unspecified: Secondary | ICD-10-CM | POA: Diagnosis not present

## 2014-08-01 DIAGNOSIS — G589 Mononeuropathy, unspecified: Secondary | ICD-10-CM | POA: Diagnosis not present

## 2014-08-01 DIAGNOSIS — M179 Osteoarthritis of knee, unspecified: Secondary | ICD-10-CM | POA: Diagnosis not present

## 2014-08-01 DIAGNOSIS — E119 Type 2 diabetes mellitus without complications: Secondary | ICD-10-CM | POA: Diagnosis not present

## 2014-08-02 ENCOUNTER — Other Ambulatory Visit: Payer: Self-pay | Admitting: Family Medicine

## 2014-08-02 DIAGNOSIS — E119 Type 2 diabetes mellitus without complications: Secondary | ICD-10-CM | POA: Diagnosis not present

## 2014-08-02 DIAGNOSIS — G589 Mononeuropathy, unspecified: Secondary | ICD-10-CM | POA: Diagnosis not present

## 2014-08-02 DIAGNOSIS — M179 Osteoarthritis of knee, unspecified: Secondary | ICD-10-CM | POA: Diagnosis not present

## 2014-08-03 DIAGNOSIS — E119 Type 2 diabetes mellitus without complications: Secondary | ICD-10-CM | POA: Diagnosis not present

## 2014-08-03 DIAGNOSIS — M179 Osteoarthritis of knee, unspecified: Secondary | ICD-10-CM | POA: Diagnosis not present

## 2014-08-03 DIAGNOSIS — G589 Mononeuropathy, unspecified: Secondary | ICD-10-CM | POA: Diagnosis not present

## 2014-08-04 DIAGNOSIS — E119 Type 2 diabetes mellitus without complications: Secondary | ICD-10-CM | POA: Diagnosis not present

## 2014-08-04 DIAGNOSIS — M179 Osteoarthritis of knee, unspecified: Secondary | ICD-10-CM | POA: Diagnosis not present

## 2014-08-04 DIAGNOSIS — G589 Mononeuropathy, unspecified: Secondary | ICD-10-CM | POA: Diagnosis not present

## 2014-08-04 NOTE — Telephone Encounter (Signed)
Medication refilled per protocol. 

## 2014-08-05 DIAGNOSIS — E119 Type 2 diabetes mellitus without complications: Secondary | ICD-10-CM | POA: Diagnosis not present

## 2014-08-05 DIAGNOSIS — M179 Osteoarthritis of knee, unspecified: Secondary | ICD-10-CM | POA: Diagnosis not present

## 2014-08-05 DIAGNOSIS — G589 Mononeuropathy, unspecified: Secondary | ICD-10-CM | POA: Diagnosis not present

## 2014-08-06 DIAGNOSIS — M179 Osteoarthritis of knee, unspecified: Secondary | ICD-10-CM | POA: Diagnosis not present

## 2014-08-06 DIAGNOSIS — G589 Mononeuropathy, unspecified: Secondary | ICD-10-CM | POA: Diagnosis not present

## 2014-08-06 DIAGNOSIS — E119 Type 2 diabetes mellitus without complications: Secondary | ICD-10-CM | POA: Diagnosis not present

## 2014-08-07 DIAGNOSIS — G589 Mononeuropathy, unspecified: Secondary | ICD-10-CM | POA: Diagnosis not present

## 2014-08-07 DIAGNOSIS — E119 Type 2 diabetes mellitus without complications: Secondary | ICD-10-CM | POA: Diagnosis not present

## 2014-08-07 DIAGNOSIS — M179 Osteoarthritis of knee, unspecified: Secondary | ICD-10-CM | POA: Diagnosis not present

## 2014-08-08 DIAGNOSIS — M179 Osteoarthritis of knee, unspecified: Secondary | ICD-10-CM | POA: Diagnosis not present

## 2014-08-08 DIAGNOSIS — G589 Mononeuropathy, unspecified: Secondary | ICD-10-CM | POA: Diagnosis not present

## 2014-08-08 DIAGNOSIS — E119 Type 2 diabetes mellitus without complications: Secondary | ICD-10-CM | POA: Diagnosis not present

## 2014-08-09 DIAGNOSIS — M179 Osteoarthritis of knee, unspecified: Secondary | ICD-10-CM | POA: Diagnosis not present

## 2014-08-09 DIAGNOSIS — E119 Type 2 diabetes mellitus without complications: Secondary | ICD-10-CM | POA: Diagnosis not present

## 2014-08-09 DIAGNOSIS — G589 Mononeuropathy, unspecified: Secondary | ICD-10-CM | POA: Diagnosis not present

## 2014-08-10 DIAGNOSIS — G589 Mononeuropathy, unspecified: Secondary | ICD-10-CM | POA: Diagnosis not present

## 2014-08-10 DIAGNOSIS — E119 Type 2 diabetes mellitus without complications: Secondary | ICD-10-CM | POA: Diagnosis not present

## 2014-08-10 DIAGNOSIS — M179 Osteoarthritis of knee, unspecified: Secondary | ICD-10-CM | POA: Diagnosis not present

## 2014-08-11 ENCOUNTER — Telehealth: Payer: Self-pay | Admitting: Family Medicine

## 2014-08-11 DIAGNOSIS — E119 Type 2 diabetes mellitus without complications: Secondary | ICD-10-CM | POA: Diagnosis not present

## 2014-08-11 DIAGNOSIS — M179 Osteoarthritis of knee, unspecified: Secondary | ICD-10-CM | POA: Diagnosis not present

## 2014-08-11 DIAGNOSIS — G589 Mononeuropathy, unspecified: Secondary | ICD-10-CM | POA: Diagnosis not present

## 2014-08-11 NOTE — Telephone Encounter (Signed)
[phone]  Pt is calling because she is wanting to know if she could be going through menopause because she states she just feels "evil"

## 2014-08-11 NOTE — Telephone Encounter (Signed)
Call placed to patient.   Reports that she has increased animosity and anxiety.   States that she did have a total hysterectomy and oophorectomy. Advised that if she had her ovaries removed, she underwent a surgically induced menopause.   Advised that if she was having major moods swings, she needed to F/U with psychologist.   MD to be made aware.

## 2014-08-11 NOTE — Telephone Encounter (Signed)
Agree with above 

## 2014-08-12 DIAGNOSIS — M179 Osteoarthritis of knee, unspecified: Secondary | ICD-10-CM | POA: Diagnosis not present

## 2014-08-12 DIAGNOSIS — G589 Mononeuropathy, unspecified: Secondary | ICD-10-CM | POA: Diagnosis not present

## 2014-08-12 DIAGNOSIS — E119 Type 2 diabetes mellitus without complications: Secondary | ICD-10-CM | POA: Diagnosis not present

## 2014-08-13 DIAGNOSIS — E119 Type 2 diabetes mellitus without complications: Secondary | ICD-10-CM | POA: Diagnosis not present

## 2014-08-13 DIAGNOSIS — M179 Osteoarthritis of knee, unspecified: Secondary | ICD-10-CM | POA: Diagnosis not present

## 2014-08-13 DIAGNOSIS — G589 Mononeuropathy, unspecified: Secondary | ICD-10-CM | POA: Diagnosis not present

## 2014-08-14 DIAGNOSIS — E119 Type 2 diabetes mellitus without complications: Secondary | ICD-10-CM | POA: Diagnosis not present

## 2014-08-14 DIAGNOSIS — G589 Mononeuropathy, unspecified: Secondary | ICD-10-CM | POA: Diagnosis not present

## 2014-08-14 DIAGNOSIS — M179 Osteoarthritis of knee, unspecified: Secondary | ICD-10-CM | POA: Diagnosis not present

## 2014-08-15 DIAGNOSIS — M179 Osteoarthritis of knee, unspecified: Secondary | ICD-10-CM | POA: Diagnosis not present

## 2014-08-15 DIAGNOSIS — G589 Mononeuropathy, unspecified: Secondary | ICD-10-CM | POA: Diagnosis not present

## 2014-08-15 DIAGNOSIS — E119 Type 2 diabetes mellitus without complications: Secondary | ICD-10-CM | POA: Diagnosis not present

## 2014-08-16 DIAGNOSIS — G589 Mononeuropathy, unspecified: Secondary | ICD-10-CM | POA: Diagnosis not present

## 2014-08-16 DIAGNOSIS — M179 Osteoarthritis of knee, unspecified: Secondary | ICD-10-CM | POA: Diagnosis not present

## 2014-08-16 DIAGNOSIS — E119 Type 2 diabetes mellitus without complications: Secondary | ICD-10-CM | POA: Diagnosis not present

## 2014-08-17 DIAGNOSIS — E119 Type 2 diabetes mellitus without complications: Secondary | ICD-10-CM | POA: Diagnosis not present

## 2014-08-17 DIAGNOSIS — G589 Mononeuropathy, unspecified: Secondary | ICD-10-CM | POA: Diagnosis not present

## 2014-08-17 DIAGNOSIS — M179 Osteoarthritis of knee, unspecified: Secondary | ICD-10-CM | POA: Diagnosis not present

## 2014-08-18 DIAGNOSIS — G589 Mononeuropathy, unspecified: Secondary | ICD-10-CM | POA: Diagnosis not present

## 2014-08-18 DIAGNOSIS — M179 Osteoarthritis of knee, unspecified: Secondary | ICD-10-CM | POA: Diagnosis not present

## 2014-08-18 DIAGNOSIS — E119 Type 2 diabetes mellitus without complications: Secondary | ICD-10-CM | POA: Diagnosis not present

## 2014-08-19 DIAGNOSIS — M179 Osteoarthritis of knee, unspecified: Secondary | ICD-10-CM | POA: Diagnosis not present

## 2014-08-19 DIAGNOSIS — G589 Mononeuropathy, unspecified: Secondary | ICD-10-CM | POA: Diagnosis not present

## 2014-08-19 DIAGNOSIS — E119 Type 2 diabetes mellitus without complications: Secondary | ICD-10-CM | POA: Diagnosis not present

## 2014-08-20 DIAGNOSIS — E119 Type 2 diabetes mellitus without complications: Secondary | ICD-10-CM | POA: Diagnosis not present

## 2014-08-20 DIAGNOSIS — G589 Mononeuropathy, unspecified: Secondary | ICD-10-CM | POA: Diagnosis not present

## 2014-08-20 DIAGNOSIS — M179 Osteoarthritis of knee, unspecified: Secondary | ICD-10-CM | POA: Diagnosis not present

## 2014-08-21 DIAGNOSIS — G589 Mononeuropathy, unspecified: Secondary | ICD-10-CM | POA: Diagnosis not present

## 2014-08-21 DIAGNOSIS — M179 Osteoarthritis of knee, unspecified: Secondary | ICD-10-CM | POA: Diagnosis not present

## 2014-08-21 DIAGNOSIS — E119 Type 2 diabetes mellitus without complications: Secondary | ICD-10-CM | POA: Diagnosis not present

## 2014-08-22 DIAGNOSIS — M179 Osteoarthritis of knee, unspecified: Secondary | ICD-10-CM | POA: Diagnosis not present

## 2014-08-22 DIAGNOSIS — G589 Mononeuropathy, unspecified: Secondary | ICD-10-CM | POA: Diagnosis not present

## 2014-08-22 DIAGNOSIS — E119 Type 2 diabetes mellitus without complications: Secondary | ICD-10-CM | POA: Diagnosis not present

## 2014-08-23 DIAGNOSIS — G589 Mononeuropathy, unspecified: Secondary | ICD-10-CM | POA: Diagnosis not present

## 2014-08-23 DIAGNOSIS — M179 Osteoarthritis of knee, unspecified: Secondary | ICD-10-CM | POA: Diagnosis not present

## 2014-08-23 DIAGNOSIS — E119 Type 2 diabetes mellitus without complications: Secondary | ICD-10-CM | POA: Diagnosis not present

## 2014-08-24 DIAGNOSIS — E119 Type 2 diabetes mellitus without complications: Secondary | ICD-10-CM | POA: Diagnosis not present

## 2014-08-24 DIAGNOSIS — G589 Mononeuropathy, unspecified: Secondary | ICD-10-CM | POA: Diagnosis not present

## 2014-08-24 DIAGNOSIS — M179 Osteoarthritis of knee, unspecified: Secondary | ICD-10-CM | POA: Diagnosis not present

## 2014-08-25 DIAGNOSIS — E119 Type 2 diabetes mellitus without complications: Secondary | ICD-10-CM | POA: Diagnosis not present

## 2014-08-25 DIAGNOSIS — G589 Mononeuropathy, unspecified: Secondary | ICD-10-CM | POA: Diagnosis not present

## 2014-08-25 DIAGNOSIS — M179 Osteoarthritis of knee, unspecified: Secondary | ICD-10-CM | POA: Diagnosis not present

## 2014-08-26 DIAGNOSIS — M179 Osteoarthritis of knee, unspecified: Secondary | ICD-10-CM | POA: Diagnosis not present

## 2014-08-26 DIAGNOSIS — G589 Mononeuropathy, unspecified: Secondary | ICD-10-CM | POA: Diagnosis not present

## 2014-08-26 DIAGNOSIS — E119 Type 2 diabetes mellitus without complications: Secondary | ICD-10-CM | POA: Diagnosis not present

## 2014-08-27 ENCOUNTER — Encounter (HOSPITAL_COMMUNITY): Payer: Self-pay | Admitting: Psychiatry

## 2014-08-27 ENCOUNTER — Ambulatory Visit (INDEPENDENT_AMBULATORY_CARE_PROVIDER_SITE_OTHER): Payer: Medicare Other | Admitting: Psychiatry

## 2014-08-27 VITALS — BP 137/72 | HR 76 | Ht 64.0 in | Wt 280.8 lb

## 2014-08-27 DIAGNOSIS — E119 Type 2 diabetes mellitus without complications: Secondary | ICD-10-CM | POA: Diagnosis not present

## 2014-08-27 DIAGNOSIS — M179 Osteoarthritis of knee, unspecified: Secondary | ICD-10-CM | POA: Diagnosis not present

## 2014-08-27 DIAGNOSIS — F4310 Post-traumatic stress disorder, unspecified: Secondary | ICD-10-CM

## 2014-08-27 DIAGNOSIS — G589 Mononeuropathy, unspecified: Secondary | ICD-10-CM | POA: Diagnosis not present

## 2014-08-27 MED ORDER — LAMOTRIGINE 100 MG PO TABS: 100.0000 mg | ORAL_TABLET | Freq: Two times a day (BID) | ORAL | Status: DC

## 2014-08-27 MED ORDER — MIRTAZAPINE 30 MG PO TABS: 30.0000 mg | ORAL_TABLET | Freq: Every day | ORAL | Status: DC

## 2014-08-27 MED ORDER — ALPRAZOLAM 1 MG PO TABS: 1.0000 mg | ORAL_TABLET | Freq: Four times a day (QID) | ORAL | Status: DC | PRN

## 2014-08-27 MED ORDER — GABAPENTIN 400 MG PO CAPS: 400.0000 mg | ORAL_CAPSULE | Freq: Three times a day (TID) | ORAL | Status: DC
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
## 2014-08-28 DIAGNOSIS — E119 Type 2 diabetes mellitus without complications: Secondary | ICD-10-CM | POA: Diagnosis not present

## 2014-08-28 DIAGNOSIS — G589 Mononeuropathy, unspecified: Secondary | ICD-10-CM | POA: Diagnosis not present

## 2014-08-28 DIAGNOSIS — M179 Osteoarthritis of knee, unspecified: Secondary | ICD-10-CM | POA: Diagnosis not present

## 2014-08-29 DIAGNOSIS — E119 Type 2 diabetes mellitus without complications: Secondary | ICD-10-CM | POA: Diagnosis not present

## 2014-08-29 DIAGNOSIS — G589 Mononeuropathy, unspecified: Secondary | ICD-10-CM | POA: Diagnosis not present

## 2014-08-29 DIAGNOSIS — M179 Osteoarthritis of knee, unspecified: Secondary | ICD-10-CM | POA: Diagnosis not present

## 2014-08-30 ENCOUNTER — Emergency Department (HOSPITAL_COMMUNITY)
Admission: EM | Admit: 2014-08-30 | Discharge: 2014-08-30 | Disposition: A | Discharge status: Home / Self Care | Payer: Medicare Other | Attending: Emergency Medicine | Admitting: Emergency Medicine

## 2014-08-30 ENCOUNTER — Emergency Department (HOSPITAL_COMMUNITY): Payer: Medicare Other

## 2014-08-30 ENCOUNTER — Encounter (HOSPITAL_COMMUNITY): Payer: Self-pay | Admitting: Emergency Medicine

## 2014-08-30 DIAGNOSIS — Z8742 Personal history of other diseases of the female genital tract: Secondary | ICD-10-CM | POA: Insufficient documentation

## 2014-08-30 DIAGNOSIS — K219 Gastro-esophageal reflux disease without esophagitis: Secondary | ICD-10-CM | POA: Diagnosis not present

## 2014-08-30 DIAGNOSIS — G8929 Other chronic pain: Secondary | ICD-10-CM | POA: Insufficient documentation

## 2014-08-30 DIAGNOSIS — Z8619 Personal history of other infectious and parasitic diseases: Secondary | ICD-10-CM | POA: Diagnosis not present

## 2014-08-30 DIAGNOSIS — Z87440 Personal history of urinary (tract) infections: Secondary | ICD-10-CM | POA: Insufficient documentation

## 2014-08-30 DIAGNOSIS — Z7951 Long term (current) use of inhaled steroids: Secondary | ICD-10-CM | POA: Diagnosis not present

## 2014-08-30 DIAGNOSIS — Z79899 Other long term (current) drug therapy: Secondary | ICD-10-CM | POA: Diagnosis not present

## 2014-08-30 DIAGNOSIS — Z872 Personal history of diseases of the skin and subcutaneous tissue: Secondary | ICD-10-CM | POA: Diagnosis not present

## 2014-08-30 DIAGNOSIS — J209 Acute bronchitis, unspecified: Secondary | ICD-10-CM

## 2014-08-30 DIAGNOSIS — Z792 Long term (current) use of antibiotics: Secondary | ICD-10-CM | POA: Diagnosis not present

## 2014-08-30 DIAGNOSIS — R062 Wheezing: Secondary | ICD-10-CM | POA: Diagnosis not present

## 2014-08-30 DIAGNOSIS — J069 Acute upper respiratory infection, unspecified: Secondary | ICD-10-CM | POA: Diagnosis not present

## 2014-08-30 DIAGNOSIS — G589 Mononeuropathy, unspecified: Secondary | ICD-10-CM | POA: Diagnosis not present

## 2014-08-30 DIAGNOSIS — E119 Type 2 diabetes mellitus without complications: Secondary | ICD-10-CM | POA: Diagnosis not present

## 2014-08-30 DIAGNOSIS — Z7901 Long term (current) use of anticoagulants: Secondary | ICD-10-CM | POA: Insufficient documentation

## 2014-08-30 DIAGNOSIS — M797 Fibromyalgia: Secondary | ICD-10-CM | POA: Insufficient documentation

## 2014-08-30 DIAGNOSIS — F319 Bipolar disorder, unspecified: Secondary | ICD-10-CM | POA: Diagnosis not present

## 2014-08-30 DIAGNOSIS — J9801 Acute bronchospasm: Secondary | ICD-10-CM | POA: Diagnosis not present

## 2014-08-30 DIAGNOSIS — Z91040 Latex allergy status: Secondary | ICD-10-CM | POA: Diagnosis not present

## 2014-08-30 DIAGNOSIS — M1990 Unspecified osteoarthritis, unspecified site: Secondary | ICD-10-CM | POA: Diagnosis not present

## 2014-08-30 DIAGNOSIS — M179 Osteoarthritis of knee, unspecified: Secondary | ICD-10-CM | POA: Diagnosis not present

## 2014-08-30 DIAGNOSIS — R05 Cough: Secondary | ICD-10-CM | POA: Diagnosis not present

## 2014-08-30 LAB — BASIC METABOLIC PANEL
ANION GAP: 8 (ref 5–15)
BUN: 19 mg/dL (ref 6–20)
CHLORIDE: 105 mmol/L (ref 101–111)
CO2: 25 mmol/L (ref 22–32)
Calcium: 8.7 mg/dL — ABNORMAL LOW (ref 8.9–10.3)
Creatinine, Ser: 0.69 mg/dL (ref 0.44–1.00)
Glucose, Bld: 104 mg/dL — ABNORMAL HIGH (ref 65–99)
POTASSIUM: 3.9 mmol/L (ref 3.5–5.1)
SODIUM: 138 mmol/L (ref 135–145)

## 2014-08-30 LAB — CBC WITH DIFFERENTIAL/PLATELET
Basophils Absolute: 0 10*3/uL (ref 0.0–0.1)
Basophils Relative: 0 % (ref 0–1)
Eosinophils Absolute: 0.1 10*3/uL (ref 0.0–0.7)
Eosinophils Relative: 2 % (ref 0–5)
HCT: 43.6 % (ref 36.0–46.0)
HEMOGLOBIN: 14 g/dL (ref 12.0–15.0)
LYMPHS ABS: 2.3 10*3/uL (ref 0.7–4.0)
Lymphocytes Relative: 40 % (ref 12–46)
MCH: 30.3 pg (ref 26.0–34.0)
MCHC: 32.1 g/dL (ref 30.0–36.0)
MCV: 94.4 fL (ref 78.0–100.0)
MONO ABS: 0.5 10*3/uL (ref 0.1–1.0)
MONOS PCT: 9 % (ref 3–12)
Neutro Abs: 2.9 10*3/uL (ref 1.7–7.7)
Neutrophils Relative %: 49 % (ref 43–77)
PLATELETS: 290 10*3/uL (ref 150–400)
RBC: 4.62 MIL/uL (ref 3.87–5.11)
RDW: 14 % (ref 11.5–15.5)
WBC: 5.9 10*3/uL (ref 4.0–10.5)

## 2014-08-30 LAB — TROPONIN I: Troponin I: <0.03 ng/mL (ref <0.031)

## 2014-08-30 LAB — I-STAT CG4 LACTIC ACID, ED: LACTIC ACID, VENOUS: 1.02 mmol/L (ref 0.5–2.0)

## 2014-08-30 MED ORDER — IPRATROPIUM-ALBUTEROL 0.5-2.5 (3) MG/3ML IN SOLN
3.0000 mL | Freq: Once | RESPIRATORY_TRACT | Status: AC
  Administered 2014-08-30: 3 mL via RESPIRATORY_TRACT
  Filled 2014-08-30: qty 3

## 2014-08-30 MED ORDER — DOXYCYCLINE HYCLATE 100 MG PO TABS: 100.0000 mg | ORAL_TABLET | Freq: Two times a day (BID) | ORAL | Status: DC

## 2014-08-30 MED ORDER — ALBUTEROL SULFATE HFA 108 (90 BASE) MCG/ACT IN AERS
2.0000 | INHALATION_SPRAY | RESPIRATORY_TRACT | Status: AC
  Administered 2014-08-30: 2 via RESPIRATORY_TRACT
  Filled 2014-08-30: qty 6.7

## 2014-08-30 MED ORDER — IPRATROPIUM BROMIDE 0.02 % IN SOLN: 0.5000 mg | Freq: Once | RESPIRATORY_TRACT | Status: DC

## 2014-08-30 MED ORDER — ALBUTEROL SULFATE (2.5 MG/3ML) 0.083% IN NEBU: 2.5000 mg | INHALATION_SOLUTION | Freq: Once | RESPIRATORY_TRACT | Status: DC

## 2014-08-30 MED ORDER — IPRATROPIUM-ALBUTEROL 0.5-2.5 (3) MG/3ML IN SOLN: 3.0000 mL | Freq: Once | RESPIRATORY_TRACT | Status: AC

## 2014-08-30 MED ORDER — PREDNISONE 20 MG PO TABS: 40.0000 mg | ORAL_TABLET | Freq: Every day | ORAL | Status: DC

## 2014-08-30 MED ORDER — BENZONATATE 100 MG PO CAPS: 100.0000 mg | ORAL_CAPSULE | Freq: Three times a day (TID) | ORAL | Status: DC | PRN

## 2014-08-30 MED ORDER — ALBUTEROL SULFATE (2.5 MG/3ML) 0.083% IN NEBU
2.5000 mg | INHALATION_SOLUTION | Freq: Once | RESPIRATORY_TRACT | Status: AC
  Administered 2014-08-30: 2.5 mg via RESPIRATORY_TRACT
  Filled 2014-08-30: qty 3

## 2014-08-30 MED ORDER — PREDNISONE 50 MG PO TABS
60.0000 mg | ORAL_TABLET | Freq: Once | ORAL | Status: AC
  Administered 2014-08-30: 60 mg via ORAL
  Filled 2014-08-30 (×2): qty 1

## 2014-08-30 NOTE — ED Provider Notes (Signed)
CSN: [id number]     Arrival date & time 08/30/14  1141 History   First MD Initiated Contact with Patient 08/30/14 1147     Chief Complaint  Patient presents with  . Cough     HPI Pt was seen at 1155.  Per pt, c/o gradual onset and persistence of constant runny/stuffy nose, sinus congestion, and cough for the past 3 weeks. Has been associated with "wheezing." Describes the cough as productive of "white" sputum.  Pt has not been evaluated by her PMD for her symptoms. Denies fevers, no rash, no CP/SOB, no N/V/D, no abd pain.     Past Medical History  Diagnosis Date  . Bipolar 1 disorder   . Back pain, chronic   . Leg pain   . HSV-2 (herpes simplex virus 2) infection 2013  . Bilateral chronic knee pain   . Fibromyalgia   . Hx of trichomoniasis   . Arthritis     needs 2 knee replacements  . Bulging disc   . UTI (lower urinary tract infection) 10/12/2012  . GERD (gastroesophageal reflux disease)   . Vaginal odor 12/28/2012    Had discharge +clue  . Yeast infection 02/04/2013  . Vaginal discharge 06/26/2013  . Trichimoniasis 06/26/2013  . Itching 06/26/2013  . Vaginal irritation 12/05/2013   Past Surgical History  Procedure Laterality Date  . Abdominal hysterectomy    . Breast enhancement surgery    . Tubal ligation     Family History  Problem Relation Age of Onset  . Hypertension Mother   . Diabetes Mother   . Depression Mother   . Hyperlipidemia Mother   . Fibromyalgia Mother   . Arthritis Mother   . Cancer Father     bone  . Hyperlipidemia Father   . Hypertension Father   . Bipolar disorder Father   . Depression Sister   . Hyperlipidemia Sister   . Fibromyalgia Sister   . Fibromyalgia Sister   . Colon cancer Neg Hx   . Bipolar disorder Other   . Drug abuse Other   . Alcohol abuse Other   . Hypertension Brother   . Other Brother     shingles; back problems  . Diabetes Paternal Grandmother   . Alzheimer's disease Maternal Grandmother   . Obesity Daughter   .  Other Daughter     back problems  . Bipolar disorder Daughter   . Depression Daughter   . Other Daughter     on pain meds   History  Substance Use Topics  . Smoking status: Light Tobacco Smoker -- 0.25 packs/day    Types: Cigarettes  . Smokeless tobacco: Never Used  . Alcohol Use: 0.0 oz/week    0 Standard drinks or equivalent per week     Comment: beer occasionally   OB History    Gravida Para Term Preterm AB TAB SAB Ectopic Multiple Living   2 2        2      Review of Systems ROS: Statement: All systems negative except as marked or noted in the HPI; Constitutional: Negative for fever and chills. ; ; Eyes: Negative for eye pain, redness and discharge. ; ; ENMT: Negative for ear pain, hoarseness, sore throat. +nasal congestion, sinus pressure and rhinorrhea. ; ; Cardiovascular: Negative for chest pain, palpitations, diaphoresis, dyspnea and peripheral edema. ; ; Respiratory: +cough, wheezing. Negative for stridor. ; ; Gastrointestinal: Negative for nausea, vomiting, diarrhea, abdominal pain, blood in stool, hematemesis, jaundice and rectal bleeding. . ; ;  Genitourinary: Negative for dysuria, flank pain and hematuria. ; ; Musculoskeletal: Negative for back pain and neck pain. Negative for swelling and trauma.; ; Skin: Negative for pruritus, rash, abrasions, blisters, bruising and skin lesion.; ; Neuro: Negative for headache, lightheadedness and neck stiffness. Negative for weakness, altered level of consciousness , altered mental status, extremity weakness, paresthesias, involuntary movement, seizure and syncope.      Allergies  Latex and Ampicillin  Home Medications   Prior to Admission medications   Medication Sig Start Date End Date Taking? Authorizing Provider  ALPRAZolam Duanne Moron) 1 MG tablet Take 1 tablet (1 mg total) by mouth 4 (four) times daily as needed for anxiety. 08/27/14  Yes Cloria Spring, MD  cetirizine (ZYRTEC) 10 MG tablet Take 10 mg by mouth daily.   Yes Historical  Provider, MD  diclofenac sodium (VOLTAREN) 1 % GEL Apply 2 g topically 4 (four) times daily. 04/04/14  Yes Alycia Rossetti, MD  Fexofenadine HCl [facility] ALLERGY PO) Take 1 tablet by mouth daily.   Yes Historical Provider, MD  gabapentin (NEURONTIN) 400 MG capsule Take 1 capsule (400 mg total) by mouth 3 (three) times daily. 08/27/14  Yes Cloria Spring, MD  ibuprofen (ADVIL,MOTRIN) 800 MG tablet TAKE ONE TABLET BY MOUTH TWICE DAILY AS NEEDED PAIN. 08/04/14  Yes Alycia Rossetti, MD  lamoTRIgine (LAMICTAL) 100 MG tablet Take 1 tablet (100 mg total) by mouth 2 (two) times daily. 08/27/14  Yes Cloria Spring, MD  mirtazapine (REMERON) 30 MG tablet Take 1 tablet (30 mg total) by mouth at bedtime. 08/27/14 08/27/15 Yes Cloria Spring, MD  omeprazole (PRILOSEC) 20 MG capsule TAKE (1) CAPSULE BY MOUTH ONCE DAILY FOR ACID REFLUX. 03/18/14  Yes Alycia Rossetti, MD  fluticasone (FLONASE) 50 MCG/ACT nasal spray INHALE 2 SPRAYS IN EACH NOSTRIL ONCE DAILY AS DIRECTED. 07/10/14   Alycia Rossetti, MD  levocetirizine (XYZAL) 5 MG tablet TAKE 1 TABLET BY MOUTH AT BEDTIME. Patient not taking: Reported on 08/30/2014 04/16/14   Estill Dooms, NP   BP 107/53 mmHg  Pulse 84  Temp(Src) 98.3 F (36.8 C) (Oral)  Resp 17  Ht 5\' 3"  (1.6 m)  Wt 281 lb (127.461 kg)  BMI 49.79 kg/m2  SpO2 98% Physical Exam  1200; Physical examination:  Nursing notes reviewed; Vital signs and O2 SAT reviewed;  Constitutional: Well developed, Well nourished, Well hydrated, In no acute distress; Head:  Normocephalic, atraumatic; Eyes: EOMI, PERRL, No scleral icterus; ENMT: TM's clear bilat. +edemetous nasal turbinates bilat with clear rhinorrhea. Mouth and pharynx normal, Mucous membranes moist; Neck: Supple, Full range of motion, No lymphadenopathy; Cardiovascular: Regular rate and rhythm, No murmur, rub, or gallop; Respiratory: Breath sounds coarse & equal bilaterally, scattered exp wheezes with audible wheezing. Speaking full sentences,  no retrax or access mm use. Normal respiratory effort/excursion; Chest: Nontender, Movement normal; Abdomen: Soft, Nontender, Nondistended, Normal bowel sounds; Genitourinary: No CVA tenderness; Extremities: Pulses normal, No tenderness, No edema, No calf edema or asymmetry.; Neuro: AA&Ox3, Major CN grossly intact.  Speech clear. No gross focal motor or sensory deficits in extremities.; Skin: Color normal, Warm, Dry.   ED Course  Procedures      EKG Interpretation   Date/Time:  Saturday Aug 30 2014 11:51:23 EDT Ventricular Rate:  94 PR Interval:  108 QRS Duration: 90 QT Interval:  357 QTC Calculation: 446 R Axis:   77 Text Interpretation:  Sinus rhythm Short PR interval When compared with  ECG of 07/07/2009 PR interval  is shorter Otherwise no significant change  Confirmed by [facility]  MD, Nunzio Cory [phone]) on 08/30/2014 1:48:19 PM      MDM  MDM Reviewed: previous chart, nursing note and vitals Reviewed previous: labs and ECG Interpretation: labs, ECG and x-ray   Results for orders placed or performed during the hospital encounter of 40/34/74  Basic metabolic panel  Result Value Ref Range   Sodium 138 135 - 145 mmol/L   Potassium 3.9 3.5 - 5.1 mmol/L   Chloride 105 101 - 111 mmol/L   CO2 25 22 - 32 mmol/L   Glucose, Bld 104 (H) 65 - 99 mg/dL   BUN 19 6 - 20 mg/dL   Creatinine, Ser 0.69 0.44 - 1.00 mg/dL   Calcium 8.7 (L) 8.9 - 10.3 mg/dL   GFR calc non Af Amer >60 >60 mL/min   GFR calc Af Amer >60 >60 mL/min   Anion gap 8 5 - 15  CBC with Differential  Result Value Ref Range   WBC 5.9 4.0 - 10.5 K/uL   RBC 4.62 3.87 - 5.11 MIL/uL   Hemoglobin 14.0 12.0 - 15.0 g/dL   HCT 43.6 36.0 - 46.0 %   MCV 94.4 78.0 - 100.0 fL   MCH 30.3 26.0 - 34.0 pg   MCHC 32.1 30.0 - 36.0 g/dL   RDW 14.0 11.5 - 15.5 %   Platelets 290 150 - 400 K/uL   Neutrophils Relative % 49 43 - 77 %   Neutro Abs 2.9 1.7 - 7.7 K/uL   Lymphocytes Relative 40 12 - 46 %   Lymphs Abs 2.3 0.7 - 4.0 K/uL    Monocytes Relative 9 3 - 12 %   Monocytes Absolute 0.5 0.1 - 1.0 K/uL   Eosinophils Relative 2 0 - 5 %   Eosinophils Absolute 0.1 0.0 - 0.7 K/uL   Basophils Relative 0 0 - 1 %   Basophils Absolute 0.0 0.0 - 0.1 K/uL  Troponin I  Result Value Ref Range   Troponin I <0.03 <0.031 ng/mL  I-Stat CG4 Lactic Acid, ED  Result Value Ref Range   Lactic Acid, Venous 1.02 0.5 - 2.0 mmol/L   Dg Chest Portable 1 View 08/30/2014   CLINICAL DATA:  Patient with c/o cough, congestion, shortness of breath x 3 weeks. Patient states OTC mucinex without relief, but has not seen PMD for s/s. Patient with audible wheezing.  EXAM: PORTABLE CHEST - 1 VIEW  COMPARISON:  None.  FINDINGS: Cardiac silhouette normal in size and configuration. Normal mediastinal and hilar contours. Linear atelectasis extends laterally from the right hilum. Lungs otherwise clear. No pleural effusion or pneumothorax.  Bony thorax is intact.  IMPRESSION: No active disease.   Electronically Signed   By: Lajean Manes M.D.   On: 08/30/2014 12:18    1345:  Pt states she "feels better" after 2 neb treatments.  NAD, lungs CTA bilat, no wheezing, resps easy, speaking full sentences, Sats 98-100% R/A. Pt states she wants to go home now. Will continue to tx symptomatically at this time. Dx and testing d/w pt.  Questions answered.  Verb understanding, agreeable to d/c home with outpt f/u.      Francine Graven, DO 09/03/14 1120

## 2014-08-30 NOTE — ED Notes (Signed)
Patient with no complaints at this time. Respirations even and unlabored. Skin warm/dry. Discharge instructions reviewed with patient at this time. Patient given opportunity to voice concerns/ask questions. Patient discharged at this time and left Emergency Department with steady gait.   

## 2014-08-30 NOTE — Discharge Instructions (Signed)
°Emergency Department Resource Guide °1) Find a Doctor and Pay Out of Pocket °Although you won't have to find out who is covered by your insurance plan, it is a good idea to ask around and get recommendations. You will then need to call the office and see if the doctor you have chosen will accept you as a new patient and what types of options they offer for patients who are self-pay. Some doctors offer discounts or will set up payment plans for their patients who do not have insurance, but you will need to ask so you aren't surprised when you get to your appointment. ° °2) Contact Your Local Health Department °Not all health departments have doctors that can see patients for sick visits, but many do, so it is worth a call to see if yours does. If you don't know where your local health department is, you can check in your phone book. The CDC also has a tool to help you locate your state's health department, and many state websites also have listings of all of their local health departments. ° °3) Find a Walk-in Clinic °If your illness is not likely to be very severe or complicated, you may want to try a walk in clinic. These are popping up all over the country in pharmacies, drugstores, and shopping centers. They're usually staffed by nurse practitioners or physician assistants that have been trained to treat common illnesses and complaints. They're usually fairly quick and inexpensive. However, if you have serious medical issues or chronic medical problems, these are probably not your best option. ° °No Primary Care Doctor: °- Call [facility] at  832-8000 - they can help you locate a primary care doctor that  accepts your insurance, provides certain services, etc. °- Physician Referral Service- 1-800-533-3463 ° °Chronic Pain Problems: °Organization         Address  Phone   Notes  °[facility]  (336) 297-2271 Patients need to be referred by their primary care doctor.  ° °Medication  Assistance: °Organization         Address  Phone   Notes  °[place] [facility] [address] °[place], [place] [address] (336) 641-8030 --Must be a resident of [place] °-- Must have NO insurance coverage whatsoever (no Medicaid/ Medicare, etc.) °-- The pt. MUST have a primary care doctor that directs their care regularly and follows them in the community °  °[facility]  (866) 331-1348   °[employer]  (888) 892-1162   ° °Agencies that provide inexpensive medical care: °Organization         Address  Phone   Notes  °[facility]  (336) 832-8035   °[facility]    (336) 832-7272   °[facility] [address] °[place], [place] [address] (336) 832-4777   °[facility] [address], °[place] (336) 271-4999   °[facility]    (336) 373-0678   °[facility]    (336) 272-1050   °[facility] ° [address], [place] Phone:  (336) 832-4444, Fax:  (336) 832-4440 Hours of Operation:  9 am - 6 pm, M-F.  Also accepts Medicaid/Medicare and self-pay.  °[facility] ° [address], [place] Phone: (336) 832-3150, Fax: (336) 832-3151. Hours of Operation:  8:30 am - 5:30 pm, M-F.  Also accepts Medicaid and self-pay.  °[facility] [place] [address], [place] Phone: (336) 878-6027   °[facility] [address], [place], [place] (336)723-1848, Ext. 123 Mondays & Thursdays: 7-9 AM.  First 15 patients are seen on a first come, first serve basis. °  ° °Medicaid-accepting [place] Providers: ° °Organization         Address  Phone   Notes  °[facility] [address], [place] (336) 641-2100 Also accepts self-pay patients.  °[facility] [address], [place] ° (336) 856-9996   °[facility] [address], [place]  (336) 288-8857   °[facility] [address] [place] [address], [place] (336) 299-7000   °Veita Bland [address], [place]  ° (336) 373-1557 Only accepts [place] Access Medicaid patients after they have their name applied to their card.  ° °Self-Pay (no insurance) in [place]: ° °Organization         Address  Phone   Notes  °Sickle Cell Patients, [facility] [address], [place] (336) 832-1970   °[facility] [address], [place] (336) 832-4400   °[facility] ° [address], [place] (336) 992-4800   °[facility]/Dr. Osei-Bonsu ° [address] [place] [address], [place] or [address], [place] (336) 841-8500 Phone number for both [place] and [place] locations is the same.  °[facility] [address], [place] (336) 299-0000   °[facility] [address] [place] [address], [place] or [address] (336) 852-7530 °(336) 878-2260   °[facility] [address], [place] (336) 350-1642, phone; (336) 294-5005, fax Sees patients 1st and 3rd Saturday of every month.  Must not qualify for public or private insurance (i.e. Medicaid, Medicare, [place] Health Choice, Veterans' Benefits) • Household income should be no more than 200% of the poverty level •The clinic cannot treat you if you are pregnant or think you are pregnant • Sexually transmitted diseases are not treated at the clinic.  ° ° °Dental Care: °Organization         Address  Phone  Notes  °[place] [facility] [address],  (336) 641-6152 Accepts children up to age 21 who are enrolled in Medicaid or [place] Health Choice; pregnant women with a Medicaid card; and children who have applied for Medicaid or [place] Health Choice, but were declined, whose parents can pay a reduced fee at time of service.  °[place]  [facility] [place]  [address], [place] (336) 641-7733 Accepts children up to age 21 who are enrolled in Medicaid or [place] Health Choice; pregnant women with a Medicaid card; and children who have applied for Medicaid or [place] Health Choice, but were declined, whose parents can pay a reduced fee at time of service.  °[facility] ° [address],  (336) 641-4533 Patients are seen by appointment only. Walk-ins are not accepted. [facility] will see patients 18 years of age and older. °Monday - Tuesday (8am-5pm) °Most Wednesdays (8:30-5pm) °$30 per visit, cash only  °[facility] ° [address], [place] (336) 641-4533 Patients are seen by appointment only. Walk-ins are not accepted. [facility] will see patients 18 years of age and older. °One   Wednesday Evening (Monthly: Volunteer Based).  $30 per visit, cash only  °[facility]  (919) 537-3737 for adults; Children under age 4, call Graduate Pediatric Dentistry at (919) [phone]. Children aged 4-14, please call (919) [phone] to request a pediatric application. ° Dental services are provided in all areas of dental care including fillings, crowns and bridges, complete and partial dentures, implants, gum treatment, root canals, and extractions. Preventive care is also provided. Treatment is provided to both adults and children. °Patients are selected via a lottery and there is often a waiting list. °  °[facility] [address], °[place] ° (336) 763-8833 [email or web address] °  °[facility] [address], [place], [employer] (336)723-1848, Ext. 123 Second and Fourth Thursday of each month, opens at 6:30 AM; Clinic ends at 9 AM.  Patients are seen on a first-come first-served basis, and a limited number are seen during each clinic.  ° °[facility] ° [address], [place], [place] (336) 723-7904    Eligibility Requirements °You must have lived in [place], or [place] counties for at least the last three months. °  You cannot be eligible for state or federal sponsored healthcare insurance, including Veterans Administration, Medicaid, or Medicare. °  You generally cannot be eligible for healthcare insurance through your employer.  °  How to apply: °Eligibility screenings are held every Tuesday and Wednesday afternoon from 1:00 pm until 4:00 pm. You do not need an appointment for the interview!  °[facility] [address], [place], [place] 336-631-2330   °[facility]  336-342-8273   °[facility]  336-703-3100   °[facility]  336-570-6415   ° °Behavioral Health Resources in the Community: °Intensive Outpatient Programs °Organization         Address  Phone  Notes  °[facility] [address], [place], [place] 336-878-6098   °[facility] [address], [place], [place] 336-832-9800   °[facility] [address], [place], [place] ° 336-882-2125   °[facility] [address],  °[place], [place] 1-800-853-5163 or 336-641-4981   °Substance Abuse Resources °Organization         Address  Phone  Notes  °[facility]  336-882-2125   °[facility]  336-784-9470   °[facility]  336-285-9073   °[facility]  336-845-3988   °Residential & Outpatient Substance Abuse Program  1-800-659-3381   °Psychological Services °Organization         Address  Phone  Notes  °[facility]  336- 832-9600   °[facility]  336- 378-7881   °[facility] [address], [place] 1-800-853-5163 or 336-641-4981   ° °Mobile Crisis Teams °Organization         Address  Phone  Notes  °[facility], Mobile Crisis Care Unit  1-877-626-1772   °Assertive °[facility] ° [address]  [place], [place] 336-834-9664   °Sharon DeEsch [address] °[place] 336-554-5454   ° °Self-Help/Support Groups °Organization         Address  Phone             Notes  °[facility] [place] - variety of support groups  336- 373-1402 Call for more information  °Narcotics Anonymous (NA), [facility] [address], °[place]  2 meetings at this location  ° °  Residential Treatment Programs °Organization         Address  Phone  Notes  °[facility] [address],    °[place]  1-866-801-8205   °[facility] ° [address], [place], [place] 704-293-8524   °[facility] [address], [place] 336-845-3988 Admissions: 8am-3pm M-F  °[facility] [address]    °[place], [place] 336-841-1104   °[facility] [address], [place], [place] 336-379-7146   °[facility] [address]  °[place], [place] 336-285-9073   °[facility] - Intensive Outpatient [address], [place], [place] 336-852-3033   °[facility] [address]  °[place], [place] 1-877-615-2722 or 336-784-9470   °[facility] [address] [place], [place] 336-227-7417 Accepts Medicaid  °[facility] [address]  °[place] 1-800-659-3381 Substance Abuse/Addiction Treatment  ° °[place] Behavioral Health Resources °Organization         Address  Phone  Notes  °[facility]  (888) 581-9988   °Julie Brannon, PhD [address] [place], [place]   (336) 349-5553 or (336) 951-0000   °[facility]   [address] °[place], [place] (336) 349-4454   °[facility] [address], [place], [place] (336) 342-8316 Insurance/Medicaid/sponsorship through [facility]  °[facility] [address]                                    [place], [place] (336) 342-8316 Therapy/tele-psych/case    °[facility] [address]  ° [place], [place] (336) 349-2233    °Dr. Arfeen  (336) 349-4544   °[facility] [place]  [employer] [place] [facility] 1) [address],  °2) [address], [place] °3)  [address], [place] (336) 349-3220 °(336) 342-7768 ° °(336) 342-8140   °[place] [facility] (336) 342-1394 or (336) 342-3537 (After Hours)    ° ° °Take the prescriptions as directed.  Use your albuterol inhaler (2 to 4 puffs) every 4 hours for the next 7 days, then as needed for cough, wheezing, or shortness of breath.  Call your regular medical doctor Monday morning to schedule a follow up appointment within the next 3 days.  Return to the Emergency Department immediately sooner if worsening.  ° °

## 2014-08-30 NOTE — ED Notes (Signed)
Inhaler and spacer provided to patient. Instructions on use and care of equipment provided. Demonstration provided by patient.

## 2014-08-30 NOTE — ED Notes (Signed)
Patient with c/o cough, congestion, shortness of breath x 3 weeks. Patient states OTC mucinex without relief, but has not seen PMD for s/s. Patient with audible wheezing.

## 2014-08-31 DIAGNOSIS — E119 Type 2 diabetes mellitus without complications: Secondary | ICD-10-CM | POA: Diagnosis not present

## 2014-08-31 DIAGNOSIS — G589 Mononeuropathy, unspecified: Secondary | ICD-10-CM | POA: Diagnosis not present

## 2014-08-31 DIAGNOSIS — M179 Osteoarthritis of knee, unspecified: Secondary | ICD-10-CM | POA: Diagnosis not present

## 2014-09-01 DIAGNOSIS — E119 Type 2 diabetes mellitus without complications: Secondary | ICD-10-CM | POA: Diagnosis not present

## 2014-09-01 DIAGNOSIS — G589 Mononeuropathy, unspecified: Secondary | ICD-10-CM | POA: Diagnosis not present

## 2014-09-01 DIAGNOSIS — M179 Osteoarthritis of knee, unspecified: Secondary | ICD-10-CM | POA: Diagnosis not present

## 2014-09-02 DIAGNOSIS — E119 Type 2 diabetes mellitus without complications: Secondary | ICD-10-CM | POA: Diagnosis not present

## 2014-09-02 DIAGNOSIS — G589 Mononeuropathy, unspecified: Secondary | ICD-10-CM | POA: Diagnosis not present

## 2014-09-02 DIAGNOSIS — M179 Osteoarthritis of knee, unspecified: Secondary | ICD-10-CM | POA: Diagnosis not present

## 2014-09-03 DIAGNOSIS — G589 Mononeuropathy, unspecified: Secondary | ICD-10-CM | POA: Diagnosis not present

## 2014-09-03 DIAGNOSIS — E119 Type 2 diabetes mellitus without complications: Secondary | ICD-10-CM | POA: Diagnosis not present

## 2014-09-03 DIAGNOSIS — M179 Osteoarthritis of knee, unspecified: Secondary | ICD-10-CM | POA: Diagnosis not present

## 2014-09-04 DIAGNOSIS — G589 Mononeuropathy, unspecified: Secondary | ICD-10-CM | POA: Diagnosis not present

## 2014-09-04 DIAGNOSIS — E119 Type 2 diabetes mellitus without complications: Secondary | ICD-10-CM | POA: Diagnosis not present

## 2014-09-04 DIAGNOSIS — M179 Osteoarthritis of knee, unspecified: Secondary | ICD-10-CM | POA: Diagnosis not present

## 2014-09-05 DIAGNOSIS — G589 Mononeuropathy, unspecified: Secondary | ICD-10-CM | POA: Diagnosis not present

## 2014-09-05 DIAGNOSIS — E119 Type 2 diabetes mellitus without complications: Secondary | ICD-10-CM | POA: Diagnosis not present

## 2014-09-05 DIAGNOSIS — M179 Osteoarthritis of knee, unspecified: Secondary | ICD-10-CM | POA: Diagnosis not present

## 2014-09-06 DIAGNOSIS — G589 Mononeuropathy, unspecified: Secondary | ICD-10-CM | POA: Diagnosis not present

## 2014-09-06 DIAGNOSIS — E119 Type 2 diabetes mellitus without complications: Secondary | ICD-10-CM | POA: Diagnosis not present

## 2014-09-06 DIAGNOSIS — M179 Osteoarthritis of knee, unspecified: Secondary | ICD-10-CM | POA: Diagnosis not present

## 2014-09-07 DIAGNOSIS — G589 Mononeuropathy, unspecified: Secondary | ICD-10-CM | POA: Diagnosis not present

## 2014-09-07 DIAGNOSIS — M179 Osteoarthritis of knee, unspecified: Secondary | ICD-10-CM | POA: Diagnosis not present

## 2014-09-07 DIAGNOSIS — E119 Type 2 diabetes mellitus without complications: Secondary | ICD-10-CM | POA: Diagnosis not present

## 2014-09-08 ENCOUNTER — Ambulatory Visit: Payer: Medicare Other | Admitting: Family Medicine

## 2014-09-08 ENCOUNTER — Telehealth (HOSPITAL_COMMUNITY): Payer: Self-pay | Admitting: *Deleted

## 2014-09-08 DIAGNOSIS — E119 Type 2 diabetes mellitus without complications: Secondary | ICD-10-CM | POA: Diagnosis not present

## 2014-09-08 DIAGNOSIS — G589 Mononeuropathy, unspecified: Secondary | ICD-10-CM | POA: Diagnosis not present

## 2014-09-08 DIAGNOSIS — M179 Osteoarthritis of knee, unspecified: Secondary | ICD-10-CM | POA: Diagnosis not present

## 2014-09-08 NOTE — Telephone Encounter (Signed)
patient left voice message with question regarding meds.   Would like phone call please.

## 2014-09-09 DIAGNOSIS — G589 Mononeuropathy, unspecified: Secondary | ICD-10-CM | POA: Diagnosis not present

## 2014-09-09 DIAGNOSIS — E119 Type 2 diabetes mellitus without complications: Secondary | ICD-10-CM | POA: Diagnosis not present

## 2014-09-09 DIAGNOSIS — M179 Osteoarthritis of knee, unspecified: Secondary | ICD-10-CM | POA: Diagnosis not present

## 2014-09-09 NOTE — Telephone Encounter (Signed)
Called pt in reference to previous call and per pt she fixed everything and she is fine now.

## 2014-09-10 DIAGNOSIS — M179 Osteoarthritis of knee, unspecified: Secondary | ICD-10-CM | POA: Diagnosis not present

## 2014-09-10 DIAGNOSIS — G589 Mononeuropathy, unspecified: Secondary | ICD-10-CM | POA: Diagnosis not present

## 2014-09-10 DIAGNOSIS — E119 Type 2 diabetes mellitus without complications: Secondary | ICD-10-CM | POA: Diagnosis not present

## 2014-09-11 DIAGNOSIS — E119 Type 2 diabetes mellitus without complications: Secondary | ICD-10-CM | POA: Diagnosis not present

## 2014-09-11 DIAGNOSIS — G589 Mononeuropathy, unspecified: Secondary | ICD-10-CM | POA: Diagnosis not present

## 2014-09-11 DIAGNOSIS — M179 Osteoarthritis of knee, unspecified: Secondary | ICD-10-CM | POA: Diagnosis not present

## 2014-09-12 DIAGNOSIS — M179 Osteoarthritis of knee, unspecified: Secondary | ICD-10-CM | POA: Diagnosis not present

## 2014-09-12 DIAGNOSIS — G589 Mononeuropathy, unspecified: Secondary | ICD-10-CM | POA: Diagnosis not present

## 2014-09-12 DIAGNOSIS — E119 Type 2 diabetes mellitus without complications: Secondary | ICD-10-CM | POA: Diagnosis not present

## 2014-09-13 DIAGNOSIS — M179 Osteoarthritis of knee, unspecified: Secondary | ICD-10-CM | POA: Diagnosis not present

## 2014-09-13 DIAGNOSIS — E119 Type 2 diabetes mellitus without complications: Secondary | ICD-10-CM | POA: Diagnosis not present

## 2014-09-13 DIAGNOSIS — G589 Mononeuropathy, unspecified: Secondary | ICD-10-CM | POA: Diagnosis not present

## 2014-09-14 DIAGNOSIS — E119 Type 2 diabetes mellitus without complications: Secondary | ICD-10-CM | POA: Diagnosis not present

## 2014-09-14 DIAGNOSIS — G589 Mononeuropathy, unspecified: Secondary | ICD-10-CM | POA: Diagnosis not present

## 2014-09-14 DIAGNOSIS — M179 Osteoarthritis of knee, unspecified: Secondary | ICD-10-CM | POA: Diagnosis not present

## 2014-09-15 DIAGNOSIS — E119 Type 2 diabetes mellitus without complications: Secondary | ICD-10-CM | POA: Diagnosis not present

## 2014-09-15 DIAGNOSIS — G589 Mononeuropathy, unspecified: Secondary | ICD-10-CM | POA: Diagnosis not present

## 2014-09-15 DIAGNOSIS — M179 Osteoarthritis of knee, unspecified: Secondary | ICD-10-CM | POA: Diagnosis not present

## 2014-09-16 ENCOUNTER — Other Ambulatory Visit: Payer: Self-pay | Admitting: Family Medicine

## 2014-09-16 DIAGNOSIS — G589 Mononeuropathy, unspecified: Secondary | ICD-10-CM | POA: Diagnosis not present

## 2014-09-16 DIAGNOSIS — E119 Type 2 diabetes mellitus without complications: Secondary | ICD-10-CM | POA: Diagnosis not present

## 2014-09-16 DIAGNOSIS — M179 Osteoarthritis of knee, unspecified: Secondary | ICD-10-CM | POA: Diagnosis not present

## 2014-09-16 NOTE — Telephone Encounter (Signed)
Refill appropriate and filled per protocol. 

## 2014-09-17 DIAGNOSIS — M179 Osteoarthritis of knee, unspecified: Secondary | ICD-10-CM | POA: Diagnosis not present

## 2014-09-17 DIAGNOSIS — E119 Type 2 diabetes mellitus without complications: Secondary | ICD-10-CM | POA: Diagnosis not present

## 2014-09-17 DIAGNOSIS — G589 Mononeuropathy, unspecified: Secondary | ICD-10-CM | POA: Diagnosis not present

## 2014-09-18 DIAGNOSIS — G589 Mononeuropathy, unspecified: Secondary | ICD-10-CM | POA: Diagnosis not present

## 2014-09-18 DIAGNOSIS — E119 Type 2 diabetes mellitus without complications: Secondary | ICD-10-CM | POA: Diagnosis not present

## 2014-09-18 DIAGNOSIS — M179 Osteoarthritis of knee, unspecified: Secondary | ICD-10-CM | POA: Diagnosis not present

## 2014-09-19 DIAGNOSIS — M179 Osteoarthritis of knee, unspecified: Secondary | ICD-10-CM | POA: Diagnosis not present

## 2014-09-19 DIAGNOSIS — G589 Mononeuropathy, unspecified: Secondary | ICD-10-CM | POA: Diagnosis not present

## 2014-09-19 DIAGNOSIS — E119 Type 2 diabetes mellitus without complications: Secondary | ICD-10-CM | POA: Diagnosis not present

## 2014-09-20 DIAGNOSIS — G589 Mononeuropathy, unspecified: Secondary | ICD-10-CM | POA: Diagnosis not present

## 2014-09-20 DIAGNOSIS — M179 Osteoarthritis of knee, unspecified: Secondary | ICD-10-CM | POA: Diagnosis not present

## 2014-09-20 DIAGNOSIS — E119 Type 2 diabetes mellitus without complications: Secondary | ICD-10-CM | POA: Diagnosis not present

## 2014-09-21 DIAGNOSIS — G589 Mononeuropathy, unspecified: Secondary | ICD-10-CM | POA: Diagnosis not present

## 2014-09-21 DIAGNOSIS — E119 Type 2 diabetes mellitus without complications: Secondary | ICD-10-CM | POA: Diagnosis not present

## 2014-09-21 DIAGNOSIS — M179 Osteoarthritis of knee, unspecified: Secondary | ICD-10-CM | POA: Diagnosis not present

## 2014-09-22 DIAGNOSIS — M179 Osteoarthritis of knee, unspecified: Secondary | ICD-10-CM | POA: Diagnosis not present

## 2014-09-22 DIAGNOSIS — E119 Type 2 diabetes mellitus without complications: Secondary | ICD-10-CM | POA: Diagnosis not present

## 2014-09-22 DIAGNOSIS — G589 Mononeuropathy, unspecified: Secondary | ICD-10-CM | POA: Diagnosis not present

## 2014-09-23 ENCOUNTER — Encounter: Payer: Self-pay | Admitting: Family Medicine

## 2014-09-23 DIAGNOSIS — G589 Mononeuropathy, unspecified: Secondary | ICD-10-CM | POA: Diagnosis not present

## 2014-09-23 DIAGNOSIS — E119 Type 2 diabetes mellitus without complications: Secondary | ICD-10-CM | POA: Diagnosis not present

## 2014-09-23 DIAGNOSIS — M179 Osteoarthritis of knee, unspecified: Secondary | ICD-10-CM | POA: Diagnosis not present

## 2014-09-24 DIAGNOSIS — G589 Mononeuropathy, unspecified: Secondary | ICD-10-CM | POA: Diagnosis not present

## 2014-09-24 DIAGNOSIS — E119 Type 2 diabetes mellitus without complications: Secondary | ICD-10-CM | POA: Diagnosis not present

## 2014-09-24 DIAGNOSIS — M179 Osteoarthritis of knee, unspecified: Secondary | ICD-10-CM | POA: Diagnosis not present

## 2014-09-25 DIAGNOSIS — G589 Mononeuropathy, unspecified: Secondary | ICD-10-CM | POA: Diagnosis not present

## 2014-09-25 DIAGNOSIS — M179 Osteoarthritis of knee, unspecified: Secondary | ICD-10-CM | POA: Diagnosis not present

## 2014-09-25 DIAGNOSIS — E119 Type 2 diabetes mellitus without complications: Secondary | ICD-10-CM | POA: Diagnosis not present

## 2014-09-26 DIAGNOSIS — G589 Mononeuropathy, unspecified: Secondary | ICD-10-CM | POA: Diagnosis not present

## 2014-09-26 DIAGNOSIS — M179 Osteoarthritis of knee, unspecified: Secondary | ICD-10-CM | POA: Diagnosis not present

## 2014-09-26 DIAGNOSIS — E119 Type 2 diabetes mellitus without complications: Secondary | ICD-10-CM | POA: Diagnosis not present

## 2014-09-27 DIAGNOSIS — E119 Type 2 diabetes mellitus without complications: Secondary | ICD-10-CM | POA: Diagnosis not present

## 2014-09-27 DIAGNOSIS — M179 Osteoarthritis of knee, unspecified: Secondary | ICD-10-CM | POA: Diagnosis not present

## 2014-09-27 DIAGNOSIS — G589 Mononeuropathy, unspecified: Secondary | ICD-10-CM | POA: Diagnosis not present

## 2014-09-28 DIAGNOSIS — G589 Mononeuropathy, unspecified: Secondary | ICD-10-CM | POA: Diagnosis not present

## 2014-09-28 DIAGNOSIS — E119 Type 2 diabetes mellitus without complications: Secondary | ICD-10-CM | POA: Diagnosis not present

## 2014-09-28 DIAGNOSIS — M179 Osteoarthritis of knee, unspecified: Secondary | ICD-10-CM | POA: Diagnosis not present

## 2014-09-29 DIAGNOSIS — E119 Type 2 diabetes mellitus without complications: Secondary | ICD-10-CM | POA: Diagnosis not present

## 2014-09-29 DIAGNOSIS — G589 Mononeuropathy, unspecified: Secondary | ICD-10-CM | POA: Diagnosis not present

## 2014-09-29 DIAGNOSIS — M179 Osteoarthritis of knee, unspecified: Secondary | ICD-10-CM | POA: Diagnosis not present

## 2014-09-30 DIAGNOSIS — M179 Osteoarthritis of knee, unspecified: Secondary | ICD-10-CM | POA: Diagnosis not present

## 2014-09-30 DIAGNOSIS — G589 Mononeuropathy, unspecified: Secondary | ICD-10-CM | POA: Diagnosis not present

## 2014-09-30 DIAGNOSIS — E119 Type 2 diabetes mellitus without complications: Secondary | ICD-10-CM | POA: Diagnosis not present

## 2014-10-01 DIAGNOSIS — M179 Osteoarthritis of knee, unspecified: Secondary | ICD-10-CM | POA: Diagnosis not present

## 2014-10-01 DIAGNOSIS — G589 Mononeuropathy, unspecified: Secondary | ICD-10-CM | POA: Diagnosis not present

## 2014-10-01 DIAGNOSIS — E119 Type 2 diabetes mellitus without complications: Secondary | ICD-10-CM | POA: Diagnosis not present

## 2014-10-02 DIAGNOSIS — M179 Osteoarthritis of knee, unspecified: Secondary | ICD-10-CM | POA: Diagnosis not present

## 2014-10-02 DIAGNOSIS — E119 Type 2 diabetes mellitus without complications: Secondary | ICD-10-CM | POA: Diagnosis not present

## 2014-10-02 DIAGNOSIS — G589 Mononeuropathy, unspecified: Secondary | ICD-10-CM | POA: Diagnosis not present

## 2014-10-03 DIAGNOSIS — G589 Mononeuropathy, unspecified: Secondary | ICD-10-CM | POA: Diagnosis not present

## 2014-10-03 DIAGNOSIS — M179 Osteoarthritis of knee, unspecified: Secondary | ICD-10-CM | POA: Diagnosis not present

## 2014-10-03 DIAGNOSIS — E119 Type 2 diabetes mellitus without complications: Secondary | ICD-10-CM | POA: Diagnosis not present

## 2014-10-04 DIAGNOSIS — E119 Type 2 diabetes mellitus without complications: Secondary | ICD-10-CM | POA: Diagnosis not present

## 2014-10-04 DIAGNOSIS — M179 Osteoarthritis of knee, unspecified: Secondary | ICD-10-CM | POA: Diagnosis not present

## 2014-10-04 DIAGNOSIS — G589 Mononeuropathy, unspecified: Secondary | ICD-10-CM | POA: Diagnosis not present

## 2014-10-05 DIAGNOSIS — G589 Mononeuropathy, unspecified: Secondary | ICD-10-CM | POA: Diagnosis not present

## 2014-10-05 DIAGNOSIS — M179 Osteoarthritis of knee, unspecified: Secondary | ICD-10-CM | POA: Diagnosis not present

## 2014-10-05 DIAGNOSIS — E119 Type 2 diabetes mellitus without complications: Secondary | ICD-10-CM | POA: Diagnosis not present

## 2014-10-24 ENCOUNTER — Other Ambulatory Visit: Payer: Self-pay | Admitting: Family Medicine

## 2014-10-24 NOTE — Telephone Encounter (Signed)
Medication refilled per protocol. 

## 2014-10-31 ENCOUNTER — Other Ambulatory Visit (HOSPITAL_COMMUNITY): Payer: Self-pay | Admitting: Psychiatry

## 2014-11-12 ENCOUNTER — Ambulatory Visit (INDEPENDENT_AMBULATORY_CARE_PROVIDER_SITE_OTHER): Payer: Medicare Other | Admitting: Family Medicine

## 2014-11-12 ENCOUNTER — Encounter: Payer: Self-pay | Admitting: Family Medicine

## 2014-11-12 VITALS — BP 130/74 | HR 76 | Temp 98.4°F | Resp 18 | Ht 63.0 in | Wt 284.0 lb

## 2014-11-12 DIAGNOSIS — Z720 Tobacco use: Secondary | ICD-10-CM | POA: Diagnosis not present

## 2014-11-12 DIAGNOSIS — K219 Gastro-esophageal reflux disease without esophagitis: Secondary | ICD-10-CM

## 2014-11-12 DIAGNOSIS — S41111D Laceration without foreign body of right upper arm, subsequent encounter: Secondary | ICD-10-CM

## 2014-11-12 DIAGNOSIS — F319 Bipolar disorder, unspecified: Secondary | ICD-10-CM

## 2014-11-12 DIAGNOSIS — Z23 Encounter for immunization: Secondary | ICD-10-CM | POA: Diagnosis not present

## 2014-11-12 DIAGNOSIS — M797 Fibromyalgia: Secondary | ICD-10-CM | POA: Diagnosis not present

## 2014-11-12 MED ORDER — TRAZODONE HCL 150 MG PO TABS: 150.0000 mg | ORAL_TABLET | Freq: Every day | ORAL | Status: DC

## 2014-11-12 MED ORDER — OMEPRAZOLE 40 MG PO CPDR: 40.0000 mg | DELAYED_RELEASE_CAPSULE | Freq: Every day | ORAL | Status: DC

## 2014-11-12 NOTE — Progress Notes (Signed)
Patient ID: Annette Galvan, female   DOB: [birth year], 54 y.o.   MRN: [id number]   Subjective:    Patient ID: Annette Galvan, female    DOB: [birth year], 54 y.o.   MRN: [id number]  Patient presents for 6 month F/U  patient here for follow-up. She states that she is depressed all the time. She was seen by her psychiatrist back in May at that time she was taken off of trazodone and put on Remeron because she states that she was still having difficulty sleeping. She gained about 20 pounds on Remeron therefore stopped the medication herself. She still has problems sleeping and wishes that she have lots of friends and family around to give her help. She also once it be more mobile. She does have a nurse aide and was fearful that she was one to lose the services. She is taken off her other medications as prescribed including her Lamictal.  Chronic joint knee pain. She is still taking gabapentin as prescribed she's also using ibuprofen.  Acid reflux she states that the Prilosec 20 mg no longer helping which she takes 40 mg her symptoms are improved and she does not have the nausea associated.  She also tells me that she picked up an old standing on the side of the road and has been trying to repair it when she turned the motor on it actually cut her on the forearm she was very adamant that she was not trying to hurt herself. She cleaned at home has not had any drainage from the lesions. She is due for tetanus   Review Of Systems:  GEN- denies fatigue, fever, weight loss,weakness, recent illness HEENT- denies eye drainage, change in vision, nasal discharge, CVS- denies chest pain, palpitations RESP- denies SOB, cough, wheeze ABD- + N/ denies V, change in stools, +abd pain GU- denies dysuria, hematuria, dribbling, incontinence MSK- + joint pain, muscle aches, injury Neuro- denies headache, dizziness, syncope, seizure activity       Objective:    BP 130/74 mmHg  Pulse 76  Temp(Src) 98.4 F (36.9 C)  (Oral)  Resp 18  Ht 5\' 3"  (1.6 m)  Wt 284 lb (128.822 kg)  BMI 50.32 kg/m2 GEN- NAD, alert and oriented x3 HEENT- PERRL, EOMI, non injected sclera, pink conjunctiva, MMM, oropharynx clear CVS- RRR, no murmur RESP-CTAB ABD-NABS,soft,NT,ND Psych- pressure speech at times, tangential, depressed- tearful, no SI, no hallucinations EXT- No edema Skin- few small laceration to forearm- healing, no erythema  Pulses- Radial, 2+        Assessment & Plan:      Problem List Items Addressed This Visit    Tobacco use   Morbid obesity   GERD (gastroesophageal reflux disease)    Increase Prilosec to 40 mg      Relevant Medications   omeprazole (PRILOSEC) 40 MG capsule   Fibromyalgia   Bipolar 1 disorder - Primary    Her depression is not very well controlled but she is at chronic anxiety depression bipolar PTSD for some time now and is very difficult to control her symptoms. She also has underlying fibromyalgia. She has not seen her therapist yet. She is worried that she will had to bring up all of her past problems as a child. I'm going to change her back to trazodone as she has significant weight gain with the Remeron and she has stopped this. I've also advised her to call her psychiatrist. Since she's been off of the trazodone she was up to  300 mg. Im going to start her back with 150 mg tablet and she is to take one half of this. I do not think that she is suicidal and that the scratches on her arm are from trying to fix the old vein.       Other Visit Diagnoses    Laceration of arm, right, subsequent encounter        no sign of infection, TDAP given    Need for prophylactic vaccination with combined diphtheria-tetanus-pertussis (DTP) vaccine           Note: This dictation was prepared with Dragon dictation along with smaller phrase technology. Any transcriptional errors that result from this process are unintentional.

## 2014-11-12 NOTE — Assessment & Plan Note (Signed)
Increase Prilosec to 40mg.

## 2014-11-12 NOTE — Patient Instructions (Addendum)
Take 1/2 of trazodone at bedtime for 2 weeks, then take 1 tablet at bedtime Incraese prilsoec to 40mg  once a day  Tetanus booster given  F/U 6 months

## 2014-11-12 NOTE — Assessment & Plan Note (Signed)
Her depression is not very well controlled but she is at chronic anxiety depression bipolar PTSD for some time now and is very difficult to control her symptoms. She also has underlying fibromyalgia. She has not seen her therapist yet. She is worried that she will had to bring up all of her past problems as a child. I'm going to change her back to trazodone as she has significant weight gain with the Remeron and she has stopped this. I've also advised her to call her psychiatrist. Since she's been off of the trazodone she was up to 300 mg. Im going to start her back with 150 mg tablet and she is to take one half of this. I do not think that she is suicidal and that the scratches on her arm are from trying to fix the old vein.

## 2014-11-13 ENCOUNTER — Other Ambulatory Visit (HOSPITAL_COMMUNITY): Payer: Self-pay | Admitting: Psychiatry

## 2014-11-16 ENCOUNTER — Encounter (HOSPITAL_COMMUNITY): Payer: Self-pay | Admitting: Emergency Medicine

## 2014-11-16 ENCOUNTER — Emergency Department (HOSPITAL_COMMUNITY): Payer: Medicare Other

## 2014-11-16 ENCOUNTER — Emergency Department (HOSPITAL_COMMUNITY)
Admission: EM | Admit: 2014-11-16 | Discharge: 2014-11-16 | Disposition: A | Discharge status: Home / Self Care | Payer: Medicare Other | Attending: Emergency Medicine | Admitting: Emergency Medicine

## 2014-11-16 DIAGNOSIS — Y9289 Other specified places as the place of occurrence of the external cause: Secondary | ICD-10-CM | POA: Diagnosis not present

## 2014-11-16 DIAGNOSIS — K219 Gastro-esophageal reflux disease without esophagitis: Secondary | ICD-10-CM | POA: Diagnosis not present

## 2014-11-16 DIAGNOSIS — Z720 Tobacco use: Secondary | ICD-10-CM | POA: Insufficient documentation

## 2014-11-16 DIAGNOSIS — W010XXA Fall on same level from slipping, tripping and stumbling without subsequent striking against object, initial encounter: Secondary | ICD-10-CM | POA: Diagnosis not present

## 2014-11-16 DIAGNOSIS — Z872 Personal history of diseases of the skin and subcutaneous tissue: Secondary | ICD-10-CM | POA: Insufficient documentation

## 2014-11-16 DIAGNOSIS — Z87440 Personal history of urinary (tract) infections: Secondary | ICD-10-CM | POA: Diagnosis not present

## 2014-11-16 DIAGNOSIS — Z79899 Other long term (current) drug therapy: Secondary | ICD-10-CM | POA: Diagnosis not present

## 2014-11-16 DIAGNOSIS — F319 Bipolar disorder, unspecified: Secondary | ICD-10-CM | POA: Diagnosis not present

## 2014-11-16 DIAGNOSIS — Y9389 Activity, other specified: Secondary | ICD-10-CM | POA: Diagnosis not present

## 2014-11-16 DIAGNOSIS — G8929 Other chronic pain: Secondary | ICD-10-CM | POA: Diagnosis not present

## 2014-11-16 DIAGNOSIS — M1990 Unspecified osteoarthritis, unspecified site: Secondary | ICD-10-CM | POA: Insufficient documentation

## 2014-11-16 DIAGNOSIS — Y998 Other external cause status: Secondary | ICD-10-CM | POA: Insufficient documentation

## 2014-11-16 DIAGNOSIS — S5002XA Contusion of left elbow, initial encounter: Secondary | ICD-10-CM | POA: Diagnosis not present

## 2014-11-16 DIAGNOSIS — Z8742 Personal history of other diseases of the female genital tract: Secondary | ICD-10-CM | POA: Insufficient documentation

## 2014-11-16 DIAGNOSIS — Z91040 Latex allergy status: Secondary | ICD-10-CM | POA: Insufficient documentation

## 2014-11-16 DIAGNOSIS — S3991XA Unspecified injury of abdomen, initial encounter: Secondary | ICD-10-CM | POA: Diagnosis not present

## 2014-11-16 DIAGNOSIS — Z880 Allergy status to penicillin: Secondary | ICD-10-CM | POA: Insufficient documentation

## 2014-11-16 DIAGNOSIS — S20212A Contusion of left front wall of thorax, initial encounter: Secondary | ICD-10-CM | POA: Insufficient documentation

## 2014-11-16 DIAGNOSIS — S59902A Unspecified injury of left elbow, initial encounter: Secondary | ICD-10-CM | POA: Diagnosis present

## 2014-11-16 DIAGNOSIS — Z791 Long term (current) use of non-steroidal anti-inflammatories (NSAID): Secondary | ICD-10-CM | POA: Diagnosis not present

## 2014-11-16 DIAGNOSIS — Z7951 Long term (current) use of inhaled steroids: Secondary | ICD-10-CM | POA: Insufficient documentation

## 2014-11-16 DIAGNOSIS — Z8619 Personal history of other infectious and parasitic diseases: Secondary | ICD-10-CM | POA: Diagnosis not present

## 2014-11-16 MED ORDER — ACETAMINOPHEN 500 MG PO TABS
1000.0000 mg | ORAL_TABLET | Freq: Once | ORAL | Status: AC
  Administered 2014-11-16: 1000 mg via ORAL
  Filled 2014-11-16: qty 2

## 2014-11-16 NOTE — Discharge Instructions (Signed)
Please use your sling and apply ice for the next 3 or 4 days to your elbow. Please see Dr. Buelah Manis for follow-up and management. Elbow Contusion An elbow contusion is a deep bruise of the elbow. Contusions are the result of an injury that caused bleeding under the skin. The contusion may turn blue, purple, or yellow. Minor injuries will give you a painless contusion, but more severe contusions may stay painful and swollen for a few weeks.  CAUSES  An elbow contusion comes from a direct force to that area, such as falling on the elbow. SYMPTOMS   Swelling and redness of the elbow.  Bruising of the elbow area.  Tenderness or soreness of the elbow. DIAGNOSIS  You will have a physical exam and will be asked about your history. You may need an X-ray of your elbow to look for a broken bone (fracture).  TREATMENT  A sling or splint may be needed to support your injury. Resting, elevating, and applying cold compresses to the elbow area are often the best treatments for an elbow contusion. Over-the-counter medicines may also be recommended for pain control. HOME CARE INSTRUCTIONS   Put ice on the injured area.  Put ice in a plastic bag.  Place a towel between your skin and the bag.  Leave the ice on for 15-20 minutes, 03-04 times a day.  Only take over-the-counter or prescription medicines for pain, discomfort, or fever as directed by your caregiver.  Rest your injured elbow until the pain and swelling are better.  Elevate your elbow to reduce swelling.  Apply a compression wrap as directed by your caregiver. This can help reduce swelling and motion. You may remove the wrap for sleeping, showers, and baths. If your fingers become numb, cold, or blue, take the wrap off and reapply it more loosely.  Use your elbow only as directed by your caregiver. You may be asked to do range of motion exercises. Do them as directed.  See your caregiver as directed. It is very important to keep all  follow-up appointments in order to avoid any long-term problems with your elbow, including chronic pain or inability to move your elbow normally. SEEK IMMEDIATE MEDICAL CARE IF:   You have increased redness, swelling, or pain in your elbow.  Your swelling or pain is not relieved with medicines.  You have swelling of the hand and fingers.  You are unable to move your fingers or wrist.  You begin to lose feeling in your hand or fingers.  Your fingers or hand become cold or blue. MAKE SURE YOU:   Understand these instructions.  Will watch your condition.  Will get help right away if you are not doing well or get worse. Document Released: 03/13/2006 Document Revised: 06/27/2011 Document Reviewed: 02/18/2011 [facility] Patient Information 2015 [place], [place]. This information is not intended to replace advice given to you by your health care provider. Make sure you discuss any questions you have with your health care provider.  Rib Contusion A rib contusion (bruise) can occur by a blow to the chest or by a fall against a hard object. Usually these will be much better in a couple weeks. If X-rays were taken today and there are no broken bones (fractures), the diagnosis of bruising is made. However, broken ribs may not show up for several days, or may be discovered later on a routine X-ray when signs of healing show up. If this happens to you, it does not mean that something was missed  on the X-ray, but simply that it did not show up on the first X-rays. Earlier diagnosis will not usually change the treatment. HOME CARE INSTRUCTIONS   Avoid strenuous activity. Be careful during activities and avoid bumping the injured ribs. Activities that pull on the injured ribs and cause pain should be avoided, if possible.  For the first day or two, an ice pack used every 20 minutes while awake may be helpful. Put ice in a plastic bag and put a towel between the bag and the skin.  Eat a normal,  well-balanced diet. Drink plenty of fluids to avoid constipation.  Take deep breaths several times a day to keep lungs free of infection. Try to cough several times a day. Splint the injured area with a pillow while coughing to ease pain. Coughing can help prevent pneumonia.  Wear a rib belt or binder only if told to do so by your caregiver. If you are wearing a rib belt or binder, you must do the breathing exercises as directed by your caregiver. If not used properly, rib belts or binders restrict breathing which can lead to pneumonia.  Only take over-the-counter or prescription medicines for pain, discomfort, or fever as directed by your caregiver. SEEK MEDICAL CARE IF:   You or your child has an oral temperature above 102 F (38.9 C).  Your baby is older than 3 months with a rectal temperature of 100.5 F (38.1 C) or higher for more than 1 day.  You develop a cough, with thick or bloody sputum. SEEK IMMEDIATE MEDICAL CARE IF:   You have difficulty breathing.  You feel sick to your stomach (nausea), have vomiting or belly (abdominal) pain.  You have worsening pain, not controlled with medications, or there is a change in the location of the pain.  You develop sweating or radiation of the pain into the arms, jaw or shoulders, or become light headed or faint.  You or your child has an oral temperature above 102 F (38.9 C), not controlled by medicine.  Your or your baby is older than 3 months with a rectal temperature of 102 F (38.9 C) or higher.  Your baby is 39 months old or younger with a rectal temperature of 100.4 F (38 C) or higher. MAKE SURE YOU:   Understand these instructions.  Will watch your condition.  Will get help right away if you are not doing well or get worse. Document Released: 12/28/2000 Document Revised: 07/30/2012 Document Reviewed: 11/21/2007 [facility] Patient Information 2015 [place], [place]. This information is not intended to replace advice given to  you by your health care provider. Make sure you discuss any questions you have with your health care provider.

## 2014-11-16 NOTE — ED Provider Notes (Signed)
History  This chart was scribed for non-physician practitioner, Lily Kocher, PA-C,working with Carmin Muskrat, MD, by Marlowe Kays, ED Scribe. This patient was seen in room APFT20/APFT20 and the patient's care was started at 12:56 PM.  Chief Complaint  Patient presents with  . Fall   The history is provided by the patient and medical records. No language interpreter was used.    HPI Comments:  Annette Galvan is a 54 y.o. obese female who presents to the Emergency Department complaining of severe left flank pain and left elbow pain that began secondary to tripping and falling over a tree stump yesterday evening. She reports taking Ibuprofen 800 mg with no significant relief of the pain. Certain movements make the pain worse. She denies alleviating factors. Denies fever, chills, numbness, tingling or weakness of the lower extremities, nausea, vomiting, head injury or LOC.  Past Medical History  Diagnosis Date  . Bipolar 1 disorder   . Back pain, chronic   . Leg pain   . HSV-2 (herpes simplex virus 2) infection 2013  . Bilateral chronic knee pain   . Fibromyalgia   . Hx of trichomoniasis   . Arthritis     needs 2 knee replacements  . Bulging disc   . UTI (lower urinary tract infection) 10/12/2012  . GERD (gastroesophageal reflux disease)   . Vaginal odor 12/28/2012    Had discharge +clue  . Yeast infection 02/04/2013  . Vaginal discharge 06/26/2013  . Trichimoniasis 06/26/2013  . Itching 06/26/2013  . Vaginal irritation 12/05/2013   Past Surgical History  Procedure Laterality Date  . Abdominal hysterectomy    . Breast enhancement surgery    . Tubal ligation     Family History  Problem Relation Age of Onset  . Hypertension Mother   . Diabetes Mother   . Depression Mother   . Hyperlipidemia Mother   . Fibromyalgia Mother   . Arthritis Mother   . Cancer Father     bone  . Hyperlipidemia Father   . Hypertension Father   . Bipolar disorder Father   . Depression Sister    . Hyperlipidemia Sister   . Fibromyalgia Sister   . Fibromyalgia Sister   . Colon cancer Neg Hx   . Bipolar disorder Other   . Drug abuse Other   . Alcohol abuse Other   . Hypertension Brother   . Other Brother     shingles; back problems  . Diabetes Paternal Grandmother   . Alzheimer's disease Maternal Grandmother   . Obesity Daughter   . Other Daughter     back problems  . Bipolar disorder Daughter   . Depression Daughter   . Other Daughter     on pain meds   History  Substance Use Topics  . Smoking status: Light Tobacco Smoker -- 0.25 packs/day    Types: Cigarettes  . Smokeless tobacco: Never Used  . Alcohol Use: 0.0 oz/week    0 Standard drinks or equivalent per week     Comment: beer occasionally   OB History    Gravida Para Term Preterm AB TAB SAB Ectopic Multiple Living   2 2        2      Review of Systems  Constitutional: Negative for fever and chills.  Gastrointestinal: Negative for nausea and vomiting.  Skin: Positive for color change.  Neurological: Negative for syncope, weakness and numbness.  All other systems reviewed and are negative.   Allergies  Latex and Ampicillin  Home  Medications   Prior to Admission medications   Medication Sig Start Date End Date Taking? Authorizing Provider  ALPRAZolam Duanne Moron) 1 MG tablet Take 1 tablet (1 mg total) by mouth 4 (four) times daily as needed for anxiety. 08/27/14  Yes Cloria Spring, MD  cetirizine (ZYRTEC) 10 MG tablet TAKE 1 TABLET BY MOUTH ONCE DAILY. 10/24/14  Yes Alycia Rossetti, MD  diclofenac sodium (VOLTAREN) 1 % GEL Apply 2 g topically 4 (four) times daily. 04/04/14  Yes Alycia Rossetti, MD  fluticasone (FLONASE) 50 MCG/ACT nasal spray INHALE 2 SPRAYS IN EACH NOSTRIL ONCE DAILY AS DIRECTED. 07/10/14  Yes Alycia Rossetti, MD  gabapentin (NEURONTIN) 400 MG capsule Take 1 capsule (400 mg total) by mouth 3 (three) times daily. 08/27/14  Yes Cloria Spring, MD  ibuprofen (ADVIL,MOTRIN) 800 MG tablet TAKE  ONE TABLET BY MOUTH TWICE DAILY AS NEEDED PAIN. 08/04/14  Yes Alycia Rossetti, MD  lamoTRIgine (LAMICTAL) 100 MG tablet Take 1 tablet (100 mg total) by mouth 2 (two) times daily. 08/27/14  Yes Cloria Spring, MD  omeprazole (PRILOSEC) 40 MG capsule Take 1 capsule (40 mg total) by mouth daily. 11/12/14  Yes Alycia Rossetti, MD  traZODone (DESYREL) 150 MG tablet Take 1 tablet (150 mg total) by mouth at bedtime. 11/12/14  Yes Alycia Rossetti, MD   Triage Vitals: BP 139/74 mmHg  Pulse 75  Temp(Src) 97.5 F (36.4 C) (Oral)  Resp 14  Ht 5\' 4"  (1.626 m)  Wt 282 lb (127.914 kg)  BMI 48.38 kg/m2  SpO2 99% Physical Exam  Constitutional: She is oriented to person, place, and time. She appears well-developed and well-nourished.  HENT:  Head: Normocephalic and atraumatic.  Eyes: EOM are normal.  Neck: Normal range of motion.  Cardiovascular: Normal rate.   Pulmonary/Chest: Effort normal.  Pain to left ribs. No palpable hematoma. Symmetrical rise and fall of the chest.  Musculoskeletal: Normal range of motion. She exhibits tenderness.  Bruise to lateral aspect of left elbow. Abrasion noted to left elbow. Pain to palpation over bruised area. Full ROM of left shoulder.  Neurological: She is alert and oriented to person, place, and time.  Skin: Skin is warm and dry.  Psychiatric: She has a normal mood and affect. Her behavior is normal.  Nursing note and vitals reviewed.   ED Course  Procedures (including critical care time) DIAGNOSTIC STUDIES: Oxygen Saturation is 99% on RA, normal by my interpretation.   COORDINATION OF CARE: 12:59 PM- Informed pt that X-Rays were indicated at this time but pt has to leave due to transportation issues. Offered to give pt sling and follow up with PCP as soon as possible for X-Rays. Will order sling and Tylenol prior to discharge. Pt verbalizes understanding and agrees to plan.  Medications - No data to display  Labs Review Labs Reviewed - No data to  display  Imaging Review No results found.   EKG Interpretation None      MDM  Pt sustained injury to the left rib anea and left elbow. Plan for xrays  And exam explained to the pt. Pt stated that her ride was ready to go and she did not want to stay any longer. Pt fitted with sling. Discussed the need for additional evaluation and work up up. Pt invited to return to ED if she changes her mind about work up.   Final diagnoses:  Contusion of ribs, left, initial encounter  Contusion of left elbow, initial encounter    *  I have reviewed nursing notes, vital signs, and all appropriate lab and imaging results for this patient.  I personally performed the services described in this documentation, which was scribed in my presence. The recorded information has been reviewed and is accurate.    Lily Kocher, PA-C 11/18/14 2051  Carmin Muskrat, MD 11/19/14 [phone]

## 2014-11-16 NOTE — ED Notes (Signed)
Pt reports she fell last night. Pt reports left elbow pain and left back/ hip pain. Pt reports she took 800mg  ibuprofen with no relief.

## 2014-11-27 ENCOUNTER — Ambulatory Visit (HOSPITAL_COMMUNITY): Payer: Self-pay | Admitting: Psychiatry

## 2014-11-28 ENCOUNTER — Encounter (HOSPITAL_COMMUNITY): Payer: Self-pay | Admitting: Psychiatry

## 2014-11-28 ENCOUNTER — Ambulatory Visit (INDEPENDENT_AMBULATORY_CARE_PROVIDER_SITE_OTHER): Payer: Medicare Other | Admitting: Psychiatry

## 2014-11-28 VITALS — BP 146/78 | HR 88 | Ht 64.0 in | Wt 283.0 lb

## 2014-11-28 DIAGNOSIS — F4310 Post-traumatic stress disorder, unspecified: Secondary | ICD-10-CM

## 2014-11-28 MED ORDER — TRAZODONE HCL 150 MG PO TABS: 150.0000 mg | ORAL_TABLET | Freq: Every day | ORAL | Status: DC

## 2014-11-28 MED ORDER — ALPRAZOLAM 1 MG PO TABS: 1.0000 mg | ORAL_TABLET | Freq: Four times a day (QID) | ORAL | Status: DC | PRN

## 2014-11-28 MED ORDER — LAMOTRIGINE 100 MG PO TABS: 100.0000 mg | ORAL_TABLET | Freq: Two times a day (BID) | ORAL | Status: DC

## 2014-11-28 MED ORDER — GABAPENTIN 400 MG PO CAPS: 400.0000 mg | ORAL_CAPSULE | Freq: Three times a day (TID) | ORAL | Status: DC
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
## 2014-11-29 ENCOUNTER — Other Ambulatory Visit (HOSPITAL_COMMUNITY): Payer: Self-pay | Admitting: Psychiatry

## 2014-12-10 ENCOUNTER — Telehealth: Payer: Self-pay | Admitting: Family Medicine

## 2014-12-10 NOTE — Telephone Encounter (Signed)
Call placed to patient. [place].  Will advise to defer to psychiatry for medication management.

## 2014-12-10 NOTE — Telephone Encounter (Signed)
Patient is calling to say that she needs her trazadone increased to the original dosage  [phone]

## 2014-12-11 ENCOUNTER — Telehealth (HOSPITAL_COMMUNITY): Payer: Self-pay | Admitting: *Deleted

## 2014-12-11 NOTE — Telephone Encounter (Signed)
phone call from patient, she would like her Trazodone to be increased to the normal dosage.    The Remeron made her angry and frustrated.  Her primary care doctor gave her thirty days of Trazodone.

## 2014-12-11 NOTE — Telephone Encounter (Signed)
Call placed to patient and patient made aware.  

## 2014-12-15 ENCOUNTER — Ambulatory Visit (HOSPITAL_COMMUNITY): Payer: Self-pay | Admitting: Psychiatry

## 2014-12-16 ENCOUNTER — Other Ambulatory Visit (HOSPITAL_COMMUNITY): Payer: Self-pay | Admitting: Psychiatry

## 2014-12-16 MED ORDER — TRAZODONE HCL 150 MG PO TABS: 300.0000 mg | ORAL_TABLET | Freq: Every day | ORAL | Status: DC

## 2014-12-16 NOTE — Telephone Encounter (Signed)
Called pt back due to previous phone call. Per pt, she would like to know if Dr. Harrington Challenger could increase her Trazodone back to BID instead of QD. Per pt, her PCP did that and she would like to go back to her normal dosage of BID. Pt number is [phone]

## 2014-12-16 NOTE — Telephone Encounter (Signed)
sent 

## 2014-12-18 ENCOUNTER — Telehealth: Payer: Self-pay | Admitting: Family Medicine

## 2014-12-18 NOTE — Telephone Encounter (Signed)
Patient calling to see if you can sent rx to [facility] for shower chair with a back on it if possible  [phone]

## 2014-12-18 NOTE — Telephone Encounter (Signed)
Ok to order 

## 2014-12-19 NOTE — Telephone Encounter (Signed)
Okay to order?

## 2014-12-19 NOTE — Telephone Encounter (Signed)
Prescription faxed.   Call placed to patient and patient made aware.

## 2014-12-25 ENCOUNTER — Ambulatory Visit: Payer: Self-pay | Admitting: Adult Health

## 2015-01-06 ENCOUNTER — Other Ambulatory Visit: Payer: Self-pay | Admitting: Family Medicine

## 2015-01-06 NOTE — Telephone Encounter (Signed)
Medication refilled per protocol. 

## 2015-02-02 ENCOUNTER — Other Ambulatory Visit (HOSPITAL_COMMUNITY): Payer: Self-pay | Admitting: Psychiatry

## 2015-02-02 ENCOUNTER — Other Ambulatory Visit: Payer: Self-pay | Admitting: Family Medicine

## 2015-02-02 NOTE — Telephone Encounter (Signed)
Refill appropriate and filled per protocol. 

## 2015-02-23 ENCOUNTER — Encounter (HOSPITAL_COMMUNITY): Payer: Self-pay | Admitting: Psychiatry

## 2015-02-23 ENCOUNTER — Ambulatory Visit (INDEPENDENT_AMBULATORY_CARE_PROVIDER_SITE_OTHER): Payer: Medicare Other | Admitting: Psychiatry

## 2015-02-23 VITALS — BP 147/82 | HR 74 | Ht 64.0 in | Wt 280.0 lb

## 2015-02-23 DIAGNOSIS — F4310 Post-traumatic stress disorder, unspecified: Secondary | ICD-10-CM | POA: Diagnosis not present

## 2015-02-23 MED ORDER — GABAPENTIN 400 MG PO CAPS: 400.0000 mg | ORAL_CAPSULE | Freq: Three times a day (TID) | ORAL | Status: DC

## 2015-02-23 MED ORDER — TRAZODONE HCL 150 MG PO TABS: 300.0000 mg | ORAL_TABLET | Freq: Every day | ORAL | Status: DC

## 2015-02-23 MED ORDER — ALPRAZOLAM 1 MG PO TABS: 1.0000 mg | ORAL_TABLET | Freq: Four times a day (QID) | ORAL | Status: DC | PRN

## 2015-02-23 MED ORDER — LAMOTRIGINE 100 MG PO TABS: 100.0000 mg | ORAL_TABLET | Freq: Two times a day (BID) | ORAL | Status: DC
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
## 2015-02-27 ENCOUNTER — Ambulatory Visit (HOSPITAL_COMMUNITY): Payer: Self-pay | Admitting: Psychiatry

## 2015-03-05 ENCOUNTER — Other Ambulatory Visit: Payer: Self-pay | Admitting: Family Medicine

## 2015-03-05 NOTE — Telephone Encounter (Signed)
Medication refilled per protocol. 

## 2015-03-23 ENCOUNTER — Other Ambulatory Visit: Payer: Self-pay | Admitting: Family Medicine

## 2015-03-23 NOTE — Telephone Encounter (Signed)
Refill appropriate and filled per protocol. 

## 2015-04-01 ENCOUNTER — Other Ambulatory Visit: Payer: Self-pay | Admitting: Family Medicine

## 2015-04-30 ENCOUNTER — Other Ambulatory Visit: Payer: Self-pay | Admitting: Family Medicine

## 2015-04-30 NOTE — Telephone Encounter (Signed)
Refill appropriate and filled per protocol. 

## 2015-05-26 ENCOUNTER — Other Ambulatory Visit (HOSPITAL_COMMUNITY): Payer: Self-pay | Admitting: Psychiatry

## 2015-05-26 ENCOUNTER — Encounter (HOSPITAL_COMMUNITY): Payer: Self-pay | Admitting: Psychiatry

## 2015-05-26 ENCOUNTER — Ambulatory Visit (INDEPENDENT_AMBULATORY_CARE_PROVIDER_SITE_OTHER): Payer: Medicare Other | Admitting: Psychiatry

## 2015-05-26 VITALS — BP 171/87 | HR 76 | Ht 64.0 in | Wt 282.0 lb

## 2015-05-26 DIAGNOSIS — F4310 Post-traumatic stress disorder, unspecified: Secondary | ICD-10-CM

## 2015-05-26 MED ORDER — TRAZODONE HCL 150 MG PO TABS: 300.0000 mg | ORAL_TABLET | Freq: Every day | ORAL | Status: DC

## 2015-05-26 MED ORDER — ALPRAZOLAM 1 MG PO TABS: 1.0000 mg | ORAL_TABLET | Freq: Four times a day (QID) | ORAL | Status: DC | PRN

## 2015-05-26 MED ORDER — GABAPENTIN 400 MG PO CAPS: 400.0000 mg | ORAL_CAPSULE | Freq: Three times a day (TID) | ORAL | Status: DC

## 2015-05-26 MED ORDER — LAMOTRIGINE 100 MG PO TABS: 100.0000 mg | ORAL_TABLET | Freq: Two times a day (BID) | ORAL | Status: DC
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
## 2015-05-28 ENCOUNTER — Telehealth: Payer: Self-pay | Admitting: *Deleted

## 2015-05-28 NOTE — Telephone Encounter (Signed)
Received request from pharmacy for PA on Voltaren gel.  PA submitted.   Dx: M17.0

## 2015-05-29 NOTE — Telephone Encounter (Signed)
Received PA determination.   PA approved through 04/17/2016.  Ref # [id number].  Pharmacy made aware.

## 2015-06-01 ENCOUNTER — Ambulatory Visit (INDEPENDENT_AMBULATORY_CARE_PROVIDER_SITE_OTHER): Payer: Medicare Other | Admitting: Family Medicine

## 2015-06-01 ENCOUNTER — Other Ambulatory Visit: Payer: Self-pay | Admitting: Family Medicine

## 2015-06-01 ENCOUNTER — Encounter: Payer: Self-pay | Admitting: Family Medicine

## 2015-06-01 VITALS — BP 118/80 | HR 98 | Temp 97.3°F | Resp 18 | Ht 64.0 in | Wt 283.0 lb

## 2015-06-01 DIAGNOSIS — E039 Hypothyroidism, unspecified: Secondary | ICD-10-CM | POA: Diagnosis not present

## 2015-06-01 DIAGNOSIS — N390 Urinary tract infection, site not specified: Secondary | ICD-10-CM | POA: Diagnosis not present

## 2015-06-01 DIAGNOSIS — M170 Bilateral primary osteoarthritis of knee: Secondary | ICD-10-CM

## 2015-06-01 DIAGNOSIS — J0100 Acute maxillary sinusitis, unspecified: Secondary | ICD-10-CM

## 2015-06-01 DIAGNOSIS — Z0000 Encounter for general adult medical examination without abnormal findings: Secondary | ICD-10-CM

## 2015-06-01 DIAGNOSIS — Z113 Encounter for screening for infections with a predominantly sexual mode of transmission: Secondary | ICD-10-CM

## 2015-06-01 DIAGNOSIS — G4730 Sleep apnea, unspecified: Secondary | ICD-10-CM | POA: Diagnosis not present

## 2015-06-01 DIAGNOSIS — R7309 Other abnormal glucose: Secondary | ICD-10-CM | POA: Diagnosis not present

## 2015-06-01 DIAGNOSIS — R7303 Prediabetes: Secondary | ICD-10-CM | POA: Diagnosis not present

## 2015-06-01 DIAGNOSIS — Z1211 Encounter for screening for malignant neoplasm of colon: Secondary | ICD-10-CM | POA: Diagnosis not present

## 2015-06-01 DIAGNOSIS — R946 Abnormal results of thyroid function studies: Secondary | ICD-10-CM | POA: Diagnosis not present

## 2015-06-01 DIAGNOSIS — Z1159 Encounter for screening for other viral diseases: Secondary | ICD-10-CM

## 2015-06-01 DIAGNOSIS — R109 Unspecified abdominal pain: Secondary | ICD-10-CM | POA: Diagnosis not present

## 2015-06-01 LAB — LIPID PANEL
CHOLESTEROL: 182 mg/dL (ref 125–200)
HDL: 57 mg/dL (ref >=46)
LDL CALC: 101 mg/dL (ref <130)
TRIGLYCERIDES: 121 mg/dL (ref <150)
Total CHOL/HDL Ratio: 3.2 Ratio (ref <=5.0)
VLDL: 24 mg/dL (ref <30)

## 2015-06-01 LAB — CBC WITH DIFFERENTIAL/PLATELET
Basophils Absolute: 0 10*3/uL (ref 0.0–0.1)
Basophils Relative: 0 % (ref 0–1)
EOS ABS: 0.1 10*3/uL (ref 0.0–0.7)
EOS PCT: 1 % (ref 0–5)
HCT: 41.8 % (ref 36.0–46.0)
Hemoglobin: 13.4 g/dL (ref 12.0–15.0)
Lymphocytes Relative: 21 % (ref 12–46)
Lymphs Abs: 1.7 10*3/uL (ref 0.7–4.0)
MCH: 29.7 pg (ref 26.0–34.0)
MCHC: 32.1 g/dL (ref 30.0–36.0)
MCV: 92.7 fL (ref 78.0–100.0)
MONO ABS: 0.7 10*3/uL (ref 0.1–1.0)
MONOS PCT: 8 % (ref 3–12)
MPV: 9.2 fL (ref 8.6–12.4)
NEUTROS PCT: 70 % (ref 43–77)
Neutro Abs: 5.8 10*3/uL (ref 1.7–7.7)
PLATELETS: 268 10*3/uL (ref 150–400)
RBC: 4.51 MIL/uL (ref 3.87–5.11)
RDW: 14 % (ref 11.5–15.5)
WBC: 8.3 10*3/uL (ref 4.0–10.5)

## 2015-06-01 LAB — TSH: TSH: 0.22 m[IU]/L — AB

## 2015-06-01 LAB — URINALYSIS, MICROSCOPIC ONLY
BACTERIA UA: NONE SEEN [HPF]
Casts: NONE SEEN [LPF]
Crystals: NONE SEEN [HPF]
WBC UA: NONE SEEN WBC/HPF (ref <=5)
Yeast: NONE SEEN [HPF]

## 2015-06-01 LAB — WET PREP FOR TRICH, YEAST, CLUE
CLUE CELLS WET PREP: NONE SEEN
Trich, Wet Prep: NONE SEEN
Yeast Wet Prep HPF POC: NONE SEEN

## 2015-06-01 LAB — COMPREHENSIVE METABOLIC PANEL
ALT: 12 U/L (ref 6–29)
AST: 15 U/L (ref 10–35)
Albumin: 3.8 g/dL (ref 3.6–5.1)
Alkaline Phosphatase: 70 U/L (ref 33–130)
BUN: 19 mg/dL (ref 7–25)
CHLORIDE: 102 mmol/L (ref 98–110)
CO2: 28 mmol/L (ref 20–31)
CREATININE: 0.67 mg/dL (ref 0.50–1.05)
Calcium: 8.9 mg/dL (ref 8.6–10.4)
Glucose, Bld: 86 mg/dL (ref 70–99)
POTASSIUM: 4.3 mmol/L (ref 3.5–5.3)
SODIUM: 140 mmol/L (ref 135–146)
Total Bilirubin: 0.3 mg/dL (ref 0.2–1.2)
Total Protein: 7 g/dL (ref 6.1–8.1)

## 2015-06-01 LAB — URINALYSIS, ROUTINE W REFLEX MICROSCOPIC
Bilirubin Urine: NEGATIVE
GLUCOSE, UA: NEGATIVE
KETONES UR: NEGATIVE
LEUKOCYTES UA: NEGATIVE
Nitrite: NEGATIVE
PROTEIN: NEGATIVE
Specific Gravity, Urine: 1.025 (ref 1.001–1.035)
pH: 6 (ref 5.0–8.0)

## 2015-06-01 LAB — HEMOGLOBIN A1C
Hgb A1c MFr Bld: 5.7 % — ABNORMAL HIGH (ref <5.7)
Mean Plasma Glucose: 117 mg/dL — ABNORMAL HIGH (ref <117)

## 2015-06-01 LAB — HIV ANTIBODY (ROUTINE TESTING W REFLEX): HIV 1&2 Ab, 4th Generation: NONREACTIVE

## 2015-06-01 MED ORDER — AZITHROMYCIN 250 MG PO TABS: ORAL_TABLET | ORAL | Status: DC

## 2015-06-01 MED ORDER — FLUTICASONE PROPIONATE 50 MCG/ACT NA SUSP
2.0000 | Freq: Two times a day (BID) | NASAL | Status: DC | PRN
Start: 2015-06-01 — End: 2015-10-06

## 2015-06-01 MED ORDER — ONDANSETRON HCL 4 MG PO TABS: 4.0000 mg | ORAL_TABLET | Freq: Three times a day (TID) | ORAL | Status: DC | PRN

## 2015-06-01 NOTE — Progress Notes (Signed)
Patient ID: Annette Galvan, female   DOB: 10/01/1960, 55 y.o.   MRN: [id number]  Subjective:   Patient presents for Medicare Annual/Subsequent preventive examination.  Issue here for annual wellness exam. She has multiple concerns. She did have unprotected sex, like complete STD screening. She has had some abdominal pain on and off associated with some nausea. She is status post hysterectomy has not had any blood from the vagina. She has noticed that her urine has been darker and she's had some mild burning on and off.  Sinus pressure /ear pain/ drainage for the past couple weeks. She is a smoker. She noticed some wheezing at nighttime but is mostly when she goes to blow. She is not taking anything over-the-counter but she does use Flonase which was prescribed. No fever.   Like to be referred for her chronic knee pain. She has known severe osteophytes of the knees they have discussed knee surgery but with her habitus and her mental state this has been put off. She would like to have injections in her knees which are helped in the past. She also has chronic back pain from known degenerative disc disease bulging disc.  Sleep apnea has history of sleep apnea she never used to see Pap would like to proceed with using the machine    Review Past Medical/Family/Social: Per EMR    Risk Factors  Current exercise habits: None  Dietary issues discussed: Yes  Cardiac risk factors: Obesity (BMI >= 30 kg/m2). Borderline DM  Depression Screen  - CURRENT TREATMENT BY PSYCHIATRY (Note: if answer to either of the following is "Yes", a more complete depression screening is indicated)  Over the past two weeks, have you felt down, depressed or hopeless? No Over the past two weeks, have you felt little interest or pleasure in doing things? No Have you lost interest or pleasure in daily life? No Do you often feel hopeless? No Do you cry easily over simple problems? No   Activities of Daily Living - HAS CAP  SERVICES- Aide In your present state of health, do you have any difficulty performing the following activities?:  Driving? No  Managing money? No  Feeding yourself? No  Getting from bed to chair? yes  Climbing a flight of stairs? yes Preparing food and eating?: No  Bathing or showering? yes  Getting dressed: yes Getting to the toilet? yes Using the toilet:No  Moving around from place to place: YES In the past year have you fallen or had a near fall?:No  Are you sexually active? YES  Do you have more than one partner? No   Hearing Difficulties: No  Do you often ask people to speak up or repeat themselves? YES  Do you experience ringing or noises in your ears? YES Do you have difficulty understanding soft or whispered voices? No  Do you feel that you have a problem with memory? No Do you often misplace items? No  Do you feel safe at home? Yes  Cognitive Testing  Alert? Yes Normal Appearance?Yes  Oriented to person? Yes Place? Yes  Time? Yes  Recall of three objects? Yes  Can perform simple calculations? Yes  Displays appropriate judgment?Yes  Can read the correct time from a watch face?Yes   List the Names of Other Physician/Practitioners you currently use:   Pyschiatry    Screening Tests / Date Colonoscopy      - DUE                Zostavax -  AGE 74  Mammogram - OVERDUE  Influenza Vaccine - UTD Tetanus/tdap UTD  ROS: GEN- denies fatigue, fever, weight loss,weakness, recent illness HEENT- denies eye drainage, change in vision, nasal discharge, CVS- denies chest pain, palpitations RESP- denies SOB, cough, wheeze ABD- denies N/V, change in stools, abd pain GU- denies dysuria, hematuria, dribbling, incontinence MSK- + joint pain, muscle aches, injury Neuro- denies headache, dizziness, syncope, seizure activity  PHYSICAL: GEN- NAD, alert and oriented x3 HEENT- PERRL, EOMI, non injected sclera, pink conjunctiva, MMM, oropharynx clear, nares clear rhinorrhea,  +maxillary sinus tenderness, TM clear no wheeze  Neck- Supple, no thryomegaly CVS- RRR, no murmur RESP-CTAB Breast- normal symmetry (implant right side), no nipple inversion,no nipple drainage, no nodules or lumps felt Nodes- no axillary nodes ABD-NABS,soft,NT,NDno CVA tenderness  GU- normal external genitalia, vaginal mucosa pink and moist, cervix visualized no growth, no blood form os,+discharge, no CMT, no ovarian masses, uterus normal size EXT- No edema Pulses- Radial, DP- 2+     Assessment:    Annual wellness medicare exam   Plan:    During the course of the visit the patient was educated and counseled about appropriate screening and preventive services including:  Screening mammography  Colorectal cancer screening   Acute sinusitis- Given Zpak, flonase, mucinex  Dysuria- no Sign of UTI, cultures done for STD screen and  Vaginal discharge, for the nausea continue PPI, I will also give some Zofran   Screen + for depression. Treated by psychiatry currently  Pt has Bippolar Depression    Diet review for nutrition referral? Yes ____ Not Indicated __x__  Patient Instructions (the written plan) was given to the patient.  Medicare Attestation  I have personally reviewed:  The patient's medical and social history  Their use of alcohol, tobacco or illicit drugs  Their current medications and supplements  The patient's functional ability including ADLs,fall risks, home safety risks, cognitive, and hearing and visual impairment  Diet and physical activities  Evidence for depression or mood disorders  The patient's weight, height, BMI, and visual acuity have been recorded in the chart. I have made referrals, counseling, and provided education to the patient based on review of the above and I have provided the patient with a written personalized care plan for preventive services.

## 2015-06-01 NOTE — Assessment & Plan Note (Signed)
On chronic NSAIDS, send to Ortho for injections due to habitus and size of knee

## 2015-06-01 NOTE — Assessment & Plan Note (Signed)
Recheck A1C 

## 2015-06-01 NOTE — Assessment & Plan Note (Signed)
Referral for sleep study and treatment with CPAP

## 2015-06-01 NOTE — Patient Instructions (Addendum)
Get your Mammogram Referral for colonoscopy  Referral to Dr. Aline Brochure for knee injections Referral to Dr. Luan Pulling for sleep apnea  F/U 6 months

## 2015-06-02 ENCOUNTER — Other Ambulatory Visit: Payer: Self-pay | Admitting: Family Medicine

## 2015-06-02 ENCOUNTER — Telehealth: Payer: Self-pay | Admitting: *Deleted

## 2015-06-02 ENCOUNTER — Telehealth: Payer: Self-pay | Admitting: Family Medicine

## 2015-06-02 DIAGNOSIS — R922 Inconclusive mammogram: Secondary | ICD-10-CM

## 2015-06-02 LAB — GC/CHLAMYDIA PROBE AMP
CT Probe RNA: NOT DETECTED
GC PROBE AMP APTIMA: NOT DETECTED

## 2015-06-02 LAB — HEPATITIS C ANTIBODY: HCV Ab: NEGATIVE

## 2015-06-02 NOTE — Telephone Encounter (Signed)
You can refill but she will have to pay out of pocket insurance will not cover

## 2015-06-02 NOTE — Telephone Encounter (Signed)
Patient called would like a referral to the pain clinic and sleep disorder she would like someone in [place]. Patient would like anyone except Dr. Merlene Laughter.  She states that her and Dr. Buelah Manis discussed this yesterday and she had declined at that time now would like a referral. [phone]

## 2015-06-02 NOTE — Telephone Encounter (Signed)
Pt called stating that she had picked up her RX's gabapentin and Zpak yesterday and accidentally left them in her daughters car, daughter cleaned out car threw away the medications. Wants to know what she needs to do now. "states she is sorry and she knew better" Please advise

## 2015-06-03 ENCOUNTER — Other Ambulatory Visit: Payer: Self-pay | Admitting: Family Medicine

## 2015-06-03 LAB — T4, FREE: FREE T4: 0.9 ng/dL (ref 0.8–1.8)

## 2015-06-03 LAB — T3, FREE: T3 FREE: 3.1 pg/mL (ref 2.3–4.2)

## 2015-06-03 MED ORDER — AZITHROMYCIN 250 MG PO TABS: ORAL_TABLET | ORAL | Status: DC

## 2015-06-03 MED ORDER — GABAPENTIN 400 MG PO CAPS
400.0000 mg | ORAL_CAPSULE | Freq: Three times a day (TID) | ORAL | Status: DC
Start: 2015-06-03 — End: 2015-07-17

## 2015-06-03 NOTE — Telephone Encounter (Signed)
Refill appropriate and filled per protocol. 

## 2015-06-03 NOTE — Telephone Encounter (Signed)
Pt called and stated that she DOES want to go to [facility] now after some reconsideration

## 2015-06-03 NOTE — Telephone Encounter (Signed)
Pt aware will call in some refills but would have to pay out of pocket

## 2015-06-03 NOTE — Telephone Encounter (Signed)
Prescriptions sent into pharmacy,gabapentin only refilled for 30 days only.

## 2015-06-04 ENCOUNTER — Other Ambulatory Visit: Payer: Self-pay | Admitting: *Deleted

## 2015-06-04 DIAGNOSIS — R7989 Other specified abnormal findings of blood chemistry: Secondary | ICD-10-CM

## 2015-06-05 ENCOUNTER — Other Ambulatory Visit: Payer: Self-pay | Admitting: Family Medicine

## 2015-06-05 DIAGNOSIS — G8929 Other chronic pain: Secondary | ICD-10-CM

## 2015-06-05 DIAGNOSIS — R109 Unspecified abdominal pain: Principal | ICD-10-CM

## 2015-06-05 DIAGNOSIS — R922 Inconclusive mammogram: Secondary | ICD-10-CM

## 2015-06-05 DIAGNOSIS — G894 Chronic pain syndrome: Secondary | ICD-10-CM

## 2015-06-05 NOTE — Telephone Encounter (Signed)
Pt wants to go to [facility] for Sleep study and pain mgmt in  [place]

## 2015-06-05 NOTE — Telephone Encounter (Signed)
She has already been referred for sleep apnea- pulmonary referral Okay to send pain clinic referral, see chart for diagnosis

## 2015-06-05 NOTE — Telephone Encounter (Signed)
?  ok to referral pt to Sleep medicine and pain mgmt

## 2015-06-09 ENCOUNTER — Encounter (HOSPITAL_COMMUNITY): Payer: Self-pay

## 2015-06-16 ENCOUNTER — Encounter (HOSPITAL_COMMUNITY): Payer: Self-pay | Admitting: Psychiatry

## 2015-06-16 ENCOUNTER — Ambulatory Visit (INDEPENDENT_AMBULATORY_CARE_PROVIDER_SITE_OTHER): Payer: Medicare Other | Admitting: Psychiatry

## 2015-06-16 ENCOUNTER — Telehealth (HOSPITAL_COMMUNITY): Payer: Self-pay | Admitting: *Deleted

## 2015-06-16 DIAGNOSIS — F4310 Post-traumatic stress disorder, unspecified: Secondary | ICD-10-CM
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
## 2015-06-16 NOTE — Patient Instructions (Signed)
Discussed orally 

## 2015-06-17 ENCOUNTER — Telehealth: Payer: Self-pay

## 2015-06-17 NOTE — Telephone Encounter (Signed)
Pt received a triage letter from [facility]. Please call [phone]

## 2015-06-17 NOTE — Telephone Encounter (Signed)
See separate triage.  

## 2015-06-18 ENCOUNTER — Telehealth: Payer: Self-pay | Admitting: Family Medicine

## 2015-06-18 ENCOUNTER — Other Ambulatory Visit (HOSPITAL_COMMUNITY): Payer: Self-pay | Admitting: Respiratory Therapy

## 2015-06-18 ENCOUNTER — Telehealth: Payer: Self-pay | Admitting: *Deleted

## 2015-06-18 DIAGNOSIS — G4730 Sleep apnea, unspecified: Secondary | ICD-10-CM

## 2015-06-18 NOTE — Telephone Encounter (Signed)
PT was referred by Dr. Buelah Manis for a screening colonoscopy. Said she was previously scheduled for one and forgot and ate. She said she has a hard place on left side of her stomach. Also, needs OV due to meds. Ov scheduled with Walden Field, NP on 07/06/2015 at 1:30 PM.

## 2015-06-18 NOTE — Telephone Encounter (Signed)
Pt aware of appt.

## 2015-06-18 NOTE — Telephone Encounter (Signed)
Pt has appt scheduled for March 30 at 8pm at [facility],pt is to arrive thru the Emergency rm dept to register. LMTRC to pt

## 2015-06-18 NOTE — Telephone Encounter (Signed)
Patient would like to speak to you regarding her thyroid levels and how tired she is  [phone] (H)

## 2015-06-18 NOTE — Telephone Encounter (Signed)
Call placed to patient.   Reports that she is extremely exhausted and having cold chills and fever.   Advised that this is not thyroid related, she may be having an acute illness.   Advised to schedule OV if Sx persist x1 week.

## 2015-06-29 ENCOUNTER — Telehealth: Payer: Self-pay | Admitting: *Deleted

## 2015-06-29 ENCOUNTER — Other Ambulatory Visit: Payer: Self-pay | Admitting: Family Medicine

## 2015-06-29 NOTE — Telephone Encounter (Signed)
Ok to refill 

## 2015-06-29 NOTE — Telephone Encounter (Signed)
Refill appropriate and filled per protocol. 

## 2015-06-29 NOTE — Telephone Encounter (Signed)
Pt states she has been going to gym more regularly and has been having some muscle cramps in stomach and legs and wants to know if you could call in something for her to help with the spasms.   [employer] back number [phone]

## 2015-06-30 NOTE — Telephone Encounter (Signed)
Call placed to patient. LMTRC.  

## 2015-06-30 NOTE — Telephone Encounter (Signed)
Pt called back and aware of message. °

## 2015-06-30 NOTE — Telephone Encounter (Signed)
I do not recommend muscle spasm medication from workouts I recommend applying heat and stretching These medications only work well for actual injuries  Also increase water intake

## 2015-06-30 NOTE — Telephone Encounter (Signed)
See if she completed the 21mg , if so down step to 14mg 

## 2015-06-30 NOTE — Telephone Encounter (Signed)
lmtrc

## 2015-07-03 ENCOUNTER — Other Ambulatory Visit: Payer: Self-pay | Admitting: Family Medicine

## 2015-07-06 ENCOUNTER — Encounter: Payer: Self-pay | Admitting: Nurse Practitioner

## 2015-07-06 ENCOUNTER — Ambulatory Visit (INDEPENDENT_AMBULATORY_CARE_PROVIDER_SITE_OTHER): Payer: Medicare Other | Admitting: Nurse Practitioner

## 2015-07-06 VITALS — BP 143/87 | HR 78 | Temp 98.4°F | Ht 64.0 in | Wt 282.0 lb

## 2015-07-06 DIAGNOSIS — F1921 Other psychoactive substance dependence, in remission: Secondary | ICD-10-CM

## 2015-07-06 DIAGNOSIS — Z87898 Personal history of other specified conditions: Secondary | ICD-10-CM

## 2015-07-06 DIAGNOSIS — Z1211 Encounter for screening for malignant neoplasm of colon: Secondary | ICD-10-CM | POA: Diagnosis not present

## 2015-07-06 DIAGNOSIS — F1991 Other psychoactive substance use, unspecified, in remission: Secondary | ICD-10-CM

## 2015-07-06 NOTE — Assessment & Plan Note (Signed)
Overdue for first-ever screening colonoscopy. Was previously scheduled last year but ate after starting prep and subsequently had to cancel. She now presents for rescheduling of her colonoscopy. She is generally asymptomatic from a GI standpoint. Some minimal soreness to palpation left lower quadrant but she states not particularly bothersome. No red flag/warning signs or symptoms. At this point we'll proceed with her colonoscopy.  Proceed with TCS in the OR with propofol/MAC with Dr. Gala Romney in near future: the risks, benefits, and alternatives have been discussed with the patient in detail. The patient states understanding and desires to proceed.  The patient is currently on trazodone, Neurontin, Xanax. Drinks socially. History of cocaine use, last used 3-4 years ago. Given polypharmacy and history of drug use will plan for the procedure and the OR on propofol/MAC to promote adequate sedation.

## 2015-07-06 NOTE — Patient Instructions (Signed)
1. Call us tomorrow to schedule your colonoscopy, after younger daughter schedule. 2. Further recommendations to be based on results of your procedure. 3. Return for follow-up as needed or based on post procedure recommendations.

## 2015-07-06 NOTE — Progress Notes (Signed)
Referring Provider: Alycia Rossetti, MD Primary Care Physician:  Vic Blackbird, MD Primary GI:  Dr. Gala Romney  Chief Complaint  Patient presents with  . Colonoscopy    HPI:   Annette Galvan is a 55 y.o. female who presents to schedule screening colonoscopy. She was previously scheduled but states that she ate and subsequently had to cancel. She was last seen in our office 07/17/2014 for scheduling of screening colonoscopy. At that time she was asymptomatic from a GI standpoint.  Today she states she's doing well. Denies abdominal pain, N/V, hematochezia, melena, unintentional weight loss, fever, chills, sudden changes in bowel habits. Has 2 bowel movement daily which are consistent with [facility] 7. Denies chest pain, dyspnea, dizziness, lightheadedness, syncope, near syncope. Denies any other upper or lower GI symptoms.  Past Medical History  Diagnosis Date  . Bipolar 1 disorder ([place])   . Back pain, chronic   . Leg pain   . HSV-2 (herpes simplex virus 2) infection 2013  . Bilateral chronic knee pain   . Fibromyalgia   . Hx of trichomoniasis   . Arthritis     needs 2 knee replacements  . Bulging disc   . UTI (lower urinary tract infection) 10/12/2012  . GERD (gastroesophageal reflux disease)   . Vaginal odor 12/28/2012    Had discharge +clue  . Yeast infection 02/04/2013  . Vaginal discharge 06/26/2013  . Trichimoniasis 06/26/2013  . Itching 06/26/2013  . Vaginal irritation 12/05/2013    Past Surgical History  Procedure Laterality Date  . Abdominal hysterectomy    . Breast enhancement surgery    . Tubal ligation      Current Outpatient Prescriptions  Medication Sig Dispense Refill  . ALPRAZolam (XANAX) 1 MG tablet Take 1 tablet (1 mg total) by mouth 4 (four) times daily as needed for anxiety. 120 tablet 2  . cetirizine (ZYRTEC) 10 MG tablet TAKE 1 TABLET BY MOUTH ONCE DAILY. 30 tablet 11  . diclofenac sodium (VOLTAREN) 1 % GEL APPLY 2 GRAMS TO THE AFFECTED AREAS FOUR  TIMES DAILY AS DIRECTED. 300 g 3  . fluticasone (FLONASE) 50 MCG/ACT nasal spray Place 2 sprays into both nostrils 2 (two) times daily as needed for allergies or rhinitis. 16 g 2  . gabapentin (NEURONTIN) 400 MG capsule Take 1 capsule (400 mg total) by mouth 3 (three) times daily. 90 capsule 0  . ibuprofen (ADVIL,MOTRIN) 800 MG tablet TAKE ONE TABLET BY MOUTH TWICE DAILY AS NEEDED FOR PAIN. 60 tablet 3  . lamoTRIgine (LAMICTAL) 100 MG tablet Take 1 tablet (100 mg total) by mouth 2 (two) times daily. 60 tablet 2  . meloxicam (MOBIC) 15 MG tablet TAKE 1 TABLET BY MOUTH ONCE A DAY. 30 tablet 0  . omeprazole (PRILOSEC) 40 MG capsule TAKE (1) CAPSULE BY MOUTH ONCE DAILY FOR ACID REFLUX. 30 capsule 0  . traZODone (DESYREL) 150 MG tablet Take 2 tablets (300 mg total) by mouth at bedtime. 60 tablet 3   No current facility-administered medications for this visit.    Allergies as of 07/06/2015 - Review Complete 07/06/2015  Allergen Reaction Noted  . Latex Swelling 10/17/2012  . Ampicillin Hives and Nausea And Vomiting     Family History  Problem Relation Age of Onset  . Hypertension Mother   . Diabetes Mother   . Depression Mother   . Hyperlipidemia Mother   . Fibromyalgia Mother   . Arthritis Mother   . Cancer Father     bone  .  Hyperlipidemia Father   . Hypertension Father   . Bipolar disorder Father   . Depression Sister   . Hyperlipidemia Sister   . Fibromyalgia Sister   . Fibromyalgia Sister   . Colon cancer Neg Hx   . Bipolar disorder Other   . Drug abuse Other   . Alcohol abuse Other   . Hypertension Brother   . Other Brother     shingles; back problems  . Diabetes Paternal Grandmother   . Alzheimer's disease Maternal Grandmother   . Obesity Daughter   . Other Daughter     back problems  . Bipolar disorder Daughter   . Depression Daughter   . Other Daughter     on pain meds    Social History   Social History  . Marital Status: Widowed    Spouse Name: N/A  .  Number of Children: N/A  . Years of Education: N/A   Social History Main Topics  . Smoking status: Light Tobacco Smoker -- 0.25 packs/day    Types: Cigarettes  . Smokeless tobacco: Never Used  . Alcohol Use: 0.0 oz/week    0 Standard drinks or equivalent per week     Comment: beer occasionally  . Drug Use: No     Comment: cocaine, clean for 4 years as of 07/17/15  . Sexual Activity: Not Currently    Birth Control/ Protection: Surgical   Other Topics Concern  . None   Social History Narrative    Review of Systems: General: Negative for anorexia, weight loss, fever, chills, fatigue, weakness. CV: Negative for chest pain, angina, palpitations, peripheral edema.  Respiratory: Negative for dyspnea at rest, cough, sputum, wheezing.  GI: See history of present illness. Endo: Negative for unusual weight change.    Physical Exam: BP 143/87 mmHg  Pulse 78  Temp(Src) 98.4 F (36.9 C) (Oral)  Ht 5\' 4"  (1.626 m)  Wt 282 lb (127.914 kg)  BMI 48.38 kg/m2 General:   Alert and oriented. Pleasant and cooperative. Well-nourished and well-developed.  Head:  Normocephalic and atraumatic. Eyes:  Without icterus, sclera clear and conjunctiva pink.  Cardiovascular:  S1, S2 present without murmurs appreciated. Extremities without clubbing or edema. Respiratory:  Clear to auscultation bilaterally. No wheezes, rales, or rhonchi. No distress.  Gastrointestinal:  +BS, obese but soft, and non-distended. Minimal to mild LLQ TTP. No HSM noted. No guarding or rebound. No masses appreciated.  Rectal:  Deferred  Musculoskalatal:  Symmetrical without gross deformities. Neurologic:  Alert and oriented x4;  grossly normal neurologically. Psych:  Alert and cooperative. Normal mood and affect. Heme/Lymph/Immune: No excessive bruising noted.    07/06/2015 2:07 PM   Disclaimer: This note was dictated with voice recognition software. Similar sounding words can inadvertently be transcribed and may not be  corrected upon review.

## 2015-07-06 NOTE — Progress Notes (Signed)
CC'ED TO PCP 

## 2015-07-06 NOTE — Telephone Encounter (Signed)
Refill appropriate and filled per protocol. 

## 2015-07-15 ENCOUNTER — Encounter (HOSPITAL_COMMUNITY): Payer: Self-pay | Admitting: Psychiatry

## 2015-07-15 ENCOUNTER — Ambulatory Visit (HOSPITAL_COMMUNITY): Payer: Self-pay | Admitting: Psychiatry

## 2015-07-16 ENCOUNTER — Ambulatory Visit: Payer: Medicare Other | Attending: Family Medicine | Admitting: Sleep Medicine

## 2015-07-16 DIAGNOSIS — G4733 Obstructive sleep apnea (adult) (pediatric): Secondary | ICD-10-CM | POA: Insufficient documentation

## 2015-07-16 DIAGNOSIS — G4730 Sleep apnea, unspecified: Secondary | ICD-10-CM | POA: Diagnosis present

## 2015-07-17 ENCOUNTER — Other Ambulatory Visit: Payer: Self-pay | Admitting: Family Medicine

## 2015-07-17 ENCOUNTER — Telehealth: Payer: Self-pay

## 2015-07-17 NOTE — Telephone Encounter (Signed)
Refill appropriate and filled per protocol. 

## 2015-07-17 NOTE — Telephone Encounter (Signed)
Noted  

## 2015-07-17 NOTE — Telephone Encounter (Signed)
Pt called to schedule her TCS but she can only do it on a Thursday and I told her that she would have to have it done on Monday since she was have Propofol. She can not find anyone to bring her at this time and she said that she may have to wait until next month. Her 30 days is up on 08/05/15. I told her that she would have to come back in if we could not get her scheduled before then

## 2015-07-23 NOTE — Sleep Study (Signed)
Annette A. Merlene Laughter, MD     [email or web address]             NOCTURNAL POLYSOMNOGRAPHY   LOCATION: [facility]  Patient Name: Annette Galvan, Annette Galvan Date: 07/16/2015 Gender: Female D.O.B: May 31, 1960 Age (years): 26 Referring Provider: Not Available Height (inches): 63 Interpreting Physician: [name] MD, ABSM Weight (lbs): 282 RPSGT: Peak, Robert BMI: 50 MRN: [id number] Neck Size: 17.00 CLINICAL INFORMATION Sleep Study Type: NPSG Indication for sleep study: N/A Epworth Sleepiness Score: 3 SLEEP STUDY TECHNIQUE As per the AASM Manual for the Scoring of Sleep and Associated Events v2.3 (April 2016) with a hypopnea requiring 4% desaturations. The channels recorded and monitored were frontal, central and occipital EEG, electrooculogram (EOG), submentalis EMG (chin), nasal and oral airflow, thoracic and abdominal wall motion, anterior tibialis EMG, snore microphone, electrocardiogram, and pulse oximetry. MEDICATIONS Patient's medications include: N/A. Medications self-administered by patient during sleep study : No sleep medicine administered.  Current outpatient prescriptions:  .  ALPRAZolam (XANAX) 1 MG tablet, Take 1 tablet (1 mg total) by mouth 4 (four) times daily as needed for anxiety., Disp: 120 tablet, Rfl: 2 .  cetirizine (ZYRTEC) 10 MG tablet, TAKE 1 TABLET BY MOUTH ONCE DAILY., Disp: 30 tablet, Rfl: 11 .  diclofenac sodium (VOLTAREN) 1 % GEL, APPLY 2 GRAMS TO THE AFFECTED AREAS FOUR TIMES DAILY AS DIRECTED., Disp: 300 g, Rfl: 3 .  fluticasone (FLONASE) 50 MCG/ACT nasal spray, Place 2 sprays into both nostrils 2 (two) times daily as needed for allergies or rhinitis., Disp: 16 g, Rfl: 2 .  gabapentin (NEURONTIN) 400 MG capsule, TAKE 1 CAPSULE BY MOUTH THREE TIMES DAILY., Disp: 90 capsule, Rfl: 3 .  ibuprofen (ADVIL,MOTRIN) 800 MG tablet, TAKE ONE TABLET BY MOUTH TWICE DAILY AS NEEDED FOR PAIN., Disp: 60 tablet, Rfl: 3 .  lamoTRIgine (LAMICTAL) 100 MG  tablet, Take 1 tablet (100 mg total) by mouth 2 (two) times daily., Disp: 60 tablet, Rfl: 2 .  meloxicam (MOBIC) 15 MG tablet, TAKE 1 TABLET BY MOUTH ONCE A DAY., Disp: 30 tablet, Rfl: 0 .  omeprazole (PRILOSEC) 40 MG capsule, TAKE (1) CAPSULE BY MOUTH ONCE DAILY FOR ACID REFLUX., Disp: 30 capsule, Rfl: 0 .  traZODone (DESYREL) 150 MG tablet, Take 2 tablets (300 mg total) by mouth at bedtime., Disp: 60 tablet, Rfl: 3  SLEEP ARCHITECTURE The study was initiated at 10:41:56 PM and ended at 5:09:17 AM. Sleep onset time was 26.8 minutes and the sleep efficiency was 92.6%. The total sleep time was 358.6 minutes. Stage REM latency was 305.5 minutes. The patient spent 0.56% of the night in stage N1 sleep, 76.29% in stage N2 sleep, 16.32% in stage N3 and 6.83% in REM. Alpha intrusion was absent. Supine sleep was 64.91%. RESPIRATORY PARAMETERS The overall apnea/hypopnea index (AHI) was 9.9 per hour. There were 0 total apneas, including 0 obstructive, 0 central and 0 mixed apneas. There were 59 hypopneas and 0 RERAs. The AHI during Stage REM sleep was 53.9 per hour. AHI while supine was 14.7 per hour. The mean oxygen saturation was 92.38%. The minimum SpO2 during sleep was 86.00%. Loud snoring was noted during this study. CARDIAC DATA The 2 lead EKG demonstrated sinus rhythm. The mean heart rate was 66.25 beats per minute. Other EKG findings include: None. LEG MOVEMENT DATA The total PLMS were 0 with a resulting PLMS index of 0.00. Associated arousal with leg movement index was 0.0.    IMPRESSIONS - Mild mostly supine related obstructive sleep apnea that does  not require positive pressure treatment. Consider positional therapy.     Delano Metz, MD Diplomate, American Board of Sleep Medicine.

## 2015-07-29 ENCOUNTER — Other Ambulatory Visit: Payer: Self-pay | Admitting: Family Medicine

## 2015-07-29 NOTE — Telephone Encounter (Signed)
Refill appropriate and filled per protocol. 

## 2015-08-03 ENCOUNTER — Encounter: Payer: Self-pay | Admitting: *Deleted

## 2015-08-03 ENCOUNTER — Telehealth: Payer: Self-pay | Admitting: Family Medicine

## 2015-08-03 ENCOUNTER — Other Ambulatory Visit: Payer: Self-pay | Admitting: Family Medicine

## 2015-08-03 DIAGNOSIS — R7989 Other specified abnormal findings of blood chemistry: Secondary | ICD-10-CM | POA: Diagnosis not present

## 2015-08-03 DIAGNOSIS — E039 Hypothyroidism, unspecified: Secondary | ICD-10-CM | POA: Diagnosis not present

## 2015-08-03 LAB — T4, FREE: Free T4: 1.1 ng/dL (ref 0.8–1.8)

## 2015-08-03 LAB — TSH: TSH: 0.23 m[IU]/L — AB

## 2015-08-03 LAB — T3, FREE: T3 FREE: 3.2 pg/mL (ref 2.3–4.2)

## 2015-08-03 NOTE — Telephone Encounter (Signed)
MD please advise.   Of note, results in chart from 07/23/2015.

## 2015-08-03 NOTE — Telephone Encounter (Signed)
Patient says she has never gotten the results of her sleep study  [phone]

## 2015-08-03 NOTE — Telephone Encounter (Signed)
Received results of study.   IMPRESSIONS - Mild mostly supine related obstructive sleep apnea that does not require positive pressure treatment. Consider positional therapy.   MD made aware and states that pt does not qualify for CPAP/ BiPAP therapy.   Call placed to patient and patient made aware. Annette Galvan

## 2015-08-03 NOTE — Telephone Encounter (Signed)
Call and get final report, it is not in chart

## 2015-08-03 NOTE — Telephone Encounter (Signed)
Request faxed.   Call placed to patient and patient made aware.

## 2015-08-04 ENCOUNTER — Other Ambulatory Visit: Payer: Self-pay | Admitting: Family Medicine

## 2015-08-04 NOTE — Telephone Encounter (Signed)
Refill appropriate and filled per protocol. 

## 2015-08-14 ENCOUNTER — Encounter: Payer: Self-pay | Admitting: Family Medicine

## 2015-08-14 ENCOUNTER — Ambulatory Visit (INDEPENDENT_AMBULATORY_CARE_PROVIDER_SITE_OTHER): Payer: Medicare Other | Admitting: Family Medicine

## 2015-08-14 VITALS — BP 140/88 | HR 82 | Temp 97.8°F | Resp 16 | Ht 63.0 in | Wt 282.0 lb

## 2015-08-14 DIAGNOSIS — M5136 Other intervertebral disc degeneration, lumbar region: Secondary | ICD-10-CM | POA: Diagnosis not present

## 2015-08-14 DIAGNOSIS — M170 Bilateral primary osteoarthritis of knee: Secondary | ICD-10-CM | POA: Diagnosis not present

## 2015-08-14 DIAGNOSIS — E0590 Thyrotoxicosis, unspecified without thyrotoxic crisis or storm: Secondary | ICD-10-CM | POA: Diagnosis not present

## 2015-08-14 MED ORDER — TRAMADOL HCL 50 MG PO TABS: 50.0000 mg | ORAL_TABLET | Freq: Two times a day (BID) | ORAL | Status: DC | PRN

## 2015-08-14 MED ORDER — NAPROXEN 500 MG PO TABS: 500.0000 mg | ORAL_TABLET | Freq: Two times a day (BID) | ORAL | Status: DC

## 2015-08-14 NOTE — Patient Instructions (Signed)
Radioactive uptake /Ultrasound of thyroid  Ultram for pain twice a day  Change to Naprosyn twice a day for inflammatin F/U 4 months

## 2015-08-17 NOTE — Assessment & Plan Note (Signed)
thryoid uptake scan to be done

## 2015-08-17 NOTE — Progress Notes (Signed)
Patient ID: Annette Galvan, female   DOB: 1961/02/10, 55 y.o.   MRN: [id number]    Subjective:    Patient ID: Annette Galvan, female    DOB: 07/16/1960, 55 y.o.   MRN: [id number]  Patient presents for F/U Labs Patient to follow-up. She is history of degenerative disc disease as well as chronic arthritis. She is requesting something for pain. She is seen orthopedic specialist in the past as well as pain clinic but they're not prescribing any narcotic medications. She does have remote history of substance abuse.   Is also here with subclinical hypothyroidism. Last discussed in person. She complains of some fatigue but is been no weight changes. She denies any new night sweats no palpitations. No significant family history of thyroid disorder.   Review Of Systems:  GEN- denies fatigue, fever, weight loss,weakness, recent illness HEENT- denies eye drainage, change in vision, nasal discharge, CVS- denies chest pain, palpitations RESP- denies SOB, cough, wheeze ABD- denies N/V, change in stools, abd pain GU- denies dysuria, hematuria, dribbling, incontinence MSK- +oint pain, muscle aches, injury Neuro- denies headache, dizziness, syncope, seizure activity       Objective:    BP 140/88 mmHg  Pulse 82  Temp(Src) 97.8 F (36.6 C) (Oral)  Resp 16  Ht 5\' 3"  (1.6 m)  Wt 282 lb (127.914 kg)  BMI 49.97 kg/m2 GEN- NAD, alert and oriented x3 HEENT- PERRL, EOMI, non injected sclera, pink conjunctiva, MMM, oropharynx clear Neck- Supple, no thyromegaly, no nodules palpated  CVS- RRR, no murmur RESP-CTAB         Assessment & Plan:      Problem List Items Addressed This Visit    Subclinical hyperthyroidism    thryoid uptake scan to be done      OA (osteoarthritis) of knee   Relevant Medications   naproxen (NAPROSYN) 500 MG tablet   traMADol (ULTRAM) 50 MG tablet   DDD (degenerative disc disease), lumbar - Primary    For now I will place her on tramadol 50 mg 1 tablet twice a day. She  will also continue Naprosyn 500 mg twice a day as needed for pain. He has underlying fibromyalgia as well as obesity makes it difficult to control her pain. She is trying to stay active.      Relevant Medications   naproxen (NAPROSYN) 500 MG tablet   traMADol (ULTRAM) 50 MG tablet      Note: This dictation was prepared with Dragon dictation along with smaller phrase technology. Any transcriptional errors that result from this process are unintentional.

## 2015-08-17 NOTE — Assessment & Plan Note (Signed)
For now I will place her on tramadol 50 mg 1 tablet twice a day. She will also continue Naprosyn 500 mg twice a day as needed for pain. He has underlying fibromyalgia as well as obesity makes it difficult to control her pain. She is trying to stay active.

## 2015-08-21 ENCOUNTER — Ambulatory Visit (INDEPENDENT_AMBULATORY_CARE_PROVIDER_SITE_OTHER): Payer: Medicare Other | Admitting: Psychiatry

## 2015-08-21 ENCOUNTER — Encounter (HOSPITAL_COMMUNITY): Payer: Self-pay | Admitting: Psychiatry

## 2015-08-21 VITALS — BP 147/70 | Ht 63.0 in | Wt 280.0 lb

## 2015-08-21 DIAGNOSIS — F4310 Post-traumatic stress disorder, unspecified: Secondary | ICD-10-CM | POA: Diagnosis not present

## 2015-08-21 MED ORDER — TRAZODONE HCL 150 MG PO TABS: 300.0000 mg | ORAL_TABLET | Freq: Every day | ORAL | Status: DC

## 2015-08-21 MED ORDER — LAMOTRIGINE 100 MG PO TABS: 100.0000 mg | ORAL_TABLET | Freq: Two times a day (BID) | ORAL | Status: DC

## 2015-08-21 MED ORDER — ALPRAZOLAM 1 MG PO TABS: 1.0000 mg | ORAL_TABLET | Freq: Four times a day (QID) | ORAL | Status: DC | PRN
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
## 2015-08-28 ENCOUNTER — Ambulatory Visit: Payer: Self-pay | Admitting: Family Medicine

## 2015-08-31 ENCOUNTER — Ambulatory Visit (HOSPITAL_COMMUNITY): Payer: Self-pay

## 2015-08-31 ENCOUNTER — Ambulatory Visit (HOSPITAL_COMMUNITY): Payer: Medicare Other

## 2015-09-01 ENCOUNTER — Other Ambulatory Visit (HOSPITAL_COMMUNITY): Payer: Self-pay

## 2015-09-03 ENCOUNTER — Ambulatory Visit (HOSPITAL_COMMUNITY)
Admission: RE | Admit: 2015-09-03 | Discharge: 2015-09-03 | Disposition: A | Discharge status: Home / Self Care | Payer: Medicare Other | Source: Ambulatory Visit | Attending: Family Medicine | Admitting: Family Medicine

## 2015-09-03 ENCOUNTER — Encounter (HOSPITAL_COMMUNITY)
Admission: RE | Admit: 2015-09-03 | Discharge: 2015-09-03 | Disposition: A | Discharge status: Home / Self Care | Payer: Medicare Other | Source: Ambulatory Visit | Attending: Family Medicine | Admitting: Family Medicine

## 2015-09-03 ENCOUNTER — Encounter (HOSPITAL_COMMUNITY): Payer: Self-pay

## 2015-09-03 DIAGNOSIS — E0590 Thyrotoxicosis, unspecified without thyrotoxic crisis or storm: Secondary | ICD-10-CM | POA: Insufficient documentation

## 2015-09-03 MED ORDER — SODIUM IODIDE I 131 CAPSULE
9.0000 | Freq: Once | INTRAVENOUS | Status: AC | PRN
  Administered 2015-09-03: 9 via ORAL

## 2015-09-04 ENCOUNTER — Encounter (HOSPITAL_COMMUNITY)
Admission: RE | Admit: 2015-09-04 | Discharge: 2015-09-04 | Disposition: A | Discharge status: Home / Self Care | Payer: Medicare Other | Source: Ambulatory Visit | Attending: Family Medicine | Admitting: Family Medicine

## 2015-09-04 DIAGNOSIS — E0590 Thyrotoxicosis, unspecified without thyrotoxic crisis or storm: Secondary | ICD-10-CM | POA: Diagnosis not present

## 2015-09-04 DIAGNOSIS — R632 Polyphagia: Secondary | ICD-10-CM | POA: Diagnosis not present

## 2015-09-04 MED ORDER — SODIUM PERTECHNETATE TC 99M INJECTION
10.0000 | Freq: Once | INTRAVENOUS | Status: AC | PRN
  Administered 2015-09-04: 10 via INTRAVENOUS

## 2015-09-08 ENCOUNTER — Other Ambulatory Visit: Payer: Self-pay | Admitting: Family Medicine

## 2015-09-11 ENCOUNTER — Other Ambulatory Visit: Payer: Self-pay | Admitting: Family Medicine

## 2015-09-11 NOTE — Telephone Encounter (Signed)
Refill appropriate and filled per protocol. 

## 2015-09-15 ENCOUNTER — Other Ambulatory Visit: Payer: Self-pay | Admitting: Family Medicine

## 2015-09-15 NOTE — Telephone Encounter (Signed)
Refill appropriate and filled per protocol. 

## 2015-10-01 ENCOUNTER — Other Ambulatory Visit: Payer: Self-pay | Admitting: Family Medicine

## 2015-10-01 DIAGNOSIS — R079 Chest pain, unspecified: Secondary | ICD-10-CM | POA: Diagnosis not present

## 2015-10-06 ENCOUNTER — Encounter: Payer: Self-pay | Admitting: Family Medicine

## 2015-10-06 ENCOUNTER — Ambulatory Visit (INDEPENDENT_AMBULATORY_CARE_PROVIDER_SITE_OTHER): Payer: Medicare Other | Admitting: Family Medicine

## 2015-10-06 VITALS — BP 148/84 | HR 82 | Temp 98.8°F | Resp 16 | Ht 63.0 in | Wt 282.0 lb

## 2015-10-06 DIAGNOSIS — R079 Chest pain, unspecified: Secondary | ICD-10-CM | POA: Diagnosis not present

## 2015-10-06 DIAGNOSIS — R110 Nausea: Secondary | ICD-10-CM

## 2015-10-06 DIAGNOSIS — M170 Bilateral primary osteoarthritis of knee: Secondary | ICD-10-CM

## 2015-10-06 DIAGNOSIS — R300 Dysuria: Secondary | ICD-10-CM | POA: Diagnosis not present

## 2015-10-06 LAB — COMPREHENSIVE METABOLIC PANEL
ALBUMIN: 3.9 g/dL (ref 3.6–5.1)
ALK PHOS: 71 U/L (ref 33–130)
ALT: 10 U/L (ref 6–29)
AST: 15 U/L (ref 10–35)
BUN: 17 mg/dL (ref 7–25)
CHLORIDE: 104 mmol/L (ref 98–110)
CO2: 27 mmol/L (ref 20–31)
CREATININE: 0.78 mg/dL (ref 0.50–1.05)
Calcium: 8.8 mg/dL (ref 8.6–10.4)
Glucose, Bld: 91 mg/dL (ref 70–99)
POTASSIUM: 4.5 mmol/L (ref 3.5–5.3)
Sodium: 139 mmol/L (ref 135–146)
TOTAL PROTEIN: 6.7 g/dL (ref 6.1–8.1)
Total Bilirubin: 0.3 mg/dL (ref 0.2–1.2)

## 2015-10-06 LAB — CBC WITH DIFFERENTIAL/PLATELET
BASOS ABS: 0 {cells}/uL (ref 0–200)
BASOS PCT: 0 %
EOS ABS: 54 {cells}/uL (ref 15–500)
Eosinophils Relative: 1 %
HCT: 41.5 % (ref 35.0–45.0)
HEMOGLOBIN: 13.6 g/dL (ref 12.0–15.0)
LYMPHS ABS: 1512 {cells}/uL (ref 850–3900)
Lymphocytes Relative: 28 %
MCH: 30.1 pg (ref 27.0–33.0)
MCHC: 32.8 g/dL (ref 32.0–36.0)
MCV: 91.8 fL (ref 80.0–100.0)
MONO ABS: 324 {cells}/uL (ref 200–950)
MPV: 9.5 fL (ref 7.5–12.5)
Monocytes Relative: 6 %
NEUTROS ABS: 3510 {cells}/uL (ref 1500–7800)
Neutrophils Relative %: 65 %
Platelets: 238 10*3/uL (ref 140–400)
RBC: 4.52 MIL/uL (ref 3.80–5.10)
RDW: 13.5 % (ref 11.0–15.0)
WBC: 5.4 10*3/uL (ref 3.8–10.8)

## 2015-10-06 LAB — URINALYSIS, ROUTINE W REFLEX MICROSCOPIC
Bilirubin Urine: NEGATIVE
GLUCOSE, UA: NEGATIVE
HGB URINE DIPSTICK: NEGATIVE
LEUKOCYTES UA: NEGATIVE
NITRITE: NEGATIVE
PH: 6 (ref 5.0–8.0)
Protein, ur: NEGATIVE
SPECIFIC GRAVITY, URINE: 1.02 (ref 1.001–1.035)

## 2015-10-06 MED ORDER — PROMETHAZINE HCL 25 MG/ML IJ SOLN
25.0000 mg | Freq: Once | INTRAMUSCULAR | Status: AC
  Administered 2015-10-06: 25 mg via INTRAMUSCULAR

## 2015-10-06 MED ORDER — TRAMADOL HCL 50 MG PO TABS: 50.0000 mg | ORAL_TABLET | Freq: Two times a day (BID) | ORAL | Status: DC | PRN

## 2015-10-06 MED ORDER — FLUTICASONE PROPIONATE 50 MCG/ACT NA SUSP: 2.0000 | Freq: Two times a day (BID) | NASAL | Status: DC | PRN

## 2015-10-06 MED ORDER — PROMETHAZINE HCL 12.5 MG PO TABS: 12.5000 mg | ORAL_TABLET | Freq: Three times a day (TID) | ORAL | Status: DC | PRN

## 2015-10-06 NOTE — Telephone Encounter (Signed)
Just seen today.  Do not see on med list.  OK?

## 2015-10-06 NOTE — Progress Notes (Signed)
Patient ID: Annette Galvan, female   DOB: 07-Nov-1960, 55 y.o.   MRN: [id number]   Subjective:    Patient ID: Annette Galvan, female    DOB: 02/14/1961, 55 y.o.   MRN: [id number]  Patient presents for Medication Management and Dysuria  Patient here with nausea, back pain. Frequency states she's had some dark area for the past week. She also has some chest discomfort. She states that she drinks some type of energy drink that contain alcohol subsequently had chest pain on Friday EMS was called to her home with his her blood pressure was very elevated they ran a monitor on her and it came down but she still does not feel right. She's also in a lot of pain with her joints and back. She needs a new referral back to her orthopedist. She's been staying in the house mostly because she hurt so much and this makes her depressed and all she does is eat states that she ate 6 cheeseburgers in the past 2 days. She is not followed up with her psychologist. No current chest pain or SOB    Review Of Systems:  GEN- denies fatigue, fever, weight loss,weakness, recent illness HEENT- denies eye drainage, change in vision, nasal discharge, CVS- +chest pain, palpitations RESP- denies SOB, cough, wheeze ABD-+ N/ no V, change in stools, abd pain GU- denies dysuria, hematuria, dribbling, incontinence MSK- + joint pain, muscle aches, injury Neuro- denies headache, dizziness, syncope, seizure activity       Objective:    BP 148/84 mmHg  Pulse 82  Temp(Src) 98.8 F (37.1 C) (Oral)  Resp 16  Ht 5\' 3"  (1.6 m)  Wt 282 lb (127.914 kg)  BMI 49.97 kg/m2 GEN- NAD, alert and oriented x3 HEENT- PERRL, EOMI, non injected sclera, pink conjunctiva, MMM, oropharynx clear CVS- RRR, no murmur , repeat BP 144/80 RESP-CTAB ABD-NABS,soft,NT,ND, TTP Bilat flank msk- ttp LUMBAR REGION, TTP palpation of legs bilat  EXT- No edema Pulses- Radial, DP- 2+   EKG- NSR     Assessment & Plan:      Problem List Items Addressed This  Visit    OA (osteoarthritis) of knee    Referral back to her orthopedist, continue ultram Discussed need for movement/activity to help with weight       Relevant Medications   traMADol (ULTRAM) 50 MG tablet   Morbid obesity ([place])    Other Visit Diagnoses    Dysuria    -  Primary    Relevant Orders    Urinalysis, Routine w reflex microscopic (not at [facility]) (Completed)    Urine culture    Chest pain, unspecified chest pain type        atypical CP, after ETOH and caffeiene drink, EKG today normal. Given phenergan for nausea, improved in office. No sign of overt UTI, send for culture, increase fluids    Relevant Orders    CBC with Differential/Platelet    Comprehensive metabolic panel    EKG [id number] (Completed)    Nausea without vomiting        Relevant Medications    promethazine (PHENERGAN) injection 25 mg (Completed)       Note: This dictation was prepared with Dragon dictation along with smaller phrase technology. Any transcriptional errors that result from this process are unintentional.

## 2015-10-06 NOTE — Telephone Encounter (Signed)
okay

## 2015-10-06 NOTE — Patient Instructions (Addendum)
Drink water Avoid fast food We will call with lab results Nausea medications Referral to Dr. Aline Brochure F/U as previous

## 2015-10-06 NOTE — Assessment & Plan Note (Signed)
Referral back to her orthopedist, continue ultram Discussed need for movement/activity to help with weight

## 2015-10-06 NOTE — Addendum Note (Signed)
Addended by: Vic Blackbird F on: 10/06/2015 11:30 AM   Modules accepted: Orders

## 2015-10-07 NOTE — Telephone Encounter (Signed)
rx sent to pharmacy

## 2015-10-08 ENCOUNTER — Encounter: Payer: Self-pay | Admitting: *Deleted

## 2015-10-08 LAB — URINE CULTURE: Colony Count: 6000

## 2015-10-31 ENCOUNTER — Other Ambulatory Visit: Payer: Self-pay | Admitting: Family Medicine

## 2015-11-02 NOTE — Telephone Encounter (Signed)
Refill appropriate and filled per protocol. 

## 2015-11-10 ENCOUNTER — Ambulatory Visit: Payer: Self-pay | Admitting: Family Medicine

## 2015-11-11 ENCOUNTER — Telehealth (HOSPITAL_COMMUNITY): Payer: Self-pay | Admitting: *Deleted

## 2015-11-11 NOTE — Telephone Encounter (Signed)
phone call from patient, she said Aug. 3, is her daughter's birthday and she wants to know if she can not come for her appointment but come and pick up her medications.

## 2015-11-11 NOTE — Telephone Encounter (Signed)
Called pt to offer sooner appt due to previous message. Offered appt for 11-12-15 and pt stated she will call office back so she can see if she can make it.

## 2015-11-11 NOTE — Telephone Encounter (Signed)
Pt called back and stated she she would like to take the sooner appt and schedule an appt.

## 2015-11-12 ENCOUNTER — Encounter (HOSPITAL_COMMUNITY): Payer: Self-pay | Admitting: Psychiatry

## 2015-11-12 ENCOUNTER — Ambulatory Visit (INDEPENDENT_AMBULATORY_CARE_PROVIDER_SITE_OTHER): Payer: Medicare Other | Admitting: Psychiatry

## 2015-11-12 VITALS — BP 140/75 | HR 84 | Ht 63.0 in | Wt 277.6 lb

## 2015-11-12 DIAGNOSIS — F4310 Post-traumatic stress disorder, unspecified: Secondary | ICD-10-CM | POA: Diagnosis not present

## 2015-11-12 MED ORDER — LAMOTRIGINE 100 MG PO TABS: 100.0000 mg | ORAL_TABLET | Freq: Two times a day (BID) | ORAL | 2 refills | Status: DC

## 2015-11-12 MED ORDER — ALPRAZOLAM 1 MG PO TABS: 1.0000 mg | ORAL_TABLET | Freq: Four times a day (QID) | ORAL | 2 refills | Status: DC | PRN

## 2015-11-12 MED ORDER — TRAZODONE HCL 150 MG PO TABS: 300.0000 mg | ORAL_TABLET | Freq: Every day | ORAL | 3 refills | Status: DC
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
## 2015-11-13 ENCOUNTER — Encounter: Payer: Self-pay | Admitting: Gastroenterology

## 2015-11-13 ENCOUNTER — Ambulatory Visit (INDEPENDENT_AMBULATORY_CARE_PROVIDER_SITE_OTHER): Payer: Medicare Other | Admitting: Gastroenterology

## 2015-11-13 ENCOUNTER — Other Ambulatory Visit: Payer: Self-pay

## 2015-11-13 ENCOUNTER — Ambulatory Visit: Payer: Self-pay | Admitting: Gastroenterology

## 2015-11-13 VITALS — BP 123/72 | HR 75 | Temp 96.9°F | Ht 63.0 in | Wt 275.0 lb

## 2015-11-13 DIAGNOSIS — Z1211 Encounter for screening for malignant neoplasm of colon: Secondary | ICD-10-CM

## 2015-11-13 DIAGNOSIS — Z79899 Other long term (current) drug therapy: Secondary | ICD-10-CM | POA: Diagnosis not present

## 2015-11-13 NOTE — Progress Notes (Signed)
cc'ed to pcp °

## 2015-11-13 NOTE — Patient Instructions (Signed)
1. Colonoscopy as scheduled. See separate instructions.  

## 2015-11-13 NOTE — Assessment & Plan Note (Signed)
Patient presents again for scheduling first ever screening colonoscopy. Due to polypharmacy, she will have deep sedation in the OR.  I have discussed the risks, alternatives, benefits with regards to but not limited to the risk of reaction to medication, bleeding, infection, perforation and the patient is agreeable to proceed. Written consent to be obtained.  I have discussed bowel prep and clear liquid diet in depth with the patient today. Discussed the need to keep her appointment. We have rescheduled her four separate times. She is very interested in completing this colonoscopy.

## 2015-11-13 NOTE — Progress Notes (Signed)
Primary Care Physician:  Vic Blackbird, MD  Primary Gastroenterologist:  Garfield Cornea, MD   Chief Complaint  Patient presents with  . Colonoscopy    HPI:  Annette Galvan is a 55 y.o. female here to schedule her first ever colonoscopy. She initially presented in September 2014 to schedule her screening colonoscopy but subsequently has been rescheduled at least on 3 separate occasions. On one occasion her procedure was canceled because she ate solid food during her bowel prep. She requires deep sedation due to polypharmacy and prior polysubstance abuse several years ago. She denies constipation, diarrhea, melena, rectal bleeding. No abdominal pain. Several year history of GERD, symptoms well controlled on omeprazole. No dysphagia.     Current Outpatient Prescriptions  Medication Sig Dispense Refill  . ALPRAZolam (XANAX) 1 MG tablet Take 1 tablet (1 mg total) by mouth 4 (four) times daily as needed for anxiety. 120 tablet 2  . cetirizine (ZYRTEC) 10 MG tablet TAKE 1 TABLET BY MOUTH ONCE DAILY. 30 tablet 11  . diclofenac sodium (VOLTAREN) 1 % GEL APPLY 2 GRAMS TO THE AFFECTED AREAS FOUR TIMES DAILY AS DIRECTED. 300 g 0  . fluticasone (FLONASE) 50 MCG/ACT nasal spray Place 2 sprays into both nostrils 2 (two) times daily as needed for allergies or rhinitis. 16 g 2  . gabapentin (NEURONTIN) 400 MG capsule TAKE 1 CAPSULE BY MOUTH THREE TIMES DAILY. 90 capsule 3  . ibuprofen (ADVIL,MOTRIN) 800 MG tablet TAKE ONE TABLET BY MOUTH TWICE DAILY AS NEEDED FOR PAIN. 60 tablet 3  . lamoTRIgine (LAMICTAL) 100 MG tablet Take 1 tablet (100 mg total) by mouth 2 (two) times daily. 60 tablet 2  . meloxicam (MOBIC) 15 MG tablet TAKE 1 TABLET BY MOUTH ONCE A DAY. 90 tablet 1  . naproxen (NAPROSYN) 500 MG tablet TAKE (1) TABLET BY MOUTH TWICE DAILY WITH MEALS. 30 tablet 0  . omeprazole (PRILOSEC) 40 MG capsule TAKE (1) CAPSULE BY MOUTH ONCE DAILY FOR ACID REFLUX. 30 capsule 3  . promethazine (PHENERGAN) 12.5 MG  tablet Take 1 tablet (12.5 mg total) by mouth every 8 (eight) hours as needed for nausea or vomiting. 20 tablet 0  . traZODone (DESYREL) 150 MG tablet Take 2 tablets (300 mg total) by mouth at bedtime. 60 tablet 3  . traMADol (ULTRAM) 50 MG tablet Take 1 tablet (50 mg total) by mouth every 12 (twelve) hours as needed. 60 tablet 1   No current facility-administered medications for this visit.     Allergies as of 11/13/2015 - Review Complete 11/13/2015  Allergen Reaction Noted  . Latex Swelling 10/17/2012  . Ampicillin Hives and Nausea And Vomiting     Past Medical History:  Diagnosis Date  . Arthritis    needs 2 knee replacements  . Back pain, chronic   . Bilateral chronic knee pain   . Bipolar 1 disorder ([place])   . Bulging disc   . Fibromyalgia   . GERD (gastroesophageal reflux disease)   . HSV-2 (herpes simplex virus 2) infection 2013  . Hx of trichomoniasis   . Itching 06/26/2013  . Leg pain   . Trichimoniasis 06/26/2013  . UTI (lower urinary tract infection) 10/12/2012  . Vaginal discharge 06/26/2013  . Vaginal irritation 12/05/2013  . Vaginal odor 12/28/2012   Had discharge +clue  . Yeast infection 02/04/2013    Past Surgical History:  Procedure Laterality Date  . ABDOMINAL HYSTERECTOMY    . BREAST ENHANCEMENT SURGERY    . TUBAL LIGATION  Family History  Problem Relation Age of Onset  . Hypertension Mother   . Diabetes Mother   . Depression Mother   . Hyperlipidemia Mother   . Fibromyalgia Mother   . Arthritis Mother   . Cancer Father     bone  . Hyperlipidemia Father   . Hypertension Father   . Bipolar disorder Father   . Depression Sister   . Hyperlipidemia Sister   . Fibromyalgia Sister   . Fibromyalgia Sister   . Colon cancer Neg Hx   . Bipolar disorder Other   . Drug abuse Other   . Alcohol abuse Other   . Hypertension Brother   . Other Brother     shingles; back problems  . Diabetes Paternal Grandmother   . Alzheimer's disease Maternal  Grandmother   . Obesity Daughter   . Other Daughter     back problems  . Bipolar disorder Daughter   . Depression Daughter   . Other Daughter     on pain meds    Social History   Social History  . Marital status: Widowed    Spouse name: N/A  . Number of children: N/A  . Years of education: N/A   Occupational History  . Not on file.   Social History Main Topics  . Smoking status: Light Tobacco Smoker    Packs/day: 0.25    Types: Cigarettes  . Smokeless tobacco: Never Used     Comment: 2 cigarettes daily  . Alcohol use 0.0 oz/week     Comment: beer occasionally  . Drug use: No     Comment: cocaine, clean for 4 years as of 07/17/15  . Sexual activity: Not Currently    Birth control/ protection: Surgical   Other Topics Concern  . Not on file   Social History Narrative  . No narrative on file      ROS:  General: Negative for anorexia, weight loss, fever, chills, fatigue, weakness. Eyes: Negative for vision changes.  ENT: Negative for hoarseness, difficulty swallowing , nasal congestion. CV: Negative for chest pain, angina, palpitations, dyspnea on exertion, peripheral edema.  Respiratory: Negative for dyspnea at rest, dyspnea on exertion, cough, sputum, wheezing.  GI: See history of present illness. GU:  Negative for dysuria, hematuria, urinary incontinence, urinary frequency, nocturnal urination.  MS: Negative for joint pain, low back pain.  Derm: Negative for rash or itching.  Neuro: Negative for weakness, abnormal sensation, seizure, frequent headaches, memory loss, confusion.  Psych: Negative for anxiety, depression, suicidal ideation, hallucinations.  Endo: Negative for unusual weight change.  Heme: Negative for bruising or bleeding. Allergy: Negative for rash or hives.    Physical Examination:  BP 123/72   Pulse 75   Temp (!) 96.9 F (36.1 C) (Oral)   Ht 5\' 3"  (1.6 m)   Wt 275 lb (124.7 kg)   BMI 48.71 kg/m    General: Well-nourished,  well-developed in no acute distress.  Head: Normocephalic, atraumatic.   Eyes: Conjunctiva pink, no icterus. Mouth: Oropharyngeal mucosa moist and pink , no lesions erythema or exudate. Neck: Supple without thyromegaly, masses, or lymphadenopathy.  Lungs: Clear to auscultation bilaterally.  Heart: Regular rate and rhythm, no murmurs rubs or gallops.  Abdomen: Bowel sounds are normal, nontender, nondistended, no hepatosplenomegaly or masses, no abdominal bruits or    hernia , no rebound or guarding.   Rectal: deferred Extremities: No lower extremity edema. No clubbing or deformities.  Neuro: Alert and oriented x 4 , grossly normal neurologically.  Skin: Warm and dry, no rash or jaundice.   Psych: Alert and cooperative, normal mood and affect.  Labs: Lab Results  Component Value Date   CREATININE 0.78 10/06/2015   BUN 17 10/06/2015   NA 139 10/06/2015   K 4.5 10/06/2015   CL 104 10/06/2015   CO2 27 10/06/2015   Lab Results  Component Value Date   ALT 10 10/06/2015   AST 15 10/06/2015   ALKPHOS 71 10/06/2015   BILITOT 0.3 10/06/2015   Lab Results  Component Value Date   WBC 5.4 10/06/2015   HGB 13.6 10/06/2015   HCT 41.5 10/06/2015   MCV 91.8 10/06/2015   PLT 238 10/06/2015     Imaging Studies: No results found.

## 2015-11-16 ENCOUNTER — Other Ambulatory Visit: Payer: Self-pay | Admitting: Family Medicine

## 2015-11-16 NOTE — Telephone Encounter (Signed)
Refill appropriate and filled per protocol. 

## 2015-11-17 ENCOUNTER — Encounter: Payer: Self-pay | Admitting: Family Medicine

## 2015-11-17 ENCOUNTER — Ambulatory Visit (INDEPENDENT_AMBULATORY_CARE_PROVIDER_SITE_OTHER): Payer: Medicare Other | Admitting: Family Medicine

## 2015-11-17 VITALS — BP 122/70 | HR 80 | Temp 98.6°F | Resp 16 | Ht 63.0 in | Wt 280.0 lb

## 2015-11-17 DIAGNOSIS — N898 Other specified noninflammatory disorders of vagina: Secondary | ICD-10-CM | POA: Diagnosis not present

## 2015-11-17 DIAGNOSIS — R300 Dysuria: Secondary | ICD-10-CM | POA: Diagnosis not present

## 2015-11-17 DIAGNOSIS — Z113 Encounter for screening for infections with a predominantly sexual mode of transmission: Secondary | ICD-10-CM | POA: Diagnosis not present

## 2015-11-17 DIAGNOSIS — A6000 Herpesviral infection of urogenital system, unspecified: Secondary | ICD-10-CM

## 2015-11-17 LAB — URINALYSIS, MICROSCOPIC ONLY
CASTS: NONE SEEN [LPF]
CRYSTALS: NONE SEEN [HPF]
YEAST: NONE SEEN [HPF]

## 2015-11-17 LAB — URINALYSIS, ROUTINE W REFLEX MICROSCOPIC
BILIRUBIN URINE: NEGATIVE
Glucose, UA: NEGATIVE
KETONES UR: NEGATIVE
NITRITE: NEGATIVE
PROTEIN: NEGATIVE
SPECIFIC GRAVITY, URINE: 1.02 (ref 1.001–1.035)
pH: 7 (ref 5.0–8.0)

## 2015-11-17 LAB — WET PREP FOR TRICH, YEAST, CLUE
Trich, Wet Prep: NONE SEEN
Yeast Wet Prep HPF POC: NONE SEEN

## 2015-11-17 MED ORDER — LORCASERIN HCL 10 MG PO TABS: 1.0000 | ORAL_TABLET | Freq: Two times a day (BID) | ORAL | 3 refills | Status: DC

## 2015-11-17 MED ORDER — VALACYCLOVIR HCL 1 G PO TABS: 1000.0000 mg | ORAL_TABLET | Freq: Two times a day (BID) | ORAL | 0 refills | Status: DC

## 2015-11-17 NOTE — Addendum Note (Signed)
Addended by: Sheral Flow on: 11/17/2015 04:27 PM   Modules accepted: Orders

## 2015-11-17 NOTE — Progress Notes (Signed)
   Subjective:    Patient ID: Annette Galvan, female    DOB: May 09, 1960, 55 y.o.   MRN: [id number]  Patient presents for Dysuria (frequent urination, cloudy urine, malodorous urine, flank pain) and STD Check (has new partner and would like full check- states that she is in HSV outbreak)  Patient here for STD check. She's had vaginal discharge as well as an HSV type II outbreak over the past week. She was diagnosed with herpes back in 2013 she states that she's had mild outbreaks in between but has never asked for any medication. She has a new partner who is low but younger than her. She states that she use a condom but has had a lot of discharge recently. She also has burning which is worse after she finishes urinating. She denies any abdominal pain and nausea vomiting no fever  She would also like to try the Belviq again for her weight loss. We were able to get this approved. Patient assistance last year.   Review Of Systems:  GEN- denies fatigue, fever, weight loss,weakness, recent illness HEENT- denies eye drainage, change in vision, nasal discharge, CVS- denies chest pain, palpitations RESP- denies SOB, cough, wheeze ABD- denies N/V, change in stools, abd pain GU- + dysuria, hematuria, dribbling, incontinence Neuro- denies headache, dizziness, syncope, seizure activity       Objective:    BP 122/70 (BP Location: Left Arm, Patient Position: Sitting, Cuff Size: Large)   Pulse 80   Temp 98.6 F (37 C) (Oral)   Resp 16   Ht 5\' 3"  (1.6 m)   Wt 280 lb (127 kg)   BMI 49.60 kg/m  GEN- NAD, alert and oriented x3 CVS- RRR, no murmur RESP-CTAB GU- normal external genitalia, vaginal mucosa pink and moist, s/p hystrectomy, ulcerations noted in gluteal cleft and base of labia majora, +erythema, white discharge in vault         Assessment & Plan:      Problem List Items Addressed This Visit    Morbid obesity ([place])    wILL try Belviq again, needs patient assitance, work on diet  continue to reiterate healthy food choices, low carb      Relevant Medications   Lorcaserin HCl (BELVIQ) 10 MG TABS    Other Visit Diagnoses    Dysuria    -  Primary   UA/Urine culture, most likle due to more vaginitis and the HSV outbreak. cultures pending    Relevant Orders   Urinalysis, Routine w reflex microscopic (not at [facility]) (Completed)   Urine culture   Vaginal discharge       Relevant Orders   WET PREP FOR [place], YEAST, CLUE (Completed)   GC/Chlamydia Probe Amp   Screen for STD (sexually transmitted disease)       Relevant Orders   HIV antibody   RPR   Genital herpes       Treat with valtrex BID      Note: This dictation was prepared with Dragon dictation along with smaller phrase technology. Any transcriptional errors that result from this process are unintentional.

## 2015-11-17 NOTE — Assessment & Plan Note (Addendum)
wILL try Belviq again, needs patient assitance, work on diet continue to reiterate healthy food choices, low carb

## 2015-11-17 NOTE — Patient Instructions (Addendum)
We will call with results Start valtrex as prescribed  F/U as previous

## 2015-11-18 LAB — GC/CHLAMYDIA PROBE AMP
CT PROBE, AMP APTIMA: NOT DETECTED
GC Probe RNA: NOT DETECTED

## 2015-11-18 LAB — RPR: RPR: REACTIVE — AB

## 2015-11-18 LAB — HIV ANTIBODY (ROUTINE TESTING W REFLEX): HIV 1&2 Ab, 4th Generation: NONREACTIVE

## 2015-11-18 LAB — FLUORESCENT TREPONEMAL AB(FTA)-IGG-BLD: Fluorescent Treponemal ABS: REACTIVE — AB

## 2015-11-18 LAB — RPR TITER: RPR Titer: 1:1 {titer}

## 2015-11-19 ENCOUNTER — Ambulatory Visit (HOSPITAL_COMMUNITY): Payer: Self-pay | Admitting: Psychiatry

## 2015-11-20 ENCOUNTER — Other Ambulatory Visit: Payer: Self-pay | Admitting: *Deleted

## 2015-11-20 LAB — URINE CULTURE

## 2015-11-20 MED ORDER — CIPROFLOXACIN HCL 500 MG PO TABS: 500.0000 mg | ORAL_TABLET | Freq: Two times a day (BID) | ORAL | 0 refills | Status: DC

## 2015-11-20 NOTE — Addendum Note (Signed)
Addended by: Vic Blackbird F on: 11/20/2015 05:12 PM   Modules accepted: Orders

## 2015-11-24 ENCOUNTER — Telehealth: Payer: Self-pay | Admitting: *Deleted

## 2015-11-24 NOTE — Telephone Encounter (Signed)
Received call from patient.   Reports that she has misplaced bottle of ABTx (cipro). Patient has not completed treatment.   Call placed to pharmacy. Out of pocket expense is $14.09 since prescription is too soon to refill.   MD please advise.

## 2015-11-24 NOTE — Telephone Encounter (Signed)
How many days did she take? IF she took at least 5 days, then she is fine,           if not can she not afford the cipro??    If she cant afford it then, send in Keflex 500mg  QID for 5 days

## 2015-11-25 ENCOUNTER — Other Ambulatory Visit: Payer: Self-pay | Admitting: Family Medicine

## 2015-11-25 MED ORDER — CEPHALEXIN 500 MG PO CAPS: 500.0000 mg | ORAL_CAPSULE | Freq: Four times a day (QID) | ORAL | 0 refills | Status: DC

## 2015-11-25 NOTE — Telephone Encounter (Signed)
Refill appropriate and filled per protocol. 

## 2015-11-25 NOTE — Telephone Encounter (Signed)
Patient took (3) days of ABTx.   Patient cannot afford to pay for new prescription.   Keflex sent to pharmacy.

## 2015-11-30 ENCOUNTER — Other Ambulatory Visit: Payer: Self-pay | Admitting: Family Medicine

## 2015-11-30 NOTE — Telephone Encounter (Signed)
Ok to refill??  Last office visit 11/17/2015.  Last refill 10/06/2015, #1 refills.

## 2015-11-30 NOTE — Telephone Encounter (Signed)
Okay to refill? 

## 2015-12-01 NOTE — Telephone Encounter (Signed)
Medication called to pharmacy. 

## 2015-12-02 NOTE — Patient Instructions (Signed)
Annette Galvan  12/02/2015     @PREFPERIOPPHARMACY @   Your procedure is scheduled on  12/07/2015   Report to [facility] at  800  A.M.  Call this number if you have problems the morning of surgery:  [phone]   Remember:  Do not eat food or drink liquids after midnight.  Take these medicines the morning of surgery with A SIP OF WATER  Xanax, zyrtec,neurontin, lamictil, mobic, prilosec, phenergan, ultram.   Do not wear jewelry, make-up or nail polish.  Do not wear lotions, powders, or perfumes.  You may wear deoderant.  Do not shave 48 hours prior to surgery.  Men may shave face and neck.  Do not bring valuables to the hospital.  [facility] is not responsible for any belongings or valuables.  Contacts, dentures or bridgework may not be worn into surgery.  Leave your suitcase in the car.  After surgery it may be brought to your room.  For patients admitted to the hospital, discharge time will be determined by your treatment team.  Patients discharged the day of surgery will not be allowed to drive home.   Name and phone number of your driver:   family Special instructions:  Follow the diet and prep instructions given to you by Dr Roseanne Kaufman office.  Please read over the following fact sheets that you were given. Anesthesia Post-op Instructions and Care and Recovery After Surgery       Colonoscopy A colonoscopy is an exam to look at the entire large intestine (colon). This exam can help find problems such as tumors, polyps, inflammation, and areas of bleeding. The exam takes about 1 hour.  LET [facility] CARE PROVIDER KNOW ABOUT:   Any allergies you have.  All medicines you are taking, including vitamins, herbs, eye drops, creams, and over-the-counter medicines.  Previous problems you or members of your family have had with the use of anesthetics.  Any blood disorders you have.  Previous surgeries you have had.  Medical conditions you have. RISKS AND  COMPLICATIONS  Generally, this is a safe procedure. However, as with any procedure, complications can occur. Possible complications include:  Bleeding.  Tearing or rupture of the colon wall.  Reaction to medicines given during the exam.  Infection (rare). BEFORE THE PROCEDURE   Ask your health care provider about changing or stopping your regular medicines.  You may be prescribed an oral bowel prep. This involves drinking a large amount of medicated liquid, starting the day before your procedure. The liquid will cause you to have multiple loose stools until your stool is almost clear or light green. This cleans out your colon in preparation for the procedure.  Do not eat or drink anything else once you have started the bowel prep, unless your health care provider tells you it is safe to do so.  Arrange for someone to drive you home after the procedure. PROCEDURE   You will be given medicine to help you relax (sedative).  You will lie on your side with your knees bent.  A long, flexible tube with a light and camera on the end (colonoscope) will be inserted through the rectum and into the colon. The camera sends video back to a computer screen as it moves through the colon. The colonoscope also releases carbon dioxide gas to inflate the colon. This helps your health care provider see the area better.  During the exam, your health care provider  may take a small tissue sample (biopsy) to be examined under a microscope if any abnormalities are found.  The exam is finished when the entire colon has been viewed. AFTER THE PROCEDURE   Do not drive for 24 hours after the exam.  You may have a small amount of blood in your stool.  You may pass moderate amounts of gas and have mild abdominal cramping or bloating. This is caused by the gas used to inflate your colon during the exam.  Ask when your test results will be ready and how you will get your results. Make sure you get your test  results.   This information is not intended to replace advice given to you by your health care provider. Make sure you discuss any questions you have with your health care provider.   Document Released: 04/01/2000 Document Revised: 01/23/2013 Document Reviewed: 12/10/2012 Elsevier Interactive Patient Education 2016 [employer] Colonoscopy, Care After Refer to this sheet in the next few weeks. These instructions provide you with information on caring for yourself after your procedure. Your health care provider may also give you more specific instructions. Your treatment has been planned according to current medical practices, but problems sometimes occur. Call your health care provider if you have any problems or questions after your procedure. WHAT TO EXPECT AFTER THE PROCEDURE  After your procedure, it is typical to have the following:  A small amount of blood in your stool.  Moderate amounts of gas and mild abdominal cramping or bloating. HOME CARE INSTRUCTIONS  Do not drive, operate machinery, or sign important documents for 24 hours.  You may shower and resume your regular physical activities, but move at a slower pace for the first 24 hours.  Take frequent rest periods for the first 24 hours.  Walk around or put a warm pack on your abdomen to help reduce abdominal cramping and bloating.  Drink enough fluids to keep your urine clear or pale yellow.  You may resume your normal diet as instructed by your health care provider. Avoid heavy or fried foods that are hard to digest.  Avoid drinking alcohol for 24 hours or as instructed by your health care provider.  Only take over-the-counter or prescription medicines as directed by your health care provider.  If a tissue sample (biopsy) was taken during your procedure:  Do not take aspirin or blood thinners for 7 days, or as instructed by your health care provider.  Do not drink alcohol for 7 days, or as instructed by your health  care provider.  Eat soft foods for the first 24 hours. SEEK MEDICAL CARE IF: You have persistent spotting of blood in your stool 2-3 days after the procedure. SEEK IMMEDIATE MEDICAL CARE IF:  You have more than a small spotting of blood in your stool.  You pass large blood clots in your stool.  Your abdomen is swollen (distended).  You have nausea or vomiting.  You have a fever.  You have increasing abdominal pain that is not relieved with medicine.   This information is not intended to replace advice given to you by your health care provider. Make sure you discuss any questions you have with your health care provider.   Document Released: 11/17/2003 Document Revised: 01/23/2013 Document Reviewed: 12/10/2012 Elsevier Interactive Patient Education 2016 [place] Monitored anesthesia care is an anesthesia service for a medical procedure. Anesthesia is the loss of the ability to feel pain. It is produced by medicines called anesthetics.  It may affect a small area of your body (local anesthesia), a large area of your body (regional anesthesia), or your entire body (general anesthesia). The need for monitored anesthesia care depends your procedure, your condition, and the potential need for regional or general anesthesia. It is often provided during procedures where:   General anesthesia may be needed if there are complications. This is because you need special care when you are under general anesthesia.   You will be under local or regional anesthesia. This is so that you are able to have higher levels of anesthesia if needed.   You will receive calming medicines (sedatives). This is especially the case if sedatives are given to put you in a semi-conscious state of relaxation (deep sedation). This is because the amount of sedative needed to produce this state can be hard to predict. Too much of a sedative can produce general anesthesia. Monitored anesthesia  care is performed by one or more health care providers who have special training in all types of anesthesia. You will need to meet with these health care providers before your procedure. During this meeting, they will ask you about your medical history. They will also give you instructions to follow. (For example, you will need to stop eating and drinking before your procedure. You may also need to stop or change medicines you are taking.) During your procedure, your health care providers will stay with you. They will:   Watch your condition. This includes watching your blood pressure, breathing, and level of pain.   Diagnose and treat problems that occur.   Give medicines if they are needed. These may include calming medicines (sedatives) and anesthetics.   Make sure you are comfortable.  Having monitored anesthesia care does not necessarily mean that you will be under anesthesia. It does mean that your health care providers will be able to manage anesthesia if you need it or if it occurs. It also means that you will be able to have a different type of anesthesia than you are having if you need it. When your procedure is complete, your health care providers will continue to watch your condition. They will make sure any medicines wear off before you are allowed to go home.    This information is not intended to replace advice given to you by your health care provider. Make sure you discuss any questions you have with your health care provider.   Document Released: 12/29/2004 Document Revised: 04/25/2014 Document Reviewed: 05/16/2012 Elsevier Interactive Patient Education 2016 Elsevier Inc. PATIENT INSTRUCTIONS POST-ANESTHESIA  IMMEDIATELY FOLLOWING SURGERY:  Do not drive or operate machinery for the first twenty four hours after surgery.  Do not make any important decisions for twenty four hours after surgery or while taking narcotic pain medications or sedatives.  If you develop intractable  nausea and vomiting or a severe headache please notify your doctor immediately.  FOLLOW-UP:  Please make an appointment with your surgeon as instructed. You do not need to follow up with anesthesia unless specifically instructed to do so.  WOUND CARE INSTRUCTIONS (if applicable):  Keep a dry clean dressing on the anesthesia/puncture wound site if there is drainage.  Once the wound has quit draining you may leave it open to air.  Generally you should leave the bandage intact for twenty four hours unless there is drainage.  If the epidural site drains for more than 36-48 hours please call the anesthesia department.  QUESTIONS?:  Please feel free to call your physician or  the hospital operator if you have any questions, and they will be happy to assist you.

## 2015-12-03 ENCOUNTER — Encounter (HOSPITAL_COMMUNITY): Payer: Self-pay

## 2015-12-03 ENCOUNTER — Encounter (HOSPITAL_COMMUNITY)
Admission: RE | Admit: 2015-12-03 | Discharge: 2015-12-03 | Disposition: A | Discharge status: Home / Self Care | Payer: Medicare Other | Source: Ambulatory Visit | Attending: Internal Medicine | Admitting: Internal Medicine

## 2015-12-03 DIAGNOSIS — Z01812 Encounter for preprocedural laboratory examination: Secondary | ICD-10-CM | POA: Diagnosis not present

## 2015-12-03 LAB — CBC WITH DIFFERENTIAL/PLATELET
BASOS PCT: 0 %
Basophils Absolute: 0 10*3/uL (ref 0.0–0.1)
EOS ABS: 0.1 10*3/uL (ref 0.0–0.7)
EOS PCT: 1 %
HCT: 39.6 % (ref 36.0–46.0)
Hemoglobin: 12.7 g/dL (ref 12.0–15.0)
LYMPHS ABS: 1.9 10*3/uL (ref 0.7–4.0)
Lymphocytes Relative: 33 %
MCH: 30.2 pg (ref 26.0–34.0)
MCHC: 32.1 g/dL (ref 30.0–36.0)
MCV: 94.3 fL (ref 78.0–100.0)
MONO ABS: 0.4 10*3/uL (ref 0.1–1.0)
MONOS PCT: 7 %
Neutro Abs: 3.3 10*3/uL (ref 1.7–7.7)
Neutrophils Relative %: 59 %
Platelets: 241 10*3/uL (ref 150–400)
RBC: 4.2 MIL/uL (ref 3.87–5.11)
RDW: 14.2 % (ref 11.5–15.5)
WBC: 5.7 10*3/uL (ref 4.0–10.5)

## 2015-12-03 LAB — BASIC METABOLIC PANEL
Anion gap: 5 (ref 5–15)
BUN: 26 mg/dL — AB (ref 6–20)
CALCIUM: 8.6 mg/dL — AB (ref 8.9–10.3)
CHLORIDE: 105 mmol/L (ref 101–111)
CO2: 26 mmol/L (ref 22–32)
CREATININE: 0.72 mg/dL (ref 0.44–1.00)
GFR calc non Af Amer: >60 mL/min (ref >60)
Glucose, Bld: 96 mg/dL (ref 65–99)
Potassium: 4.3 mmol/L (ref 3.5–5.1)
SODIUM: 136 mmol/L (ref 135–145)

## 2015-12-03 NOTE — Pre-Procedure Instructions (Signed)
Patient given information to sign up for my chart at home. 

## 2015-12-07 ENCOUNTER — Ambulatory Visit (HOSPITAL_COMMUNITY): Payer: Medicare Other | Admitting: Anesthesiology

## 2015-12-07 ENCOUNTER — Encounter (HOSPITAL_COMMUNITY): Payer: Self-pay | Admitting: *Deleted

## 2015-12-07 ENCOUNTER — Ambulatory Visit (HOSPITAL_COMMUNITY)
Admission: RE | Admit: 2015-12-07 | Discharge: 2015-12-07 | Disposition: A | Discharge status: Home / Self Care | Payer: Medicare Other | Source: Ambulatory Visit | Attending: Internal Medicine | Admitting: Internal Medicine

## 2015-12-07 ENCOUNTER — Encounter (HOSPITAL_COMMUNITY)
Admission: RE | Disposition: A | Discharge status: Home / Self Care | Payer: Self-pay | Source: Ambulatory Visit | Attending: Internal Medicine

## 2015-12-07 ENCOUNTER — Telehealth: Payer: Self-pay | Admitting: Orthopedic Surgery

## 2015-12-07 DIAGNOSIS — G4730 Sleep apnea, unspecified: Secondary | ICD-10-CM | POA: Diagnosis not present

## 2015-12-07 DIAGNOSIS — Z6841 Body Mass Index (BMI) 40.0 and over, adult: Secondary | ICD-10-CM | POA: Insufficient documentation

## 2015-12-07 DIAGNOSIS — Z791 Long term (current) use of non-steroidal anti-inflammatories (NSAID): Secondary | ICD-10-CM | POA: Insufficient documentation

## 2015-12-07 DIAGNOSIS — F17210 Nicotine dependence, cigarettes, uncomplicated: Secondary | ICD-10-CM | POA: Insufficient documentation

## 2015-12-07 DIAGNOSIS — K219 Gastro-esophageal reflux disease without esophagitis: Secondary | ICD-10-CM | POA: Insufficient documentation

## 2015-12-07 DIAGNOSIS — Z79899 Other long term (current) drug therapy: Secondary | ICD-10-CM | POA: Diagnosis not present

## 2015-12-07 DIAGNOSIS — K5730 Diverticulosis of large intestine without perforation or abscess without bleeding: Secondary | ICD-10-CM

## 2015-12-07 DIAGNOSIS — M1990 Unspecified osteoarthritis, unspecified site: Secondary | ICD-10-CM | POA: Insufficient documentation

## 2015-12-07 DIAGNOSIS — Z1211 Encounter for screening for malignant neoplasm of colon: Secondary | ICD-10-CM | POA: Diagnosis not present

## 2015-12-07 HISTORY — PX: COLONOSCOPY WITH PROPOFOL: SHX5780

## 2015-12-07 SURGERY — COLONOSCOPY WITH PROPOFOL
Anesthesia: Monitor Anesthesia Care

## 2015-12-07 MED ORDER — CHLORHEXIDINE GLUCONATE CLOTH 2 % EX PADS: 6.0000 | MEDICATED_PAD | Freq: Once | CUTANEOUS | Status: DC

## 2015-12-07 MED ORDER — MIDAZOLAM HCL 2 MG/2ML IJ SOLN
2.0000 mg | Freq: Once | INTRAMUSCULAR | Status: AC
  Administered 2015-12-07: 2 mg via INTRAVENOUS

## 2015-12-07 MED ORDER — ONDANSETRON HCL 4 MG/2ML IJ SOLN: 4.0000 mg | Freq: Once | INTRAMUSCULAR | Status: DC | PRN

## 2015-12-07 MED ORDER — LACTATED RINGERS IV SOLN
INTRAVENOUS | Status: DC
  Administered 2015-12-07: 10:00:00 via INTRAVENOUS

## 2015-12-07 MED ORDER — PROPOFOL 10 MG/ML IV BOLUS
INTRAVENOUS | Status: AC
  Filled 2015-12-07: qty 40

## 2015-12-07 MED ORDER — PROPOFOL 500 MG/50ML IV EMUL
INTRAVENOUS | Status: DC | PRN
  Administered 2015-12-07: 11:00:00 via INTRAVENOUS
  Administered 2015-12-07: 150 ug/kg/min via INTRAVENOUS

## 2015-12-07 MED ORDER — MIDAZOLAM HCL 2 MG/2ML IJ SOLN
INTRAMUSCULAR | Status: AC
  Filled 2015-12-07: qty 2

## 2015-12-07 MED ORDER — PROPOFOL 10 MG/ML IV BOLUS
INTRAVENOUS | Status: DC | PRN
  Administered 2015-12-07 (×2): 12 mg via INTRAVENOUS

## 2015-12-07 MED ORDER — MIDAZOLAM HCL 5 MG/5ML IJ SOLN
INTRAMUSCULAR | Status: DC | PRN
  Administered 2015-12-07: 2 mg via INTRAVENOUS

## 2015-12-07 MED ORDER — LACTATED RINGERS IV SOLN
INTRAVENOUS | Status: DC | PRN
  Administered 2015-12-07: 09:00:00 via INTRAVENOUS

## 2015-12-07 NOTE — Anesthesia Postprocedure Evaluation (Signed)
Anesthesia Post Note  Patient: Annette Galvan  Procedure(s) Performed: Procedure(s) (LRB): COLONOSCOPY WITH PROPOFOL (N/A)  Patient location during evaluation: PACU Anesthesia Type: MAC Level of consciousness: awake and alert Pain management: pain level controlled Vital Signs Assessment: post-procedure vital signs reviewed and stable Respiratory status: spontaneous breathing Cardiovascular status: blood pressure returned to baseline Postop Assessment: no signs of nausea or vomiting Anesthetic complications: no    Last Vitals:  Vitals:   12/07/15 1045 12/07/15 1130  BP: (!) 159/88   Pulse:    Resp: 16 (!) 21  Temp:  (P) 36.5 C    Last Pain:  Vitals:   12/07/15 0904  TempSrc: (P) Oral                 [name]

## 2015-12-07 NOTE — Telephone Encounter (Signed)
Patient has been referred by primary care, Dr Buelah Manis, has referred to Dr Aline Brochure for knee pain and possible total knee replacement surgery. Annette Galvan seen here about 15 years ago. Patient relays she's been seen by Dr Teola Bradley, [facility] as recently as 1 year ago.  Discussed 2nd opinion protocol, although patient feels may not be able to go back to this office if not approved to come back to Dr Aline Brochure.  Please review notes in [place] and please advise.  [phone]

## 2015-12-07 NOTE — Anesthesia Preprocedure Evaluation (Addendum)
Anesthesia Evaluation  Patient identified by MRN, date of birth, ID band Patient awake    Reviewed: Allergy & Precautions, NPO status , Patient's Chart, lab work & pertinent test results  Airway Mallampati: III  TM Distance: >3 FB Neck ROM: Full    Dental no notable dental hx. (+) Teeth Intact   Pulmonary sleep apnea , Current Smoker,  Patient states she does not have OSA   Pulmonary exam normal breath sounds clear to auscultation       Cardiovascular negative cardio ROS Normal cardiovascular exam Rhythm:Regular Rate:Normal     Neuro/Psych PSYCHIATRIC DISORDERS Bipolar Disorder  Neuromuscular disease negative neurological ROS     GI/Hepatic GERD  Medicated and Controlled,Screening colonoscopy   Endo/Other  Hyperthyroidism Morbid obesity  Renal/GU   negative genitourinary   Musculoskeletal  (+) Arthritis , Osteoarthritis,  Fibromyalgia -Chronic LBP DDD lumbar spine   Abdominal (+) + obese,   Peds  Hematology negative hematology ROS (+)   Anesthesia Other Findings   Reproductive/Obstetrics                            Anesthesia Physical Anesthesia Plan  ASA: III  Anesthesia Plan: MAC   Post-op Pain Management:    Induction:   Airway Management Planned: Natural Airway and Nasal Cannula  Additional Equipment:   Intra-op Plan:   Post-operative Plan:   Informed Consent: I have reviewed the patients History and Physical, chart, labs and discussed the procedure including the risks, benefits and alternatives for the proposed anesthesia with the patient or authorized representative who has indicated his/her understanding and acceptance.   Dental advisory given  Plan Discussed with: Anesthesiologist, CRNA and Surgeon  Anesthesia Plan Comments:         Anesthesia Quick Evaluation

## 2015-12-07 NOTE — Transfer of Care (Signed)
Immediate Anesthesia Transfer of Care Note  Patient: Annette Galvan  Procedure(s) Performed: Procedure(s) with comments: COLONOSCOPY WITH PROPOFOL (N/A) - 915  Patient Location: PACU  Anesthesia Type:MAC  Level of Consciousness: awake and alert   Airway & Oxygen Therapy: Patient Spontanous Breathing and Patient connected to nasal cannula oxygen  Post-op Assessment: Report given to RN  Post vital signs: Reviewed and stable  Last Vitals:  Vitals:   12/07/15 1040 12/07/15 1045  BP: (!) 149/63 (!) 159/88  Pulse:    Resp: (!) 29 16  Temp:      Last Pain:  Vitals:   12/07/15 0904  TempSrc: (P) Oral      Patients Stated Pain Goal: (P) 5 ([id number])  Complications: No apparent anesthesia complications

## 2015-12-07 NOTE — H&P (View-Only) (Signed)
Primary Care Physician:  Vic Blackbird, MD  Primary Gastroenterologist:  Garfield Cornea, MD   Chief Complaint  Patient presents with  . Colonoscopy    HPI:  Annette Galvan is a 55 y.o. female here to schedule her first ever colonoscopy. She initially presented in September 2014 to schedule her screening colonoscopy but subsequently has been rescheduled at least on 3 separate occasions. On one occasion her procedure was canceled because she ate solid food during her bowel prep. She requires deep sedation due to polypharmacy and prior polysubstance abuse several years ago. She denies constipation, diarrhea, melena, rectal bleeding. No abdominal pain. Several year history of GERD, symptoms well controlled on omeprazole. No dysphagia.     Current Outpatient Prescriptions  Medication Sig Dispense Refill  . ALPRAZolam (XANAX) 1 MG tablet Take 1 tablet (1 mg total) by mouth 4 (four) times daily as needed for anxiety. 120 tablet 2  . cetirizine (ZYRTEC) 10 MG tablet TAKE 1 TABLET BY MOUTH ONCE DAILY. 30 tablet 11  . diclofenac sodium (VOLTAREN) 1 % GEL APPLY 2 GRAMS TO THE AFFECTED AREAS FOUR TIMES DAILY AS DIRECTED. 300 g 0  . fluticasone (FLONASE) 50 MCG/ACT nasal spray Place 2 sprays into both nostrils 2 (two) times daily as needed for allergies or rhinitis. 16 g 2  . gabapentin (NEURONTIN) 400 MG capsule TAKE 1 CAPSULE BY MOUTH THREE TIMES DAILY. 90 capsule 3  . ibuprofen (ADVIL,MOTRIN) 800 MG tablet TAKE ONE TABLET BY MOUTH TWICE DAILY AS NEEDED FOR PAIN. 60 tablet 3  . lamoTRIgine (LAMICTAL) 100 MG tablet Take 1 tablet (100 mg total) by mouth 2 (two) times daily. 60 tablet 2  . meloxicam (MOBIC) 15 MG tablet TAKE 1 TABLET BY MOUTH ONCE A DAY. 90 tablet 1  . naproxen (NAPROSYN) 500 MG tablet TAKE (1) TABLET BY MOUTH TWICE DAILY WITH MEALS. 30 tablet 0  . omeprazole (PRILOSEC) 40 MG capsule TAKE (1) CAPSULE BY MOUTH ONCE DAILY FOR ACID REFLUX. 30 capsule 3  . promethazine (PHENERGAN) 12.5 MG  tablet Take 1 tablet (12.5 mg total) by mouth every 8 (eight) hours as needed for nausea or vomiting. 20 tablet 0  . traZODone (DESYREL) 150 MG tablet Take 2 tablets (300 mg total) by mouth at bedtime. 60 tablet 3  . traMADol (ULTRAM) 50 MG tablet Take 1 tablet (50 mg total) by mouth every 12 (twelve) hours as needed. 60 tablet 1   No current facility-administered medications for this visit.     Allergies as of 11/13/2015 - Review Complete 11/13/2015  Allergen Reaction Noted  . Latex Swelling 10/17/2012  . Ampicillin Hives and Nausea And Vomiting     Past Medical History:  Diagnosis Date  . Arthritis    needs 2 knee replacements  . Back pain, chronic   . Bilateral chronic knee pain   . Bipolar 1 disorder ([place])   . Bulging disc   . Fibromyalgia   . GERD (gastroesophageal reflux disease)   . HSV-2 (herpes simplex virus 2) infection 2013  . Hx of trichomoniasis   . Itching 06/26/2013  . Leg pain   . Trichimoniasis 06/26/2013  . UTI (lower urinary tract infection) 10/12/2012  . Vaginal discharge 06/26/2013  . Vaginal irritation 12/05/2013  . Vaginal odor 12/28/2012   Had discharge +clue  . Yeast infection 02/04/2013    Past Surgical History:  Procedure Laterality Date  . ABDOMINAL HYSTERECTOMY    . BREAST ENHANCEMENT SURGERY    . TUBAL LIGATION  Family History  Problem Relation Age of Onset  . Hypertension Mother   . Diabetes Mother   . Depression Mother   . Hyperlipidemia Mother   . Fibromyalgia Mother   . Arthritis Mother   . Cancer Father     bone  . Hyperlipidemia Father   . Hypertension Father   . Bipolar disorder Father   . Depression Sister   . Hyperlipidemia Sister   . Fibromyalgia Sister   . Fibromyalgia Sister   . Colon cancer Neg Hx   . Bipolar disorder Other   . Drug abuse Other   . Alcohol abuse Other   . Hypertension Brother   . Other Brother     shingles; back problems  . Diabetes Paternal Grandmother   . Alzheimer's disease Maternal  Grandmother   . Obesity Daughter   . Other Daughter     back problems  . Bipolar disorder Daughter   . Depression Daughter   . Other Daughter     on pain meds    Social History   Social History  . Marital status: Widowed    Spouse name: N/A  . Number of children: N/A  . Years of education: N/A   Occupational History  . Not on file.   Social History Main Topics  . Smoking status: Light Tobacco Smoker    Packs/day: 0.25    Types: Cigarettes  . Smokeless tobacco: Never Used     Comment: 2 cigarettes daily  . Alcohol use 0.0 oz/week     Comment: beer occasionally  . Drug use: No     Comment: cocaine, clean for 4 years as of 07/17/15  . Sexual activity: Not Currently    Birth control/ protection: Surgical   Other Topics Concern  . Not on file   Social History Narrative  . No narrative on file      ROS:  General: Negative for anorexia, weight loss, fever, chills, fatigue, weakness. Eyes: Negative for vision changes.  ENT: Negative for hoarseness, difficulty swallowing , nasal congestion. CV: Negative for chest pain, angina, palpitations, dyspnea on exertion, peripheral edema.  Respiratory: Negative for dyspnea at rest, dyspnea on exertion, cough, sputum, wheezing.  GI: See history of present illness. GU:  Negative for dysuria, hematuria, urinary incontinence, urinary frequency, nocturnal urination.  MS: Negative for joint pain, low back pain.  Derm: Negative for rash or itching.  Neuro: Negative for weakness, abnormal sensation, seizure, frequent headaches, memory loss, confusion.  Psych: Negative for anxiety, depression, suicidal ideation, hallucinations.  Endo: Negative for unusual weight change.  Heme: Negative for bruising or bleeding. Allergy: Negative for rash or hives.    Physical Examination:  BP 123/72   Pulse 75   Temp (!) 96.9 F (36.1 C) (Oral)   Ht 5\' 3"  (1.6 m)   Wt 275 lb (124.7 kg)   BMI 48.71 kg/m    General: Well-nourished,  well-developed in no acute distress.  Head: Normocephalic, atraumatic.   Eyes: Conjunctiva pink, no icterus. Mouth: Oropharyngeal mucosa moist and pink , no lesions erythema or exudate. Neck: Supple without thyromegaly, masses, or lymphadenopathy.  Lungs: Clear to auscultation bilaterally.  Heart: Regular rate and rhythm, no murmurs rubs or gallops.  Abdomen: Bowel sounds are normal, nontender, nondistended, no hepatosplenomegaly or masses, no abdominal bruits or    hernia , no rebound or guarding.   Rectal: deferred Extremities: No lower extremity edema. No clubbing or deformities.  Neuro: Alert and oriented x 4 , grossly normal neurologically.  Skin: Warm and dry, no rash or jaundice.   Psych: Alert and cooperative, normal mood and affect.  Labs: Lab Results  Component Value Date   CREATININE 0.78 10/06/2015   BUN 17 10/06/2015   NA 139 10/06/2015   K 4.5 10/06/2015   CL 104 10/06/2015   CO2 27 10/06/2015   Lab Results  Component Value Date   ALT 10 10/06/2015   AST 15 10/06/2015   ALKPHOS 71 10/06/2015   BILITOT 0.3 10/06/2015   Lab Results  Component Value Date   WBC 5.4 10/06/2015   HGB 13.6 10/06/2015   HCT 41.5 10/06/2015   MCV 91.8 10/06/2015   PLT 238 10/06/2015     Imaging Studies: No results found.

## 2015-12-07 NOTE — Anesthesia Postprocedure Evaluation (Signed)
Anesthesia Post Note  Patient: Annette Galvan  Procedure(s) Performed: Procedure(s) (LRB): COLONOSCOPY WITH PROPOFOL (N/A)  Patient location during evaluation: PACU Anesthesia Type: MAC Level of consciousness: awake and alert and oriented Pain management: pain level controlled Vital Signs Assessment: post-procedure vital signs reviewed and stable Respiratory status: spontaneous breathing, nonlabored ventilation and respiratory function stable Cardiovascular status: stable and blood pressure returned to baseline Postop Assessment: no signs of nausea or vomiting Anesthetic complications: no    Last Vitals:  Vitals:   12/07/15 1130 12/07/15 1140  BP: 134/82   Pulse:  62  Resp: (!) 21 18  Temp: 36.5 C     Last Pain:  Vitals:   12/07/15 0904  TempSrc: (P) Oral                 [name] A.

## 2015-12-07 NOTE — Discharge Instructions (Addendum)
Colonoscopy Discharge Instructions  Read the instructions outlined below and refer to this sheet in the next few weeks. These discharge instructions provide you with general information on caring for yourself after you leave the hospital. Your doctor may also give you specific instructions. While your treatment has been planned according to the most current medical practices available, unavoidable complications occasionally occur. If you have any problems or questions after discharge, call Dr. Gala Romney at [phone]. ACTIVITY  You may resume your regular activity, but move at a slower pace for the next 24 hours.   Take frequent rest periods for the next 24 hours.   Walking will help get rid of the air and reduce the bloated feeling in your belly (abdomen).   No driving for 24 hours (because of the medicine (anesthesia) used during the test).    Do not sign any important legal documents or operate any machinery for 24 hours (because of the anesthesia used during the test).  NUTRITION  Drink plenty of fluids.   You may resume your normal diet as instructed by your doctor.   Begin with a light meal and progress to your normal diet. Heavy or fried foods are harder to digest and may make you feel sick to your stomach (nauseated).   Avoid alcoholic beverages for 24 hours or as instructed.  MEDICATIONS  You may resume your normal medications unless your doctor tells you otherwise.  WHAT YOU CAN EXPECT TODAY  Some feelings of bloating in the abdomen.   Passage of more gas than usual.   Spotting of blood in your stool or on the toilet paper.  IF YOU HAD POLYPS REMOVED DURING THE COLONOSCOPY:  No aspirin products for 7 days or as instructed.   No alcohol for 7 days or as instructed.   Eat a soft diet for the next 24 hours.  FINDING OUT THE RESULTS OF YOUR TEST Not all test results are available during your visit. If your test results are not back during the visit, make an appointment  with your caregiver to find out the results. Do not assume everything is normal if you have not heard from your caregiver or the medical facility. It is important for you to follow up on all of your test results.  SEEK IMMEDIATE MEDICAL ATTENTION IF:  You have more than a spotting of blood in your stool.   Your belly is swollen (abdominal distention).   You are nauseated or vomiting.   You have a temperature over 101.   You have abdominal pain or discomfort that is severe or gets worse throughout the day.      Diverticulosis information provided  Return for repeat screening colonoscopy in 10 years   Diverticulosis Diverticulosis is the condition that develops when small pouches (diverticula) form in the wall of your colon. Your colon, or large intestine, is where water is absorbed and stool is formed. The pouches form when the inside layer of your colon pushes through weak spots in the outer layers of your colon. CAUSES  No one knows exactly what causes diverticulosis. RISK FACTORS  Being older than 14. Your risk for this condition increases with age. Diverticulosis is rare in people younger than 40 years. By age 29, almost everyone has it.  Eating a low-fiber diet.  Being frequently constipated.  Being overweight.  Not getting enough exercise.  Smoking.  Taking over-the-counter pain medicines, like aspirin and ibuprofen. SYMPTOMS  Most people with diverticulosis do not have symptoms. DIAGNOSIS  Because  diverticulosis often has no symptoms, health care providers often discover the condition during an exam for other colon problems. In many cases, a health care provider will diagnose diverticulosis while using a flexible scope to examine the colon (colonoscopy). TREATMENT  If you have never developed an infection related to diverticulosis, you may not need treatment. If you have had an infection before, treatment may include:  Eating more fruits, vegetables, and  grains.  Taking a fiber supplement.  Taking a live bacteria supplement (probiotic).  Taking medicine to relax your colon. HOME CARE INSTRUCTIONS   Drink at least 6-8 glasses of water each day to prevent constipation.  Try not to strain when you have a bowel movement.  Keep all follow-up appointments. If you have had an infection before:  Increase the fiber in your diet as directed by your health care provider or dietitian.  Take a dietary fiber supplement if your health care provider approves.  Only take medicines as directed by your health care provider. SEEK MEDICAL CARE IF:   You have abdominal pain.  You have bloating.  You have cramps.  You have not gone to the bathroom in 3 days. SEEK IMMEDIATE MEDICAL CARE IF:   Your pain gets worse.  Yourbloating becomes very bad.  You have a fever or chills, and your symptoms suddenly get worse.  You begin vomiting.  You have bowel movements that are bloody or black. MAKE SURE YOU:  Understand these instructions.  Will watch your condition.  Will get help right away if you are not doing well or get worse.   This information is not intended to replace advice given to you by your health care provider. Make sure you discuss any questions you have with your health care provider.   Document Released: 12/31/2003 Document Revised: 04/09/2013 Document Reviewed: 02/27/2013 Elsevier Interactive Patient Education [employer].

## 2015-12-07 NOTE — Op Note (Signed)
[facility] Patient Name: Annette Galvan Procedure Date: 12/07/2015 10:42 AM MRN: [id number] Date of Birth: 02/16/1961 Attending MD: Norvel Richards , MD CSN: [id number] Age: 55 Admit Type: Outpatient Procedure:                Colonoscopy - screening Indications:              Screening for colorectal malignant neoplasm Providers:                Norvel Richards, MD, Jeanann Lewandowsky. Sharon Seller, RN,                            Randa Spike, Technician Referring MD:              Medicines:                Propofol per Anesthesia Complications:            No immediate complications. Estimated Blood Loss:     Estimated blood loss: none. Procedure:                Pre-Anesthesia Assessment:                           - Prior to the procedure, a History and Physical                            was performed, and patient medications and                            allergies were reviewed. The patient's tolerance of                            previous anesthesia was also reviewed. The risks                            and benefits of the procedure and the sedation                            options and risks were discussed with the patient.                            All questions were answered, and informed consent                            was obtained. Prior Anticoagulants: The patient has                            taken no previous anticoagulant or antiplatelet                            agents. ASA Grade Assessment: II - A patient with                            mild systemic disease. After reviewing the risks  and benefits, the patient was deemed in                            satisfactory condition to undergo the procedure.                           After obtaining informed consent, the colonoscope                            was passed under direct vision. Throughout the                            procedure, the patient's blood pressure, pulse, and                oxygen saturations were monitored continuously. The                            Colonoscope was introduced through the anus and                            advanced to the the cecum, identified by                            appendiceal orifice and ileocecal valve. The                            colonoscopy was performed without difficulty. The                            patient tolerated the procedure well. The quality                            of the bowel preparation was adequate. The                            ileocecal valve, appendiceal orifice, and rectum                            were photographed. The colonoscopy was performed                            without difficulty. Scope In: 11:10:33 AM Scope Out: 11:22:49 AM Scope Withdrawal Time: 0 hours 7 minutes 25 seconds  Total Procedure Duration: 0 hours 12 minutes 16 seconds  Findings:      The perianal and digital rectal examinations were normal.      Multiple small and large-mouthed diverticula were found in the sigmoid       colon and descending colon.      The exam was otherwise without abnormality on direct and retroflexion       views. Impression:               - Diverticulosis in the sigmoid colon and in the                            descending colon.                           -  The examination was otherwise normal on direct                            and retroflexion views.                           - No specimens collected. Moderate Sedation:      Moderate (conscious) sedation was personally administered by an       anesthesia professional. The following parameters were monitored: oxygen       saturation, heart rate, blood pressure, respiratory rate, EKG, adequacy       of pulmonary ventilation, and response to care. Total physician       intraservice time was 19 minutes. Recommendation:           - Patient has a contact number available for                            emergencies. The signs and symptoms  of potential                            delayed complications were discussed with the                            patient. Return to normal activities tomorrow.                            Written discharge instructions were provided to the                            patient.                           - Advance diet as tolerated.                           - Continue present medications.                           - Repeat colonoscopy in 10 years for screening                            purposes.                           - Return to GI office PRN. Procedure Code(s):        --- Professional ---                           [phone], Colonoscopy, flexible; diagnostic, including                            collection of specimen(s) by brushing or washing,                            when performed (separate procedure) Diagnosis Code(s):        --- Professional ---  Z12.11, Encounter for screening for malignant                            neoplasm of colon                           K57.30, Diverticulosis of large intestine without                            perforation or abscess without bleeding CPT copyright 2016 American Medical Association. All rights reserved. The codes documented in this report are preliminary and upon coder review may  be revised to meet current compliance requirements. Cristopher Estimable. Veleka Djordjevic, MD Norvel Richards, MD 12/07/2015 11:34:55 AM This report has been signed electronically. Number of Addenda: 0

## 2015-12-07 NOTE — Interval H&P Note (Signed)
History and Physical Interval Note:  12/07/2015 10:48 AM  Annette Galvan  has presented today for surgery, with the diagnosis of SCREENING  The various methods of treatment have been discussed with the patient and family. After consideration of risks, benefits and other options for treatment, the patient has consented to  Procedure(s) with comments: COLONOSCOPY WITH PROPOFOL (N/A) - 915 as a surgical intervention .  The patient's history has been reviewed, patient examined, no change in status, stable for surgery.  I have reviewed the patient's chart and labs.  Questions were answered to the patient's satisfaction.     No change. First ever screening colonoscopy today.   The risks, benefits, limitations, alternatives and imponderables have been reviewed with the patient. Questions have been answered. All parties are agreeable.   Manus Rudd

## 2015-12-07 NOTE — Addendum Note (Signed)
Addendum  created 12/07/15 1144 by Josephine Igo, MD   Sign clinical note

## 2015-12-08 NOTE — Telephone Encounter (Signed)
Approved for new patient appt in 2 weeks

## 2015-12-08 NOTE — Telephone Encounter (Signed)
Called patient to relay and offer appointment.  Left voice message to return call.

## 2015-12-10 ENCOUNTER — Encounter: Payer: Self-pay | Admitting: Orthopedic Surgery

## 2015-12-11 ENCOUNTER — Encounter (HOSPITAL_COMMUNITY): Payer: Self-pay | Admitting: Internal Medicine

## 2015-12-25 ENCOUNTER — Other Ambulatory Visit: Payer: Self-pay | Admitting: Family Medicine

## 2015-12-28 ENCOUNTER — Ambulatory Visit: Payer: Self-pay | Admitting: Orthopedic Surgery

## 2015-12-28 ENCOUNTER — Encounter: Payer: Self-pay | Admitting: Orthopedic Surgery

## 2016-01-01 ENCOUNTER — Other Ambulatory Visit: Payer: Self-pay | Admitting: *Deleted

## 2016-01-01 MED ORDER — DICLOFENAC SODIUM 1 % TD GEL: TRANSDERMAL | 3 refills | Status: DC

## 2016-01-01 NOTE — Telephone Encounter (Signed)
Received fax requesting refill on voltaren gel.   Refill appropriate and filled per protocol.

## 2016-01-05 ENCOUNTER — Other Ambulatory Visit: Payer: Self-pay | Admitting: Family Medicine

## 2016-01-08 ENCOUNTER — Ambulatory Visit: Payer: Self-pay | Admitting: Family Medicine

## 2016-01-08 ENCOUNTER — Observation Stay (HOSPITAL_COMMUNITY): Payer: Medicare Other

## 2016-01-08 ENCOUNTER — Encounter (HOSPITAL_COMMUNITY): Payer: Self-pay | Admitting: *Deleted

## 2016-01-08 ENCOUNTER — Observation Stay (HOSPITAL_COMMUNITY)
Admission: EM | Admit: 2016-01-08 | Discharge: 2016-01-09 | Disposition: A | Discharge status: Home / Self Care | Payer: Medicare Other | Attending: Family Medicine | Admitting: Family Medicine

## 2016-01-08 DIAGNOSIS — F319 Bipolar disorder, unspecified: Secondary | ICD-10-CM

## 2016-01-08 DIAGNOSIS — L0390 Cellulitis, unspecified: Secondary | ICD-10-CM | POA: Diagnosis not present

## 2016-01-08 DIAGNOSIS — Z791 Long term (current) use of non-steroidal anti-inflammatories (NSAID): Secondary | ICD-10-CM | POA: Insufficient documentation

## 2016-01-08 DIAGNOSIS — R7303 Prediabetes: Secondary | ICD-10-CM | POA: Diagnosis not present

## 2016-01-08 DIAGNOSIS — L02214 Cutaneous abscess of groin: Secondary | ICD-10-CM | POA: Diagnosis not present

## 2016-01-08 DIAGNOSIS — L0291 Cutaneous abscess, unspecified: Secondary | ICD-10-CM | POA: Diagnosis not present

## 2016-01-08 DIAGNOSIS — Z79899 Other long term (current) drug therapy: Secondary | ICD-10-CM | POA: Diagnosis not present

## 2016-01-08 DIAGNOSIS — I10 Essential (primary) hypertension: Secondary | ICD-10-CM | POA: Diagnosis present

## 2016-01-08 DIAGNOSIS — F17210 Nicotine dependence, cigarettes, uncomplicated: Secondary | ICD-10-CM | POA: Diagnosis not present

## 2016-01-08 DIAGNOSIS — L03314 Cellulitis of groin: Secondary | ICD-10-CM | POA: Diagnosis not present

## 2016-01-08 LAB — CBC WITH DIFFERENTIAL/PLATELET
Basophils Absolute: 0 10*3/uL (ref 0.0–0.1)
Basophils Relative: 0 %
Eosinophils Absolute: 0.1 10*3/uL (ref 0.0–0.7)
Eosinophils Relative: 2 %
HEMATOCRIT: 37.1 % (ref 36.0–46.0)
Hemoglobin: 11.6 g/dL — ABNORMAL LOW (ref 12.0–15.0)
LYMPHS ABS: 1.6 10*3/uL (ref 0.7–4.0)
LYMPHS PCT: 21 %
MCH: 29.9 pg (ref 26.0–34.0)
MCHC: 31.3 g/dL (ref 30.0–36.0)
MCV: 95.6 fL (ref 78.0–100.0)
MONO ABS: 0.7 10*3/uL (ref 0.1–1.0)
MONOS PCT: 9 %
NEUTROS ABS: 5.2 10*3/uL (ref 1.7–7.7)
Neutrophils Relative %: 68 %
Platelets: 238 10*3/uL (ref 150–400)
RBC: 3.88 MIL/uL (ref 3.87–5.11)
RDW: 13.7 % (ref 11.5–15.5)
WBC: 7.6 10*3/uL (ref 4.0–10.5)

## 2016-01-08 LAB — BASIC METABOLIC PANEL
ANION GAP: 5 (ref 5–15)
BUN: 29 mg/dL — ABNORMAL HIGH (ref 6–20)
CALCIUM: 8.4 mg/dL — AB (ref 8.9–10.3)
CO2: 26 mmol/L (ref 22–32)
CREATININE: 0.76 mg/dL (ref 0.44–1.00)
Chloride: 108 mmol/L (ref 101–111)
GFR calc Af Amer: >60 mL/min (ref >60)
GFR calc non Af Amer: >60 mL/min (ref >60)
GLUCOSE: 94 mg/dL (ref 65–99)
Potassium: 4 mmol/L (ref 3.5–5.1)
Sodium: 139 mmol/L (ref 135–145)

## 2016-01-08 MED ORDER — CLINDAMYCIN PHOSPHATE 600 MG/50ML IV SOLN
INTRAVENOUS | Status: AC
  Filled 2016-01-08: qty 50

## 2016-01-08 MED ORDER — ALPRAZOLAM 1 MG PO TABS
1.0000 mg | ORAL_TABLET | Freq: Four times a day (QID) | ORAL | Status: DC | PRN
  Administered 2016-01-08 – 2016-01-09 (×2): 1 mg via ORAL
  Filled 2016-01-08 (×2): qty 1

## 2016-01-08 MED ORDER — LIDOCAINE-EPINEPHRINE (PF) 1 %-1:200000 IJ SOLN
20.0000 mL | Freq: Once | INTRAMUSCULAR | Status: AC
  Administered 2016-01-08: 20 mL
  Filled 2016-01-08: qty 30

## 2016-01-08 MED ORDER — PANTOPRAZOLE SODIUM 40 MG PO TBEC
80.0000 mg | DELAYED_RELEASE_TABLET | Freq: Every day | ORAL | Status: DC
  Administered 2016-01-08 – 2016-01-09 (×2): 80 mg via ORAL
  Filled 2016-01-08 (×2): qty 2

## 2016-01-08 MED ORDER — LORATADINE 10 MG PO TABS: 10.0000 mg | ORAL_TABLET | Freq: Every day | ORAL | Status: DC

## 2016-01-08 MED ORDER — ENOXAPARIN SODIUM 40 MG/0.4ML ~~LOC~~ SOLN: 40.0000 mg | SUBCUTANEOUS | Status: DC

## 2016-01-08 MED ORDER — MAGNESIUM HYDROXIDE 400 MG/5ML PO SUSP
30.0000 mL | Freq: Every day | ORAL | Status: DC | PRN
Start: 2016-01-08 — End: 2016-01-09

## 2016-01-08 MED ORDER — TRAZODONE HCL 50 MG PO TABS
300.0000 mg | ORAL_TABLET | Freq: Every day | ORAL | Status: DC
  Administered 2016-01-08: 300 mg via ORAL
  Filled 2016-01-08: qty 6

## 2016-01-08 MED ORDER — HYDROCODONE-ACETAMINOPHEN 5-325 MG PO TABS
1.0000 | ORAL_TABLET | ORAL | Status: DC | PRN
  Administered 2016-01-08: 1 via ORAL
  Administered 2016-01-08 – 2016-01-09 (×2): 2 via ORAL
  Administered 2016-01-09 (×2): 1 via ORAL
  Filled 2016-01-08: qty 1
  Filled 2016-01-08: qty 2
  Filled 2016-01-08: qty 1
  Filled 2016-01-08: qty 2
  Filled 2016-01-08: qty 1

## 2016-01-08 MED ORDER — ACETAMINOPHEN 650 MG RE SUPP: 650.0000 mg | Freq: Four times a day (QID) | RECTAL | Status: DC | PRN

## 2016-01-08 MED ORDER — FLUTICASONE PROPIONATE 50 MCG/ACT NA SUSP
2.0000 | Freq: Two times a day (BID) | NASAL | Status: DC | PRN
  Filled 2016-01-08: qty 16

## 2016-01-08 MED ORDER — VALACYCLOVIR HCL 500 MG PO TABS
ORAL_TABLET | ORAL | Status: AC
  Filled 2016-01-08: qty 2

## 2016-01-08 MED ORDER — LORATADINE 10 MG PO TABS
10.0000 mg | ORAL_TABLET | Freq: Every day | ORAL | Status: DC
  Administered 2016-01-09: 10 mg via ORAL
  Filled 2016-01-08: qty 1

## 2016-01-08 MED ORDER — ACETAMINOPHEN 325 MG PO TABS: 650.0000 mg | ORAL_TABLET | Freq: Four times a day (QID) | ORAL | Status: DC | PRN

## 2016-01-08 MED ORDER — LIDOCAINE-EPINEPHRINE (PF) 2 %-1:200000 IJ SOLN: 20.0000 mL | Freq: Once | INTRAMUSCULAR | Status: DC

## 2016-01-08 MED ORDER — LIDOCAINE-EPINEPHRINE (PF) 1 %-1:200000 IJ SOLN
INTRAMUSCULAR | Status: AC
  Filled 2016-01-08: qty 30

## 2016-01-08 MED ORDER — GABAPENTIN 400 MG PO CAPS
400.0000 mg | ORAL_CAPSULE | Freq: Three times a day (TID) | ORAL | Status: DC
  Administered 2016-01-08 – 2016-01-09 (×4): 400 mg via ORAL
  Filled 2016-01-08 (×4): qty 1

## 2016-01-08 MED ORDER — TRAMADOL HCL 50 MG PO TABS: 50.0000 mg | ORAL_TABLET | Freq: Two times a day (BID) | ORAL | Status: DC | PRN

## 2016-01-08 MED ORDER — VANCOMYCIN HCL IN DEXTROSE 1-5 GM/200ML-% IV SOLN
1000.0000 mg | Freq: Once | INTRAVENOUS | Status: AC
  Administered 2016-01-08: 1000 mg via INTRAVENOUS
  Filled 2016-01-08: qty 200

## 2016-01-08 MED ORDER — DICLOFENAC SODIUM 1 % TD GEL
2.0000 g | Freq: Four times a day (QID) | TRANSDERMAL | Status: DC
  Filled 2016-01-08: qty 100

## 2016-01-08 MED ORDER — ENOXAPARIN SODIUM 60 MG/0.6ML ~~LOC~~ SOLN
60.0000 mg | SUBCUTANEOUS | Status: DC
Start: 2016-01-08 — End: 2016-01-09
  Filled 2016-01-08: qty 0.6

## 2016-01-08 MED ORDER — VALACYCLOVIR HCL 500 MG PO TABS
1000.0000 mg | ORAL_TABLET | Freq: Two times a day (BID) | ORAL | Status: DC
  Administered 2016-01-08 – 2016-01-09 (×2): 1000 mg via ORAL
  Filled 2016-01-08 (×6): qty 2

## 2016-01-08 MED ORDER — CLINDAMYCIN PHOSPHATE 600 MG/50ML IV SOLN
600.0000 mg | Freq: Three times a day (TID) | INTRAVENOUS | Status: DC
  Administered 2016-01-08 – 2016-01-09 (×2): 600 mg via INTRAVENOUS
  Filled 2016-01-08 (×9): qty 50

## 2016-01-08 MED ORDER — CLINDAMYCIN PHOSPHATE 600 MG/50ML IV SOLN
600.0000 mg | Freq: Three times a day (TID) | INTRAVENOUS | Status: DC
  Filled 2016-01-08 (×9): qty 50

## 2016-01-08 MED ORDER — LAMOTRIGINE 100 MG PO TABS
100.0000 mg | ORAL_TABLET | Freq: Two times a day (BID) | ORAL | Status: DC
  Administered 2016-01-08 – 2016-01-09 (×2): 100 mg via ORAL
  Filled 2016-01-08 (×2): qty 1
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
## 2016-01-08 NOTE — H&P (Signed)
History and Physical    Annette Galvan [id number] DOB: 04/03/1961 DOA: 01/08/2016  PCP: Vic Blackbird, MD  Patient coming from: Home  Chief Complaint: Groin pain and redness  HPI: Annette Galvan is a 55 y.o. female with medical history significant of bipolar disorder, PTSD, elevated blood pressure but no diagnosis of hypertension presents for redness and pain in the pubic area for the past 6 days.  Patient states she shaved her pubic area 10 days ago and then reshaved 6 days ago.  She reports that between the first and second shaves she developed a small hardened "knot" in her pelvic area that became more swollen.  Patient reports she kept picking at the area until it ultimately opened and started draining.  She states that the draining material was mostly blood without any signs of purulence.  No fevers, but subjective chills.  Never had a history of this before.  Some occasional nausea but no vomiting.  No history of MRSA, no red or streaking down the leg or up the abdomen.  No chest pain, chest pressure, shortness of breath, increased work of breathing.  Does have a wound on her right lower extremity from dropping a hot kettle on her leg.   ED Course: Patient was seen and evaluated.  Laboratory data was gathered and was reassuring in that patient did have not have an elevated WBC or left shift.  Patient electrolytes were within normal limits and her vital signs were stable.  Review of Systems: As per HPI otherwise 10 point review of systems negative.    Past Medical History:  Diagnosis Date  . Arthritis    needs 2 knee replacements  . Back pain, chronic   . Bilateral chronic knee pain   . Bipolar 1 disorder ([place])   . Bulging disc   . Fibromyalgia   . GERD (gastroesophageal reflux disease)   . HSV-2 (herpes simplex virus 2) infection 2013  . Hx of trichomoniasis   . Itching 06/26/2013  . Leg pain   . Trichimoniasis 06/26/2013  . UTI (lower urinary tract infection) 10/12/2012  .  Vaginal discharge 06/26/2013  . Vaginal irritation 12/05/2013  . Vaginal odor 12/28/2012   Had discharge +clue  . Yeast infection 02/04/2013    Past Surgical History:  Procedure Laterality Date  . ABDOMINAL HYSTERECTOMY    . BREAST ENHANCEMENT SURGERY Right   . COLONOSCOPY WITH PROPOFOL N/A 12/07/2015   Procedure: COLONOSCOPY WITH PROPOFOL;  Surgeon: Daneil Dolin, MD;  Location: [facility];  Service: Endoscopy;  Laterality: N/A;  915  . TUBAL LIGATION       reports that she has been smoking Cigarettes.  She has a 6.25 pack-year smoking history. She has never used smokeless tobacco. She reports that she drinks alcohol. She reports that she does not use drugs.  Allergies  Allergen Reactions  . Latex Swelling    Ankle Area  . Ampicillin Hives and Nausea And Vomiting    Family History  Problem Relation Age of Onset  . Hypertension Mother   . Diabetes Mother   . Depression Mother   . Hyperlipidemia Mother   . Fibromyalgia Mother   . Arthritis Mother   . Cancer Father     bone  . Hyperlipidemia Father   . Hypertension Father   . Bipolar disorder Father   . Depression Sister   . Hyperlipidemia Sister   . Fibromyalgia Sister   . Fibromyalgia Sister   . Bipolar disorder Other   .  Drug abuse Other   . Alcohol abuse Other   . Hypertension Brother   . Other Brother     shingles; back problems  . Diabetes Paternal Grandmother   . Alzheimer's disease Maternal Grandmother   . Obesity Daughter   . Other Daughter     back problems  . Bipolar disorder Daughter   . Depression Daughter   . Other Daughter     on pain meds  . Colon cancer Neg Hx     Prior to Admission medications   Medication Sig Start Date End Date Taking? Authorizing Provider  ALPRAZolam Duanne Moron) 1 MG tablet Take 1 tablet (1 mg total) by mouth 4 (four) times daily as needed for anxiety. 11/12/15  Yes Cloria Spring, MD  cetirizine (ZYRTEC) 10 MG tablet TAKE 1 TABLET BY MOUTH ONCE DAILY. 04/30/15  Yes Alycia Rossetti, MD  diclofenac sodium (VOLTAREN) 1 % GEL APPLY 2 GRAMS TO THE AFFECTED AREAS FOUR TIMES DAILY AS DIRECTED. 01/01/16  Yes Alycia Rossetti, MD  fluticasone [facility]) 50 MCG/ACT nasal spray Place 2 sprays into both nostrils 2 (two) times daily as needed for allergies or rhinitis. 10/06/15  Yes Alycia Rossetti, MD  gabapentin (NEURONTIN) 400 MG capsule TAKE 1 CAPSULE BY MOUTH THREE TIMES DAILY. 01/06/16  Yes Alycia Rossetti, MD  lamoTRIgine (LAMICTAL) 100 MG tablet Take 1 tablet (100 mg total) by mouth 2 (two) times daily. 11/12/15  Yes Cloria Spring, MD  meloxicam (MOBIC) 15 MG tablet TAKE 1 TABLET BY MOUTH ONCE A DAY. 10/07/15  Yes Alycia Rossetti, MD  naproxen (NAPROSYN) 500 MG tablet TAKE (1) TABLET BY MOUTH TWICE DAILY WITH MEALS. 12/01/15  Yes Alycia Rossetti, MD  nystatin (MYCOSTATIN) 100000 UNIT/ML suspension Take 30 mLs by mouth daily as needed for irritation. 12/15/15  Yes Historical Provider, MD  omeprazole (PRILOSEC) 40 MG capsule TAKE (1) CAPSULE BY MOUTH ONCE DAILY FOR ACID REFLUX. 12/25/15  Yes Alycia Rossetti, MD  traMADol (ULTRAM) 50 MG tablet TAKE 1 TABLET BY MOUTH EVERY 12 HOURS AS NEEDED FOR PAIN. 12/01/15  Yes Alycia Rossetti, MD  traZODone (DESYREL) 150 MG tablet Take 2 tablets (300 mg total) by mouth at bedtime. 11/12/15  Yes Cloria Spring, MD  valACYclovir (VALTREX) 1000 MG tablet Take 1 tablet by mouth 2 (two) times daily. 11/17/15  Yes Historical Provider, MD  cephALEXin (KEFLEX) 500 MG capsule Take 1 capsule (500 mg total) by mouth 4 (four) times daily. Patient not taking: Reported on 01/08/2016 11/25/15   Alycia Rossetti, MD  ibuprofen (ADVIL,MOTRIN) 800 MG tablet TAKE ONE TABLET BY MOUTH TWICE DAILY AS NEEDED FOR PAIN. Patient not taking: Reported on 01/08/2016 01/05/16   Alycia Rossetti, MD    Physical Exam: Vitals:   01/08/16 1100 01/08/16 1453 01/08/16 1500 01/08/16 1600  BP:  126/69 115/77   Pulse:  70 85 78  Resp:  18 18 18   Temp:  97.7 F (36.5 C)      TempSrc:  Oral    SpO2:  100% 97% 96%  Weight: 127.5 kg (281 lb)     Height: 5\' 3"  (1.6 m)         Constitutional: NAD, calm, comfortable Vitals:   01/08/16 1100 01/08/16 1453 01/08/16 1500 01/08/16 1600  BP:  126/69 115/77   Pulse:  70 85 78  Resp:  18 18 18   Temp:  97.7 F (36.5 C)    TempSrc:  Oral  SpO2:  100% 97% 96%  Weight: 127.5 kg (281 lb)     Height: 5\' 3"  (1.6 m)      Eyes: PERRL, lids and conjunctivae normal ENMT: Mucous membranes are moist. Posterior pharynx clear of any exudate or lesions.Normal dentition.  Neck: normal, supple, no masses, no thyromegaly Respiratory: clear to auscultation bilaterally, no wheezing, no crackles. Normal respiratory effort. No accessory muscle use.  Cardiovascular: Regular rate and rhythm, no murmurs / rubs / gallops. No extremity edema. 2+ pedal pulses. No carotid bruits.  Abdomen: no masses palpated. No hepatosplenomegaly. Bowel sounds positive. Tender in suprapubic and on pubis at site of cellulitis Musculoskeletal: no clubbing / cyanosis. No joint deformity upper and lower extremities. Good ROM, no contractures. Normal muscle tone.  Skin: 4cm indurated area of over mons with erythema but no purulent drainage. Neurologic: CN 2-12 grossly intact. Sensation intact, DTR normal. Strength 5/5 in all 4.  Psychiatric: Normal judgment and insight. Alert and oriented x 3. Normal mood.    Labs on Admission: I have personally reviewed following labs and imaging studies  CBC:  Recent Labs Lab 01/08/16 1203  WBC 7.6  NEUTROABS 5.2  HGB 11.6*  HCT 37.1  MCV 95.6  PLT 99991111   Basic Metabolic Panel:  Recent Labs Lab 01/08/16 1203  NA 139  K 4.0  CL 108  CO2 26  GLUCOSE 94  BUN 29*  CREATININE 0.76  CALCIUM 8.4*   GFR: Estimated Creatinine Clearance: 104.6 mL/min (by C-G formula based on SCr of 0.76 mg/dL). Liver Function Tests: No results for input(s): AST, ALT, ALKPHOS, BILITOT, PROT, ALBUMIN in the last 168 hours. No  results for input(s): LIPASE, AMYLASE in the last 168 hours. No results for input(s): AMMONIA in the last 168 hours. Coagulation Profile: No results for input(s): INR, PROTIME in the last 168 hours. Cardiac Enzymes: No results for input(s): CKTOTAL, CKMB, CKMBINDEX, TROPONINI in the last 168 hours. BNP (last 3 results) No results for input(s): PROBNP in the last 8760 hours. HbA1C: No results for input(s): HGBA1C in the last 72 hours. CBG: No results for input(s): GLUCAP in the last 168 hours. Lipid Profile: No results for input(s): CHOL, HDL, LDLCALC, TRIG, CHOLHDL, LDLDIRECT in the last 72 hours. Thyroid Function Tests: No results for input(s): TSH, T4TOTAL, FREET4, T3FREE, THYROIDAB in the last 72 hours. Anemia Panel: No results for input(s): VITAMINB12, FOLATE, FERRITIN, TIBC, IRON, RETICCTPCT in the last 72 hours. Urine analysis:    Component Value Date/Time   COLORURINE YELLOW 11/17/2015 [place] 11/17/2015 1232   LABSPEC 1.020 11/17/2015 1232   PHURINE 7.0 11/17/2015 1232   GLUCOSEU NEGATIVE 11/17/2015 1232   HGBUR TRACE (A) 11/17/2015 1232   BILIRUBINUR NEGATIVE 11/17/2015 1232   BILIRUBINUR neg 02/10/2012 1537   KETONESUR NEGATIVE 11/17/2015 1232   PROTEINUR NEGATIVE 11/17/2015 1232   UROBILINOGEN 0.2 01/17/2014 1123   NITRITE NEGATIVE 11/17/2015 1232   LEUKOCYTESUR TRACE (A) 11/17/2015 1232   Sepsis Labs: !!!!!!!!!!!!!!!!!!!!!!!!!!!!!!!!!!!!!!!!!!!! @LABRCNTIP (procalcitonin:4,lacticidven:4) )No results found for this or any previous visit (from the past 240 hour(s)).   Radiological Exams on Admission: No results found.  EKG: not performed  Assessment/Plan Principal Problem:   Cellulitis and abscess Active Problems:   Borderline diabetic   Bipolar 1 disorder (HCC)   Elevated blood pressure (not hypertension)   Cellulitis   Cellulitis - non purulent by both patient report and s/p I&D in the emergency department - no fluctuance - will  order Clindamycin IV  - pending response  can transition her to PO tomorrow - will order MR to ensure no deeper abscess or gas   Bipolar disorder - continue home medication of lamictal, xanax and desyrel  Chronic pain - continue tramadol, gabapentin and diclofenac     DVT prophylaxis: Lovenox and Foot Pulse Code Status: Full Code Family Communication: Discussed with patient's friend who is bedside Disposition Plan: Plan to discharge home in 24-48 hours Consults called: none Admission status: Observation   Newman Pies MD [facility] Pager [phone]  If 7PM-7AM, please contact night-coverage [email or web address] Password [email or web address]  01/08/2016, 4:05 PM

## 2016-01-08 NOTE — ED Triage Notes (Signed)
Pt has burn to lower right shin from 2 weeks ago. Area is now reddened and irritated. In addition, pt has abscess to her right groin that she states is bleeding. NAD noted. Pt ambulatory.

## 2016-01-08 NOTE — ED Notes (Signed)
Report given to [place], RN for room 322.

## 2016-01-08 NOTE — ED Provider Notes (Signed)
[facility] DEPT Provider Note   CSN: [id number] Arrival date & time: 01/08/16  1050     History   Chief Complaint Chief Complaint  Patient presents with  . Abscess  . Burn    HPI Annette Galvan is a 55 y.o. female present with pmhx significant for Bipolar disorder presenting for abscess. She states that about 2 weeks ago she was shaving her pubic hairs and developed a small red knot.This area of redness progressed over the next couple of weeks in size and tenderness. No fever, chills, nausea or vomiting. Denies any hx of diabetes.   Patient also notes that she burned her left leg with hot kettle about two weeks, She has been putting iodine and thinks that it is healing up well.    HPI  Past Medical History:  Diagnosis Date  . Arthritis    needs 2 knee replacements  . Back pain, chronic   . Bilateral chronic knee pain   . Bipolar 1 disorder ([place])   . Bulging disc   . Fibromyalgia   . GERD (gastroesophageal reflux disease)   . HSV-2 (herpes simplex virus 2) infection 2013  . Hx of trichomoniasis   . Itching 06/26/2013  . Leg pain   . Trichimoniasis 06/26/2013  . UTI (lower urinary tract infection) 10/12/2012  . Vaginal discharge 06/26/2013  . Vaginal irritation 12/05/2013  . Vaginal odor 12/28/2012   Had discharge +clue  . Yeast infection 02/04/2013    Patient Active Problem List   Diagnosis Date Noted  . Cellulitis and abscess 01/08/2016  . Polypharmacy 11/13/2015  . Subclinical hyperthyroidism 08/14/2015  . GERD (gastroesophageal reflux disease) 11/12/2014  . Breast asymmetry between native breast and reconstructed breast 05/26/2014  . Thoracic back pain 05/26/2014  . Inversion deformity of right foot 05/26/2014  . Trichimoniasis 06/26/2013  . Itching 06/26/2013  . OA (osteoarthritis) of knee 05/17/2013  . Acute sinusitis 05/17/2013  . Encounter for screening colonoscopy 12/26/2012  . Stye 09/13/2012  . Sleep apnea 09/13/2012  . Tobacco use 09/13/2012  .  Elevated blood pressure (not hypertension) 02/10/2012  . Fibromyalgia 10/11/2011  . Morbid obesity ([place]) 08/29/2011  . Borderline diabetic 08/29/2011  . Bipolar 1 disorder ([place]) 08/29/2011  . Difficulty in walking(719.7) 05/10/2011  . DDD (degenerative disc disease), lumbar 04/04/2007    Past Surgical History:  Procedure Laterality Date  . ABDOMINAL HYSTERECTOMY    . BREAST ENHANCEMENT SURGERY Right   . COLONOSCOPY WITH PROPOFOL N/A 12/07/2015   Procedure: COLONOSCOPY WITH PROPOFOL;  Surgeon: Daneil Dolin, MD;  Location: [facility];  Service: Endoscopy;  Laterality: N/A;  915  . TUBAL LIGATION      OB History    Gravida Para Term Preterm AB Living   2 2       2    SAB TAB Ectopic Multiple Live Births           2       Home Medications    Prior to Admission medications   Medication Sig Start Date End Date Taking? Authorizing Provider  ALPRAZolam Duanne Moron) 1 MG tablet Take 1 tablet (1 mg total) by mouth 4 (four) times daily as needed for anxiety. 11/12/15  Yes Cloria Spring, MD  cetirizine (ZYRTEC) 10 MG tablet TAKE 1 TABLET BY MOUTH ONCE DAILY. 04/30/15  Yes Alycia Rossetti, MD  diclofenac sodium (VOLTAREN) 1 % GEL APPLY 2 GRAMS TO THE AFFECTED AREAS FOUR TIMES DAILY AS DIRECTED. 01/01/16  Yes Alycia Rossetti,  MD  fluticasone (FLONASE) 50 MCG/ACT nasal spray Place 2 sprays into both nostrils 2 (two) times daily as needed for allergies or rhinitis. 10/06/15  Yes Alycia Rossetti, MD  gabapentin (NEURONTIN) 400 MG capsule TAKE 1 CAPSULE BY MOUTH THREE TIMES DAILY. 01/06/16  Yes Alycia Rossetti, MD  lamoTRIgine (LAMICTAL) 100 MG tablet Take 1 tablet (100 mg total) by mouth 2 (two) times daily. 11/12/15  Yes Cloria Spring, MD  meloxicam (MOBIC) 15 MG tablet TAKE 1 TABLET BY MOUTH ONCE A DAY. 10/07/15  Yes Alycia Rossetti, MD  naproxen (NAPROSYN) 500 MG tablet TAKE (1) TABLET BY MOUTH TWICE DAILY WITH MEALS. 12/01/15  Yes Alycia Rossetti, MD  nystatin (MYCOSTATIN) 100000 UNIT/ML  suspension Take 30 mLs by mouth daily as needed for irritation. 12/15/15  Yes Historical Provider, MD  omeprazole (PRILOSEC) 40 MG capsule TAKE (1) CAPSULE BY MOUTH ONCE DAILY FOR ACID REFLUX. 12/25/15  Yes Alycia Rossetti, MD  traMADol (ULTRAM) 50 MG tablet TAKE 1 TABLET BY MOUTH EVERY 12 HOURS AS NEEDED FOR PAIN. 12/01/15  Yes Alycia Rossetti, MD  traZODone (DESYREL) 150 MG tablet Take 2 tablets (300 mg total) by mouth at bedtime. 11/12/15  Yes Cloria Spring, MD  valACYclovir (VALTREX) 1000 MG tablet Take 1 tablet by mouth 2 (two) times daily. 11/17/15  Yes Historical Provider, MD  cephALEXin (KEFLEX) 500 MG capsule Take 1 capsule (500 mg total) by mouth 4 (four) times daily. Patient not taking: Reported on 01/08/2016 11/25/15   Alycia Rossetti, MD  ibuprofen (ADVIL,MOTRIN) 800 MG tablet TAKE ONE TABLET BY MOUTH TWICE DAILY AS NEEDED FOR PAIN. Patient not taking: Reported on 01/08/2016 01/05/16   Alycia Rossetti, MD    Family History Family History  Problem Relation Age of Onset  . Hypertension Mother   . Diabetes Mother   . Depression Mother   . Hyperlipidemia Mother   . Fibromyalgia Mother   . Arthritis Mother   . Cancer Father     bone  . Hyperlipidemia Father   . Hypertension Father   . Bipolar disorder Father   . Depression Sister   . Hyperlipidemia Sister   . Fibromyalgia Sister   . Fibromyalgia Sister   . Bipolar disorder Other   . Drug abuse Other   . Alcohol abuse Other   . Hypertension Brother   . Other Brother     shingles; back problems  . Diabetes Paternal Grandmother   . Alzheimer's disease Maternal Grandmother   . Obesity Daughter   . Other Daughter     back problems  . Bipolar disorder Daughter   . Depression Daughter   . Other Daughter     on pain meds  . Colon cancer Neg Hx     Social History Social History  Substance Use Topics  . Smoking status: Light Tobacco Smoker    Packs/day: 0.25    Years: 25.00    Types: Cigarettes  . Smokeless tobacco: Never  Used     Comment: 2 cigarettes daily  . Alcohol use 0.0 oz/week     Comment: beer occasionally     Allergies   Latex and Ampicillin   Review of Systems Review of Systems  Constitutional: Negative for chills and fever.  HENT: Negative for congestion and sore throat.   Eyes: Negative for visual disturbance.  Respiratory: Negative for cough and shortness of breath.   Cardiovascular: Negative for chest pain and leg swelling.  Gastrointestinal: Negative for nausea  and vomiting.  Genitourinary: Negative for dysuria and flank pain.  Musculoskeletal: Negative for arthralgias and myalgias.  Skin: Positive for color change.  Neurological: Negative for dizziness and headaches.     Physical Exam Updated Vital Signs BP 126/69 (BP Location: Left Arm)   Pulse 70   Temp 97.7 F (36.5 C) (Oral)   Resp 18   Ht 5\' 3"  (1.6 m)   Wt 127.5 kg   SpO2 100%   BMI 49.78 kg/m   Physical Exam  Constitutional: She is oriented to person, place, and time. She appears well-developed and well-nourished.  HENT:  Head: Normocephalic and atraumatic.  Eyes: EOM are normal. Pupils are equal, round, and reactive to light.  Neck: Normal range of motion. Neck supple.  Cardiovascular: Normal rate, regular rhythm, normal heart sounds and intact distal pulses.   Pulmonary/Chest: Effort normal and breath sounds normal.  Abdominal: Soft. Bowel sounds are normal.  Musculoskeletal: Normal range of motion.  Neurological: She is alert and oriented to person, place, and time.  Skin:  Suprapubic area of induration on the mons pubis about 3 x 3 cm in size, surrounding cellulitis about 10 cm in diameter around suprapubic area. Warm to touch and slightly tender to palpation.   Scabbing 4 cm x 3 cm lesion on left lower extremity with no signs of infection.      ED Treatments / Results  Labs (all labs ordered are listed, but only abnormal results are displayed) Labs Reviewed  CBC WITH DIFFERENTIAL/PLATELET -  Abnormal; Notable for the following:       Result Value   Hemoglobin 11.6 (*)    All other components within normal limits  BASIC METABOLIC PANEL - Abnormal; Notable for the following:    BUN 29 (*)    Calcium 8.4 (*)    All other components within normal limits    EKG  EKG Interpretation None       Radiology No results found.  Procedures .Marland KitchenIncision and Drainage Date/Time: 01/08/2016 3:43 PM Performed by: Tonette Bihari Authorized by: Wallis Bamberg   Consent:    Consent obtained:  Verbal   Consent given by:  Patient   Alternatives discussed:  No treatment Location:    Type:  Abscess   Location:  Trunk   Trunk location: Pelvic. Pre-procedure details:    Skin preparation:  Betadine Anesthesia (see MAR for exact dosages):    Anesthesia method:  Local infiltration   Local anesthetic:  Lidocaine 2% WITH epi Procedure type:    Complexity:  Simple Procedure details:    Needle aspiration: no     Incision types:  Stab incision (Bedside ultrasound guided )   Incision depth:  Submucosal   Scalpel blade:  10   Wound management:  Probed and deloculated and irrigated with saline   Drainage:  Serosanguinous and bloody   Drainage amount:  Moderate   Wound treatment:  Wound left open   Packing materials:  1/4 in iodoform gauze Post-procedure details:    Patient tolerance of procedure:  Tolerated well, no immediate complications   (including critical care time)  Medications Ordered in ED Medications  lidocaine-EPINEPHrine (XYLOCAINE W/EPI) 2 %-1:200000 (PF) injection 20 mL (20 mLs Infiltration Not Given 01/08/16 1459)  lidocaine-EPINEPHrine (XYLOCAINE-EPINEPHrine) 1 %-1:200000 (PF) injection 20 mL (20 mLs Infiltration Given by Other 01/08/16 1143)  vancomycin (VANCOCIN) IVPB 1000 mg/200 mL premix (1,000 mg Intravenous New Bag/Given 01/08/16 1451)     Initial Impression / Assessment and Plan / ED Course  I have reviewed the triage vital signs and the nursing  notes.  Pertinent labs & imaging results that were available during my care of the patient were reviewed by me and considered in my medical decision making (see chart for details).  Clinical Course   Patient presenting with abscess and cellulitis around the suprapubic area. I&D of abscess was performed with limited drainage.Provided IV vancomycin, concern for progression of cellulitis, therefore will admit for IV antibiotics and observation.   Final Clinical Impressions(s) / ED Diagnoses   Final diagnoses:  Abscess  Cellulitis of groin    New Prescriptions New Prescriptions   No medications on file     Ronita Hargreaves Cletis Media, MD 01/08/16 [place] Cletis Media, MD 01/08/16 [place], MD 01/08/16 [place], MD 01/08/16 1744

## 2016-01-09 DIAGNOSIS — L03314 Cellulitis of groin: Secondary | ICD-10-CM | POA: Diagnosis not present

## 2016-01-09 DIAGNOSIS — R7303 Prediabetes: Secondary | ICD-10-CM | POA: Diagnosis not present

## 2016-01-09 DIAGNOSIS — F319 Bipolar disorder, unspecified: Secondary | ICD-10-CM | POA: Diagnosis not present

## 2016-01-09 LAB — BASIC METABOLIC PANEL
Anion gap: 4 — ABNORMAL LOW (ref 5–15)
BUN: 22 mg/dL — ABNORMAL HIGH (ref 6–20)
CALCIUM: 8 mg/dL — AB (ref 8.9–10.3)
CO2: 29 mmol/L (ref 22–32)
CREATININE: 0.58 mg/dL (ref 0.44–1.00)
Chloride: 107 mmol/L (ref 101–111)
GFR calc non Af Amer: >60 mL/min (ref >60)
Glucose, Bld: 100 mg/dL — ABNORMAL HIGH (ref 65–99)
Potassium: 4 mmol/L (ref 3.5–5.1)
Sodium: 140 mmol/L (ref 135–145)

## 2016-01-09 LAB — HIV ANTIBODY (ROUTINE TESTING W REFLEX): HIV Screen 4th Generation wRfx: NONREACTIVE

## 2016-01-09 LAB — DIFFERENTIAL
BASOS ABS: 0 10*3/uL (ref 0.0–0.1)
BASOS PCT: 0 %
EOS ABS: 0.3 10*3/uL (ref 0.0–0.7)
Eosinophils Relative: 4 %
Lymphocytes Relative: 32 %
Lymphs Abs: 2 10*3/uL (ref 0.7–4.0)
MONO ABS: 0.6 10*3/uL (ref 0.1–1.0)
MONOS PCT: 10 %
Neutro Abs: 3.3 10*3/uL (ref 1.7–7.7)
Neutrophils Relative %: 54 %

## 2016-01-09 LAB — CBC
HEMATOCRIT: 38.3 % (ref 36.0–46.0)
Hemoglobin: 11.8 g/dL — ABNORMAL LOW (ref 12.0–15.0)
MCH: 29.9 pg (ref 26.0–34.0)
MCHC: 30.8 g/dL (ref 30.0–36.0)
MCV: 97.2 fL (ref 78.0–100.0)
Platelets: 255 10*3/uL (ref 150–400)
RBC: 3.94 MIL/uL (ref 3.87–5.11)
RDW: 13.7 % (ref 11.5–15.5)
WBC: 6.1 10*3/uL (ref 4.0–10.5)

## 2016-01-09 LAB — HEPATIC FUNCTION PANEL
ALT: 11 U/L — ABNORMAL LOW (ref 14–54)
AST: 14 U/L — AB (ref 15–41)
Albumin: 3.3 g/dL — ABNORMAL LOW (ref 3.5–5.0)
Alkaline Phosphatase: 59 U/L (ref 38–126)
Total Bilirubin: 0.2 mg/dL — ABNORMAL LOW (ref 0.3–1.2)
Total Protein: 6.6 g/dL (ref 6.5–8.1)

## 2016-01-09 MED ORDER — CLINDAMYCIN HCL 300 MG PO CAPS: 600.0000 mg | ORAL_CAPSULE | Freq: Three times a day (TID) | ORAL | 0 refills | Status: AC

## 2016-01-09 MED ORDER — CLINDAMYCIN HCL 150 MG PO CAPS
600.0000 mg | ORAL_CAPSULE | Freq: Three times a day (TID) | ORAL | Status: DC
  Administered 2016-01-09: 600 mg via ORAL
  Filled 2016-01-09: qty 4
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
## 2016-01-09 NOTE — Progress Notes (Signed)
Patient discharged home.  IV removed - WNL.  Reviewed DC instructions and medications.  No questions at this time.  Assisted off unit in NAD

## 2016-01-09 NOTE — Care Management Obs Status (Signed)
[place] NOTIFICATION   Patient Details  Name: Annette Galvan MRN: [id number] Date of Birth: 1961-03-03   Medicare Observation Status Notification Given:  Yes    Briant Sites, RN 01/09/2016, 2:17 PM

## 2016-01-11 ENCOUNTER — Ambulatory Visit: Payer: Self-pay | Admitting: Family Medicine

## 2016-01-12 ENCOUNTER — Ambulatory Visit (INDEPENDENT_AMBULATORY_CARE_PROVIDER_SITE_OTHER): Payer: Medicare Other | Admitting: Family Medicine

## 2016-01-12 ENCOUNTER — Encounter: Payer: Self-pay | Admitting: Family Medicine

## 2016-01-12 VITALS — BP 134/82 | HR 70 | Temp 98.2°F | Resp 16 | Ht 63.0 in | Wt 279.0 lb

## 2016-01-12 DIAGNOSIS — T24201A Burn of second degree of unspecified site of right lower limb, except ankle and foot, initial encounter: Secondary | ICD-10-CM

## 2016-01-12 DIAGNOSIS — L0291 Cutaneous abscess, unspecified: Secondary | ICD-10-CM

## 2016-01-12 DIAGNOSIS — L0390 Cellulitis, unspecified: Secondary | ICD-10-CM | POA: Diagnosis not present

## 2016-01-12 MED ORDER — HYDROCODONE-ACETAMINOPHEN 7.5-325 MG PO TABS: 1.0000 | ORAL_TABLET | Freq: Four times a day (QID) | ORAL | 0 refills | Status: DC | PRN

## 2016-01-12 MED ORDER — VALACYCLOVIR HCL 1 G PO TABS: 1000.0000 mg | ORAL_TABLET | Freq: Two times a day (BID) | ORAL | 3 refills | Status: DC

## 2016-01-12 MED ORDER — LORATADINE 10 MG PO TABS: 10.0000 mg | ORAL_TABLET | Freq: Every day | ORAL | 6 refills | Status: DC
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
## 2016-01-12 NOTE — Patient Instructions (Signed)
Complete your antibiotics Phenergan for nausea  Take pain medicine hydrocodone, DO NOT take with Ultram Referral to wound center  Remove 2 inches of packing on Wed, and Friday, do this every 2 days until gone

## 2016-01-12 NOTE — Assessment & Plan Note (Signed)
COmplete antibiotics, discussed wound care and packing removal, had premature closing since she removed packing

## 2016-01-13 ENCOUNTER — Telehealth: Payer: Self-pay | Admitting: *Deleted

## 2016-01-13 LAB — CULTURE, BLOOD (ROUTINE X 2)
CULTURE: NO GROWTH
Culture: NO GROWTH

## 2016-01-13 MED ORDER — PROMETHAZINE HCL 25 MG PO TABS: 25.0000 mg | ORAL_TABLET | Freq: Three times a day (TID) | ORAL | 0 refills | Status: DC | PRN

## 2016-01-13 NOTE — Telephone Encounter (Signed)
Received call from patient.   Reports that she went to pharmacy to pick up prescriptions.   States that she thought she was to get a prescription for itching, but nothing was there for itching.   Also reports phenergan was not prescribed. Prescription sent to pharmacy.

## 2016-01-14 ENCOUNTER — Ambulatory Visit (INDEPENDENT_AMBULATORY_CARE_PROVIDER_SITE_OTHER): Payer: Medicare Other | Admitting: Psychiatry

## 2016-01-14 ENCOUNTER — Encounter (HOSPITAL_COMMUNITY): Payer: Self-pay | Admitting: Psychiatry

## 2016-01-14 VITALS — BP 111/87 | HR 78 | Ht 63.0 in | Wt 279.6 lb

## 2016-01-14 DIAGNOSIS — F4310 Post-traumatic stress disorder, unspecified: Secondary | ICD-10-CM

## 2016-01-14 MED ORDER — LAMOTRIGINE 100 MG PO TABS: 100.0000 mg | ORAL_TABLET | Freq: Two times a day (BID) | ORAL | 2 refills | Status: DC

## 2016-01-14 MED ORDER — TRAZODONE HCL 150 MG PO TABS: 300.0000 mg | ORAL_TABLET | Freq: Every day | ORAL | 3 refills | Status: DC

## 2016-01-14 MED ORDER — ALPRAZOLAM 1 MG PO TABS: 1.0000 mg | ORAL_TABLET | Freq: Four times a day (QID) | ORAL | 2 refills | Status: DC | PRN
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
## 2016-01-15 ENCOUNTER — Ambulatory Visit (INDEPENDENT_AMBULATORY_CARE_PROVIDER_SITE_OTHER): Payer: Medicare Other | Admitting: Family Medicine

## 2016-01-15 ENCOUNTER — Ambulatory Visit: Payer: Self-pay | Admitting: Family Medicine

## 2016-01-15 ENCOUNTER — Encounter: Payer: Self-pay | Admitting: Family Medicine

## 2016-01-15 VITALS — BP 130/72 | HR 78 | Temp 98.3°F | Resp 14 | Ht 63.0 in | Wt 279.0 lb

## 2016-01-15 DIAGNOSIS — L0291 Cutaneous abscess, unspecified: Secondary | ICD-10-CM

## 2016-01-15 DIAGNOSIS — T24201D Burn of second degree of unspecified site of right lower limb, except ankle and foot, subsequent encounter: Secondary | ICD-10-CM

## 2016-01-15 DIAGNOSIS — L0390 Cellulitis, unspecified: Secondary | ICD-10-CM

## 2016-01-15 MED ORDER — TRAMADOL HCL 50 MG PO TABS: 50.0000 mg | ORAL_TABLET | Freq: Two times a day (BID) | ORAL | 1 refills | Status: DC | PRN

## 2016-01-15 MED ORDER — FLUTICASONE PROPIONATE 50 MCG/ACT NA SUSP: 2.0000 | Freq: Two times a day (BID) | NASAL | 2 refills | Status: DC | PRN

## 2016-01-15 MED ORDER — SILVER SULFADIAZINE 1 % EX CREA: 1.0000 "application " | TOPICAL_CREAM | Freq: Every day | CUTANEOUS | 0 refills | Status: DC

## 2016-01-15 MED ORDER — CLINDAMYCIN HCL 300 MG PO CAPS: 600.0000 mg | ORAL_CAPSULE | Freq: Three times a day (TID) | ORAL | 0 refills | Status: DC
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
## 2016-01-18 ENCOUNTER — Ambulatory Visit (HOSPITAL_COMMUNITY): Payer: Medicare Other | Attending: Family Medicine | Admitting: Physical Therapy

## 2016-01-18 DIAGNOSIS — X58XXXS Exposure to other specified factors, sequela: Secondary | ICD-10-CM | POA: Insufficient documentation

## 2016-01-18 DIAGNOSIS — M79604 Pain in right leg: Secondary | ICD-10-CM | POA: Diagnosis not present

## 2016-01-18 DIAGNOSIS — S81801S Unspecified open wound, right lower leg, sequela: Secondary | ICD-10-CM | POA: Diagnosis not present
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
## 2016-01-25 ENCOUNTER — Ambulatory Visit (HOSPITAL_COMMUNITY): Payer: Medicare Other | Admitting: Physical Therapy

## 2016-01-25 DIAGNOSIS — M79604 Pain in right leg: Secondary | ICD-10-CM | POA: Diagnosis not present

## 2016-01-25 DIAGNOSIS — S81801S Unspecified open wound, right lower leg, sequela: Secondary | ICD-10-CM
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
## 2016-01-27 ENCOUNTER — Ambulatory Visit (HOSPITAL_COMMUNITY): Payer: Medicare Other | Admitting: Physical Therapy

## 2016-02-01 ENCOUNTER — Ambulatory Visit (HOSPITAL_COMMUNITY): Payer: Self-pay | Admitting: Physical Therapy

## 2016-02-03 ENCOUNTER — Ambulatory Visit (HOSPITAL_COMMUNITY): Payer: Self-pay

## 2016-02-04 ENCOUNTER — Other Ambulatory Visit: Payer: Self-pay | Admitting: Family Medicine

## 2016-02-08 ENCOUNTER — Ambulatory Visit (HOSPITAL_COMMUNITY): Payer: Self-pay | Admitting: Physical Therapy

## 2016-02-09 ENCOUNTER — Other Ambulatory Visit: Payer: Self-pay | Admitting: Family Medicine

## 2016-02-10 ENCOUNTER — Ambulatory Visit (HOSPITAL_COMMUNITY): Payer: Self-pay | Admitting: Physical Therapy

## 2016-02-15 ENCOUNTER — Other Ambulatory Visit: Payer: Self-pay | Admitting: Family Medicine

## 2016-02-15 ENCOUNTER — Ambulatory Visit: Payer: Self-pay | Admitting: Family Medicine

## 2016-02-15 ENCOUNTER — Ambulatory Visit (HOSPITAL_COMMUNITY): Payer: Self-pay | Admitting: Physical Therapy

## 2016-02-15 NOTE — Telephone Encounter (Signed)
Ok to refill??  Last office visit/ refill 01/15/2016, #1 refill.

## 2016-02-15 NOTE — Telephone Encounter (Signed)
okay

## 2016-02-16 ENCOUNTER — Ambulatory Visit: Payer: Self-pay | Admitting: Family Medicine

## 2016-02-16 NOTE — Telephone Encounter (Signed)
Medication called to pharmacy. 

## 2016-02-17 ENCOUNTER — Ambulatory Visit (HOSPITAL_COMMUNITY): Payer: Self-pay | Admitting: Physical Therapy

## 2016-02-22 ENCOUNTER — Ambulatory Visit (HOSPITAL_COMMUNITY): Payer: Self-pay | Admitting: Physical Therapy

## 2016-02-24 ENCOUNTER — Ambulatory Visit (HOSPITAL_COMMUNITY): Payer: Self-pay | Admitting: Physical Therapy

## 2016-02-29 ENCOUNTER — Ambulatory Visit (HOSPITAL_COMMUNITY): Payer: Self-pay | Admitting: Physical Therapy

## 2016-03-12 ENCOUNTER — Other Ambulatory Visit: Payer: Self-pay | Admitting: Family Medicine

## 2016-03-31 ENCOUNTER — Ambulatory Visit (HOSPITAL_COMMUNITY): Payer: Self-pay | Admitting: Psychiatry

## 2016-04-12 ENCOUNTER — Other Ambulatory Visit: Payer: Self-pay | Admitting: Family Medicine

## 2016-04-19 ENCOUNTER — Other Ambulatory Visit: Payer: Self-pay | Admitting: Family Medicine

## 2016-04-20 NOTE — Telephone Encounter (Signed)
Medication called to pharmacy. 

## 2016-04-20 NOTE — Telephone Encounter (Signed)
Approved. # 60 + 0. 

## 2016-04-20 NOTE — Telephone Encounter (Signed)
Ok to refill Tramadol.

## 2016-04-26 ENCOUNTER — Ambulatory Visit (INDEPENDENT_AMBULATORY_CARE_PROVIDER_SITE_OTHER): Payer: Medicare Other | Admitting: Psychiatry

## 2016-04-26 ENCOUNTER — Encounter (HOSPITAL_COMMUNITY): Payer: Self-pay | Admitting: Psychiatry

## 2016-04-26 VITALS — BP 146/74 | HR 93 | Ht 63.0 in | Wt 273.4 lb

## 2016-04-26 DIAGNOSIS — Z811 Family history of alcohol abuse and dependence: Secondary | ICD-10-CM

## 2016-04-26 DIAGNOSIS — Z818 Family history of other mental and behavioral disorders: Secondary | ICD-10-CM

## 2016-04-26 DIAGNOSIS — Z8489 Family history of other specified conditions: Secondary | ICD-10-CM

## 2016-04-26 DIAGNOSIS — Z833 Family history of diabetes mellitus: Secondary | ICD-10-CM

## 2016-04-26 DIAGNOSIS — Z8261 Family history of arthritis: Secondary | ICD-10-CM

## 2016-04-26 DIAGNOSIS — Z79899 Other long term (current) drug therapy: Secondary | ICD-10-CM | POA: Diagnosis not present

## 2016-04-26 DIAGNOSIS — F4310 Post-traumatic stress disorder, unspecified: Secondary | ICD-10-CM | POA: Diagnosis not present

## 2016-04-26 DIAGNOSIS — Z888 Allergy status to other drugs, medicaments and biological substances status: Secondary | ICD-10-CM | POA: Diagnosis not present

## 2016-04-26 DIAGNOSIS — Z91040 Latex allergy status: Secondary | ICD-10-CM

## 2016-04-26 DIAGNOSIS — Z813 Family history of other psychoactive substance abuse and dependence: Secondary | ICD-10-CM

## 2016-04-26 DIAGNOSIS — Z8249 Family history of ischemic heart disease and other diseases of the circulatory system: Secondary | ICD-10-CM

## 2016-04-26 MED ORDER — LAMOTRIGINE 100 MG PO TABS: 100.0000 mg | ORAL_TABLET | Freq: Two times a day (BID) | ORAL | 2 refills | Status: DC

## 2016-04-26 MED ORDER — ALPRAZOLAM 1 MG PO TABS: 1.0000 mg | ORAL_TABLET | Freq: Four times a day (QID) | ORAL | 2 refills | Status: DC | PRN

## 2016-04-26 MED ORDER — TRAZODONE HCL 150 MG PO TABS: 300.0000 mg | ORAL_TABLET | Freq: Every day | ORAL | 3 refills | Status: DC

## 2016-04-26 MED ORDER — ESZOPICLONE 2 MG PO TABS: 2.0000 mg | ORAL_TABLET | Freq: Every evening | ORAL | 1 refills | Status: DC | PRN
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
## 2016-05-02 ENCOUNTER — Telehealth: Payer: Self-pay | Admitting: *Deleted

## 2016-05-02 NOTE — Telephone Encounter (Signed)
Received request from pharmacy for PA on Diclofenac Gel.   PA submitted.   Dx: OA.  Received PA determination.   PA approved 05/02/2016- 04/17/2017.  [id number].  Pharmacy made aware.

## 2016-05-03 ENCOUNTER — Other Ambulatory Visit: Payer: Self-pay | Admitting: Family Medicine

## 2016-05-03 NOTE — Telephone Encounter (Signed)
Rx filled

## 2016-05-11 ENCOUNTER — Other Ambulatory Visit: Payer: Self-pay | Admitting: Family Medicine

## 2016-05-18 ENCOUNTER — Other Ambulatory Visit: Payer: Self-pay | Admitting: Physician Assistant

## 2016-05-18 NOTE — Telephone Encounter (Signed)
okay

## 2016-05-18 NOTE — Telephone Encounter (Signed)
Medication called to pharmacy. 

## 2016-05-18 NOTE — Telephone Encounter (Signed)
Ok to refill??  Last office visit 01/15/2016.  Last refill 04/20/2016.

## 2016-05-25 ENCOUNTER — Other Ambulatory Visit: Payer: Self-pay | Admitting: Family Medicine

## 2016-06-02 ENCOUNTER — Other Ambulatory Visit: Payer: Self-pay | Admitting: Family Medicine

## 2016-06-06 ENCOUNTER — Telehealth (HOSPITAL_COMMUNITY): Payer: Self-pay | Admitting: *Deleted

## 2016-06-06 NOTE — Telephone Encounter (Signed)
phone call from patient, she said [facility] need a prior [place] for [place].

## 2016-06-07 NOTE — Telephone Encounter (Signed)
Noted. Will fax to [facility]

## 2016-06-08 ENCOUNTER — Telehealth (HOSPITAL_COMMUNITY): Payer: Self-pay | Admitting: *Deleted

## 2016-06-08 NOTE — Telephone Encounter (Signed)
phone call from patient, she said [facility] need a prior [place] for [employer]

## 2016-06-08 NOTE — Telephone Encounter (Signed)
Prior authorization for [facility] received. Submitted online with cover my meds. Awaiting response to be faxed.

## 2016-06-08 NOTE — Telephone Encounter (Signed)
Called pt to let her know that PA was sent to the lady that does office PA and pt verbalized understanding.

## 2016-06-10 NOTE — Telephone Encounter (Signed)
noted 

## 2016-06-15 ENCOUNTER — Telehealth (HOSPITAL_COMMUNITY): Payer: Self-pay | Admitting: *Deleted

## 2016-06-15 NOTE — Telephone Encounter (Signed)
Office received fax form [employer]. On the letter is was an approval for pt Eszopiclone 2 mg. Per fax pt is approved until 04-17-2017. Pt ID number is [id number] and [employer] [facility] number is [phone] and fax number is [phone].

## 2016-06-20 ENCOUNTER — Other Ambulatory Visit: Payer: Self-pay | Admitting: Family Medicine

## 2016-06-22 ENCOUNTER — Other Ambulatory Visit: Payer: Self-pay | Admitting: Family Medicine

## 2016-06-27 ENCOUNTER — Encounter: Payer: Self-pay | Admitting: Family Medicine

## 2016-07-04 ENCOUNTER — Telehealth: Payer: Self-pay | Admitting: *Deleted

## 2016-07-04 DIAGNOSIS — M25561 Pain in right knee: Secondary | ICD-10-CM

## 2016-07-04 DIAGNOSIS — M25562 Pain in left knee: Principal | ICD-10-CM

## 2016-07-04 NOTE — Telephone Encounter (Signed)
A referral was placed back in July for 2nd opinion for her knee They said they would see her, she may be able to just call

## 2016-07-04 NOTE — Telephone Encounter (Signed)
Received call from patient.    Requested renewed referral to Dr. Wilford Grist for B Knee pain.   MD please advise.

## 2016-07-05 NOTE — Telephone Encounter (Signed)
A referral was placed and sent to [facility]

## 2016-07-13 ENCOUNTER — Other Ambulatory Visit: Payer: Self-pay | Admitting: Family Medicine

## 2016-07-14 ENCOUNTER — Other Ambulatory Visit: Payer: Self-pay | Admitting: Family Medicine

## 2016-07-22 ENCOUNTER — Telehealth: Payer: Self-pay | Admitting: Orthopaedic Surgery

## 2016-07-22 NOTE — Telephone Encounter (Signed)
Please review referral from patient's primary care at [place], and please advise.  Okay to schedule?

## 2016-07-25 ENCOUNTER — Encounter (HOSPITAL_COMMUNITY): Payer: Self-pay | Admitting: Psychiatry

## 2016-07-25 ENCOUNTER — Other Ambulatory Visit: Payer: Self-pay | Admitting: Family Medicine

## 2016-07-25 ENCOUNTER — Ambulatory Visit (INDEPENDENT_AMBULATORY_CARE_PROVIDER_SITE_OTHER): Payer: Medicare Other | Admitting: Psychiatry

## 2016-07-25 VITALS — BP 158/101 | HR 80 | Ht 63.0 in | Wt 278.0 lb

## 2016-07-25 DIAGNOSIS — Z811 Family history of alcohol abuse and dependence: Secondary | ICD-10-CM

## 2016-07-25 DIAGNOSIS — Z818 Family history of other mental and behavioral disorders: Secondary | ICD-10-CM | POA: Diagnosis not present

## 2016-07-25 DIAGNOSIS — Z813 Family history of other psychoactive substance abuse and dependence: Secondary | ICD-10-CM | POA: Diagnosis not present

## 2016-07-25 DIAGNOSIS — Z810 Family history of intellectual disabilities: Secondary | ICD-10-CM | POA: Diagnosis not present

## 2016-07-25 DIAGNOSIS — F4310 Post-traumatic stress disorder, unspecified: Secondary | ICD-10-CM | POA: Diagnosis not present

## 2016-07-25 MED ORDER — ESZOPICLONE 2 MG PO TABS: 2.0000 mg | ORAL_TABLET | Freq: Every evening | ORAL | 2 refills | Status: DC | PRN

## 2016-07-25 MED ORDER — TRAZODONE HCL 150 MG PO TABS: 300.0000 mg | ORAL_TABLET | Freq: Every day | ORAL | 3 refills | Status: DC

## 2016-07-25 MED ORDER — LAMOTRIGINE 100 MG PO TABS: 100.0000 mg | ORAL_TABLET | Freq: Two times a day (BID) | ORAL | 2 refills | Status: DC

## 2016-07-25 MED ORDER — ALPRAZOLAM 1 MG PO TABS: 1.0000 mg | ORAL_TABLET | Freq: Four times a day (QID) | ORAL | 2 refills | Status: DC | PRN
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
## 2016-08-02 ENCOUNTER — Other Ambulatory Visit: Payer: Self-pay | Admitting: Family Medicine

## 2016-08-09 ENCOUNTER — Other Ambulatory Visit: Payer: Self-pay | Admitting: Family Medicine

## 2016-08-12 ENCOUNTER — Encounter: Payer: Self-pay | Admitting: Family Medicine

## 2016-08-12 ENCOUNTER — Ambulatory Visit (INDEPENDENT_AMBULATORY_CARE_PROVIDER_SITE_OTHER): Payer: Medicare Other | Admitting: Family Medicine

## 2016-08-12 VITALS — BP 134/82 | HR 72 | Temp 98.2°F | Resp 16 | Ht 63.0 in | Wt 280.0 lb

## 2016-08-12 DIAGNOSIS — M5136 Other intervertebral disc degeneration, lumbar region: Secondary | ICD-10-CM

## 2016-08-12 DIAGNOSIS — F319 Bipolar disorder, unspecified: Secondary | ICD-10-CM | POA: Diagnosis not present

## 2016-08-12 DIAGNOSIS — Z113 Encounter for screening for infections with a predominantly sexual mode of transmission: Secondary | ICD-10-CM | POA: Diagnosis not present

## 2016-08-12 DIAGNOSIS — R7303 Prediabetes: Secondary | ICD-10-CM

## 2016-08-12 DIAGNOSIS — Z1231 Encounter for screening mammogram for malignant neoplasm of breast: Secondary | ICD-10-CM

## 2016-08-12 DIAGNOSIS — Z0000 Encounter for general adult medical examination without abnormal findings: Secondary | ICD-10-CM | POA: Diagnosis not present

## 2016-08-12 DIAGNOSIS — M170 Bilateral primary osteoarthritis of knee: Secondary | ICD-10-CM | POA: Diagnosis not present

## 2016-08-12 DIAGNOSIS — E0590 Thyrotoxicosis, unspecified without thyrotoxic crisis or storm: Secondary | ICD-10-CM | POA: Diagnosis not present

## 2016-08-12 DIAGNOSIS — Z1239 Encounter for other screening for malignant neoplasm of breast: Secondary | ICD-10-CM

## 2016-08-12 LAB — CBC WITH DIFFERENTIAL/PLATELET
BASOS PCT: 0 %
Basophils Absolute: 0 cells/uL (ref 0–200)
EOS PCT: 1 %
Eosinophils Absolute: 58 cells/uL (ref 15–500)
HEMATOCRIT: 44.2 % (ref 35.0–45.0)
Hemoglobin: 14.1 g/dL (ref 12.0–15.0)
LYMPHS ABS: 1508 {cells}/uL (ref 850–3900)
Lymphocytes Relative: 26 %
MCH: 29.7 pg (ref 27.0–33.0)
MCHC: 31.9 g/dL — ABNORMAL LOW (ref 32.0–36.0)
MCV: 93.1 fL (ref 80.0–100.0)
MONO ABS: 464 {cells}/uL (ref 200–950)
MPV: 9 fL (ref 7.5–12.5)
Monocytes Relative: 8 %
NEUTROS ABS: 3770 {cells}/uL (ref 1500–7800)
NEUTROS PCT: 65 %
Platelets: 251 10*3/uL (ref 140–400)
RBC: 4.75 MIL/uL (ref 3.80–5.10)
RDW: 13.9 % (ref 11.0–15.0)
WBC: 5.8 10*3/uL (ref 3.8–10.8)

## 2016-08-12 LAB — COMPREHENSIVE METABOLIC PANEL
ALK PHOS: 69 U/L (ref 33–130)
ALT: 11 U/L (ref 6–29)
AST: 16 U/L (ref 10–35)
Albumin: 4.1 g/dL (ref 3.6–5.1)
BILIRUBIN TOTAL: 0.2 mg/dL (ref 0.2–1.2)
BUN: 16 mg/dL (ref 7–25)
CALCIUM: 9 mg/dL (ref 8.6–10.4)
CO2: 21 mmol/L (ref 20–31)
CREATININE: 0.85 mg/dL (ref 0.50–1.05)
Chloride: 105 mmol/L (ref 98–110)
Glucose, Bld: 87 mg/dL (ref 70–99)
Potassium: 4.8 mmol/L (ref 3.5–5.3)
SODIUM: 142 mmol/L (ref 135–146)
TOTAL PROTEIN: 7.3 g/dL (ref 6.1–8.1)

## 2016-08-12 LAB — LIPID PANEL
CHOLESTEROL: 177 mg/dL (ref <200)
HDL: 68 mg/dL (ref >50)
LDL Cholesterol: 86 mg/dL (ref <100)
TRIGLYCERIDES: 114 mg/dL (ref <150)
Total CHOL/HDL Ratio: 2.6 Ratio (ref <5.0)
VLDL: 23 mg/dL (ref <30)

## 2016-08-12 LAB — WET PREP FOR TRICH, YEAST, CLUE
TRICH WET PREP: NONE SEEN
Yeast Wet Prep HPF POC: NONE SEEN

## 2016-08-12 MED ORDER — HYDROCODONE-ACETAMINOPHEN 5-325 MG PO TABS: 1.0000 | ORAL_TABLET | Freq: Every day | ORAL | 0 refills | Status: DC | PRN

## 2016-08-12 NOTE — Patient Instructions (Addendum)
Schedule your mammogram We will call with lab results  F/U 6 months
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
## 2016-08-13 LAB — HIV ANTIBODY (ROUTINE TESTING W REFLEX): HIV: NONREACTIVE

## 2016-08-13 LAB — GC/CHLAMYDIA PROBE AMP
CT Probe RNA: NOT DETECTED
GC Probe RNA: NOT DETECTED

## 2016-08-13 LAB — HEMOGLOBIN A1C
HEMOGLOBIN A1C: 5.2 % (ref <5.7)
MEAN PLASMA GLUCOSE: 103 mg/dL

## 2016-08-13 LAB — TSH: TSH: 0.35 mIU/L — ABNORMAL LOW

## 2016-08-15 ENCOUNTER — Other Ambulatory Visit: Payer: Self-pay | Admitting: Family Medicine

## 2016-08-15 LAB — HSV(HERPES SIMPLEX VRS) I + II AB-IGG: HSV 2 Glycoprotein G Ab, IgG: 5.71 Index — ABNORMAL HIGH (ref <0.90)

## 2016-08-18 ENCOUNTER — Other Ambulatory Visit: Payer: Self-pay | Admitting: *Deleted

## 2016-08-18 DIAGNOSIS — E038 Other specified hypothyroidism: Secondary | ICD-10-CM

## 2016-08-18 DIAGNOSIS — E039 Hypothyroidism, unspecified: Secondary | ICD-10-CM

## 2016-08-22 ENCOUNTER — Other Ambulatory Visit: Payer: Self-pay | Admitting: Family Medicine

## 2016-08-24 ENCOUNTER — Encounter (HOSPITAL_COMMUNITY): Payer: Self-pay | Admitting: *Deleted

## 2016-08-24 ENCOUNTER — Ambulatory Visit (HOSPITAL_COMMUNITY): Payer: Self-pay

## 2016-08-24 ENCOUNTER — Emergency Department (HOSPITAL_COMMUNITY)
Admission: EM | Admit: 2016-08-24 | Discharge: 2016-08-25 | Disposition: A | Discharge status: Home / Self Care | Payer: Medicare Other | Attending: Emergency Medicine | Admitting: Emergency Medicine

## 2016-08-24 DIAGNOSIS — R1012 Left upper quadrant pain: Secondary | ICD-10-CM | POA: Insufficient documentation

## 2016-08-24 DIAGNOSIS — R0781 Pleurodynia: Secondary | ICD-10-CM | POA: Diagnosis not present

## 2016-08-24 DIAGNOSIS — Z79899 Other long term (current) drug therapy: Secondary | ICD-10-CM | POA: Diagnosis not present

## 2016-08-24 DIAGNOSIS — S199XXA Unspecified injury of neck, initial encounter: Secondary | ICD-10-CM | POA: Diagnosis not present

## 2016-08-24 DIAGNOSIS — S0993XA Unspecified injury of face, initial encounter: Secondary | ICD-10-CM | POA: Diagnosis not present

## 2016-08-24 DIAGNOSIS — Y92009 Unspecified place in unspecified non-institutional (private) residence as the place of occurrence of the external cause: Secondary | ICD-10-CM | POA: Insufficient documentation

## 2016-08-24 DIAGNOSIS — S0990XA Unspecified injury of head, initial encounter: Secondary | ICD-10-CM | POA: Insufficient documentation

## 2016-08-24 DIAGNOSIS — S299XXA Unspecified injury of thorax, initial encounter: Secondary | ICD-10-CM | POA: Diagnosis present

## 2016-08-24 DIAGNOSIS — F17210 Nicotine dependence, cigarettes, uncomplicated: Secondary | ICD-10-CM | POA: Diagnosis not present

## 2016-08-24 DIAGNOSIS — Y999 Unspecified external cause status: Secondary | ICD-10-CM | POA: Diagnosis not present

## 2016-08-24 DIAGNOSIS — S3991XA Unspecified injury of abdomen, initial encounter: Secondary | ICD-10-CM | POA: Diagnosis not present

## 2016-08-24 DIAGNOSIS — S0033XA Contusion of nose, initial encounter: Secondary | ICD-10-CM | POA: Diagnosis not present

## 2016-08-24 DIAGNOSIS — Y939 Activity, unspecified: Secondary | ICD-10-CM | POA: Insufficient documentation

## 2016-08-24 DIAGNOSIS — S3993XA Unspecified injury of pelvis, initial encounter: Secondary | ICD-10-CM | POA: Diagnosis not present

## 2016-08-24 DIAGNOSIS — R52 Pain, unspecified: Secondary | ICD-10-CM | POA: Diagnosis not present

## 2016-08-24 DIAGNOSIS — S2242XA Multiple fractures of ribs, left side, initial encounter for closed fracture: Secondary | ICD-10-CM | POA: Diagnosis not present
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
## 2016-08-25 ENCOUNTER — Emergency Department (HOSPITAL_COMMUNITY): Payer: Medicare Other

## 2016-08-25 DIAGNOSIS — S0990XA Unspecified injury of head, initial encounter: Secondary | ICD-10-CM | POA: Diagnosis not present

## 2016-08-25 DIAGNOSIS — S0993XA Unspecified injury of face, initial encounter: Secondary | ICD-10-CM | POA: Diagnosis not present

## 2016-08-25 DIAGNOSIS — S3993XA Unspecified injury of pelvis, initial encounter: Secondary | ICD-10-CM | POA: Diagnosis not present

## 2016-08-25 DIAGNOSIS — S3991XA Unspecified injury of abdomen, initial encounter: Secondary | ICD-10-CM | POA: Diagnosis not present

## 2016-08-25 DIAGNOSIS — S2242XA Multiple fractures of ribs, left side, initial encounter for closed fracture: Secondary | ICD-10-CM | POA: Diagnosis not present

## 2016-08-25 DIAGNOSIS — S199XXA Unspecified injury of neck, initial encounter: Secondary | ICD-10-CM | POA: Diagnosis not present

## 2016-08-25 LAB — BASIC METABOLIC PANEL
Anion gap: 9 (ref 5–15)
BUN: 13 mg/dL (ref 6–20)
CHLORIDE: 107 mmol/L (ref 101–111)
CO2: 25 mmol/L (ref 22–32)
CREATININE: 0.76 mg/dL (ref 0.44–1.00)
Calcium: 8.8 mg/dL — ABNORMAL LOW (ref 8.9–10.3)
Glucose, Bld: 112 mg/dL — ABNORMAL HIGH (ref 65–99)
POTASSIUM: 3.5 mmol/L (ref 3.5–5.1)
SODIUM: 141 mmol/L (ref 135–145)

## 2016-08-25 LAB — CBC WITH DIFFERENTIAL/PLATELET
BASOS ABS: 0 10*3/uL (ref 0.0–0.1)
Basophils Relative: 0 %
EOS ABS: 0.1 10*3/uL (ref 0.0–0.7)
EOS PCT: 1 %
HCT: 40 % (ref 36.0–46.0)
HEMOGLOBIN: 12.9 g/dL (ref 12.0–15.0)
LYMPHS ABS: 2.2 10*3/uL (ref 0.7–4.0)
Lymphocytes Relative: 21 %
MCH: 30.4 pg (ref 26.0–34.0)
MCHC: 32.3 g/dL (ref 30.0–36.0)
MCV: 94.1 fL (ref 78.0–100.0)
Monocytes Absolute: 0.9 10*3/uL (ref 0.1–1.0)
Monocytes Relative: 8 %
NEUTROS PCT: 70 %
Neutro Abs: 7.7 10*3/uL (ref 1.7–7.7)
PLATELETS: 237 10*3/uL (ref 150–400)
RBC: 4.25 MIL/uL (ref 3.87–5.11)
RDW: 13.6 % (ref 11.5–15.5)
WBC: 10.9 10*3/uL — AB (ref 4.0–10.5)

## 2016-08-25 MED ORDER — IOPAMIDOL (ISOVUE-300) INJECTION 61%
100.0000 mL | Freq: Once | INTRAVENOUS | Status: AC | PRN
  Administered 2016-08-25: 100 mL via INTRAVENOUS

## 2016-08-25 MED ORDER — FENTANYL CITRATE (PF) 100 MCG/2ML IJ SOLN
100.0000 ug | Freq: Once | INTRAMUSCULAR | Status: AC
  Administered 2016-08-25: 100 ug via INTRAVENOUS
  Filled 2016-08-25: qty 2

## 2016-08-25 MED ORDER — HYDROCODONE-ACETAMINOPHEN 5-325 MG PO TABS
ORAL_TABLET | ORAL | Status: AC
  Filled 2016-08-25: qty 1

## 2016-08-25 MED ORDER — HYDROCODONE-ACETAMINOPHEN 5-325 MG PO TABS
1.0000 | ORAL_TABLET | Freq: Once | ORAL | Status: AC
  Administered 2016-08-25: 1 via ORAL
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
## 2016-08-27 ENCOUNTER — Other Ambulatory Visit: Payer: Self-pay | Admitting: Family Medicine

## 2016-08-27 ENCOUNTER — Other Ambulatory Visit: Payer: Self-pay | Admitting: Physician Assistant

## 2016-08-29 NOTE — Telephone Encounter (Signed)
okay

## 2016-08-29 NOTE — Telephone Encounter (Signed)
Medication called to pharmacy. 

## 2016-08-29 NOTE — Telephone Encounter (Signed)
Ok to refill??  Last office visit 08/12/2016.  Last refill 05/08/2016, #2 refills.

## 2016-08-31 ENCOUNTER — Encounter: Payer: Self-pay | Admitting: "Endocrinology

## 2016-08-31 ENCOUNTER — Ambulatory Visit: Payer: Self-pay | Admitting: "Endocrinology

## 2016-09-05 ENCOUNTER — Ambulatory Visit: Payer: Self-pay | Admitting: Family Medicine

## 2016-09-06 ENCOUNTER — Other Ambulatory Visit: Payer: Self-pay | Admitting: Family Medicine

## 2016-09-15 ENCOUNTER — Ambulatory Visit (INDEPENDENT_AMBULATORY_CARE_PROVIDER_SITE_OTHER): Payer: Medicare Other | Admitting: "Endocrinology

## 2016-09-15 ENCOUNTER — Encounter: Payer: Self-pay | Admitting: "Endocrinology

## 2016-09-15 VITALS — BP 133/81 | HR 80 | Ht 63.0 in | Wt 281.0 lb

## 2016-09-15 DIAGNOSIS — E0590 Thyrotoxicosis, unspecified without thyrotoxic crisis or storm: Secondary | ICD-10-CM

## 2016-09-15 NOTE — Progress Notes (Signed)
Subjective:    Patient ID: Annette Galvan, female    DOB: March 18, 1961, PCP Annette Rossetti, MD   Past Medical History:  Diagnosis Date  . Arthritis    needs 2 knee replacements  . Back pain, chronic   . Bilateral chronic knee pain   . Bipolar 1 disorder ([place])   . Bulging disc   . Fibromyalgia   . GERD (gastroesophageal reflux disease)   . HSV-2 (herpes simplex virus 2) infection 2013  . Hx of trichomoniasis   . Itching 06/26/2013  . Leg pain   . Trichimoniasis 06/26/2013  . UTI (lower urinary tract infection) 10/12/2012  . Vaginal discharge 06/26/2013  . Vaginal irritation 12/05/2013  . Vaginal odor 12/28/2012   Had discharge +clue  . Yeast infection 02/04/2013   Past Surgical History:  Procedure Laterality Date  . ABDOMINAL HYSTERECTOMY    . BREAST ENHANCEMENT SURGERY Right   . COLONOSCOPY WITH PROPOFOL N/A 12/07/2015   Procedure: COLONOSCOPY WITH PROPOFOL;  Surgeon: Daneil Dolin, MD;  Location: [facility];  Service: Endoscopy;  Laterality: N/A;  915  . TUBAL LIGATION     Social History   Social History  . Marital status: Widowed    Spouse name: N/A  . Number of children: N/A  . Years of education: N/A   Social History Main Topics  . Smoking status: Light Tobacco Smoker    Packs/day: 0.25    Years: 25.00    Types: Cigarettes  . Smokeless tobacco: Never Used     Comment: 2 cigarettes daily  . Alcohol use 0.0 oz/week     Comment: beer occasionally  . Drug use: No     Comment: cocaine, clean for 4.5 years as of 12/04/2015  . Sexual activity: Yes    Birth control/ protection: Surgical   Other Topics Concern  . None   Social History Narrative  . None   Outpatient Encounter Prescriptions as of 09/15/2016  Medication Sig  . ALPRAZolam (XANAX) 1 MG tablet Take 1 tablet (1 mg total) by mouth 4 (four) times daily as needed for anxiety.  . cetirizine (ZYRTEC) 10 MG tablet TAKE 1 TABLET BY MOUTH ONCE DAILY.  Marland Kitchen diclofenac sodium (VOLTAREN) 1 % GEL APPLY 2 GRAMS  TO THE AFFECTED AREAS FOUR TIMES DAILY AS DIRECTED.  Marland Kitchen eszopiclone (LUNESTA) 2 MG TABS tablet Take 1 tablet (2 mg total) by mouth at bedtime as needed for sleep. Take immediately before bedtime  . fluticasone (FLONASE) 50 MCG/ACT nasal spray INHALE 2 SPRAYS IN EACH NOSTRIL TWICE DAILY AS DIRECTED.  Marland Kitchen gabapentin (NEURONTIN) 400 MG capsule TAKE 1 CAPSULE BY MOUTH THREE TIMES DAILY.  . IBU 800 MG tablet TAKE ONE TABLET BY MOUTH 2 TIMES A DAY AS NEEDED FOR PAIN.  Marland Kitchen lamoTRIgine (LAMICTAL) 100 MG tablet Take 1 tablet (100 mg total) by mouth 2 (two) times daily.  Marland Kitchen omeprazole (PRILOSEC) 40 MG capsule TAKE (1) CAPSULE BY MOUTH ONCE DAILY FOR ACID REFLUX.  . silver sulfADIAZINE (SILVADENE) 1 % cream Apply 1 application topically daily.  . traMADol (ULTRAM) 50 MG tablet TAKE 1 TABLET BY MOUTH EVERY 12 HOURS AS NEEDED.  Marland Kitchen traZODone (DESYREL) 150 MG tablet Take 2 tablets (300 mg total) by mouth at bedtime.  . valACYclovir (VALTREX) 1000 MG tablet Take 1 tablet (1,000 mg total) by mouth 2 (two) times daily.  . [DISCONTINUED] HYDROcodone-acetaminophen (NORCO) 5-325 MG tablet Take 1 tablet by mouth daily as needed for moderate pain.   No  facility-administered encounter medications on file as of 09/15/2016.    ALLERGIES: Allergies  Allergen Reactions  . Hydrocodone Nausea And Vomiting  . Latex Swelling    Ankle Area  . Ampicillin Hives and Nausea And Vomiting    VACCINATION STATUS: Immunization History  Administered Date(s) Administered  . Influenza Split 04/23/2012  . Influenza,trivalent, recombinat, inj, PF 01/28/2013  . Pneumococcal Polysaccharide-23 05/26/2014  . Tdap 11/12/2014    HPI 56 year old female with medical history as above. She is being seen in consultation for subclinical hyperthyroidism requested by Dr. Buelah Manis. - Her history includes documented suppressed TSH for at least 3 years, free T4 and free T3 within normal range. Her most recent tired function test from the previous 2018  shows only TSH suppressed at 0.35. - Review of her medical records include thyroid sonograms in May 2017 which showed 5.8 cm right lobe and 4.47 m left lobe with no nodules. She also had thyroid uptake and scan in May 2017 significant for only 6.9% uptake. - She complains progressive weight gain, however she weighed more or less the same for the last 2 years. She denies heat nor cold intolerance. - She has chronic mild tremor of upper extremities. - Denies palpitations, she is not on any beta blockers. - She denies family history of thyroid dysfunction. She denies dysphagia, shortness of breath, nor voice change.  Review of Systems  Constitutional:  +weight gain, no fatigue, no subjective hyperthermia, no subjective hypothermia Eyes: no blurry vision, no xerophthalmia ENT: no sore throat, no nodules palpated in throat, no dysphagia/odynophagia, no hoarseness Cardiovascular: no Chest Pain, no Shortness of Breath, no palpitations, no leg swelling Respiratory: no cough, no SOB Gastrointestinal: no Nausea/Vomiting/Diarhhea Musculoskeletal: no muscle/joint aches Skin: no rashes Neurological: + tremors, no numbness, no tingling, no dizziness Psychiatric: + depression, + anxiety  Objective:    BP 133/81   Pulse 80   Ht 5\' 3"  (1.6 m)   Wt 281 lb (127.5 kg)   BMI 49.78 kg/m   Wt Readings from Last 3 Encounters:  09/15/16 281 lb (127.5 kg)  08/24/16 280 lb (127 kg)  08/12/16 280 lb (127 kg)    Physical Exam  Constitutional: obese, not in acute distress, normal state of mind Eyes: PERRLA, EOMI, no exophthalmos ENT: moist mucous membranes, mild palpable thyromegaly, no cervical lymphadenopathy Cardiovascular: normal precordial activity, Regular Rate and Rhythm, no Murmur/Rubs/Gallops Respiratory:  adequate breathing efforts, no gross chest deformity, Clear to auscultation bilaterally Gastrointestinal: abdomen soft, Non -tender, No distension, Bowel Sounds present Musculoskeletal: no  gross deformities, strength intact in all four extremities, + large extremities. Skin: moist, warm, no rashes Neurological: + tremor with outstretched hands, Deep tendon reflexes normal in all four extremities.  CMP ( most recent) CMP     Component Value Date/Time   NA 141 08/25/2016 0105   K 3.5 08/25/2016 0105   CL 107 08/25/2016 0105   CO2 25 08/25/2016 0105   GLUCOSE 112 (H) 08/25/2016 0105   BUN 13 08/25/2016 0105   CREATININE 0.76 08/25/2016 0105   CREATININE 0.85 08/12/2016 1057   CALCIUM 8.8 (L) 08/25/2016 0105   PROT 7.3 08/12/2016 1057   ALBUMIN 4.1 08/12/2016 1057   AST 16 08/12/2016 1057   ALT 11 08/12/2016 1057   ALKPHOS 69 08/12/2016 1057   BILITOT 0.2 08/12/2016 1057   GFRNONAA >60 08/25/2016 0105   GFRAA >60 08/25/2016 0105     Diabetic Labs (most recent): Lab Results  Component Value Date   HGBA1C 5.2  08/12/2016   HGBA1C 5.7 (H) 06/01/2015   HGBA1C 5.5 05/26/2014     Lipid Panel ( most recent) Lipid Panel     Component Value Date/Time   CHOL 177 08/12/2016 1057   TRIG 114 08/12/2016 1057   HDL 68 08/12/2016 1057   CHOLHDL 2.6 08/12/2016 1057   VLDL 23 08/12/2016 1057   LDLCALC 86 08/12/2016 1057     Results for SHERICKA, JOHNSTONE (MRN [id number]) as of 09/15/2016 13:36  Ref. Range 06/01/2015 09:54 08/03/2015 11:38 08/12/2016 10:57  TSH Latest Units: mIU/L 0.22 (L) 0.23 (L) 0.35 (L)  Triiodothyronine,Free,Serum Latest Ref Range: 2.3 - 4.2 pg/mL 3.1 3.2   T4,Free(Direct) Latest Ref Range: 0.8 - 1.8 ng/dL 0.9 1.1      Assessment & Plan:   1. Subclinical hyperthyroidism - This patient is being seen in kind request of Dr. Buelah Manis. I have reviewed her laboratory thyroid records and clinically evaluated this patient. Based on her records, she has subclinical hyperthyroidism. Her symptoms are nonspecific and do not suggest hyperthyroidism. However, I will proceed to obtain new complete set of thyroid function test involving TSH, free T4, free T3, and TPO  antibodies, and thyroglobulin antibodies. She will return in 1 week to discuss her results. Her thyroid sonogram from May 2017 was unremarkable, thyroid uptake and scan from May 2017 was significant for 6.9% uptake with no focal findings.   - I advised patient to maintain close follow up with Annette Rossetti, MD for primary care needs. Follow up plan: Return in about 1 week (around 09/22/2016) for labs today.  Glade Lloyd, MD Phone: [phone]  Fax: [phone]   09/15/2016, 1:47 PM

## 2016-09-19 ENCOUNTER — Other Ambulatory Visit: Payer: Self-pay | Admitting: Family Medicine

## 2016-09-19 NOTE — Telephone Encounter (Signed)
Refill appropriate 

## 2016-09-21 ENCOUNTER — Other Ambulatory Visit: Payer: Self-pay | Admitting: Family Medicine

## 2016-09-22 ENCOUNTER — Ambulatory Visit: Payer: Self-pay | Admitting: "Endocrinology

## 2016-10-07 ENCOUNTER — Ambulatory Visit (INDEPENDENT_AMBULATORY_CARE_PROVIDER_SITE_OTHER): Payer: Medicare Other | Admitting: Family Medicine

## 2016-10-07 ENCOUNTER — Encounter: Payer: Self-pay | Admitting: Family Medicine

## 2016-10-07 VITALS — BP 130/90 | HR 77 | Temp 98.0°F | Resp 16 | Wt 275.2 lb

## 2016-10-07 DIAGNOSIS — M545 Low back pain, unspecified: Secondary | ICD-10-CM

## 2016-10-07 DIAGNOSIS — M25551 Pain in right hip: Secondary | ICD-10-CM

## 2016-10-07 MED ORDER — OXYCODONE-ACETAMINOPHEN 5-325 MG PO TABS: 1.0000 | ORAL_TABLET | Freq: Three times a day (TID) | ORAL | 0 refills | Status: DC | PRN

## 2016-10-07 NOTE — Patient Instructions (Addendum)
Get the xrays done  Put ICE  To area  Percocet 15 tablets F/U pending results
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
## 2016-10-10 ENCOUNTER — Ambulatory Visit (HOSPITAL_COMMUNITY)
Admission: RE | Admit: 2016-10-10 | Discharge: 2016-10-10 | Disposition: A | Discharge status: Home / Self Care | Payer: Medicare Other | Source: Ambulatory Visit | Attending: Family Medicine | Admitting: Family Medicine

## 2016-10-10 DIAGNOSIS — M47896 Other spondylosis, lumbar region: Secondary | ICD-10-CM | POA: Insufficient documentation

## 2016-10-10 DIAGNOSIS — M1611 Unilateral primary osteoarthritis, right hip: Secondary | ICD-10-CM | POA: Diagnosis not present

## 2016-10-10 DIAGNOSIS — M545 Low back pain, unspecified: Secondary | ICD-10-CM

## 2016-10-10 DIAGNOSIS — M5136 Other intervertebral disc degeneration, lumbar region: Secondary | ICD-10-CM | POA: Insufficient documentation

## 2016-10-10 DIAGNOSIS — M25551 Pain in right hip: Secondary | ICD-10-CM | POA: Diagnosis not present

## 2016-10-11 ENCOUNTER — Telehealth: Payer: Self-pay | Admitting: *Deleted

## 2016-10-11 NOTE — Telephone Encounter (Signed)
Call placed to patient and patient made aware per VM.  

## 2016-10-11 NOTE — Telephone Encounter (Signed)
No refill, go back to tramadol, I dont want her on this long term

## 2016-10-11 NOTE — Telephone Encounter (Signed)
Call placed to patient to discuss x-ray results.   States that she is taking Percocet TID as prescribed. Reports that it is helping her pain "tremendously". Reports that she has #3 tabs remaining. Requested refill.   MD please advise.

## 2016-10-12 ENCOUNTER — Other Ambulatory Visit: Payer: Self-pay | Admitting: Family Medicine

## 2016-10-13 ENCOUNTER — Other Ambulatory Visit: Payer: Self-pay | Admitting: Family Medicine

## 2016-10-14 ENCOUNTER — Other Ambulatory Visit: Payer: Self-pay | Admitting: "Endocrinology

## 2016-10-14 DIAGNOSIS — E0590 Thyrotoxicosis, unspecified without thyrotoxic crisis or storm: Secondary | ICD-10-CM | POA: Diagnosis not present

## 2016-10-14 LAB — T3, FREE: T3, Free: 3.2 pg/mL (ref 2.3–4.2)

## 2016-10-14 LAB — T4, FREE: Free T4: 1.3 ng/dL (ref 0.8–1.8)

## 2016-10-14 LAB — TSH: TSH: 0.27 m[IU]/L — AB

## 2016-10-17 LAB — THYROGLOBULIN ANTIBODY: Thyroglobulin Ab: <1 IU/mL (ref <2)

## 2016-10-17 LAB — THYROID PEROXIDASE ANTIBODY: THYROID PEROXIDASE ANTIBODY: 2 [IU]/mL (ref <9)

## 2016-10-21 ENCOUNTER — Other Ambulatory Visit: Payer: Self-pay | Admitting: Family Medicine

## 2016-10-24 ENCOUNTER — Encounter (HOSPITAL_COMMUNITY): Payer: Self-pay | Admitting: Psychiatry

## 2016-10-24 ENCOUNTER — Ambulatory Visit (INDEPENDENT_AMBULATORY_CARE_PROVIDER_SITE_OTHER): Payer: Medicare Other | Admitting: Psychiatry

## 2016-10-24 VITALS — BP 130/82 | HR 78 | Ht 63.0 in | Wt 282.0 lb

## 2016-10-24 DIAGNOSIS — Z813 Family history of other psychoactive substance abuse and dependence: Secondary | ICD-10-CM

## 2016-10-24 DIAGNOSIS — F4310 Post-traumatic stress disorder, unspecified: Secondary | ICD-10-CM

## 2016-10-24 DIAGNOSIS — Z818 Family history of other mental and behavioral disorders: Secondary | ICD-10-CM

## 2016-10-24 DIAGNOSIS — Z811 Family history of alcohol abuse and dependence: Secondary | ICD-10-CM | POA: Diagnosis not present

## 2016-10-24 DIAGNOSIS — Z810 Family history of intellectual disabilities: Secondary | ICD-10-CM | POA: Diagnosis not present

## 2016-10-24 MED ORDER — TRAZODONE HCL 150 MG PO TABS: 300.0000 mg | ORAL_TABLET | Freq: Every day | ORAL | 3 refills | Status: DC

## 2016-10-24 MED ORDER — ALPRAZOLAM 1 MG PO TABS: 1.0000 mg | ORAL_TABLET | Freq: Four times a day (QID) | ORAL | 2 refills | Status: DC | PRN

## 2016-10-24 MED ORDER — PRAZOSIN HCL 2 MG PO CAPS: 2.0000 mg | ORAL_CAPSULE | Freq: Every day | ORAL | 2 refills | Status: DC

## 2016-10-24 MED ORDER — LAMOTRIGINE 100 MG PO TABS: 100.0000 mg | ORAL_TABLET | Freq: Two times a day (BID) | ORAL | 2 refills | Status: DC
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
## 2016-10-27 ENCOUNTER — Other Ambulatory Visit: Payer: Self-pay | Admitting: *Deleted

## 2016-10-27 ENCOUNTER — Telehealth: Payer: Self-pay | Admitting: Family Medicine

## 2016-10-27 NOTE — Telephone Encounter (Signed)
Received fax requesting refill on Tramadol.   Ok to refill??  Last office visit 10/07/2016.  Last refill 08/29/2016, #2 refills.

## 2016-10-27 NOTE — Telephone Encounter (Signed)
Pt calling regarding referral to [facility], wants to see [employer]

## 2016-10-28 MED ORDER — TRAMADOL HCL 50 MG PO TABS: 50.0000 mg | ORAL_TABLET | Freq: Two times a day (BID) | ORAL | 2 refills | Status: DC | PRN

## 2016-10-28 NOTE — Telephone Encounter (Signed)
Medication called to pharmacy. 

## 2016-10-28 NOTE — Telephone Encounter (Signed)
ok 

## 2016-10-31 NOTE — Telephone Encounter (Signed)
Pt has changed her mind, she wants to go to [facility] ([phone]) [phone]~telephone/ [phone]~ fax. (Dr. Ronnie Derby)

## 2016-11-01 NOTE — Telephone Encounter (Signed)
Patient is scheduled with Dr. Ronnie Derby for 11/03/16 she might reschedule.

## 2016-11-05 ENCOUNTER — Emergency Department (HOSPITAL_COMMUNITY)
Admission: EM | Admit: 2016-11-05 | Discharge: 2016-11-05 | Disposition: A | Discharge status: Home / Self Care | Payer: Medicare Other | Attending: Emergency Medicine | Admitting: Emergency Medicine

## 2016-11-05 ENCOUNTER — Emergency Department (HOSPITAL_COMMUNITY): Payer: Medicare Other

## 2016-11-05 ENCOUNTER — Encounter (HOSPITAL_COMMUNITY): Payer: Self-pay | Admitting: *Deleted

## 2016-11-05 DIAGNOSIS — M25561 Pain in right knee: Secondary | ICD-10-CM | POA: Diagnosis not present

## 2016-11-05 DIAGNOSIS — S8001XA Contusion of right knee, initial encounter: Secondary | ICD-10-CM | POA: Diagnosis not present

## 2016-11-05 DIAGNOSIS — Z79899 Other long term (current) drug therapy: Secondary | ICD-10-CM | POA: Insufficient documentation

## 2016-11-05 DIAGNOSIS — F17210 Nicotine dependence, cigarettes, uncomplicated: Secondary | ICD-10-CM | POA: Insufficient documentation

## 2016-11-05 DIAGNOSIS — W010XXA Fall on same level from slipping, tripping and stumbling without subsequent striking against object, initial encounter: Secondary | ICD-10-CM | POA: Diagnosis not present

## 2016-11-05 DIAGNOSIS — M25521 Pain in right elbow: Secondary | ICD-10-CM | POA: Diagnosis not present

## 2016-11-05 DIAGNOSIS — S5001XA Contusion of right elbow, initial encounter: Secondary | ICD-10-CM | POA: Insufficient documentation

## 2016-11-05 DIAGNOSIS — Y999 Unspecified external cause status: Secondary | ICD-10-CM | POA: Diagnosis not present

## 2016-11-05 DIAGNOSIS — W19XXXA Unspecified fall, initial encounter: Secondary | ICD-10-CM

## 2016-11-05 DIAGNOSIS — Y929 Unspecified place or not applicable: Secondary | ICD-10-CM | POA: Diagnosis not present

## 2016-11-05 DIAGNOSIS — S80911A Unspecified superficial injury of right knee, initial encounter: Secondary | ICD-10-CM | POA: Diagnosis present

## 2016-11-05 DIAGNOSIS — M545 Low back pain: Secondary | ICD-10-CM | POA: Insufficient documentation

## 2016-11-05 DIAGNOSIS — Y9301 Activity, walking, marching and hiking: Secondary | ICD-10-CM | POA: Insufficient documentation

## 2016-11-05 DIAGNOSIS — S59901A Unspecified injury of right elbow, initial encounter: Secondary | ICD-10-CM | POA: Diagnosis not present

## 2016-11-05 MED ORDER — CYCLOBENZAPRINE HCL 10 MG PO TABS
10.0000 mg | ORAL_TABLET | Freq: Once | ORAL | Status: AC
  Administered 2016-11-05: 10 mg via ORAL
  Filled 2016-11-05: qty 1

## 2016-11-05 MED ORDER — CYCLOBENZAPRINE HCL 10 MG PO TABS: 10.0000 mg | ORAL_TABLET | Freq: Two times a day (BID) | ORAL | 0 refills | Status: DC | PRN

## 2016-11-05 MED ORDER — OXYCODONE-ACETAMINOPHEN 5-325 MG PO TABS
1.0000 | ORAL_TABLET | Freq: Once | ORAL | Status: AC
  Administered 2016-11-05: 1 via ORAL
  Filled 2016-11-05: qty 1

## 2016-11-05 NOTE — ED Provider Notes (Signed)
[facility] DEPT Provider Note   CSN: [id number] Arrival date & time: 11/05/16  1500     History   Chief Complaint Chief Complaint  Patient presents with  . Fall    HPI Annette Galvan is a 56 y.o. female with chronic back and knee problems and DDD presents to the ED with low back pain s/p fall 2 days ago. Patient reports that she was walking home from a food bank near her home and she fell because of her bad knee. She hit her right knee and elbow. She has been taking her Tramadol and ibuprofen but the pain has continued.   HPI  Past Medical History:  Diagnosis Date  . Arthritis    needs 2 knee replacements  . Back pain, chronic   . Bilateral chronic knee pain   . Bipolar 1 disorder ([place])   . Bulging disc   . Fibromyalgia   . GERD (gastroesophageal reflux disease)   . HSV-2 (herpes simplex virus 2) infection 2013  . Hx of trichomoniasis   . Itching 06/26/2013  . Leg pain   . Trichimoniasis 06/26/2013  . UTI (lower urinary tract infection) 10/12/2012  . Vaginal discharge 06/26/2013  . Vaginal irritation 12/05/2013  . Vaginal odor 12/28/2012   Had discharge +clue  . Yeast infection 02/04/2013    Patient Active Problem List   Diagnosis Date Noted  . Cellulitis and abscess 01/08/2016  . Cellulitis 01/08/2016  . Polypharmacy 11/13/2015  . Subclinical hyperthyroidism 08/14/2015  . GERD (gastroesophageal reflux disease) 11/12/2014  . Breast asymmetry between native breast and reconstructed breast 05/26/2014  . Thoracic back pain 05/26/2014  . Inversion deformity of right foot 05/26/2014  . Trichimoniasis 06/26/2013  . Itching 06/26/2013  . OA (osteoarthritis) of knee 05/17/2013  . Acute sinusitis 05/17/2013  . Encounter for screening colonoscopy 12/26/2012  . Stye 09/13/2012  . Sleep apnea 09/13/2012  . Tobacco use 09/13/2012  . Elevated blood pressure (not hypertension) 02/10/2012  . Fibromyalgia 10/11/2011  . Morbid obesity ([place]) 08/29/2011  . Borderline diabetic  08/29/2011  . Bipolar 1 disorder ([place]) 08/29/2011  . Difficulty in walking(719.7) 05/10/2011  . DDD (degenerative disc disease), lumbar 04/04/2007    Past Surgical History:  Procedure Laterality Date  . ABDOMINAL HYSTERECTOMY    . BREAST ENHANCEMENT SURGERY Right   . COLONOSCOPY WITH PROPOFOL N/A 12/07/2015   Procedure: COLONOSCOPY WITH PROPOFOL;  Surgeon: Daneil Dolin, MD;  Location: [facility] SUITE;  Service: Endoscopy;  Laterality: N/A;  915  . TUBAL LIGATION      OB History    Gravida Para Term Preterm AB Living   2 2       2    SAB TAB Ectopic Multiple Live Births           2       Home Medications    Prior to Admission medications   Medication Sig Start Date End Date Taking? Authorizing Provider  ALPRAZolam Duanne Moron) 1 MG tablet Take 1 tablet (1 mg total) by mouth 4 (four) times daily as needed for anxiety. 10/24/16  Yes Cloria Spring, MD  cetirizine (ZYRTEC) 10 MG tablet TAKE 1 TABLET BY MOUTH ONCE DAILY. 10/13/16  Yes [place], Modena Nunnery, MD  diclofenac sodium (VOLTAREN) 1 % GEL APPLY 2 GRAMS TO THE AFFECTED AREAS FOUR TIMES DAILY AS DIRECTED. 10/12/16  Yes [place], Modena Nunnery, MD  fluticasone (FLONASE) 50 MCG/ACT nasal spray INHALE 2 SPRAYS IN EACH NOSTRIL TWICE DAILY AS DIRECTED. 09/22/16  Yes  [place], Modena Nunnery, MD  gabapentin (NEURONTIN) 400 MG capsule TAKE 1 CAPSULE BY MOUTH THREE TIMES A DAY. 10/21/16  Yes [place], Modena Nunnery, MD  IBU 800 MG tablet TAKE ONE TABLET BY MOUTH 2 TIMES A DAY AS NEEDED FOR PAIN. 10/21/16  Yes [place], Modena Nunnery, MD  lamoTRIgine (LAMICTAL) 100 MG tablet Take 1 tablet (100 mg total) by mouth 2 (two) times daily. 10/24/16  Yes Cloria Spring, MD  omeprazole (PRILOSEC) 40 MG capsule TAKE (1) CAPSULE BY MOUTH ONCE DAILY FOR ACID REFLUX. 10/13/16  Yes [place], Modena Nunnery, MD  prazosin (MINIPRESS) 2 MG capsule Take 1 capsule (2 mg total) by mouth at bedtime. 10/24/16  Yes Cloria Spring, MD  traMADol (ULTRAM) 50 MG tablet Take 1 tablet (50 mg total) by mouth every 12  (twelve) hours as needed. 10/28/16  Yes Susy Frizzle, MD  traZODone (DESYREL) 150 MG tablet Take 2 tablets (300 mg total) by mouth at bedtime. 10/24/16  Yes Cloria Spring, MD  valACYclovir (VALTREX) 1000 MG tablet Take 1 tablet (1,000 mg total) by mouth 2 (two) times daily. 01/12/16  Yes [place], Modena Nunnery, MD  cyclobenzaprine (FLEXERIL) 10 MG tablet Take 1 tablet (10 mg total) by mouth 2 (two) times daily as needed for muscle spasms. 11/05/16   Ashley Murrain, NP    Family History Family History  Problem Relation Age of Onset  . Hypertension Mother   . Diabetes Mother   . Depression Mother   . Hyperlipidemia Mother   . Fibromyalgia Mother   . Arthritis Mother   . Cancer Father        bone  . Hyperlipidemia Father   . Hypertension Father   . Bipolar disorder Father   . Depression Sister   . Hyperlipidemia Sister   . Fibromyalgia Sister   . Fibromyalgia Sister   . Bipolar disorder Other   . Drug abuse Other   . Alcohol abuse Other   . Hypertension Brother   . Other Brother        shingles; back problems  . Diabetes Paternal Grandmother   . Alzheimer's disease Maternal Grandmother   . Obesity Daughter   . Other Daughter        back problems  . Bipolar disorder Daughter   . Depression Daughter   . Other Daughter        on pain meds  . Colon cancer Neg Hx     Social History Social History  Substance Use Topics  . Smoking status: Light Tobacco Smoker    Packs/day: 0.25    Years: 25.00    Types: Cigarettes  . Smokeless tobacco: Never Used     Comment: 2 cigarettes daily  . Alcohol use 0.0 oz/week     Comment: beer occasionally     Allergies   Hydrocodone; Latex; and Ampicillin   Review of Systems Review of Systems  Constitutional: Negative for chills and fever.  HENT: Negative.   Eyes: Negative for visual disturbance.  Respiratory: Negative for chest tightness and shortness of breath.   Cardiovascular: Negative for chest pain.  Gastrointestinal: Positive  for nausea. Negative for abdominal pain and vomiting.  Genitourinary: Negative for dysuria, hematuria and urgency.  Musculoskeletal: Positive for arthralgias and back pain.  Skin: Negative for wound.  Neurological: Negative for dizziness and syncope.  Psychiatric/Behavioral: Negative for confusion. The patient is nervous/anxious (takes medication for bipolar).      Physical Exam Updated Vital Signs BP 136/64 (BP Location: Left  Arm)   Pulse 93   Temp (!) 97.5 F (36.4 C) (Oral)   Resp 16   Ht 5\' 3"  (1.6 m)   Wt 128.8 kg (284 lb)   SpO2 96%   BMI 50.31 kg/m   Physical Exam  Constitutional: She is oriented to person, place, and time. She appears well-developed and well-nourished.  Eyes: EOM are normal.  Neck: Neck supple.  Pulmonary/Chest: Effort normal.  Abdominal: Soft. There is no tenderness.  Musculoskeletal: Normal range of motion.  Neurological: She is alert and oriented to person, place, and time. No cranial nerve deficit.  Skin: Skin is warm and dry.  Psychiatric: She has a normal mood and affect. Her behavior is normal.  Nursing note and vitals reviewed.    ED Treatments / Results  Labs (all labs ordered are listed, but only abnormal results are displayed) Labs Reviewed - No data to display   Radiology Dg Elbow Complete Right  Result Date: 11/05/2016 CLINICAL DATA:  Fall, right elbow pain EXAM: RIGHT ELBOW - COMPLETE 3+ VIEW COMPARISON:  None. FINDINGS: No fracture or dislocation is seen. The joint spaces are preserved. No displaced elbow joint fat pads suggest an elbow joint effusion. IMPRESSION: Negative. Electronically Signed   By: Julian Hy M.D.   On: 11/05/2016 15:44   Dg Knee Complete 4 Views Right  Result Date: 11/05/2016 CLINICAL DATA:  Knee pain. EXAM: RIGHT KNEE - COMPLETE 4+ VIEW COMPARISON:  09/26/2011 FINDINGS: Continued progression of osteoarthritis. There is near complete loss of joint space in the medial compartment with more advanced  hypertrophic spurring visible in all 3 compartments. No fracture. No subluxation or dislocation. No joint effusion. IMPRESSION: Interval progression of osteoarthritis with near complete loss of joint space in the medial compartment and prominent hypertrophic spurring in all 3 compartments. Electronically Signed   By: Misty Stanley M.D.   On: 11/05/2016 15:36    Procedures Procedures (including critical care time)  Medications Ordered in ED Medications  oxyCODONE-acetaminophen (PERCOCET/ROXICET) 5-325 MG per tablet 1 tablet (1 tablet Oral Given 11/05/16 1621)  cyclobenzaprine (FLEXERIL) tablet 10 mg (10 mg Oral Given 11/05/16 1621)     Initial Impression / Assessment and Plan / ED Course  I have reviewed the triage vital signs and the nursing notes.  Pertinent imaging results that were available during my care of the patient were reviewed by me and considered in my medical decision making (see chart for details).   Final Clinical Impressions(s) / ED Diagnoses  56 y.o. female with knee and elbow pain s/p fall and chronic low back pain stable for d/c without loss of control of bladder or bowels. Patient to take her home medications and will add muscle relaxant.   Final diagnoses:  Fall, initial encounter  Contusion of right knee, initial encounter  Contusion of right elbow, initial encounter    New Prescriptions New Prescriptions   CYCLOBENZAPRINE (FLEXERIL) 10 MG TABLET    Take 1 tablet (10 mg total) by mouth 2 (two) times daily as needed for muscle spasms.     Debroah Baller [place], [place] 11/05/16 1648    Davonna Belling, MD 11/06/16 [phone]

## 2016-11-05 NOTE — Discharge Instructions (Signed)
Follow up with the orthopedic doctor as planned.

## 2016-11-05 NOTE — ED Triage Notes (Signed)
Pt comes in for a fall on Thursday. Pt states she has pain in her knees chronically and this is what made her fall. Pt fell onto her right knee and right elbow. Denies hitting her head or and loss of consciousness.

## 2016-11-07 ENCOUNTER — Telehealth: Payer: Self-pay | Admitting: *Deleted

## 2016-11-07 ENCOUNTER — Other Ambulatory Visit: Payer: Self-pay

## 2016-11-07 MED ORDER — TIZANIDINE HCL 4 MG PO TABS: 4.0000 mg | ORAL_TABLET | Freq: Three times a day (TID) | ORAL | 0 refills | Status: DC | PRN

## 2016-11-07 NOTE — Patient Outreach (Signed)
Outreach patient after ED visit on 11/05/16.  Patient did not have Doctors contact information and asked for that to be left on voicemail along with [facility] 24 hour Nurse Advice Line.  Patient is going to reach out to Doctor regarding medication and follow up appointment.  Patient stated she has a care giver and does not need any services from [facility] at this time.

## 2016-11-07 NOTE — Telephone Encounter (Signed)
Received call from patient.   Reports that she was seen in ER over the weekend for sciatic nerve pain and given Flexeril.   Patient insurance does not cover flexeril. Patient would like to change to Zanaflex.   MD please advise.

## 2016-11-07 NOTE — Telephone Encounter (Signed)
Prescription sent to pharmacy. .   Call placed to patient and patient made aware.  

## 2016-11-07 NOTE — Telephone Encounter (Signed)
Okay zanaflex 4mg  1 po Q 8 hrs prn pain #20

## 2016-11-08 DIAGNOSIS — M25561 Pain in right knee: Secondary | ICD-10-CM | POA: Diagnosis not present

## 2016-11-08 DIAGNOSIS — M21161 Varus deformity, not elsewhere classified, right knee: Secondary | ICD-10-CM | POA: Diagnosis not present

## 2016-11-08 DIAGNOSIS — M21162 Varus deformity, not elsewhere classified, left knee: Secondary | ICD-10-CM | POA: Diagnosis not present

## 2016-11-08 DIAGNOSIS — S83142A Lateral subluxation of proximal end of tibia, left knee, initial encounter: Secondary | ICD-10-CM | POA: Diagnosis not present

## 2016-11-08 DIAGNOSIS — M170 Bilateral primary osteoarthritis of knee: Secondary | ICD-10-CM | POA: Diagnosis not present

## 2016-11-08 DIAGNOSIS — S83141A Lateral subluxation of proximal end of tibia, right knee, initial encounter: Secondary | ICD-10-CM | POA: Diagnosis not present

## 2016-11-08 DIAGNOSIS — M25562 Pain in left knee: Secondary | ICD-10-CM | POA: Diagnosis not present

## 2016-11-09 ENCOUNTER — Other Ambulatory Visit: Payer: Self-pay | Admitting: Family Medicine

## 2016-11-09 ENCOUNTER — Telehealth: Payer: Self-pay | Admitting: *Deleted

## 2016-11-09 NOTE — Telephone Encounter (Signed)
Received call from patient.   Reports that she has been seen by Dr. Ronnie Derby and was advised that she will need to have knee replacement. States that she has to loose 40lbs before he will do surgery, but she is in pain. Requested MD to assess x-rays and prescribe "narcotic" for pain.   Advised that patient will need to be seen before any new prescription will be given.

## 2016-11-16 ENCOUNTER — Ambulatory Visit (INDEPENDENT_AMBULATORY_CARE_PROVIDER_SITE_OTHER): Payer: Medicare Other | Admitting: "Endocrinology

## 2016-11-16 ENCOUNTER — Encounter: Payer: Self-pay | Admitting: "Endocrinology

## 2016-11-16 VITALS — BP 128/79 | HR 71 | Ht 63.0 in | Wt 277.0 lb

## 2016-11-16 DIAGNOSIS — E0590 Thyrotoxicosis, unspecified without thyrotoxic crisis or storm: Secondary | ICD-10-CM | POA: Diagnosis not present

## 2016-11-16 NOTE — Progress Notes (Signed)
Subjective:    Patient ID: Annette Galvan, female    DOB: Nov 18, 1960, PCP Annette Rossetti, MD   Past Medical History:  Diagnosis Date  . Arthritis    needs 2 knee replacements  . Back pain, chronic   . Bilateral chronic knee pain   . Bipolar 1 disorder ([place])   . Bulging disc   . Fibromyalgia   . GERD (gastroesophageal reflux disease)   . HSV-2 (herpes simplex virus 2) infection 2013  . Hx of trichomoniasis   . Itching 06/26/2013  . Leg pain   . Trichimoniasis 06/26/2013  . UTI (lower urinary tract infection) 10/12/2012  . Vaginal discharge 06/26/2013  . Vaginal irritation 12/05/2013  . Vaginal odor 12/28/2012   Had discharge +clue  . Yeast infection 02/04/2013   Past Surgical History:  Procedure Laterality Date  . ABDOMINAL HYSTERECTOMY    . BREAST ENHANCEMENT SURGERY Right   . COLONOSCOPY WITH PROPOFOL N/A 12/07/2015   Procedure: COLONOSCOPY WITH PROPOFOL;  Surgeon: Daneil Dolin, MD;  Location: [facility];  Service: Endoscopy;  Laterality: N/A;  915  . TUBAL LIGATION     Social History   Social History  . Marital status: Widowed    Spouse name: N/A  . Number of children: N/A  . Years of education: N/A   Social History Main Topics  . Smoking status: Light Tobacco Smoker    Packs/day: 0.25    Years: 25.00    Types: Cigarettes  . Smokeless tobacco: Never Used     Comment: 2 cigarettes daily  . Alcohol use 0.0 oz/week     Comment: beer occasionally  . Drug use: No     Comment: cocaine, clean for 4.5 years as of 12/04/2015  . Sexual activity: Yes    Birth control/ protection: Surgical   Other Topics Concern  . None   Social History Narrative  . None   Outpatient Encounter Prescriptions as of 11/16/2016  Medication Sig  . ALPRAZolam (XANAX) 1 MG tablet Take 1 tablet (1 mg total) by mouth 4 (four) times daily as needed for anxiety.  . cetirizine (ZYRTEC) 10 MG tablet TAKE 1 TABLET BY MOUTH ONCE DAILY.  Marland Kitchen diclofenac sodium (VOLTAREN) 1 % GEL APPLY 2 GRAMS  TO THE AFFECTED AREAS FOUR TIMES DAILY AS DIRECTED.  . fluticasone (FLONASE) 50 MCG/ACT nasal spray INHALE 2 SPRAYS IN EACH NOSTRIL TWICE DAILY AS DIRECTED.  Marland Kitchen gabapentin (NEURONTIN) 400 MG capsule TAKE 1 CAPSULE BY MOUTH THREE TIMES A DAY.  . IBU 800 MG tablet TAKE ONE TABLET BY MOUTH 2 TIMES A DAY AS NEEDED FOR PAIN.  Marland Kitchen lamoTRIgine (LAMICTAL) 100 MG tablet Take 1 tablet (100 mg total) by mouth 2 (two) times daily.  Marland Kitchen omeprazole (PRILOSEC) 40 MG capsule TAKE (1) CAPSULE BY MOUTH ONCE DAILY FOR ACID REFLUX.  . prazosin (MINIPRESS) 2 MG capsule Take 1 capsule (2 mg total) by mouth at bedtime.  Marland Kitchen tiZANidine (ZANAFLEX) 4 MG tablet Take 1 tablet (4 mg total) by mouth every 8 (eight) hours as needed for muscle spasms.  . traMADol (ULTRAM) 50 MG tablet Take 1 tablet (50 mg total) by mouth every 12 (twelve) hours as needed.  . traZODone (DESYREL) 150 MG tablet Take 2 tablets (300 mg total) by mouth at bedtime.  . valACYclovir (VALTREX) 1000 MG tablet Take 1 tablet (1,000 mg total) by mouth 2 (two) times daily.   No facility-administered encounter medications on file as of 11/16/2016.    ALLERGIES:  Allergies  Allergen Reactions  . Hydrocodone Nausea And Vomiting  . Latex Swelling    Ankle Area  . Ampicillin Hives and Nausea And Vomiting    VACCINATION STATUS: Immunization History  Administered Date(s) Administered  . Influenza Split 04/23/2012  . Influenza,trivalent, recombinat, inj, PF 01/28/2013  . Pneumococcal Polysaccharide-23 05/26/2014  . Tdap 11/12/2014    HPI 56 year old female with medical history as above. She is being seen in f/u for subclinical hyperthyroidism .  - Her history includes documented suppressed TSH for at least 3 years, free T4 and free T3 within normal range.  - Review of her medical records include thyroid sonograms in May 2017 which showed 5.8 cm right lobe and 4.47 m left lobe with no nodules. She also had thyroid uptake and scan in May 2017 significant for only  6.9% uptake. - Her repeat thyroid function tests are still consistent with subclinical hyperthyroidism, see below. - She is currently losing weight, lost 7 pounds since last visit. - She denies heat nor cold intolerance. - She has chronic mild tremor of upper extremities. - Denies palpitations, she is not on any beta blockers. - She denies family history of thyroid dysfunction. She denies dysphagia, shortness of breath, nor voice change.  Review of Systems  Constitutional:  + lost 7 pounds , no fatigue, no subjective hyperthermia, no subjective hypothermia Eyes: no blurry vision, no xerophthalmia ENT: no sore throat, no nodules palpated in throat, no dysphagia/odynophagia, no hoarseness Cardiovascular: no Chest Pain, no Shortness of Breath, no palpitations, no leg swelling Respiratory: no cough, no SOB Gastrointestinal: no Nausea/Vomiting/Diarhhea Musculoskeletal: no muscle/joint aches Skin: no rashes Neurological: + tremors, no numbness, no tingling, no dizziness Psychiatric: + depression, + anxiety  Objective:    BP 128/79   Pulse 71   Ht 5\' 3"  (1.6 m)   Wt 277 lb (125.6 kg)   BMI 49.07 kg/m   Wt Readings from Last 3 Encounters:  11/16/16 277 lb (125.6 kg)  11/05/16 284 lb (128.8 kg)  10/07/16 275 lb 3.2 oz (124.8 kg)    Physical Exam  Constitutional: obese, not in acute distress, normal state of mind Eyes: PERRLA, EOMI, no exophthalmos ENT: moist mucous membranes, mild palpable thyromegaly, no cervical lymphadenopathy Cardiovascular: normal precordial activity, Regular Rate and Rhythm, no Murmur/Rubs/Gallops Respiratory:  adequate breathing efforts, no gross chest deformity, Clear to auscultation bilaterally Gastrointestinal: abdomen soft, Non -tender, No distension, Bowel Sounds present Musculoskeletal: no gross deformities, strength intact in all four extremities, + large extremities. Skin: moist, warm, no rashes Neurological: + tremor with outstretched hands, Deep  tendon reflexes normal in all four extremities.  CMP ( most recent) CMP     Component Value Date/Time   NA 141 08/25/2016 0105   K 3.5 08/25/2016 0105   CL 107 08/25/2016 0105   CO2 25 08/25/2016 0105   GLUCOSE 112 (H) 08/25/2016 0105   BUN 13 08/25/2016 0105   CREATININE 0.76 08/25/2016 0105   CREATININE 0.85 08/12/2016 1057   CALCIUM 8.8 (L) 08/25/2016 0105   PROT 7.3 08/12/2016 1057   ALBUMIN 4.1 08/12/2016 1057   AST 16 08/12/2016 1057   ALT 11 08/12/2016 1057   ALKPHOS 69 08/12/2016 1057   BILITOT 0.2 08/12/2016 1057   GFRNONAA >60 08/25/2016 0105   GFRAA >60 08/25/2016 0105     Diabetic Labs (most recent): Lab Results  Component Value Date   HGBA1C 5.2 08/12/2016   HGBA1C 5.7 (H) 06/01/2015   HGBA1C 5.5 05/26/2014  Lipid Panel ( most recent) Lipid Panel     Component Value Date/Time   CHOL 177 08/12/2016 1057   TRIG 114 08/12/2016 1057   HDL 68 08/12/2016 1057   CHOLHDL 2.6 08/12/2016 1057   VLDL 23 08/12/2016 1057   LDLCALC 86 08/12/2016 1057    Results for Annette Galvan, Annette Galvan (MRN [id number]) as of 11/16/2016 10:27  Ref. Range 08/12/2016 10:57 10/14/2016 12:23  TSH Latest Units: mIU/L 0.35 (L) 0.27 (L)  Triiodothyronine,Free,Serum Latest Ref Range: 2.3 - 4.2 pg/mL  3.2  T4,Free(Direct) Latest Ref Range: 0.8 - 1.8 ng/dL  1.3  Thyroglobulin Ab Latest Ref Range: <2 IU/mL  <1  Thyroperoxidase Ab SerPl-aCnc Latest Ref Range: <9 IU/mL  2     Assessment & Plan:   1. Subclinical hyperthyroidism - Her thyroid function tests are still consistent with subclinical hyperthyroidism, with mildly suppressed TSH of 0.27 and normal free T4 and free T3. Her antithyroid antibodies are negative. - She will not need any intervention at this time, but will need repeat TSH/free T4 in 6 months. - Her thyroid sonogram from May 2017 was unremarkable, thyroid uptake and scan from May 2017 was significant for 6.9% uptake with no focal findings.  - I advised patient to maintain close  follow up with Annette Rossetti, MD for primary care needs. Follow up plan: Return in about 6 months (around 05/19/2017) for follow up with pre-visit labs.  Glade Lloyd, MD Phone: [phone]  Fax: [phone]   11/16/2016, 11:09 AM

## 2016-11-21 ENCOUNTER — Other Ambulatory Visit: Payer: Self-pay | Admitting: Family Medicine

## 2016-11-21 ENCOUNTER — Telehealth: Payer: Self-pay | Admitting: *Deleted

## 2016-11-21 NOTE — Telephone Encounter (Signed)
Received call from patient.   States that she has been referred to [facility], but center is not covered by inurance.   Please look into this.

## 2016-11-25 NOTE — Telephone Encounter (Signed)
Kim Can you please look into this I don't see a referral for this?

## 2016-12-02 NOTE — Telephone Encounter (Signed)
I called patient and gave her the numbers to the Dietician at [facility] and number to [facility]

## 2016-12-20 ENCOUNTER — Other Ambulatory Visit: Payer: Self-pay | Admitting: Family Medicine

## 2016-12-21 ENCOUNTER — Ambulatory Visit (HOSPITAL_COMMUNITY): Payer: Medicare Other | Admitting: Psychiatry

## 2016-12-26 ENCOUNTER — Encounter (HOSPITAL_COMMUNITY): Payer: Self-pay | Admitting: Psychiatry

## 2016-12-26 ENCOUNTER — Ambulatory Visit (INDEPENDENT_AMBULATORY_CARE_PROVIDER_SITE_OTHER): Payer: Medicare Other | Admitting: Psychiatry

## 2016-12-26 VITALS — BP 152/86 | HR 70 | Ht 63.0 in | Wt 274.6 lb

## 2016-12-26 DIAGNOSIS — F419 Anxiety disorder, unspecified: Secondary | ICD-10-CM | POA: Diagnosis not present

## 2016-12-26 DIAGNOSIS — Z62810 Personal history of physical and sexual abuse in childhood: Secondary | ICD-10-CM

## 2016-12-26 DIAGNOSIS — Z818 Family history of other mental and behavioral disorders: Secondary | ICD-10-CM | POA: Diagnosis not present

## 2016-12-26 DIAGNOSIS — Z811 Family history of alcohol abuse and dependence: Secondary | ICD-10-CM

## 2016-12-26 DIAGNOSIS — F515 Nightmare disorder: Secondary | ICD-10-CM | POA: Diagnosis not present

## 2016-12-26 DIAGNOSIS — Z813 Family history of other psychoactive substance abuse and dependence: Secondary | ICD-10-CM

## 2016-12-26 DIAGNOSIS — F4310 Post-traumatic stress disorder, unspecified: Secondary | ICD-10-CM

## 2016-12-26 MED ORDER — TRAZODONE HCL 150 MG PO TABS: 300.0000 mg | ORAL_TABLET | Freq: Every day | ORAL | 3 refills | Status: DC

## 2016-12-26 MED ORDER — PRAZOSIN HCL 2 MG PO CAPS: 2.0000 mg | ORAL_CAPSULE | Freq: Every day | ORAL | 2 refills | Status: DC

## 2016-12-26 MED ORDER — ALPRAZOLAM 1 MG PO TABS: 1.0000 mg | ORAL_TABLET | Freq: Four times a day (QID) | ORAL | 2 refills | Status: DC | PRN

## 2016-12-26 MED ORDER — LAMOTRIGINE 100 MG PO TABS: 100.0000 mg | ORAL_TABLET | Freq: Two times a day (BID) | ORAL | 2 refills | Status: DC
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
## 2016-12-28 ENCOUNTER — Other Ambulatory Visit: Payer: Self-pay | Admitting: Family Medicine

## 2017-01-02 IMAGING — CT CT PELVIS W/O CM
2 of 3 series · 16 of 46 positions shown, 18 images · non-contrast
Comparison: None.

CLINICAL DATA: Right groin abscess.  Bleeding.  Cellulitis.

EXAM:
CT PELVIS WITHOUT CONTRAST
TECHNIQUE: Multidetector CT imaging of the pelvis was performed following the
standard protocol without intravenous contrast.

[Series 2: axial st · axial · 1.27mm/px · z∈[+457,+707]mm · 13 of 58 slices shown, 15 images]
[im 4/58  soft-tissue]
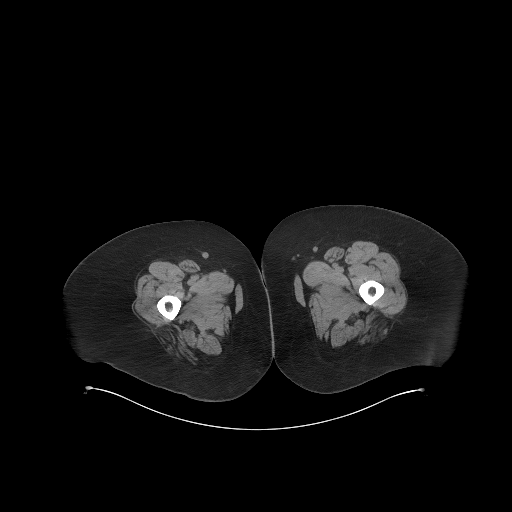
[im 4/58  bone]
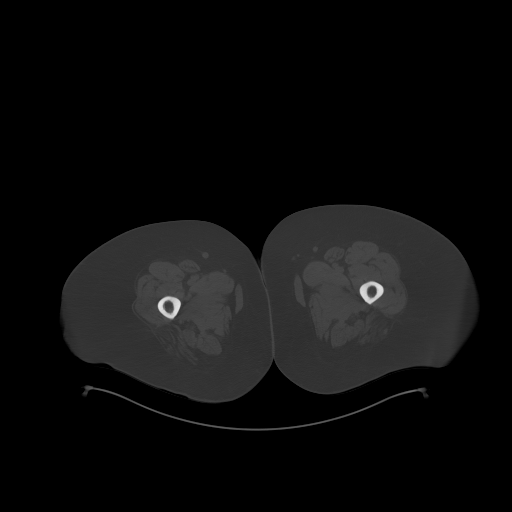
[im 8/58  soft-tissue]
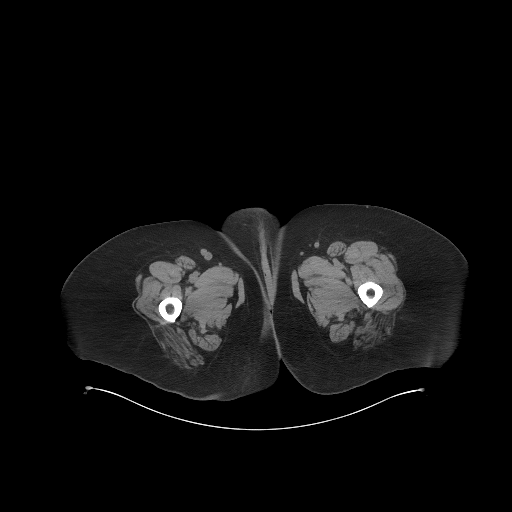
[im 12/58  soft-tissue]
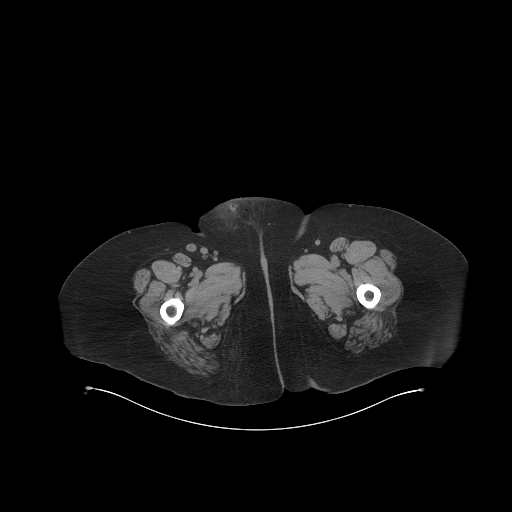
[im 17/58  soft-tissue]
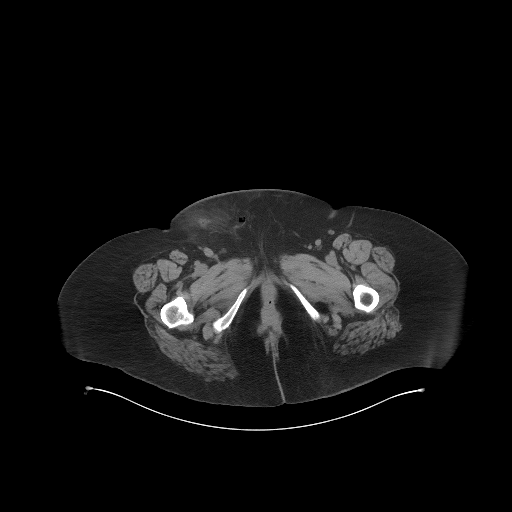
[im 21/58  soft-tissue]
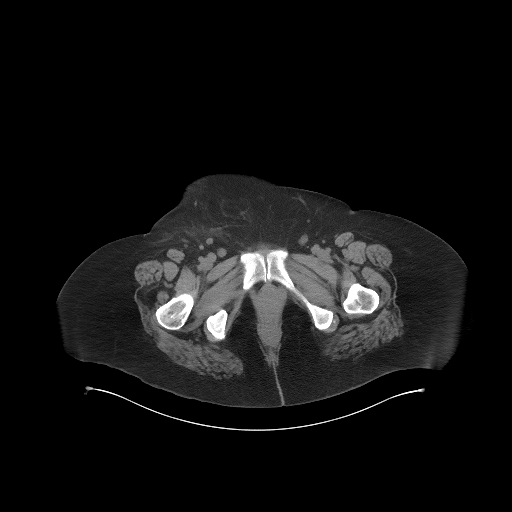
[im 24/58  soft-tissue]
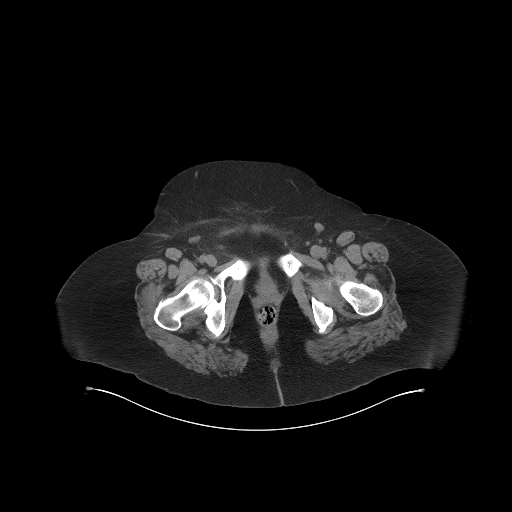
[im 30/58  soft-tissue]
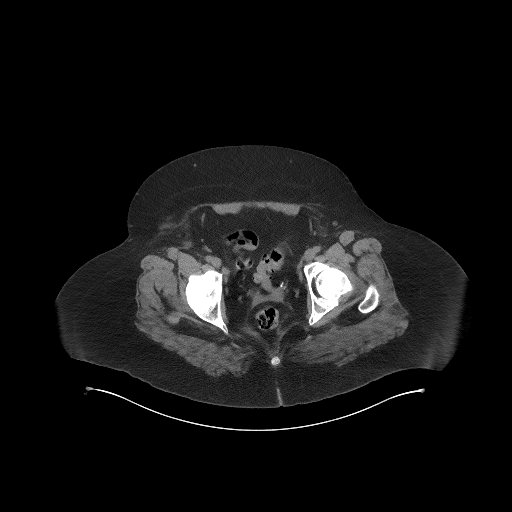
[im 34/58  soft-tissue]
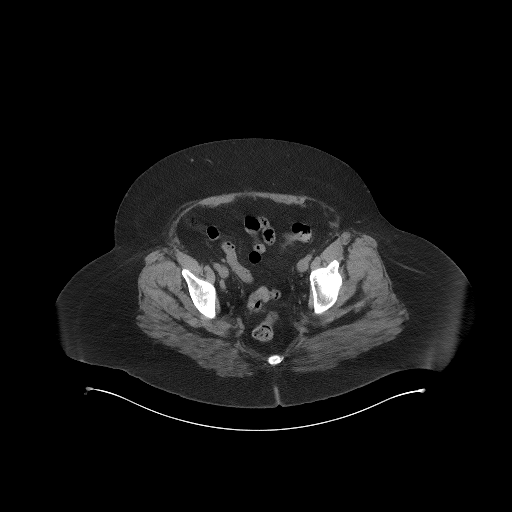
[im 37/58  soft-tissue]
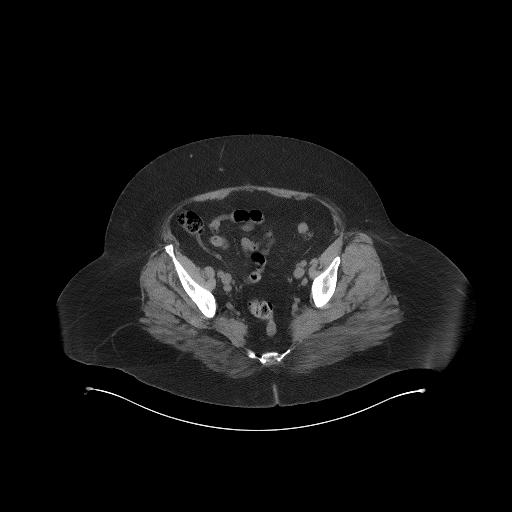
[im 37/58  bone]
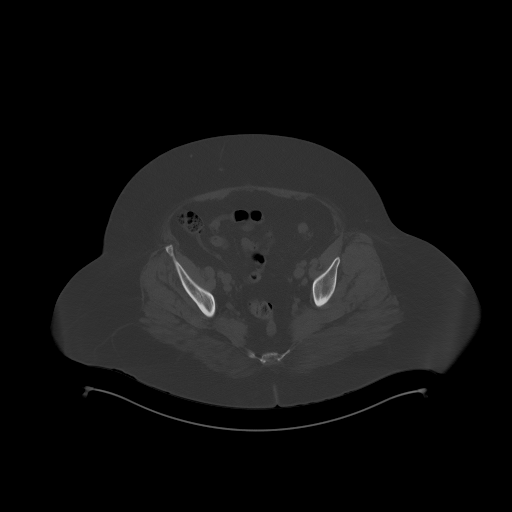
[im 41/58  soft-tissue]
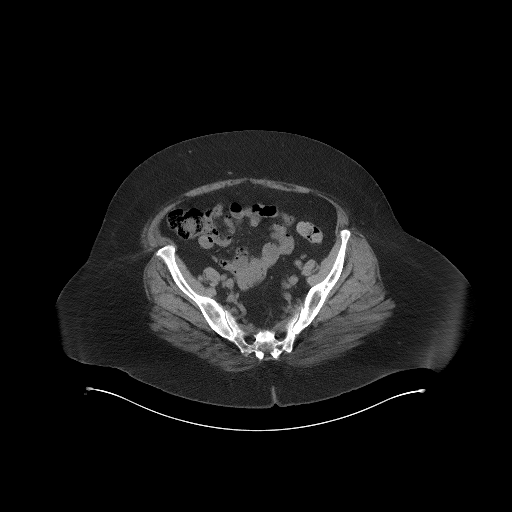
[im 46/58  soft-tissue]
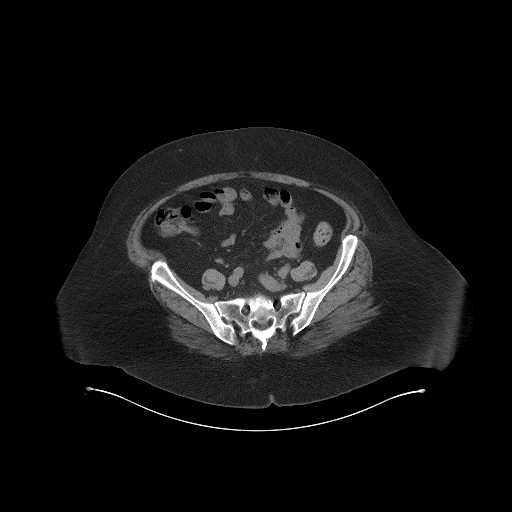
[im 50/58  soft-tissue]
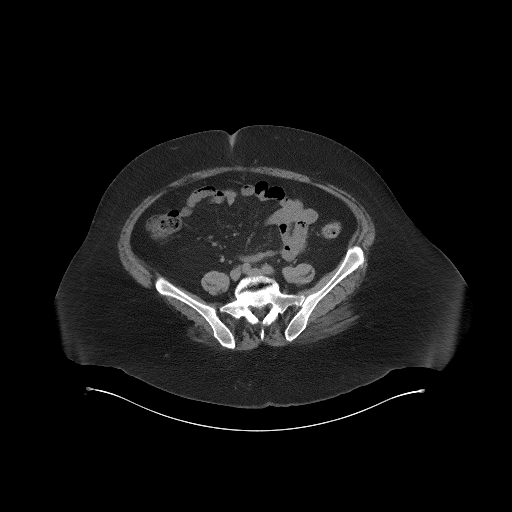
[im 54/58  soft-tissue]
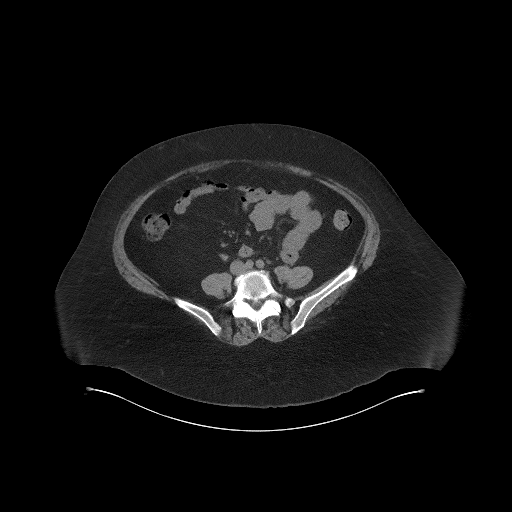

[Series 4: coronal st · coronal · 0.66mm/px · 3 of 122 slices shown]
[im 41/122  soft-tissue]
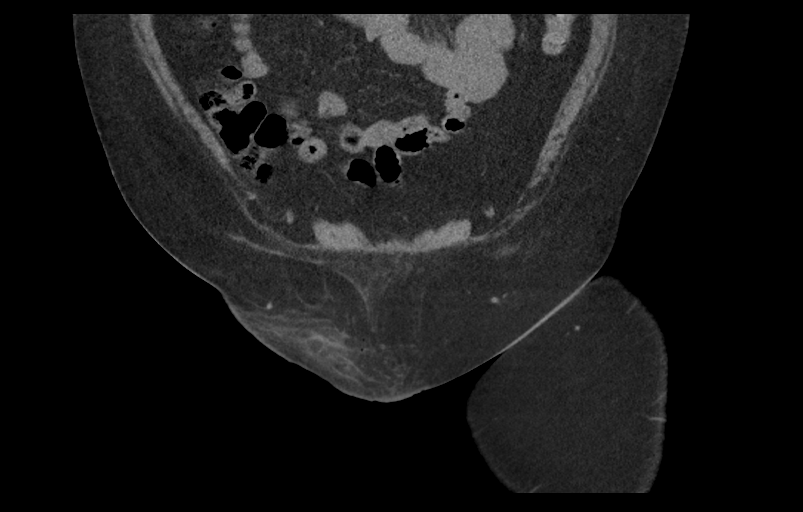
[im 54/122  soft-tissue]
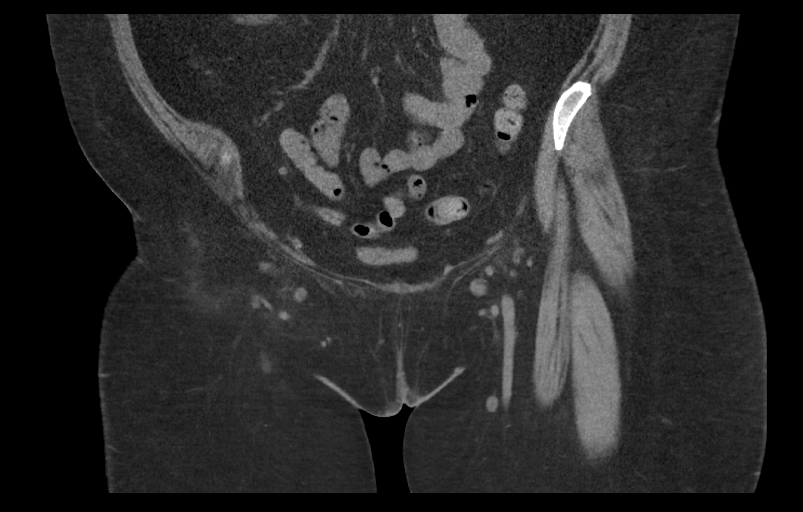
[im 68/122  soft-tissue]
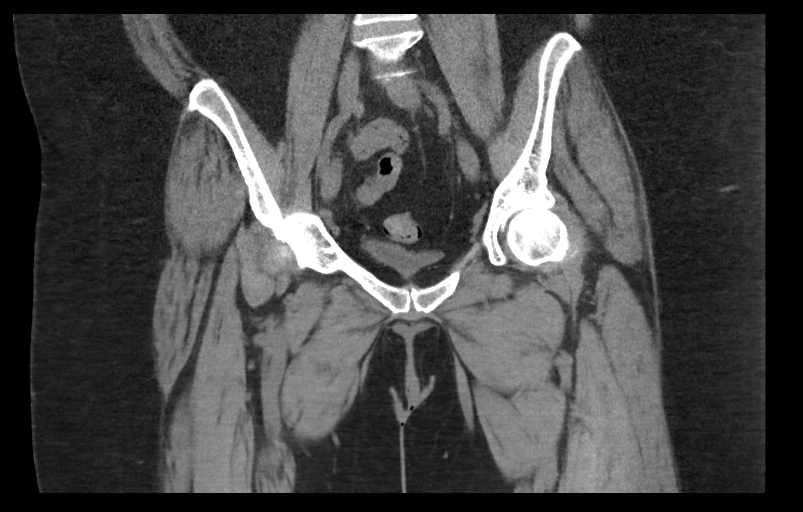

[16 of 46 positions shown; findings below may reference images not displayed]

FINDINGS: Urinary Tract:  No distal hydroureter.  No bladder stone.

Bowel: Scattered colonic diverticula. Normal terminal ileum and
appendix. Normal pelvic small bowel.

Vascular/Lymphatic: No pelvic aneurysm. Prominent right inguinal
nodes are likely reactive. Example 1.3 cm on image 37/series 2.

Reproductive:  Hysterectomy.  No adnexal mass.

Other: No significant free fluid. Subcutaneous edema and skin
thickening about the right groin with mild extension towards the
labia. Small foci of soft tissue gas identified, without
well-defined fluid collection. A focus of hyperattenuation within
the subcutaneous fat of the right mons pubis, including at 13 mm on
image 48/series 2.

Musculoskeletal: Degenerative disc disease at the lumbosacral
junction.
IMPRESSION: 1. Subcutaneous edema and skin thickening about the right inguinal
region, extending into the right-side of the mons pubis and labia.
Foci of soft tissue gas, without well-defined fluid collection to
suggest abscess. Hyper attenuating focus within for which radiopaque
foreign object cannot be excluded.
2. Right inguinal adenopathy is likely reactive.

## 2017-01-09 ENCOUNTER — Encounter: Payer: Self-pay | Admitting: Family Medicine

## 2017-01-09 ENCOUNTER — Ambulatory Visit (INDEPENDENT_AMBULATORY_CARE_PROVIDER_SITE_OTHER): Payer: Medicare Other | Admitting: Family Medicine

## 2017-01-09 VITALS — BP 110/70 | HR 84 | Temp 97.9°F | Resp 20 | Wt 277.6 lb

## 2017-01-09 DIAGNOSIS — B372 Candidiasis of skin and nail: Secondary | ICD-10-CM | POA: Insufficient documentation

## 2017-01-09 DIAGNOSIS — M5136 Other intervertebral disc degeneration, lumbar region: Secondary | ICD-10-CM | POA: Diagnosis not present

## 2017-01-09 DIAGNOSIS — R8299 Other abnormal findings in urine: Secondary | ICD-10-CM

## 2017-01-09 DIAGNOSIS — Z23 Encounter for immunization: Secondary | ICD-10-CM | POA: Diagnosis not present

## 2017-01-09 DIAGNOSIS — R82998 Other abnormal findings in urine: Secondary | ICD-10-CM

## 2017-01-09 DIAGNOSIS — M170 Bilateral primary osteoarthritis of knee: Secondary | ICD-10-CM | POA: Diagnosis not present

## 2017-01-09 LAB — URINALYSIS, ROUTINE W REFLEX MICROSCOPIC
Bilirubin Urine: NEGATIVE
GLUCOSE, UA: NEGATIVE
Hgb urine dipstick: NEGATIVE
Ketones, ur: NEGATIVE
LEUKOCYTES UA: NEGATIVE
Nitrite: NEGATIVE
PROTEIN: NEGATIVE
Specific Gravity, Urine: 1.02 (ref 1.001–1.03)
pH: 6 (ref 5.0–8.0)

## 2017-01-09 MED ORDER — IBUPROFEN 800 MG PO TABS: ORAL_TABLET | ORAL | 2 refills | Status: DC

## 2017-01-09 MED ORDER — TRAMADOL HCL 50 MG PO TABS: 50.0000 mg | ORAL_TABLET | Freq: Two times a day (BID) | ORAL | 2 refills | Status: DC | PRN

## 2017-01-09 MED ORDER — NYSTATIN 100000 UNIT/GM EX POWD: Freq: Four times a day (QID) | CUTANEOUS | 2 refills | Status: DC

## 2017-01-09 NOTE — Patient Instructions (Addendum)
Eat more veggies/ chicken/turkey for dinner Eat boiled eggs for breakfast/ oatmeal/grits Cut out the night time eating  Drink water Flu shot given F/U 4 months

## 2017-01-09 NOTE — Assessment & Plan Note (Signed)
Given handout on nutrition. I understand that she is not very limited budget due to finances. We discussed using the canned vegetables as she does eat canned fruit she should bring the sugar off. Limit to one piece of bread when she has her sandwiches. Stop late night snacking. Drinking water. [facility]

## 2017-01-09 NOTE — Assessment & Plan Note (Signed)
Medications per above no sign of UTI

## 2017-01-09 NOTE — Assessment & Plan Note (Signed)
Refilled Ibuprofen and continued on Ultram Concern with her being on benzodiazepines and her other psychiatric medications pending her on hydrocodone chronically. Discussed her weight losing weight with improved but her back pain and her knee pain.

## 2017-01-09 NOTE — Progress Notes (Signed)
Subjective:    Patient ID: Annette Galvan, female    DOB: [birth year], 56 y.o.   MRN: [id number]  Patient presents for Rash (in groin area,  also c/o dark urine and back pain) and Flu Vaccine   Mild dysuria for past week and low back pain, no odor, no fever, no N/VShe wanted to have her urine Was not sure if it was just this or her chronic back pain    Rash in groin region, feels like her yeast infection again on skin, was using OTC medications    She has been trying to lose weight insurance would not cover for her to go to nutrition He states that she was doing well but then started picking up bad habits. She eats on a budget and often gets seen which food can fruits and vegetables. She admits to snacking late at night but tries to avoid getting sweets in her house. She has been trying to get to the senior center and workout on the bicycle.    She's been to orthopedics for her bilateral osteoarthritis needs knee replacement but has to lose 40 pounds before they would do the surgery Interns also will not cover any weight loss medication    Due for Flu shot    Review Of Systems:  GEN- denies fatigue, fever, weight loss,weakness, recent illness HEENT- denies eye drainage, change in vision, nasal discharge, CVS- denies chest pain, palpitations RESP- denies SOB, cough, wheeze ABD- denies N/V, change in stools, abd pain GU- +dysuria, hematuria, dribbling, incontinence MSK- + joint pain, muscle aches, injury Neuro- denies headache, dizziness, syncope, seizure activity       Objective:    BP 110/70 (BP Location: Right Arm, Patient Position: Sitting, Cuff Size: Large)   Pulse 84   Temp 97.9 F (36.6 C) (Oral)   Resp 20   Wt 277 lb 9.6 oz (125.9 kg)   BMI 49.17 kg/m  GEN- NAD, alert and oriented x3 HEENT- PERRL, EOMI, non injected sclera, pink conjunctiva, MMM, oropharynx clear CVS- RRR, no murmur RESP-CTAB ABD-NABS,soft,NT,ND, no CVA tenderness MSK- mild TTP lumbar spine, neg  SLR Skin- mild erythema bilat groin  EXT- No edema Pulses- Radial 2+        Assessment & Plan:      Problem List Items Addressed This Visit      Unprioritized   OA (osteoarthritis) of knee    Refilled Ibuprofen and continued on Ultram Concern with her being on benzodiazepines and her other psychiatric medications pending her on hydrocodone chronically. Discussed her weight losing weight with improved but her back pain and her knee pain.      Relevant Medications   ibuprofen (IBU) 800 MG tablet   traMADol (ULTRAM) 50 MG tablet   Morbid obesity ([place])    Given handout on nutrition. I understand that she is not very limited budget due to finances. We discussed using the canned vegetables as she does eat canned fruit she should bring the sugar off. Limit to one piece of bread when she has her sandwiches. Stop late night snacking. Drinking water. She will continue at senior center      DDD (degenerative disc disease), lumbar    Medications per above no sign of UTI      Relevant Medications   ibuprofen (IBU) 800 MG tablet   traMADol (ULTRAM) 50 MG tablet   Candidal intertrigo   Relevant Medications   nystatin (MYCOSTATIN/NYSTOP) powder    Other Visit Diagnoses    Need  for prophylactic vaccination and inoculation against influenza    -  Primary   Relevant Orders   Flu Vaccine QUAD 36+ mos IM   Dark urine       Relevant Orders   Urinalysis, Routine w reflex microscopic (Completed)      Note: This dictation was prepared with Dragon dictation along with smaller phrase technology. Any transcriptional errors that result from this process are unintentional.

## 2017-01-17 ENCOUNTER — Other Ambulatory Visit: Payer: Self-pay | Admitting: Family Medicine

## 2017-01-19 ENCOUNTER — Other Ambulatory Visit: Payer: Self-pay | Admitting: Family Medicine

## 2017-01-19 ENCOUNTER — Telehealth: Payer: Self-pay | Admitting: Family Medicine

## 2017-01-19 MED ORDER — FLUTICASONE PROPIONATE 50 MCG/ACT NA SUSP: NASAL | 2 refills | Status: DC

## 2017-01-19 NOTE — Telephone Encounter (Signed)
Pt wants refill on flonase sent to [facility]

## 2017-01-19 NOTE — Telephone Encounter (Signed)
Medication called/sent to requested pharmacy  

## 2017-02-06 ENCOUNTER — Other Ambulatory Visit: Payer: Self-pay | Admitting: Family Medicine

## 2017-02-13 ENCOUNTER — Other Ambulatory Visit: Payer: Self-pay | Admitting: Family Medicine

## 2017-02-24 ENCOUNTER — Other Ambulatory Visit: Payer: Self-pay | Admitting: Family Medicine

## 2017-03-06 ENCOUNTER — Other Ambulatory Visit: Payer: Self-pay | Admitting: Family Medicine

## 2017-03-09 ENCOUNTER — Other Ambulatory Visit: Payer: Self-pay | Admitting: Family Medicine

## 2017-03-16 ENCOUNTER — Other Ambulatory Visit (HOSPITAL_COMMUNITY): Payer: Self-pay | Admitting: Psychiatry

## 2017-03-20 ENCOUNTER — Other Ambulatory Visit: Payer: Self-pay | Admitting: Family Medicine

## 2017-03-20 NOTE — Telephone Encounter (Signed)
Medication called to pharmacy. 

## 2017-03-20 NOTE — Telephone Encounter (Signed)
Ok to refill 

## 2017-03-20 NOTE — Telephone Encounter (Signed)
okay

## 2017-03-21 ENCOUNTER — Telehealth: Payer: Self-pay | Admitting: Family Medicine

## 2017-03-21 MED ORDER — PERMETHRIN 5 % EX CREA: 1.0000 "application " | TOPICAL_CREAM | Freq: Once | CUTANEOUS | 1 refills | Status: AC

## 2017-03-21 NOTE — Telephone Encounter (Signed)
Send the permethrin cream 5% , she can repeat in 1 week

## 2017-03-21 NOTE — Telephone Encounter (Signed)
Call placed to patient and patient made aware.   Also advised to wash linens in hot water and to dry on high.

## 2017-03-21 NOTE — Telephone Encounter (Signed)
Patient called in states she went to a friends house who had scabies all in her home. She now has a rash she wasn't aware of the friend having scabies. She states she called her friend and was telling her about the rash and that is when the other person told the patient that her house had scabies. She would like something called into [place].   CB# [phone]

## 2017-03-21 NOTE — Telephone Encounter (Signed)
MD please advise

## 2017-04-06 ENCOUNTER — Telehealth: Payer: Self-pay | Admitting: *Deleted

## 2017-04-06 DIAGNOSIS — M170 Bilateral primary osteoarthritis of knee: Secondary | ICD-10-CM

## 2017-04-06 NOTE — Telephone Encounter (Signed)
Received VM from patient.   Requested referral to Sports Medicine for Dr. Vickey Huger for cortisone injections to knees.   Ok to order?

## 2017-04-07 NOTE — Telephone Encounter (Signed)
Per [facility] ([phone]) [phone]~telephone/ [phone]~ fax. (Dr. Ronnie Derby), referral required for Medicaid.   Faxed new referral.

## 2017-04-07 NOTE — Telephone Encounter (Signed)
She had referral placed in March for him, she should be able to just call and get an appointment

## 2017-04-12 ENCOUNTER — Other Ambulatory Visit: Payer: Self-pay | Admitting: Family Medicine

## 2017-04-13 DIAGNOSIS — M170 Bilateral primary osteoarthritis of knee: Secondary | ICD-10-CM | POA: Diagnosis not present

## 2017-04-13 NOTE — Telephone Encounter (Signed)
Received referral notes back with appointment information: 04/13/2017 @ 1:50pm.

## 2017-04-19 ENCOUNTER — Ambulatory Visit (INDEPENDENT_AMBULATORY_CARE_PROVIDER_SITE_OTHER): Payer: Medicare Other | Admitting: Psychiatry

## 2017-04-19 ENCOUNTER — Encounter (HOSPITAL_COMMUNITY): Payer: Self-pay | Admitting: Psychiatry

## 2017-04-19 VITALS — BP 138/89 | HR 81 | Ht 63.0 in | Wt 277.0 lb

## 2017-04-19 DIAGNOSIS — F4310 Post-traumatic stress disorder, unspecified: Secondary | ICD-10-CM

## 2017-04-19 DIAGNOSIS — F17210 Nicotine dependence, cigarettes, uncomplicated: Secondary | ICD-10-CM | POA: Diagnosis not present

## 2017-04-19 DIAGNOSIS — M2550 Pain in unspecified joint: Secondary | ICD-10-CM | POA: Diagnosis not present

## 2017-04-19 DIAGNOSIS — Z818 Family history of other mental and behavioral disorders: Secondary | ICD-10-CM | POA: Diagnosis not present

## 2017-04-19 DIAGNOSIS — Z810 Family history of intellectual disabilities: Secondary | ICD-10-CM | POA: Diagnosis not present

## 2017-04-19 DIAGNOSIS — Z811 Family history of alcohol abuse and dependence: Secondary | ICD-10-CM | POA: Diagnosis not present

## 2017-04-19 DIAGNOSIS — Z813 Family history of other psychoactive substance abuse and dependence: Secondary | ICD-10-CM

## 2017-04-19 MED ORDER — ALPRAZOLAM 1 MG PO TABS: 1.0000 mg | ORAL_TABLET | Freq: Four times a day (QID) | ORAL | 2 refills | Status: DC | PRN

## 2017-04-19 MED ORDER — LAMOTRIGINE 100 MG PO TABS: 100.0000 mg | ORAL_TABLET | Freq: Two times a day (BID) | ORAL | 2 refills | Status: DC

## 2017-04-19 MED ORDER — TRAZODONE HCL 150 MG PO TABS: 300.0000 mg | ORAL_TABLET | Freq: Every day | ORAL | 3 refills | Status: DC
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
## 2017-04-24 ENCOUNTER — Other Ambulatory Visit: Payer: Self-pay | Admitting: Family Medicine

## 2017-04-28 ENCOUNTER — Other Ambulatory Visit: Payer: Self-pay | Admitting: Family Medicine

## 2017-05-13 ENCOUNTER — Other Ambulatory Visit: Payer: Self-pay | Admitting: Family Medicine

## 2017-05-22 ENCOUNTER — Other Ambulatory Visit: Payer: Self-pay | Admitting: Family Medicine

## 2017-05-25 ENCOUNTER — Other Ambulatory Visit: Payer: Self-pay | Admitting: Family Medicine

## 2017-05-25 ENCOUNTER — Other Ambulatory Visit (HOSPITAL_COMMUNITY): Payer: Self-pay | Admitting: Psychiatry

## 2017-05-25 NOTE — Telephone Encounter (Signed)
Ok to refill??  Last office visit 01/09/2017.  Last refill 03/20/2017.

## 2017-05-26 MED ORDER — GABAPENTIN 400 MG PO CAPS: ORAL_CAPSULE | ORAL | 2 refills | Status: DC

## 2017-05-26 MED ORDER — OMEPRAZOLE 40 MG PO CPDR: DELAYED_RELEASE_CAPSULE | ORAL | 2 refills | Status: DC

## 2017-05-26 MED ORDER — VALACYCLOVIR HCL 1 G PO TABS: ORAL_TABLET | ORAL | 0 refills | Status: DC

## 2017-06-05 ENCOUNTER — Other Ambulatory Visit: Payer: Self-pay | Admitting: Family Medicine

## 2017-06-12 ENCOUNTER — Other Ambulatory Visit (HOSPITAL_COMMUNITY): Payer: Self-pay | Admitting: Psychiatry

## 2017-06-12 ENCOUNTER — Other Ambulatory Visit: Payer: Self-pay | Admitting: Family Medicine

## 2017-06-21 ENCOUNTER — Other Ambulatory Visit: Payer: Self-pay | Admitting: Family Medicine

## 2017-06-21 NOTE — Telephone Encounter (Signed)
Ok to refill??  Last office visit 01/09/2017.  Last refill 05/26/2016.

## 2017-07-03 ENCOUNTER — Encounter (HOSPITAL_COMMUNITY): Payer: Self-pay | Admitting: Psychiatry

## 2017-07-03 ENCOUNTER — Ambulatory Visit (INDEPENDENT_AMBULATORY_CARE_PROVIDER_SITE_OTHER): Payer: Medicare Other | Admitting: Psychiatry

## 2017-07-03 ENCOUNTER — Other Ambulatory Visit: Payer: Self-pay | Admitting: Family Medicine

## 2017-07-03 VITALS — BP 152/99 | HR 73 | Ht 63.0 in | Wt 280.0 lb

## 2017-07-03 DIAGNOSIS — M549 Dorsalgia, unspecified: Secondary | ICD-10-CM

## 2017-07-03 DIAGNOSIS — Z813 Family history of other psychoactive substance abuse and dependence: Secondary | ICD-10-CM

## 2017-07-03 DIAGNOSIS — Z810 Family history of intellectual disabilities: Secondary | ICD-10-CM

## 2017-07-03 DIAGNOSIS — Z818 Family history of other mental and behavioral disorders: Secondary | ICD-10-CM

## 2017-07-03 DIAGNOSIS — F17210 Nicotine dependence, cigarettes, uncomplicated: Secondary | ICD-10-CM

## 2017-07-03 DIAGNOSIS — Z62810 Personal history of physical and sexual abuse in childhood: Secondary | ICD-10-CM

## 2017-07-03 DIAGNOSIS — F4310 Post-traumatic stress disorder, unspecified: Secondary | ICD-10-CM | POA: Diagnosis not present

## 2017-07-03 DIAGNOSIS — Z736 Limitation of activities due to disability: Secondary | ICD-10-CM

## 2017-07-03 DIAGNOSIS — Z811 Family history of alcohol abuse and dependence: Secondary | ICD-10-CM

## 2017-07-03 DIAGNOSIS — M2550 Pain in unspecified joint: Secondary | ICD-10-CM | POA: Diagnosis not present

## 2017-07-03 MED ORDER — LAMOTRIGINE 100 MG PO TABS: 100.0000 mg | ORAL_TABLET | Freq: Two times a day (BID) | ORAL | 2 refills | Status: DC

## 2017-07-03 MED ORDER — ALPRAZOLAM 1 MG PO TABS: 1.0000 mg | ORAL_TABLET | Freq: Four times a day (QID) | ORAL | 2 refills | Status: DC | PRN

## 2017-07-03 MED ORDER — TRAZODONE HCL 150 MG PO TABS: 300.0000 mg | ORAL_TABLET | Freq: Every day | ORAL | 3 refills | Status: DC
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
## 2017-07-13 ENCOUNTER — Other Ambulatory Visit: Payer: Self-pay | Admitting: Family Medicine

## 2017-07-18 ENCOUNTER — Ambulatory Visit (HOSPITAL_COMMUNITY): Payer: Self-pay | Admitting: Psychiatry

## 2017-07-18 ENCOUNTER — Other Ambulatory Visit: Payer: Self-pay | Admitting: Family Medicine

## 2017-07-18 NOTE — Telephone Encounter (Signed)
Ok to refill??  Last office visit 01/09/2017.  Last refill on Tramadol 06/21/2017.

## 2017-08-01 ENCOUNTER — Other Ambulatory Visit: Payer: Self-pay | Admitting: Family Medicine

## 2017-08-07 ENCOUNTER — Other Ambulatory Visit: Payer: Self-pay | Admitting: Family Medicine

## 2017-08-14 ENCOUNTER — Encounter: Payer: Self-pay | Admitting: Family Medicine

## 2017-08-21 ENCOUNTER — Other Ambulatory Visit: Payer: Self-pay | Admitting: Family Medicine

## 2017-08-23 ENCOUNTER — Encounter: Payer: Self-pay | Admitting: Family Medicine

## 2017-08-23 ENCOUNTER — Ambulatory Visit (INDEPENDENT_AMBULATORY_CARE_PROVIDER_SITE_OTHER): Payer: Medicare Other | Admitting: Family Medicine

## 2017-08-23 ENCOUNTER — Other Ambulatory Visit: Payer: Self-pay

## 2017-08-23 VITALS — BP 128/74 | HR 92 | Temp 98.5°F | Resp 16 | Ht 63.0 in | Wt 284.0 lb

## 2017-08-23 DIAGNOSIS — R0781 Pleurodynia: Secondary | ICD-10-CM

## 2017-08-23 DIAGNOSIS — W19XXXA Unspecified fall, initial encounter: Secondary | ICD-10-CM | POA: Diagnosis not present

## 2017-08-23 DIAGNOSIS — R300 Dysuria: Secondary | ICD-10-CM | POA: Diagnosis not present

## 2017-08-23 DIAGNOSIS — Z113 Encounter for screening for infections with a predominantly sexual mode of transmission: Secondary | ICD-10-CM

## 2017-08-23 DIAGNOSIS — Z0189 Encounter for other specified special examinations: Secondary | ICD-10-CM | POA: Diagnosis not present

## 2017-08-23 DIAGNOSIS — N390 Urinary tract infection, site not specified: Secondary | ICD-10-CM | POA: Diagnosis not present

## 2017-08-23 LAB — URINALYSIS, ROUTINE W REFLEX MICROSCOPIC
Bilirubin Urine: NEGATIVE
GLUCOSE, UA: NEGATIVE
KETONES UR: NEGATIVE
NITRITE: NEGATIVE
PH: 7 (ref 5.0–8.0)
Specific Gravity, Urine: 1.015 (ref 1.001–1.03)

## 2017-08-23 LAB — MICROSCOPIC MESSAGE

## 2017-08-23 MED ORDER — FLUCONAZOLE 150 MG PO TABS: 150.0000 mg | ORAL_TABLET | Freq: Once | ORAL | 1 refills | Status: AC

## 2017-08-23 MED ORDER — CEPHALEXIN 500 MG PO CAPS: 500.0000 mg | ORAL_CAPSULE | Freq: Four times a day (QID) | ORAL | 0 refills | Status: DC
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
## 2017-08-23 NOTE — Patient Instructions (Signed)
Get xray  Take antibiotics We will call with results F/U as previous

## 2017-08-24 LAB — URINE CULTURE
MICRO NUMBER: 90561643
Result:: NO GROWTH
SPECIMEN QUALITY:: ADEQUATE

## 2017-08-24 LAB — C. TRACHOMATIS/N. GONORRHOEAE RNA
C. TRACHOMATIS RNA, TMA: NOT DETECTED
N. GONORRHOEAE RNA, TMA: NOT DETECTED

## 2017-08-24 LAB — RPR: RPR Ser Ql: REACTIVE — AB

## 2017-08-24 LAB — HIV ANTIBODY (ROUTINE TESTING W REFLEX): HIV: NONREACTIVE

## 2017-08-24 LAB — RPR TITER: RPR Titer: 1:1 {titer} — ABNORMAL HIGH

## 2017-08-24 LAB — FLUORESCENT TREPONEMAL AB(FTA)-IGG-BLD: FLUORESCENT TREPONEMAL ABS: REACTIVE — AB

## 2017-08-25 ENCOUNTER — Telehealth: Payer: Self-pay | Admitting: Family Medicine

## 2017-08-25 NOTE — Telephone Encounter (Signed)
Patient calling about lab results [id number]

## 2017-08-25 NOTE — Telephone Encounter (Signed)
Please see labs for more information.

## 2017-08-26 ENCOUNTER — Other Ambulatory Visit: Payer: Self-pay | Admitting: Family Medicine

## 2017-08-29 ENCOUNTER — Other Ambulatory Visit: Payer: Self-pay | Admitting: Family Medicine

## 2017-08-29 NOTE — Telephone Encounter (Signed)
Ok to refill Tramadol??  Last office visit 08/23/2017.  Last refill 07/18/2017.

## 2017-09-14 ENCOUNTER — Ambulatory Visit: Payer: Self-pay | Admitting: Family Medicine

## 2017-09-19 ENCOUNTER — Encounter: Payer: Self-pay | Admitting: Family Medicine

## 2017-09-19 ENCOUNTER — Ambulatory Visit (HOSPITAL_COMMUNITY): Payer: Self-pay | Admitting: Psychiatry

## 2017-09-19 ENCOUNTER — Ambulatory Visit (INDEPENDENT_AMBULATORY_CARE_PROVIDER_SITE_OTHER): Payer: Medicare Other | Admitting: Family Medicine

## 2017-09-19 ENCOUNTER — Other Ambulatory Visit: Payer: Self-pay

## 2017-09-19 VITALS — BP 122/78 | HR 85 | Temp 97.7°F | Resp 16 | Ht 63.0 in | Wt 284.0 lb

## 2017-09-19 DIAGNOSIS — R300 Dysuria: Secondary | ICD-10-CM

## 2017-09-19 LAB — URINALYSIS, ROUTINE W REFLEX MICROSCOPIC
Bilirubin Urine: NEGATIVE
GLUCOSE, UA: NEGATIVE
HGB URINE DIPSTICK: NEGATIVE
Ketones, ur: NEGATIVE
LEUKOCYTES UA: NEGATIVE
Nitrite: NEGATIVE
PH: 5.5 (ref 5.0–8.0)
RBC / HPF: NONE SEEN /HPF (ref 0–2)
Specific Gravity, Urine: 1.025 (ref 1.001–1.03)

## 2017-09-19 LAB — MICROSCOPIC MESSAGE

## 2017-09-19 MED ORDER — PHENAZOPYRIDINE HCL 200 MG PO TABS: 200.0000 mg | ORAL_TABLET | Freq: Three times a day (TID) | ORAL | 0 refills | Status: DC | PRN

## 2017-09-19 MED ORDER — FLUCONAZOLE 150 MG PO TABS: 150.0000 mg | ORAL_TABLET | Freq: Once | ORAL | 0 refills | Status: AC

## 2017-09-19 MED ORDER — NITROFURANTOIN MONOHYD MACRO 100 MG PO CAPS: 100.0000 mg | ORAL_CAPSULE | Freq: Two times a day (BID) | ORAL | 0 refills | Status: AC

## 2017-09-19 NOTE — Progress Notes (Signed)
Patient ID: Annette Galvan, female    DOB: 1960-07-15, 57 y.o.   MRN: [id number]  PCP: Alycia Rossetti, MD  Chief Complaint  Patient presents with  . dysuira    did not complete antibiotics  . odor with urine    Subjective:   Annette Galvan is a 57 y.o. female, presents to clinic with CC of urinary frequency and urgency with dysuria and pressure to her low pelvis and bladder after urinating.  Her symptoms have reoccurred in the past several days after being recently treated for UTI and tested for STDs.  Denies any hematuria, abdominal pain, nausea, vomiting, flank pain, fever, chills, sweats.  No new sexual partners.  She states that UTI on 08/23/2017 was treated with Keflex, but she left her antibiotics accidentally in her daughter's car for several days, did not take as prescribed.  She did take Diflucan at the end of the antibiotics.  She denies any vaginal symptoms, discharge itching rash or swelling.    Patient Active Problem List   Diagnosis Date Noted  . Candidal intertrigo 01/09/2017  . Cellulitis 01/08/2016  . Polypharmacy 11/13/2015  . Subclinical hyperthyroidism 08/14/2015  . GERD (gastroesophageal reflux disease) 11/12/2014  . Breast asymmetry between native breast and reconstructed breast 05/26/2014  . Thoracic back pain 05/26/2014  . Inversion deformity of right foot 05/26/2014  . Trichimoniasis 06/26/2013  . Itching 06/26/2013  . OA (osteoarthritis) of knee 05/17/2013  . Acute sinusitis 05/17/2013  . Encounter for screening colonoscopy 12/26/2012  . Stye 09/13/2012  . Sleep apnea 09/13/2012  . Tobacco use 09/13/2012  . Elevated blood pressure (not hypertension) 02/10/2012  . Fibromyalgia 10/11/2011  . Morbid obesity ([place]) 08/29/2011  . Borderline diabetic 08/29/2011  . Bipolar 1 disorder ([place]) 08/29/2011  . Difficulty in walking(719.7) 05/10/2011  . DDD (degenerative disc disease), lumbar 04/04/2007     Prior to Admission medications   Medication Sig  Start Date End Date Taking? Authorizing Provider  ALPRAZolam Duanne Moron) 1 MG tablet Take 1 tablet (1 mg total) by mouth 4 (four) times daily as needed for anxiety. 07/03/17  Yes Cloria Spring, MD  cetirizine (ZYRTEC) 10 MG tablet TAKE 1 TABLET BY MOUTH ONCE DAILY. 07/03/17  Yes [place], Modena Nunnery, MD  cetirizine (ZYRTEC) 10 MG tablet TAKE 1 TABLET BY MOUTH ONCE DAILY. 08/29/17  Yes [place], Modena Nunnery, MD  diclofenac sodium (VOLTAREN) 1 % GEL APPLY 2 GRAMS TO THE AFFECTED AREAS FOUR TIMES A DAY AS DIRECTED. 08/29/17  Yes Cha[place], Modena Nunnery, MD  fluticasone [facility]) 50 MCG/ACT nasal spray INHALE 2 SPRAYS IN EACH NOSTRIL TWICE DAILY AS DIRECTED. 08/07/17  Yes [place], Modena Nunnery, MD  gabapentin (NEURONTIN) 400 MG capsule TAKE 1 CAPSULE BY MOUTH THREE TIMES A DAY. 05/26/17  Yes [place], Modena Nunnery, MD  IBU 800 MG tablet TAKE ONE TABLET BY MOUTH 2 TIMES A DAY AS NEEDED FOR PAIN. 08/28/17  Yes [place], Modena Nunnery, MD  lamoTRIgine (LAMICTAL) 100 MG tablet Take 1 tablet (100 mg total) by mouth 2 (two) times daily. 07/03/17  Yes Cloria Spring, MD  [facility] powder APPLY TO THE AFFECTED AREAS FOUR TIMES DAILY AS DIRECTED. 08/21/17  Yes [place], Modena Nunnery, MD  omeprazole (PRILOSEC) 40 MG capsule TAKE (1) CAPSULE BY MOUTH ONCE DAILY FOR ACID REFLUX. 05/26/17  Yes [place], Modena Nunnery, MD  tiZANidine (ZANAFLEX) 4 MG tablet Take 1 tablet (4 mg total) by mouth every 8 (eight) hours as needed for muscle spasms. 11/07/16  Yes  [place], Modena Nunnery, MD  traMADol (ULTRAM) 50 MG tablet TAKE 1 TABLET BY MOUTH EVERY 12 HOURS AS NEEDED. 08/29/17  Yes [place], Modena Nunnery, MD  traZODone (DESYREL) 150 MG tablet Take 2 tablets (300 mg total) by mouth at bedtime. 07/03/17  Yes Cloria Spring, MD  valACYclovir (VALTREX) 1000 MG tablet TAKE (1) TABLET BY MOUTH TWICE DAILY. 05/26/17  Yes [place], Modena Nunnery, MD  fluconazole (DIFLUCAN) 150 MG tablet Take 1 tablet (150 mg total) by mouth once for 1 dose. 09/19/17 09/19/17  Delsa Grana, PA-C  nitrofurantoin,  macrocrystal-monohydrate, (MACROBID) 100 MG capsule Take 1 capsule (100 mg total) by mouth 2 (two) times daily for 5 days. 09/19/17 09/24/17  Delsa Grana, PA-C     Allergies  Allergen Reactions  . Hydrocodone Nausea And Vomiting  . Latex Swelling    Ankle Area  . Ampicillin Hives and Nausea And Vomiting     Family History  Problem Relation Age of Onset  . Hypertension Mother   . Diabetes Mother   . Depression Mother   . Hyperlipidemia Mother   . Fibromyalgia Mother   . Arthritis Mother   . Cancer Father        bone  . Hyperlipidemia Father   . Hypertension Father   . Bipolar disorder Father   . Depression Sister   . Hyperlipidemia Sister   . Fibromyalgia Sister   . Fibromyalgia Sister   . Bipolar disorder Other   . Drug abuse Other   . Alcohol abuse Other   . Hypertension Brother   . Other Brother        shingles; back problems  . Diabetes Paternal Grandmother   . Alzheimer's disease Maternal Grandmother   . Obesity Daughter   . Other Daughter        back problems  . Bipolar disorder Daughter   . Depression Daughter   . Other Daughter        on pain meds  . Colon cancer Neg Hx      Social History   Socioeconomic History  . Marital status: Widowed    Spouse name: Not on file  . Number of children: Not on file  . Years of education: Not on file  . Highest education level: Not on file  Occupational History  . Not on file  Social Needs  . Financial resource strain: Not on file  . Food insecurity:    Worry: Not on file    Inability: Not on file  . Transportation needs:    Medical: Not on file    Non-medical: Not on file  Tobacco Use  . Smoking status: Light Tobacco Smoker    Packs/day: 0.25    Years: 25.00    Pack years: 6.25    Types: Cigarettes  . Smokeless tobacco: Never Used  . Tobacco comment: 2 cigarettes daily  Substance and Sexual Activity  . Alcohol use: Yes    Alcohol/week: 0.0 oz    Comment: beer occasionally  . Drug use: No     Comment: cocaine, clean for 4.5 years as of 12/04/2015  . Sexual activity: Yes    Birth control/protection: Surgical  Lifestyle  . Physical activity:    Days per week: Not on file    Minutes per session: Not on file  . Stress: Not on file  Relationships  . Social connections:    Talks on phone: Not on file    Gets together: Not on file    Attends religious service:  Not on file    Active member of club or organization: Not on file    Attends meetings of clubs or organizations: Not on file    Relationship status: Not on file  . Intimate partner violence:    Fear of current or ex partner: Not on file    Emotionally abused: Not on file    Physically abused: Not on file    Forced sexual activity: Not on file  Other Topics Concern  . Not on file  Social History Narrative  . Not on file     Review of Systems  All other systems reviewed and are negative.      Objective:    Vitals:   09/19/17 1208  BP: 122/78  Pulse: 85  Resp: 16  Temp: 97.7 F (36.5 C)  TempSrc: Oral  SpO2: 94%  Weight: 284 lb (128.8 kg)  Height: 5\' 3"  (1.6 m)      Physical Exam  Constitutional: She is oriented to person, place, and time. She appears well-developed and well-nourished.  Non-toxic appearance. No distress.  Anxious appearing, obese, no acute distress, nontoxic  HENT:  Head: Normocephalic and atraumatic.  Right Ear: External ear normal.  Left Ear: External ear normal.  Nose: Nose normal.  Mouth/Throat: Uvula is midline, oropharynx is clear and moist and mucous membranes are normal.  Eyes: Pupils are equal, round, and reactive to light. Conjunctivae, EOM and lids are normal.  Neck: Normal range of motion and phonation normal. Neck supple. No tracheal deviation present.  Cardiovascular: Normal rate, regular rhythm, normal heart sounds and normal pulses. Exam reveals no gallop and no friction rub.  No murmur heard. Pulses:      Radial pulses are 2+ on the right side, and 2+ on the left  side.       Posterior tibial pulses are 2+ on the right side, and 2+ on the left side.  Pulmonary/Chest: Effort normal and breath sounds normal. No stridor. No respiratory distress. She has no wheezes. She has no rhonchi. She has no rales. She exhibits no tenderness.  Abdominal: Soft. Normal appearance and bowel sounds are normal. She exhibits no distension and no mass. There is no tenderness. There is no rebound and no guarding. No hernia.  No abdominal or CVA tenderness to palpation  Musculoskeletal: Normal range of motion. She exhibits no edema or deformity.  Lymphadenopathy:    She has no cervical adenopathy.  Neurological: She is alert and oriented to person, place, and time. She exhibits normal muscle tone. Gait normal.  Skin: Skin is warm, dry and intact. Capillary refill takes less than 2 seconds. No rash noted. She is not diaphoretic. No pallor.  Psychiatric: She has a normal mood and affect. Her speech is normal and behavior is normal.  Nursing note and vitals reviewed.         Assessment & Plan:      ICD-10-CM   1. Dysuria R30.0 Urinalysis, Routine w reflex microscopic    Urine Culture    Urinalysis showed protein but otherwise was negative, microscopy was significant for Added urine culture because of recurrence.  STD testing and urine culture was negative for her visit on 08/23/2017.  Will cover with Macrobid x5 days, Pyridium, and give additional Diflucan pill to take at the completion of antibiotics.  If she continues to have symptoms patient may need coverage for trichomonas with Flagyl.  Only pertinent testing that was not done with recent visits, and was not found with microscopy.  Delsa Grana, PA-C 09/19/17 12:28 PM

## 2017-09-19 NOTE — Patient Instructions (Addendum)
Will treat with a different antibiotic x 5 days and will do another urine culture and call you with results.   Drink plenty of water.  Practice safe sex with condoms or other barrier methods  Please return if not improving in 1 to 2 weeks.

## 2017-09-20 LAB — URINE CULTURE
MICRO NUMBER:: 90670647
SPECIMEN QUALITY:: ADEQUATE

## 2017-09-21 NOTE — Progress Notes (Signed)
This repeated urine culture was negative.  If you continue to have urinary symptoms we would need to look for other causes, can be vaginal (yeast, atrophy, STD's) or have you see urology for further work up.  You can stop the antibiotic.  Please advise

## 2017-09-27 ENCOUNTER — Ambulatory Visit (INDEPENDENT_AMBULATORY_CARE_PROVIDER_SITE_OTHER): Payer: Medicare Other | Admitting: Psychiatry

## 2017-09-27 ENCOUNTER — Telehealth: Payer: Self-pay | Admitting: *Deleted

## 2017-09-27 ENCOUNTER — Encounter (HOSPITAL_COMMUNITY): Payer: Self-pay | Admitting: Psychiatry

## 2017-09-27 ENCOUNTER — Other Ambulatory Visit: Payer: Self-pay | Admitting: Family Medicine

## 2017-09-27 VITALS — BP 148/88 | HR 79 | Ht 63.0 in | Wt 284.0 lb

## 2017-09-27 DIAGNOSIS — Z62810 Personal history of physical and sexual abuse in childhood: Secondary | ICD-10-CM | POA: Diagnosis not present

## 2017-09-27 DIAGNOSIS — Z818 Family history of other mental and behavioral disorders: Secondary | ICD-10-CM

## 2017-09-27 DIAGNOSIS — Z810 Family history of intellectual disabilities: Secondary | ICD-10-CM | POA: Diagnosis not present

## 2017-09-27 DIAGNOSIS — F4310 Post-traumatic stress disorder, unspecified: Secondary | ICD-10-CM | POA: Diagnosis not present

## 2017-09-27 DIAGNOSIS — Z736 Limitation of activities due to disability: Secondary | ICD-10-CM

## 2017-09-27 DIAGNOSIS — Z811 Family history of alcohol abuse and dependence: Secondary | ICD-10-CM | POA: Diagnosis not present

## 2017-09-27 DIAGNOSIS — Z813 Family history of other psychoactive substance abuse and dependence: Secondary | ICD-10-CM

## 2017-09-27 MED ORDER — PERMETHRIN 5 % EX CREA: TOPICAL_CREAM | CUTANEOUS | 1 refills | Status: DC

## 2017-09-27 MED ORDER — ALPRAZOLAM 1 MG PO TABS: 1.0000 mg | ORAL_TABLET | Freq: Four times a day (QID) | ORAL | 2 refills | Status: DC | PRN

## 2017-09-27 MED ORDER — TRAZODONE HCL 150 MG PO TABS: 300.0000 mg | ORAL_TABLET | Freq: Every day | ORAL | 3 refills | Status: DC

## 2017-09-27 MED ORDER — LAMOTRIGINE 100 MG PO TABS: 100.0000 mg | ORAL_TABLET | Freq: Two times a day (BID) | ORAL | 2 refills | Status: DC
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
## 2017-09-27 NOTE — Telephone Encounter (Signed)
Okay to send permethrin 5% cream, can repeat in 1 week

## 2017-09-27 NOTE — Telephone Encounter (Signed)
Call placed to patient. [facility].   Prescription sent to pharmacy.

## 2017-09-27 NOTE — Telephone Encounter (Signed)
Received call from patient.    Reports that she spent the night with a friend and now has irritation to BLE and the backs of arms. States that her friend did reports that she has bed bugs.   Requested MD to advise on treatment of insect bites to arms and legs.

## 2017-09-28 NOTE — Telephone Encounter (Signed)
Call placed to patient and patient made aware per VM.  

## 2017-09-29 ENCOUNTER — Other Ambulatory Visit: Payer: Self-pay | Admitting: Family Medicine

## 2017-09-29 NOTE — Telephone Encounter (Signed)
Ok to refill??  Last office visit 09/19/2017.  Last refill 08/29/2017. 
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
## 2017-10-20 ENCOUNTER — Other Ambulatory Visit: Payer: Self-pay | Admitting: Family Medicine

## 2017-10-27 ENCOUNTER — Other Ambulatory Visit: Payer: Self-pay | Admitting: Family Medicine

## 2017-10-30 NOTE — Telephone Encounter (Signed)
Ok to refill??  Last office visit 08/23/2017.  Last refill 09/29/2017.

## 2017-11-06 DIAGNOSIS — M170 Bilateral primary osteoarthritis of knee: Secondary | ICD-10-CM | POA: Diagnosis not present

## 2017-11-21 ENCOUNTER — Other Ambulatory Visit: Payer: Self-pay | Admitting: Family Medicine

## 2017-11-21 NOTE — Telephone Encounter (Signed)
Ok to refill??  Last office visit 08/23/2017.  Last refill 10/30/2017.

## 2017-11-25 ENCOUNTER — Other Ambulatory Visit: Payer: Self-pay | Admitting: Family Medicine

## 2017-11-28 ENCOUNTER — Other Ambulatory Visit (HOSPITAL_COMMUNITY): Payer: Self-pay | Admitting: Psychiatry

## 2017-12-12 ENCOUNTER — Encounter: Payer: Self-pay | Admitting: Family Medicine

## 2017-12-21 ENCOUNTER — Encounter: Payer: Self-pay | Admitting: Family Medicine

## 2017-12-22 ENCOUNTER — Other Ambulatory Visit (HOSPITAL_COMMUNITY): Payer: Self-pay | Admitting: Psychiatry

## 2017-12-24 ENCOUNTER — Other Ambulatory Visit: Payer: Self-pay

## 2017-12-24 ENCOUNTER — Emergency Department (HOSPITAL_COMMUNITY)
Admission: EM | Admit: 2017-12-24 | Discharge: 2017-12-24 | Disposition: A | Discharge status: Home / Self Care | Payer: Medicare Other | Attending: Emergency Medicine | Admitting: Emergency Medicine

## 2017-12-24 ENCOUNTER — Emergency Department (HOSPITAL_COMMUNITY): Payer: Medicare Other

## 2017-12-24 ENCOUNTER — Encounter (HOSPITAL_COMMUNITY): Payer: Self-pay | Admitting: Emergency Medicine

## 2017-12-24 DIAGNOSIS — R1030 Lower abdominal pain, unspecified: Secondary | ICD-10-CM | POA: Diagnosis not present

## 2017-12-24 DIAGNOSIS — R300 Dysuria: Secondary | ICD-10-CM | POA: Diagnosis not present

## 2017-12-24 DIAGNOSIS — F17210 Nicotine dependence, cigarettes, uncomplicated: Secondary | ICD-10-CM | POA: Diagnosis not present

## 2017-12-24 DIAGNOSIS — F319 Bipolar disorder, unspecified: Secondary | ICD-10-CM | POA: Insufficient documentation

## 2017-12-24 DIAGNOSIS — K5730 Diverticulosis of large intestine without perforation or abscess without bleeding: Secondary | ICD-10-CM | POA: Diagnosis not present

## 2017-12-24 DIAGNOSIS — Z91040 Latex allergy status: Secondary | ICD-10-CM | POA: Insufficient documentation

## 2017-12-24 DIAGNOSIS — R109 Unspecified abdominal pain: Secondary | ICD-10-CM | POA: Diagnosis not present

## 2017-12-24 DIAGNOSIS — Z79899 Other long term (current) drug therapy: Secondary | ICD-10-CM | POA: Insufficient documentation

## 2017-12-24 LAB — CBC WITH DIFFERENTIAL/PLATELET
BASOS ABS: 0 10*3/uL (ref 0.0–0.1)
Basophils Relative: 0 %
Eosinophils Absolute: 0.1 10*3/uL (ref 0.0–0.7)
Eosinophils Relative: 2 %
HCT: 40.9 % (ref 36.0–46.0)
HEMOGLOBIN: 13 g/dL (ref 12.0–15.0)
LYMPHS ABS: 1.5 10*3/uL (ref 0.7–4.0)
LYMPHS PCT: 33 %
MCH: 30.7 pg (ref 26.0–34.0)
MCHC: 31.8 g/dL (ref 30.0–36.0)
MCV: 96.7 fL (ref 78.0–100.0)
Monocytes Absolute: 0.4 10*3/uL (ref 0.1–1.0)
Monocytes Relative: 8 %
NEUTROS ABS: 2.6 10*3/uL (ref 1.7–7.7)
NEUTROS PCT: 57 %
Platelets: 262 10*3/uL (ref 150–400)
RBC: 4.23 MIL/uL (ref 3.87–5.11)
RDW: 14 % (ref 11.5–15.5)
WBC: 4.6 10*3/uL (ref 4.0–10.5)

## 2017-12-24 LAB — URINALYSIS, ROUTINE W REFLEX MICROSCOPIC
Bilirubin Urine: NEGATIVE
GLUCOSE, UA: NEGATIVE mg/dL
HGB URINE DIPSTICK: NEGATIVE
Ketones, ur: NEGATIVE mg/dL
LEUKOCYTES UA: NEGATIVE
Nitrite: NEGATIVE
PROTEIN: NEGATIVE mg/dL
SPECIFIC GRAVITY, URINE: 1.021 (ref 1.005–1.030)
pH: 5 (ref 5.0–8.0)

## 2017-12-24 LAB — BASIC METABOLIC PANEL
ANION GAP: 6 (ref 5–15)
BUN: 10 mg/dL (ref 6–20)
CHLORIDE: 107 mmol/L (ref 98–111)
CO2: 29 mmol/L (ref 22–32)
Calcium: 8.6 mg/dL — ABNORMAL LOW (ref 8.9–10.3)
Creatinine, Ser: 0.68 mg/dL (ref 0.44–1.00)
Glucose, Bld: 99 mg/dL (ref 70–99)
POTASSIUM: 3.5 mmol/L (ref 3.5–5.1)
Sodium: 142 mmol/L (ref 135–145)

## 2017-12-24 MED ORDER — ONDANSETRON HCL 4 MG PO TABS: 4.0000 mg | ORAL_TABLET | Freq: Four times a day (QID) | ORAL | 0 refills | Status: DC

## 2017-12-24 MED ORDER — SODIUM CHLORIDE 0.9 % IV BOLUS: 500.0000 mL | Freq: Once | INTRAVENOUS | Status: DC

## 2017-12-24 MED ORDER — IOPAMIDOL (ISOVUE-300) INJECTION 61%
100.0000 mL | Freq: Once | INTRAVENOUS | Status: AC | PRN
  Administered 2017-12-24: 100 mL via INTRAVENOUS

## 2017-12-24 NOTE — ED Triage Notes (Signed)
Pt states she feels like she has a urinary tract infection.  States has urinary frequency, hesitancy, and lower abd pain x 3 months.  Went to pcp and states she could not urinate so she put water in her sample and thinks it "messed up" results.  Pt also fell in the hallway because her shoe caught on the floor.  Denies complaints from this.

## 2017-12-24 NOTE — ED Provider Notes (Signed)
[facility] EMERGENCY DEPARTMENT Provider Note   CSN: [id number] Arrival date & time: 12/24/17  6384     History   Chief Complaint Chief Complaint  Patient presents with  . Dysuria    HPI Annette Galvan is a 57 y.o. female.  HPI   Annette Galvan is a 57 y.o. female who presents to the Emergency Department complaining of recurrent dysuria.  States she was treated for a UTI x 2 several months ago and she feels like her symptoms never improved but also admits that she didn't take the antibiotics as prescribed.  She complains of urinary frequency, pressure and only urinating small amounts.  Symptoms have been gradually worsening for several days and associated with generalized fatigue and nausea.  She denies fever, vomiting, flank pain, hematuria.  It was noted that patient also tripped and fell walking up the hallway into the emergency department she fell on both knees, but denies injuries.   Past Medical History:  Diagnosis Date  . Arthritis    needs 2 knee replacements  . Back pain, chronic   . Bilateral chronic knee pain   . Bipolar 1 disorder ([place])   . Bulging disc   . Fibromyalgia   . GERD (gastroesophageal reflux disease)   . HSV-2 (herpes simplex virus 2) infection 2013  . Hx of trichomoniasis   . Itching 06/26/2013  . Leg pain   . Trichimoniasis 06/26/2013  . UTI (lower urinary tract infection) 10/12/2012  . Vaginal discharge 06/26/2013  . Vaginal irritation 12/05/2013  . Vaginal odor 12/28/2012   Had discharge +clue  . Yeast infection 02/04/2013    Patient Active Problem List   Diagnosis Date Noted  . Candidal intertrigo 01/09/2017  . Cellulitis 01/08/2016  . Polypharmacy 11/13/2015  . Subclinical hyperthyroidism 08/14/2015  . GERD (gastroesophageal reflux disease) 11/12/2014  . Breast asymmetry between native breast and reconstructed breast 05/26/2014  . Thoracic back pain 05/26/2014  . Inversion deformity of right foot 05/26/2014  . Trichimoniasis 06/26/2013    . Itching 06/26/2013  . OA (osteoarthritis) of knee 05/17/2013  . Acute sinusitis 05/17/2013  . Encounter for screening colonoscopy 12/26/2012  . Stye 09/13/2012  . Sleep apnea 09/13/2012  . Tobacco use 09/13/2012  . Elevated blood pressure (not hypertension) 02/10/2012  . Fibromyalgia 10/11/2011  . Morbid obesity ([place]) 08/29/2011  . Borderline diabetic 08/29/2011  . Bipolar 1 disorder ([place]) 08/29/2011  . Difficulty in walking(719.7) 05/10/2011  . DDD (degenerative disc disease), lumbar 04/04/2007    Past Surgical History:  Procedure Laterality Date  . ABDOMINAL HYSTERECTOMY    . BREAST ENHANCEMENT SURGERY Right   . COLONOSCOPY WITH PROPOFOL N/A 12/07/2015   Procedure: COLONOSCOPY WITH PROPOFOL;  Surgeon: Daneil Dolin, MD;  [facility]: [facility];  Service: Endoscopy;  Laterality: N/A;  915  . TUBAL LIGATION       OB History    Gravida  2   Para  2   Term      Preterm      AB      Living  2     SAB      TAB      Ectopic      Multiple      Live Births  2            Home Medications    Prior to Admission medications   Medication Sig Start Date End Date Taking? Authorizing Provider  ALPRAZolam Duanne Moron) 1 MG tablet TAKE (1) TABLET  BY MOUTH (4) TIMES DAILY AS NEEDED FOR ANXIETY. 11/28/17   Cloria Spring, MD  cetirizine (ZYRTEC) 10 MG tablet TAKE 1 TABLET BY MOUTH ONCE DAILY. 07/03/17   Alycia Rossetti, MD  cetirizine (ZYRTEC) 10 MG tablet TAKE 1 TABLET BY MOUTH ONCE DAILY. 08/29/17   [place], Modena Nunnery, MD  diclofenac sodium (VOLTAREN) 1 % GEL APPLY 2 GRAMS TO THE AFFECTED AREAS FOUR TIMES A DAY AS DIRECTED. 09/27/17   Alycia Rossetti, MD  fluticasone [facility]) 50 MCG/ACT nasal spray INHALE 2 SPRAYS IN EACH NOSTRIL TWICE DAILY AS DIRECTED. 09/27/17   Alycia Rossetti, MD  fluticasone (FLONASE) 50 MCG/ACT nasal spray INHALE 2 SPRAYS IN EACH NOSTRIL TWICE DAILY AS DIRECTED. 11/27/17   Alycia Rossetti, MD  gabapentin (NEURONTIN) 400 MG capsule TAKE 1  CAPSULE BY MOUTH THREE TIMES A DAY. 05/26/17   Alycia Rossetti, MD  IBU 800 MG tablet TAKE ONE TABLET BY MOUTH 2 TIMES A DAY AS NEEDED FOR PAIN. 11/27/17   Alycia Rossetti, MD  lamoTRIgine (LAMICTAL) 100 MG tablet Take 1 tablet (100 mg total) by mouth 2 (two) times daily. 09/27/17   Cloria Spring, MD  [facility] powder APPLY TO THE AFFECTED AREAS FOUR TIMES DAILY AS DIRECTED. 08/21/17   Alycia Rossetti, MD  omeprazole (PRILOSEC) 40 MG capsule TAKE (1) CAPSULE BY MOUTH ONCE DAILY FOR ACID REFLUX. 05/26/17   Alycia Rossetti, MD  permethrin (ELIMITE) 5 % cream Apply thin layer from neck to toes, including the soles of feet. Leave on overnight. Rinse off in AM. May repeat in 1 week. 09/27/17   [place], Modena Nunnery, MD  phenazopyridine (PYRIDIUM) 200 MG tablet Take 1 tablet (200 mg total) by mouth 3 (three) times daily as needed for pain. 09/19/17   Delsa Grana, PA-C  tiZANidine (ZANAFLEX) 4 MG tablet Take 1 tablet (4 mg total) by mouth every 8 (eight) hours as needed for muscle spasms. 11/07/16   [place], Modena Nunnery, MD  traMADol (ULTRAM) 50 MG tablet TAKE 1 TABLET BY MOUTH EVERY 12 HOURS AS NEEDED. 11/21/17   Alycia Rossetti, MD  traZODone (DESYREL) 150 MG tablet Take 2 tablets (300 mg total) by mouth at bedtime. 09/27/17   Cloria Spring, MD  traZODone (DESYREL) 150 MG tablet TAKE 2 TABLETS BY MOUTH AT BEDTIME. 12/22/17   Cloria Spring, MD  valACYclovir (VALTREX) 1000 MG tablet TAKE (1) TABLET BY MOUTH TWICE DAILY. 05/26/17   Alycia Rossetti, MD    Family History Family History  Problem Relation Age of Onset  . Hypertension Mother   . Diabetes Mother   . Depression Mother   . Hyperlipidemia Mother   . Fibromyalgia Mother   . Arthritis Mother   . Cancer Father        bone  . Hyperlipidemia Father   . Hypertension Father   . Bipolar disorder Father   . Depression Sister   . Hyperlipidemia Sister   . Fibromyalgia Sister   . Fibromyalgia Sister   . Bipolar disorder Other   . Drug abuse Other   .  Alcohol abuse Other   . Hypertension Brother   . Other Brother        shingles; back problems  . Diabetes Paternal Grandmother   . Alzheimer's disease Maternal Grandmother   . Obesity Daughter   . Other Daughter        back problems  . Bipolar disorder Daughter   . Depression Daughter   .  Other Daughter        on pain meds  . Colon cancer Neg Hx     Social History Social History   Tobacco Use  . Smoking status: Light Tobacco Smoker    Packs/day: 0.25    Years: 25.00    Pack years: 6.25    Types: Cigarettes  . Smokeless tobacco: Never Used  . Tobacco comment: 2 cigarettes daily  Substance Use Topics  . Alcohol use: Yes    Alcohol/week: 0.0 standard drinks    Comment: occasional  . Drug use: No    Comment: cocaine, clean for 4.5 years as of 12/04/2015     Allergies   Hydrocodone; Latex; and Ampicillin   Review of Systems Review of Systems  Constitutional: Negative for activity change, appetite change, chills and fever.  Respiratory: Negative for chest tightness and shortness of breath.   Cardiovascular: Negative for chest pain.  Gastrointestinal: Positive for abdominal pain and nausea. Negative for vomiting.  Genitourinary: Positive for difficulty urinating, dysuria and frequency. Negative for decreased urine volume, flank pain, hematuria, urgency, vaginal bleeding and vaginal discharge.  Musculoskeletal: Negative for arthralgias and back pain.  Skin: Negative for rash.  Neurological: Negative for dizziness, weakness and numbness.  Hematological: Negative for adenopathy.  Psychiatric/Behavioral: Negative for confusion.     Physical Exam Updated Vital Signs BP (!) 151/75 (BP Location: Left Arm)   Pulse 79   Temp 98 F (36.7 C) (Oral)   Resp (!) 22   Ht 5\' 4"  (1.626 m)   Wt 125.2 kg   SpO2 95%   BMI 47.38 kg/m   Physical Exam  Constitutional: She is oriented to person, place, and time. She appears well-developed and well-nourished. No distress.  HENT:    Head: Normocephalic and atraumatic.  Mucous membranes are dry  Cardiovascular: Normal rate, regular rhythm and intact distal pulses.  No murmur heard. Pulmonary/Chest: Effort normal and breath sounds normal. No respiratory distress. She has no wheezes. She has no rales.  Abdominal: Soft. Normal appearance. She exhibits no distension and no mass. There is no hepatosplenomegaly. There is tenderness in the suprapubic area. There is no rigidity, no rebound, no guarding, no CVA tenderness and no tenderness at McBurney's point.  Mild ttp of the suprapubic region.  Remaining abdomen is soft, non-tender without guarding or rebound tenderness. No CVA tenderness  Musculoskeletal: Normal range of motion. She exhibits no edema.  Nontender, full range of motion of bilateral knees.  No edema or ecchymosis.  Neurological: She is alert and oriented to person, place, and time. No sensory deficit. Coordination normal.  Skin: Skin is warm and dry. Capillary refill takes less than 2 seconds. No rash noted.  Psychiatric: She has a normal mood and affect.  Nursing note and vitals reviewed.    ED Treatments / Results  Labs (all labs ordered are listed, but only abnormal results are displayed) Labs Reviewed  URINE CULTURE - Abnormal; Notable for the following components:      Result Value   Culture MULTIPLE SPECIES PRESENT, SUGGEST RECOLLECTION (*)    All other components within normal limits  BASIC METABOLIC PANEL - Abnormal; Notable for the following components:   Calcium 8.6 (*)    All other components within normal limits  URINALYSIS, ROUTINE W REFLEX MICROSCOPIC - Abnormal; Notable for the following components:   APPearance CLOUDY (*)    All other components within normal limits  CBC WITH DIFFERENTIAL/PLATELET    EKG None  Radiology Ct Abdomen Pelvis W  Contrast  Result Date: 12/24/2017 CLINICAL DATA:  Abdominal pain and urinary frequency. EXAM: CT ABDOMEN AND PELVIS WITH CONTRAST TECHNIQUE:  Multidetector CT imaging of the abdomen and pelvis was performed using the standard protocol following bolus administration of intravenous contrast. CONTRAST:  183mL ISOVUE-300 IOPAMIDOL (ISOVUE-300) INJECTION 61% COMPARISON:  Aug 25, 2016 FINDINGS: Lower chest: Suspected right implant rupture. Lower chest/lung bases otherwise normal. Hepatobiliary: Probably tiny right hepatic cyst on series 2, image 19. No suspicious liver masses. The portal vein appears patent. The gallbladder is unremarkable. Pancreas: There is fatty deposition within the pancreatic head, similar in the interval. Spleen: Normal in size without focal abnormality. Adrenals/Urinary Tract: The adrenal glands are normal. Probable tiny cyst off the right kidney on coronal image 52, too small to characterize. No hydronephrosis or significant perinephric stranding. No stones. The ureters and bladder are normal. Stomach/Bowel: Stomach is normal. There is a duodenal diverticulum which demonstrates no evidence of inflammation. The remainder of the small bowel is normal. Colonic diverticulosis is seen in the sigmoid colon primarily without diverticulitis. The appendix is normal. Vascular/Lymphatic: No significant vascular findings are present. No enlarged abdominal or pelvic lymph nodes. Reproductive: Status post hysterectomy. No adnexal masses. Other: No abdominal wall hernia or abnormality. No abdominopelvic ascites. Musculoskeletal: No acute or significant osseous findings. IMPRESSION: 1. Suspected right breast implant rupture, incompletely evaluated. 2. No renal stones or obstruction. No cause for symptoms identified in the kidneys, ureters, or bladder. 3. Colonic diverticuli without diverticulitis. 4. No other abnormalities. Electronically Signed   By: Dorise Bullion III M.D   On: 12/24/2017 12:15    Procedures Procedures (including critical care time)  Medications Ordered in ED Medications - No data to display   Initial Impression /  Assessment and Plan / ED Course  I have reviewed the triage vital signs and the nursing notes.  Pertinent labs & imaging results that were available during my care of the patient were reviewed by me and considered in my medical decision making (see chart for details).     Pt well appearing, non toxic.  Vitals reviewed.  Female, post hysterectomy, with lower abdominal pain and dysuria.  Work up reassuring.  Urinalysis does not indicate UTI, will culture. After discussing results, pt reports feeling better and she is requesting d/c home.  Appears appropriate for d/c, she agrees to PCP f/u.  Return precautions discussed.    Final Clinical Impressions(s) / ED Diagnoses   Final diagnoses:  Dysuria    ED Discharge Orders    None       Kem Parkinson, PA-C 12/25/17 1949    Francine Graven, DO 12/26/17 1014

## 2017-12-24 NOTE — Discharge Instructions (Addendum)
It is important that you drink plenty of fluids.  Follow-up with your primary doctor for recheck.

## 2017-12-25 LAB — URINE CULTURE

## 2017-12-26 ENCOUNTER — Encounter (HOSPITAL_COMMUNITY): Payer: Self-pay | Admitting: Psychiatry

## 2017-12-26 ENCOUNTER — Ambulatory Visit (INDEPENDENT_AMBULATORY_CARE_PROVIDER_SITE_OTHER): Payer: Medicare Other | Admitting: Psychiatry

## 2017-12-26 ENCOUNTER — Encounter: Payer: Self-pay | Admitting: Family Medicine

## 2017-12-26 VITALS — BP 166/84 | HR 76 | Ht 64.0 in | Wt 280.0 lb

## 2017-12-26 DIAGNOSIS — Z736 Limitation of activities due to disability: Secondary | ICD-10-CM

## 2017-12-26 DIAGNOSIS — F4310 Post-traumatic stress disorder, unspecified: Secondary | ICD-10-CM

## 2017-12-26 DIAGNOSIS — Z811 Family history of alcohol abuse and dependence: Secondary | ICD-10-CM

## 2017-12-26 DIAGNOSIS — F17210 Nicotine dependence, cigarettes, uncomplicated: Secondary | ICD-10-CM

## 2017-12-26 DIAGNOSIS — Z818 Family history of other mental and behavioral disorders: Secondary | ICD-10-CM

## 2017-12-26 DIAGNOSIS — Z62810 Personal history of physical and sexual abuse in childhood: Secondary | ICD-10-CM

## 2017-12-26 DIAGNOSIS — Z813 Family history of other psychoactive substance abuse and dependence: Secondary | ICD-10-CM

## 2017-12-26 MED ORDER — TRAZODONE HCL 150 MG PO TABS: 300.0000 mg | ORAL_TABLET | Freq: Every day | ORAL | 3 refills | Status: DC

## 2017-12-26 MED ORDER — ALPRAZOLAM 1 MG PO TABS: ORAL_TABLET | ORAL | 0 refills | Status: DC

## 2017-12-26 MED ORDER — LAMOTRIGINE 100 MG PO TABS: 100.0000 mg | ORAL_TABLET | Freq: Two times a day (BID) | ORAL | 2 refills | Status: DC
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
## 2017-12-28 ENCOUNTER — Encounter: Payer: Self-pay | Admitting: Family Medicine

## 2017-12-31 ENCOUNTER — Other Ambulatory Visit: Payer: Self-pay | Admitting: Family Medicine

## 2018-01-01 ENCOUNTER — Other Ambulatory Visit: Payer: Self-pay | Admitting: Family Medicine

## 2018-01-01 NOTE — Telephone Encounter (Signed)
Ok to refill??  Last office visit 08/23/2017.  Last refill 11/21/2017.

## 2018-01-09 ENCOUNTER — Encounter: Payer: Self-pay | Admitting: Family Medicine

## 2018-01-10 ENCOUNTER — Other Ambulatory Visit: Payer: Self-pay | Admitting: Family Medicine

## 2018-01-11 ENCOUNTER — Ambulatory Visit (HOSPITAL_COMMUNITY): Payer: Medicare Other | Admitting: Psychiatry

## 2018-01-25 ENCOUNTER — Ambulatory Visit (INDEPENDENT_AMBULATORY_CARE_PROVIDER_SITE_OTHER): Payer: Medicare Other | Admitting: Family Medicine

## 2018-01-25 ENCOUNTER — Encounter: Payer: Self-pay | Admitting: Family Medicine

## 2018-01-25 ENCOUNTER — Other Ambulatory Visit: Payer: Self-pay

## 2018-01-25 VITALS — BP 132/68 | HR 82 | Temp 98.1°F | Resp 16 | Ht 63.0 in | Wt 283.0 lb

## 2018-01-25 DIAGNOSIS — Z23 Encounter for immunization: Secondary | ICD-10-CM | POA: Diagnosis not present

## 2018-01-25 DIAGNOSIS — R7303 Prediabetes: Secondary | ICD-10-CM | POA: Diagnosis not present

## 2018-01-25 DIAGNOSIS — Z720 Tobacco use: Secondary | ICD-10-CM | POA: Diagnosis not present

## 2018-01-25 DIAGNOSIS — Z1239 Encounter for other screening for malignant neoplasm of breast: Secondary | ICD-10-CM | POA: Diagnosis not present

## 2018-01-25 DIAGNOSIS — Z0000 Encounter for general adult medical examination without abnormal findings: Secondary | ICD-10-CM

## 2018-01-25 NOTE — Patient Instructions (Signed)
  Annette Galvan , Thank you for taking time to come for your Medicare Wellness Visit. I appreciate your ongoing commitment to your health goals. Please review the following plan we discussed and let me know if I can assist you in the future.   These are the goals we discussed: Try to work on lowering overall calories per day to help loose weight to help pain with knees  Goals   None     This is a list of the screening recommended for you and due dates:  Health Maintenance  Topic Date Due  . Mammogram  04/24/2011  . Flu Shot  11/16/2017  . Tetanus Vaccine  11/11/2024  . Colon Cancer Screening  12/06/2025  .  Hepatitis C: One time screening is recommended by Center for Disease Control  (CDC) for  adults born from [birth year] through 1964.   Completed  . HIV Screening  Completed

## 2018-01-25 NOTE — Progress Notes (Signed)
Subjective:   Annette Galvan is a 57 y.o. female who presents for Medicare Annual (Subsequent) preventive examination.  Review of Systems:  Review of Systems  Constitutional: Negative.  Negative for activity change, appetite change, chills, diaphoresis, fatigue, fever and unexpected weight change.  HENT: Negative.   Eyes: Negative.   Respiratory: Negative.   Cardiovascular: Negative.   Gastrointestinal: Negative.   Endocrine: Negative.   Genitourinary: Negative.   Musculoskeletal: Positive for arthralgias (chronic).  Skin: Negative.   Allergic/Immunologic: Negative.   Neurological: Negative.   Hematological: Negative.   Psychiatric/Behavioral: Negative.  Negative for sleep disturbance and suicidal ideas.  All other systems reviewed and are negative.  Cardiac Risk Factors include: obesity (BMI >30kg/m2);sedentary lifestyle;smoking/ tobacco exposure     Objective:     Vitals: BP 132/68   Pulse 82   Temp 98.1 F (36.7 C) (Oral)   Resp 16   Ht 5\' 3"  (1.6 m)   Wt 283 lb (128.4 kg)   SpO2 98%   BMI 50.13 kg/m   Body mass index is 50.13 kg/m.  Advanced Directives 01/25/2018 12/24/2017 11/05/2016 08/24/2016 01/18/2016 01/08/2016 01/08/2016  Does Patient Have a Medical Advance Directive? No No No No No No No  Would patient like information on creating a medical advance directive? No - Patient declined No - Patient declined - - No - patient declined information No - patient declined information No - patient declined information    Tobacco Social History   Tobacco Use  Smoking Status Light Tobacco Smoker  . Packs/day: 0.25  . Years: 25.00  . Pack years: 6.25  . Types: Cigarettes  Smokeless Tobacco Never Used  Tobacco Comment   2 cigarettes daily     Ready to quit: Not Answered Counseling given: Not Answered Comment: 2 cigarettes daily   Clinical Intake:  Pre-visit preparation completed: Yes  Pain : 0-10 Pain Score: 3  Pain Type: Chronic pain Pain Location: Other  (Comment)(fibromyalgia) Pain Onset: More than a month ago Pain Frequency: Several days a week     BMI - recorded: 50 Nutritional Status: BMI > 30  Obese Nutritional Risks: None Diabetes: No  How often do you need to have someone help you when you read instructions, pamphlets, or other written materials from your doctor or pharmacy?: 1 - Never What is the last grade level you completed in school?: 11th  Interpreter Needed?: No  Information entered by :: [employer], LPN  Past Medical History:  Diagnosis Date  . Arthritis    needs 2 knee replacements  . Back pain, chronic   . Bilateral chronic knee pain   . Bipolar 1 disorder ([place])   . Bulging disc   . Fibromyalgia   . GERD (gastroesophageal reflux disease)   . HSV-2 (herpes simplex virus 2) infection 2013  . Hx of trichomoniasis   . Itching 06/26/2013  . Leg pain   . Trichimoniasis 06/26/2013  . UTI (lower urinary tract infection) 10/12/2012  . Vaginal discharge 06/26/2013  . Vaginal irritation 12/05/2013  . Vaginal odor 12/28/2012   Had discharge +clue  . Yeast infection 02/04/2013   Past Surgical History:  Procedure Laterality Date  . ABDOMINAL HYSTERECTOMY    . BREAST ENHANCEMENT SURGERY Right   . COLONOSCOPY WITH PROPOFOL N/A 12/07/2015   Procedure: COLONOSCOPY WITH PROPOFOL;  Surgeon: Daneil Dolin, MD;  [facility];  Service: Endoscopy;  Laterality: N/A;  915  . TUBAL LIGATION     Family History  Problem Relation Age  of Onset  . Hypertension Mother   . Diabetes Mother   . Depression Mother   . Hyperlipidemia Mother   . Fibromyalgia Mother   . Arthritis Mother   . Cancer Father        bone  . Hyperlipidemia Father   . Hypertension Father   . Bipolar disorder Father   . Depression Sister   . Hyperlipidemia Sister   . Fibromyalgia Sister   . Fibromyalgia Sister   . Bipolar disorder Other   . Drug abuse Other   . Alcohol abuse Other   . Hypertension Brother   . Other Brother        shingles; back  problems  . Diabetes Paternal Grandmother   . Alzheimer's disease Maternal Grandmother   . Obesity Daughter   . Other Daughter        back problems  . Bipolar disorder Daughter   . Depression Daughter   . Other Daughter        on pain meds  . Colon cancer Neg Hx    Social History   Socioeconomic History  . Marital status: Widowed    Spouse name: Not on file  . Number of children: Not on file  . Years of education: Not on file  . Highest education level: Not on file  Occupational History  . Not on file  Social Needs  . Financial resource strain: Not on file  . Food insecurity:    Worry: Not on file    Inability: Not on file  . Transportation needs:    Medical: Not on file    Non-medical: Not on file  Tobacco Use  . Smoking status: Light Tobacco Smoker    Packs/day: 0.25    Years: 25.00    Pack years: 6.25    Types: Cigarettes  . Smokeless tobacco: Never Used  . Tobacco comment: 2 cigarettes daily  Substance and Sexual Activity  . Alcohol use: Yes    Alcohol/week: 0.0 standard drinks    Comment: occasional  . Drug use: No    Comment: cocaine, clean for 4.5 years as of 12/04/2015  . Sexual activity: Yes    Birth control/protection: Surgical  Lifestyle  . Physical activity:    Days per week: Not on file    Minutes per session: Not on file  . Stress: Not on file  Relationships  . Social connections:    Talks on phone: Not on file    Gets together: Not on file    Attends religious service: Not on file    Active member of club or organization: Not on file    Attends meetings of clubs or organizations: Not on file    Relationship status: Not on file  Other Topics Concern  . Not on file  Social History Narrative  . Not on file    Outpatient Encounter Medications as of 01/25/2018  Medication Sig  . ALPRAZolam (XANAX) 1 MG tablet TAKE (1) TABLET BY MOUTH (4) TIMES DAILY AS NEEDED FOR ANXIETY.  . cetirizine (ZYRTEC) 10 MG tablet TAKE 1 TABLET BY MOUTH ONCE  DAILY.  Marland Kitchen diclofenac sodium (VOLTAREN) 1 % GEL APPLY 2 GRAMS TO THE AFFECTED AREAS FOUR TIMES A DAY AS DIRECTED.  Marland Kitchen diphenhydramine-acetaminophen (ACETAMINOPHEN PM) 25-500 MG TABS tablet Take 4 tablets by mouth at bedtime as needed.  . fluticasone (FLONASE) 50 MCG/ACT nasal spray INHALE 2 SPRAYS IN EACH NOSTRIL TWICE DAILY AS DIRECTED.  . fluticasone (FLONASE) 50 MCG/ACT nasal spray INHALE 2  SPRAYS IN EACH NOSTRIL TWICE DAILY AS DIRECTED.  Marland Kitchen gabapentin (NEURONTIN) 400 MG capsule TAKE 1 CAPSULE BY MOUTH THREE TIMES A DAY.  . IBU 800 MG tablet TAKE ONE TABLET BY MOUTH 2 TIMES A DAY AS NEEDED FOR PAIN.  Marland Kitchen lamoTRIgine (LAMICTAL) 100 MG tablet Take 1 tablet (100 mg total) by mouth 2 (two) times daily.  Marland Kitchen NYAMYC powder APPLY TO THE AFFECTED AREAS FOUR TIMES DAILY AS DIRECTED.  Marland Kitchen omeprazole (PRILOSEC) 40 MG capsule TAKE (1) CAPSULE BY MOUTH ONCE DAILY FOR ACID REFLUX.  Marland Kitchen ondansetron (ZOFRAN) 4 MG tablet Take 1 tablet (4 mg total) by mouth every 6 (six) hours.  . traMADol (ULTRAM) 50 MG tablet TAKE 1 TABLET BY MOUTH EVERY 12 HOURS AS NEEDED.  Marland Kitchen traZODone (DESYREL) 150 MG tablet Take 2 tablets (300 mg total) by mouth at bedtime.  . valACYclovir (VALTREX) 1000 MG tablet TAKE (1) TABLET BY MOUTH TWICE DAILY.   No facility-administered encounter medications on file as of 01/25/2018.     Activities of Daily Living In your present state of health, do you have any difficulty performing the following activities: 01/25/2018  Hearing? Y  Vision? Y  Difficulty concentrating or making decisions? Y  Walking or climbing stairs? Y  Dressing or bathing? N  Doing errands, shopping? Y  Preparing Food and eating ? Y  Using the Toilet? N  In the past six months, have you accidently leaked urine? N  Do you have problems with loss of bowel control? N  Managing your Medications? N  Managing your Finances? N  Housekeeping or managing your Housekeeping? Y  Some recent data might be hidden    Patient Care  Team: [facility], [place] Nunnery, MD as PCP - General (Family Medicine) Gala Romney Cristopher Estimable, MD as Consulting Physician (Gastroenterology)    Assessment:   This is a routine wellness examination for Deania.  Exercise Activities and Dietary recommendations Current Exercise Habits: The patient does not participate in regular exercise at present, Exercise limited by: psychological condition(s)  Goals   None     Fall Risk Fall Risk  01/25/2018 11/16/2016 11/16/2016 09/15/2016 08/12/2016  Falls in the past year? No No No No No  Number falls in past yr: - - - - -  Injury with Fall? - - - - -  Follow up - - - - -   Is the patient's home free of loose throw rugs in walkways, pet beds, electrical cords, etc?   no      Grab bars in the bathroom? no      Handrails on the stairs?   no      Adequate lighting?   yes   Depression Screen PHQ 2/9 Scores 01/25/2018 01/09/2017 11/16/2016 11/16/2016  PHQ - 2 Score 2 3 0 0  PHQ- 9 Score 11 6 - -   Dr. Harrington Challenger  Cognitive Function Cognitive Testing   Alert? Yes  Normal Appearance?Yes  Oriented to person? Yes  Place? Yes  Time? Yes  Recall of three objects? Yes  Can perform simple calculations? Yes  Displays appropriate judgment?Yes  Can read the correct time from a watch face?Yes          Immunization History  Administered Date(s) Administered  . Influenza Split 04/23/2012  . Influenza,inj,Quad PF,6+ Mos 01/09/2017  . Influenza,trivalent, recombinat, inj, PF 01/28/2013  . Influenza-Unspecified 05/08/2017  . Pneumococcal Polysaccharide-23 05/26/2014  . Tdap 11/12/2014    Qualifies for Shingles Vaccine? Pt refuses  Screening Tests Health Maintenance  Topic Date Due  . MAMMOGRAM  04/24/2011  . INFLUENZA VACCINE  11/16/2017  . TETANUS/TDAP  11/11/2024  . COLONOSCOPY  12/06/2025  . Hepatitis C Screening  Completed  . HIV Screening  Completed    Cancer Screenings: Lung: Low Dose CT Chest recommended if Age 31-80 years, 30 pack-year currently  smoking OR have quit w/in 15years. Patient does not qualify. Breast:  Up to date on Mammogram? No   Up to date of Bone Density/Dexa? No Colorectal: UTD   Additional Screenings: Hepatitis C Screening:  Done see above     Plan:   Problem List Items Addressed This Visit      Other   Morbid obesity ([place])   Relevant Orders   Lipid Panel   COMPLETE METABOLIC PANEL WITH GFR   Hemoglobin A1c   Borderline diabetic   Relevant Orders   Lipid Panel   COMPLETE METABOLIC PANEL WITH GFR   Hemoglobin A1c   Tobacco use    Other Visit Diagnoses    General medical exam    -  Primary   Screening for malignant neoplasm of breast       Relevant Orders   MM Digital Screening     Patient does not want to do mammogram screening however I have ordered it for her and told her it is up to her to cancel, she does have morbid obesity, past medical history of prediabetes and is on multiple psychiatric medications will screen appropriately for dyslipidemia, diabetes check renal function and kidney function.  Received flu injection here today.  I have personally reviewed and noted the following in the patient's chart:   . Medical and social history . Use of alcohol, tobacco or illicit drugs  . Current medications and supplements . Functional ability and status . Nutritional status . Physical activity . Advanced directives . List of other physicians . Hospitalizations, surgeries, and ER visits in previous 12 months . Vitals . Screenings to include cognitive, depression, and falls . Referrals and appointments  In addition, I have reviewed and discussed with patient certain preventive protocols, quality metrics, and best practice recommendations. A written personalized care plan for preventive services as well as general preventive health recommendations were provided to patient.     Delsa Grana, PA-C  01/25/2018

## 2018-01-26 ENCOUNTER — Encounter: Payer: Self-pay | Admitting: *Deleted

## 2018-01-26 LAB — COMPLETE METABOLIC PANEL WITH GFR
AG RATIO: 1.3 (calc) (ref 1.0–2.5)
ALT: 10 U/L (ref 6–29)
AST: 14 U/L (ref 10–35)
Albumin: 3.9 g/dL (ref 3.6–5.1)
Alkaline phosphatase (APISO): 66 U/L (ref 33–130)
BILIRUBIN TOTAL: 0.3 mg/dL (ref 0.2–1.2)
BUN: 17 mg/dL (ref 7–25)
CHLORIDE: 106 mmol/L (ref 98–110)
CO2: 30 mmol/L (ref 20–32)
Calcium: 9.1 mg/dL (ref 8.6–10.4)
Creat: 0.77 mg/dL (ref 0.50–1.05)
GFR, Est African American: 100 mL/min/{1.73_m2} (ref >=60)
GFR, Est Non African American: 86 mL/min/{1.73_m2} (ref >=60)
GLOBULIN: 3 g/dL (ref 1.9–3.7)
Glucose, Bld: 88 mg/dL (ref 65–99)
POTASSIUM: 4.7 mmol/L (ref 3.5–5.3)
SODIUM: 140 mmol/L (ref 135–146)
Total Protein: 6.9 g/dL (ref 6.1–8.1)

## 2018-01-26 LAB — HEMOGLOBIN A1C
HEMOGLOBIN A1C: 5.5 %{Hb} (ref <5.7)
MEAN PLASMA GLUCOSE: 111 (calc)
eAG (mmol/L): 6.2 (calc)

## 2018-01-26 LAB — LIPID PANEL
CHOLESTEROL: 169 mg/dL (ref <200)
HDL: 55 mg/dL (ref >50)
LDL Cholesterol (Calc): 93 mg/dL (calc)
NON-HDL CHOLESTEROL (CALC): 114 mg/dL (ref <130)
TRIGLYCERIDES: 115 mg/dL (ref <150)
Total CHOL/HDL Ratio: 3.1 (calc) (ref <5.0)

## 2018-02-07 ENCOUNTER — Other Ambulatory Visit: Payer: Self-pay | Admitting: Family Medicine

## 2018-02-09 ENCOUNTER — Other Ambulatory Visit: Payer: Self-pay | Admitting: Family Medicine

## 2018-02-13 ENCOUNTER — Other Ambulatory Visit: Payer: Self-pay | Admitting: Family Medicine

## 2018-02-15 ENCOUNTER — Ambulatory Visit (HOSPITAL_COMMUNITY): Payer: Medicare Other | Admitting: Psychiatry

## 2018-02-16 ENCOUNTER — Other Ambulatory Visit: Payer: Self-pay | Admitting: *Deleted

## 2018-02-16 MED ORDER — TRAMADOL HCL 50 MG PO TABS: 50.0000 mg | ORAL_TABLET | Freq: Two times a day (BID) | ORAL | 0 refills | Status: DC | PRN

## 2018-02-16 NOTE — Telephone Encounter (Signed)
Received call from patient.   Requested refill on Tramadol.   Ok to refill??  Last office visit 01/25/2018.  Last refill 01/01/2018.

## 2018-02-22 ENCOUNTER — Encounter (HOSPITAL_COMMUNITY): Payer: Self-pay | Admitting: Psychiatry

## 2018-02-22 ENCOUNTER — Ambulatory Visit (INDEPENDENT_AMBULATORY_CARE_PROVIDER_SITE_OTHER): Payer: Medicare Other | Admitting: Psychiatry

## 2018-02-22 VITALS — BP 162/92 | HR 80 | Ht 63.0 in | Wt 281.0 lb

## 2018-02-22 DIAGNOSIS — F4310 Post-traumatic stress disorder, unspecified: Secondary | ICD-10-CM | POA: Diagnosis not present

## 2018-02-22 MED ORDER — TRAZODONE HCL 150 MG PO TABS: 300.0000 mg | ORAL_TABLET | Freq: Every day | ORAL | 3 refills | Status: DC

## 2018-02-22 MED ORDER — LAMOTRIGINE 100 MG PO TABS: 100.0000 mg | ORAL_TABLET | Freq: Two times a day (BID) | ORAL | 2 refills | Status: DC

## 2018-02-22 MED ORDER — ALPRAZOLAM 1 MG PO TABS: ORAL_TABLET | ORAL | 0 refills | Status: DC
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
## 2018-03-07 ENCOUNTER — Other Ambulatory Visit: Payer: Self-pay | Admitting: Family Medicine

## 2018-03-19 ENCOUNTER — Other Ambulatory Visit: Payer: Self-pay | Admitting: Family Medicine

## 2018-03-19 NOTE — Telephone Encounter (Signed)
Requesting refill    Tramadol   LOV: 01/25/18  LRF:  02/16/18

## 2018-03-22 ENCOUNTER — Telehealth (HOSPITAL_COMMUNITY): Payer: Self-pay | Admitting: *Deleted

## 2018-03-22 ENCOUNTER — Other Ambulatory Visit (HOSPITAL_COMMUNITY): Payer: Self-pay | Admitting: Psychiatry

## 2018-03-22 MED ORDER — ALPRAZOLAM 1 MG PO TABS: ORAL_TABLET | ORAL | 2 refills | Status: DC

## 2018-03-22 NOTE — Telephone Encounter (Signed)
Dr Harrington Challenger Patient left msg. @ front office for Xanax refill. I called Rx & there are no refills on hold or on file

## 2018-03-22 NOTE — Telephone Encounter (Signed)
Sent!

## 2018-03-23 ENCOUNTER — Ambulatory Visit (HOSPITAL_COMMUNITY): Payer: Medicare Other | Admitting: Psychiatry

## 2018-04-03 ENCOUNTER — Other Ambulatory Visit: Payer: Self-pay | Admitting: Family Medicine

## 2018-04-19 ENCOUNTER — Other Ambulatory Visit: Payer: Self-pay | Admitting: Family Medicine

## 2018-04-19 NOTE — Telephone Encounter (Signed)
Ok to refill Tramadol??   Last office visit 01/25/2018.  Last refill 03/19/2018.

## 2018-05-02 ENCOUNTER — Ambulatory Visit (HOSPITAL_COMMUNITY): Payer: Medicare Other | Admitting: Psychiatry

## 2018-05-02 ENCOUNTER — Other Ambulatory Visit: Payer: Self-pay | Admitting: Family Medicine

## 2018-05-08 ENCOUNTER — Ambulatory Visit (HOSPITAL_COMMUNITY): Payer: Medicare Other | Admitting: Psychiatry

## 2018-05-08 ENCOUNTER — Encounter (HOSPITAL_COMMUNITY): Payer: Self-pay

## 2018-05-09 ENCOUNTER — Other Ambulatory Visit: Payer: Self-pay | Admitting: Family Medicine

## 2018-05-24 ENCOUNTER — Other Ambulatory Visit: Payer: Self-pay | Admitting: Family Medicine

## 2018-05-25 ENCOUNTER — Ambulatory Visit (INDEPENDENT_AMBULATORY_CARE_PROVIDER_SITE_OTHER): Payer: Medicare Other | Admitting: Psychiatry

## 2018-05-25 ENCOUNTER — Encounter (HOSPITAL_COMMUNITY): Payer: Self-pay | Admitting: Psychiatry

## 2018-05-25 VITALS — BP 154/90 | HR 91 | Ht 63.0 in | Wt 288.0 lb

## 2018-05-25 DIAGNOSIS — F4310 Post-traumatic stress disorder, unspecified: Secondary | ICD-10-CM | POA: Diagnosis not present

## 2018-05-25 MED ORDER — ALPRAZOLAM 1 MG PO TABS: ORAL_TABLET | ORAL | 2 refills | Status: DC

## 2018-05-25 MED ORDER — TRAZODONE HCL 150 MG PO TABS: 300.0000 mg | ORAL_TABLET | Freq: Every day | ORAL | 3 refills | Status: DC

## 2018-05-25 MED ORDER — LAMOTRIGINE 100 MG PO TABS: 100.0000 mg | ORAL_TABLET | Freq: Two times a day (BID) | ORAL | 2 refills | Status: DC
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
## 2018-05-28 ENCOUNTER — Other Ambulatory Visit: Payer: Self-pay | Admitting: Family Medicine

## 2018-05-28 DIAGNOSIS — M25561 Pain in right knee: Secondary | ICD-10-CM | POA: Diagnosis not present

## 2018-05-28 DIAGNOSIS — G8929 Other chronic pain: Secondary | ICD-10-CM | POA: Diagnosis not present

## 2018-05-28 DIAGNOSIS — M25562 Pain in left knee: Secondary | ICD-10-CM | POA: Diagnosis not present

## 2018-05-28 DIAGNOSIS — M170 Bilateral primary osteoarthritis of knee: Secondary | ICD-10-CM | POA: Diagnosis not present

## 2018-05-28 NOTE — Telephone Encounter (Signed)
Ok to refill??  Last office visit 01/25/2018.  Last refill 04/20/2018.

## 2018-05-30 ENCOUNTER — Ambulatory Visit (INDEPENDENT_AMBULATORY_CARE_PROVIDER_SITE_OTHER): Payer: Medicare Other | Admitting: Family Medicine

## 2018-05-30 ENCOUNTER — Encounter: Payer: Self-pay | Admitting: Family Medicine

## 2018-05-30 ENCOUNTER — Ambulatory Visit (HOSPITAL_COMMUNITY): Payer: Medicare Other | Admitting: Psychiatry

## 2018-05-30 VITALS — BP 162/104 | HR 72 | Temp 98.1°F | Resp 18 | Ht 63.0 in | Wt 292.0 lb

## 2018-05-30 DIAGNOSIS — E0590 Thyrotoxicosis, unspecified without thyrotoxic crisis or storm: Secondary | ICD-10-CM

## 2018-05-30 DIAGNOSIS — M797 Fibromyalgia: Secondary | ICD-10-CM

## 2018-05-30 DIAGNOSIS — R7303 Prediabetes: Secondary | ICD-10-CM

## 2018-05-30 DIAGNOSIS — F319 Bipolar disorder, unspecified: Secondary | ICD-10-CM | POA: Diagnosis not present

## 2018-05-30 DIAGNOSIS — R350 Frequency of micturition: Secondary | ICD-10-CM

## 2018-05-30 DIAGNOSIS — I10 Essential (primary) hypertension: Secondary | ICD-10-CM | POA: Diagnosis not present

## 2018-05-30 LAB — URINALYSIS, ROUTINE W REFLEX MICROSCOPIC
BILIRUBIN URINE: NEGATIVE
GLUCOSE, UA: NEGATIVE
HGB URINE DIPSTICK: NEGATIVE
Ketones, ur: NEGATIVE
Leukocytes,Ua: NEGATIVE
Nitrite: NEGATIVE
PH: 6 (ref 5.0–8.0)
Protein, ur: NEGATIVE
Specific Gravity, Urine: 1.025 (ref 1.001–1.03)

## 2018-05-30 MED ORDER — LISINOPRIL 20 MG PO TABS: 20.0000 mg | ORAL_TABLET | Freq: Every day | ORAL | 3 refills | Status: DC

## 2018-05-30 MED ORDER — GABAPENTIN 400 MG PO CAPS: 400.0000 mg | ORAL_CAPSULE | Freq: Three times a day (TID) | ORAL | 3 refills | Status: DC

## 2018-05-30 MED ORDER — FLUTICASONE PROPIONATE 50 MCG/ACT NA SUSP: NASAL | 2 refills | Status: DC

## 2018-05-30 MED ORDER — NYSTATIN 100000 UNIT/GM EX POWD: CUTANEOUS | 3 refills | Status: DC

## 2018-05-30 NOTE — Assessment & Plan Note (Signed)
Start lisinopril 20 mg once a day.  We will have her follow-up in 2 weeks to recheck her blood pressure peer we will check her thyroid she does have history of subclinical hyperthyroidism we will also check her renal function.  I think her blood pressures been going up in general because of the weight gain and her sedentary lifestyle.

## 2018-05-30 NOTE — Progress Notes (Signed)
Subjective:    Patient ID: Annette Galvan, female    DOB: Jan 22, 1961, 58 y.o.   MRN: [id number]  Patient presents for Frequent urination, cold chills, HA's and Elevated BP (05/25/18 - 154/90 , 05/28/18 - 150 100)   Pt here with elevated blood pressures at orthopedics and psychtrist over week. Last BP in chart Sept 151/75, Oct 132/68.  Weight up 10lbs since Oct, admits that she has been sitting around eating  Depression has worsened- set up for therapy by her psychiatrist, states  She has been trying to help her mother which has been overwhelming, no changes to lamictal, trazodone at her recent visit.   her fibromyalgia has been acting up, taking gabapentin 400mg  three times a day,asked if this could be increased  Has had frequent urination, no odor or burning past 2 weeks, also had some chills, no fever Drinks lots of energy drinks, sprite and coffee  Now taking fish oil, and B12    Review Of Systems:  GEN- denies fatigue, fever, weight loss,weakness, recent illness HEENT- denies eye drainage, change in vision, nasal discharge, CVS- denies chest pain, palpitations RESP- denies SOB, cough, wheeze ABD- denies N/V, change in stools, abd pain GU- denies dysuria, hematuria, dribbling, incontinence MSK- + joint pain, muscle aches, injury Neuro- denies headache, dizziness, syncope, seizure activity       Objective:    BP (!) 162/104   Pulse 72   Temp 98.1 F (36.7 C) (Oral)   Resp 18   Ht 5\' 3"  (1.6 m)   Wt 292 lb (132.5 kg)   SpO2 97%   BMI 51.73 kg/m  GEN- NAD, alert and oriented x3 HEENT- PERRL, EOMI, non injected sclera, pink conjunctiva, MMM, oropharynx clear Neck- Supple, no thyromegaly CVS- RRR, no murmur RESP-CTAB Psych- tearful at times, not anxious, no SI,well groomed, switches topics a lot  ABD-NABS,soft,NT,ND EXT- No edema Pulses- Radial  2+        Assessment & Plan:      Problem List Items Addressed This Visit      Unprioritized   Bipolar 1 disorder  ([place])    He is followed by psychiatry.  I do not recommend increasing the gabapentin this may have some effect on her mood.  She is going to start psychotherapy.  Defer her medications to her psychiatrist.  In general she appears to be her typical when she comes in for office visit she often switches topics a lot and speaks quickly but is very polite.      Borderline diabetic    Her last A1c was normal however she has gained 10 pounds we will recheck today.      Relevant Orders   Hemoglobin A1c   Fibromyalgia    Per above I do not recommend increasing her gabapentin at this time.      Hypertension    Start lisinopril 20 mg once a day.  We will have her follow-up in 2 weeks to recheck her blood pressure peer we will check her thyroid she does have history of subclinical hyperthyroidism we will also check her renal function.  I think her blood pressures been going up in general because of the weight gain and her sedentary lifestyle.      Relevant Medications   lisinopril (PRINIVIL,ZESTRIL) 20 MG tablet   Other Relevant Orders   CBC with Differential/Platelet   Comprehensive metabolic panel   TSH   Morbid obesity ([place])   Subclinical hyperthyroidism   Relevant Orders  TSH    Other Visit Diagnoses    Urinary frequency    -  Primary   Urinalysis is clear she does not have any true dysuria UTI symptoms.  I think she may have had a viral illness a couple weeks ago   Relevant Orders   Urinalysis, Routine w reflex microscopic (Completed)      Note: This dictation was prepared with Dragon dictation along with smaller phrase technology. Any transcriptional errors that result from this process are unintentional.

## 2018-05-30 NOTE — Assessment & Plan Note (Signed)
Her last A1c was normal however she has gained 10 pounds we will recheck today.

## 2018-05-30 NOTE — Patient Instructions (Signed)
F/U 2 weeks for Recheck on blood pressure

## 2018-05-30 NOTE — Assessment & Plan Note (Addendum)
Per above I do not recommend increasing her gabapentin at this time.

## 2018-05-30 NOTE — Assessment & Plan Note (Signed)
He is followed by psychiatry.  I do not recommend increasing the gabapentin this may have some effect on her mood.  She is going to start psychotherapy.  Defer her medications to her psychiatrist.  In general she appears to be her typical when she comes in for office visit she often switches topics a lot and speaks quickly but is very polite.

## 2018-05-31 LAB — CBC WITH DIFFERENTIAL/PLATELET
ABSOLUTE MONOCYTES: 519 {cells}/uL (ref 200–950)
BASOS PCT: 0.1 %
Basophils Absolute: 9 cells/uL (ref 0–200)
EOS ABS: 0 {cells}/uL — AB (ref 15–500)
Eosinophils Relative: 0 %
HCT: 42.2 % (ref 35.0–45.0)
Hemoglobin: 13.9 g/dL (ref 11.7–15.5)
Lymphs Abs: 1629 cells/uL (ref 850–3900)
MCH: 29.9 pg (ref 27.0–33.0)
MCHC: 32.9 g/dL (ref 32.0–36.0)
MCV: 90.8 fL (ref 80.0–100.0)
MPV: 10 fL (ref 7.5–12.5)
Monocytes Relative: 5.7 %
NEUTROS PCT: 76.3 %
Neutro Abs: 6943 cells/uL (ref 1500–7800)
PLATELETS: 307 10*3/uL (ref 140–400)
RBC: 4.65 10*6/uL (ref 3.80–5.10)
RDW: 13.1 % (ref 11.0–15.0)
TOTAL LYMPHOCYTE: 17.9 %
WBC: 9.1 10*3/uL (ref 3.8–10.8)

## 2018-05-31 LAB — COMPREHENSIVE METABOLIC PANEL
AG Ratio: 1.4 (calc) (ref 1.0–2.5)
ALBUMIN MSPROF: 4.1 g/dL (ref 3.6–5.1)
ALT: 11 U/L (ref 6–29)
AST: 12 U/L (ref 10–35)
Alkaline phosphatase (APISO): 68 U/L (ref 37–153)
BUN: 20 mg/dL (ref 7–25)
CO2: 28 mmol/L (ref 20–32)
Calcium: 9.1 mg/dL (ref 8.6–10.4)
Chloride: 104 mmol/L (ref 98–110)
Creat: 0.69 mg/dL (ref 0.50–1.05)
Globulin: 3 g/dL (calc) (ref 1.9–3.7)
Glucose, Bld: 104 mg/dL — ABNORMAL HIGH (ref 65–99)
Potassium: 4.7 mmol/L (ref 3.5–5.3)
Sodium: 141 mmol/L (ref 135–146)
Total Bilirubin: 0.4 mg/dL (ref 0.2–1.2)
Total Protein: 7.1 g/dL (ref 6.1–8.1)

## 2018-05-31 LAB — HEMOGLOBIN A1C
Hgb A1c MFr Bld: 5.6 % of total Hgb (ref <5.7)
Mean Plasma Glucose: 114 (calc)
eAG (mmol/L): 6.3 (calc)

## 2018-05-31 LAB — TSH: TSH: 0.13 mIU/L — ABNORMAL LOW (ref 0.40–4.50)

## 2018-06-05 ENCOUNTER — Other Ambulatory Visit: Payer: Self-pay | Admitting: *Deleted

## 2018-06-05 DIAGNOSIS — E0590 Thyrotoxicosis, unspecified without thyrotoxic crisis or storm: Secondary | ICD-10-CM

## 2018-06-07 ENCOUNTER — Ambulatory Visit (HOSPITAL_COMMUNITY): Payer: Medicare Other | Admitting: Psychiatry

## 2018-06-13 ENCOUNTER — Telehealth: Payer: Self-pay | Admitting: *Deleted

## 2018-06-13 NOTE — Telephone Encounter (Signed)
Received call from patient.   States that she has been taking Lisinopril for BP. Reports that BP is now WNL, but she has dry hacky cough.  MD please advise.

## 2018-06-13 NOTE — Telephone Encounter (Signed)
Pt was suppose to have f/u in office in 2 weeks from visit of 2/12 She can change to losartan 50mg  once a day, but still needs OV

## 2018-06-14 MED ORDER — LOSARTAN POTASSIUM 50 MG PO TABS: 50.0000 mg | ORAL_TABLET | Freq: Every day | ORAL | 0 refills | Status: DC

## 2018-06-14 NOTE — Telephone Encounter (Signed)
Call placed to patient and patient made aware.   Prescription sent to pharmacy.   States that she will schedule appt as soon as she can D/T transportation.

## 2018-06-20 ENCOUNTER — Ambulatory Visit (INDEPENDENT_AMBULATORY_CARE_PROVIDER_SITE_OTHER): Payer: Medicare Other | Admitting: "Endocrinology

## 2018-06-20 ENCOUNTER — Encounter: Payer: Self-pay | Admitting: "Endocrinology

## 2018-06-20 VITALS — BP 137/85 | HR 94 | Ht 63.0 in | Wt 291.6 lb

## 2018-06-20 DIAGNOSIS — E0590 Thyrotoxicosis, unspecified without thyrotoxic crisis or storm: Secondary | ICD-10-CM

## 2018-06-20 NOTE — Progress Notes (Signed)
Endocrinology follow-up note   Subjective:    Patient ID: Annette Galvan, female    DOB: 22-Nov-1960, PCP Alycia Rossetti, MD   Past Medical History:  Diagnosis Date  . Arthritis    needs 2 knee replacements  . Back pain, chronic   . Bilateral chronic knee pain   . Bipolar 1 disorder ([place])   . Bulging disc   . Fibromyalgia   . GERD (gastroesophageal reflux disease)   . HSV-2 (herpes simplex virus 2) infection 2013  . Hx of trichomoniasis   . Itching 06/26/2013  . Leg pain   . Trichimoniasis 06/26/2013  . UTI (lower urinary tract infection) 10/12/2012  . Vaginal discharge 06/26/2013  . Vaginal irritation 12/05/2013  . Vaginal odor 12/28/2012   Had discharge +clue  . Yeast infection 02/04/2013   Past Surgical History:  Procedure Laterality Date  . ABDOMINAL HYSTERECTOMY    . BREAST ENHANCEMENT SURGERY Right   . COLONOSCOPY WITH PROPOFOL N/A 12/07/2015   Procedure: COLONOSCOPY WITH PROPOFOL;  Surgeon: Daneil Dolin, MD;  [facility];  Service: Endoscopy;  Laterality: N/A;  915  . TUBAL LIGATION     Social History   Socioeconomic History  . Marital status: Widowed    Spouse name: Not on file  . Number of children: Not on file  . Years of education: Not on file  . Highest education level: Not on file  Occupational History  . Not on file  Social Needs  . Financial resource strain: Not on file  . Food insecurity:    Worry: Not on file    Inability: Not on file  . Transportation needs:    Medical: Not on file    Non-medical: Not on file  Tobacco Use  . Smoking status: Light Tobacco Smoker    Packs/day: 0.25    Years: 25.00    Pack years: 6.25    Types: Cigarettes  . Smokeless tobacco: Never Used  . Tobacco comment: 2 cigarettes daily  Substance and Sexual Activity  . Alcohol use: Yes    Alcohol/week: 0.0 standard drinks    Comment: occasional  . Drug use: No    Comment: cocaine, clean for 4.5 years as of 12/04/2015  . Sexual activity: Yes    Birth  control/protection: Surgical  Lifestyle  . Physical activity:    Days per week: Not on file    Minutes per session: Not on file  . Stress: Not on file  Relationships  . Social connections:    Talks on phone: Not on file    Gets together: Not on file    Attends religious service: Not on file    Active member of club or organization: Not on file    Attends meetings of clubs or organizations: Not on file    Relationship status: Not on file  Other Topics Concern  . Not on file  Social History Narrative  . Not on file   Outpatient Encounter Medications as of 06/20/2018  Medication Sig  . ALPRAZolam (XANAX) 1 MG tablet TAKE (1) TABLET BY MOUTH (4) TIMES DAILY AS NEEDED FOR ANXIETY.  . cetirizine (ZYRTEC) 10 MG tablet TAKE 1 TABLET BY MOUTH ONCE DAILY.  Marland Kitchen diclofenac sodium (VOLTAREN) 1 % GEL APPLY 2 GRAMS TO THE AFFECTED AREAS FOUR TIMES A DAY AS DIRECTED.  Marland Kitchen diphenhydramine-acetaminophen (ACETAMINOPHEN PM) 25-500 MG TABS tablet Take 4 tablets by mouth at bedtime as needed.  . fluticasone (FLONASE) 50 MCG/ACT nasal spray INHALE  2 SPRAYS IN EACH NOSTRIL TWICE DAILY AS DIRECTED.  Marland Kitchen gabapentin (NEURONTIN) 400 MG capsule Take 1 capsule (400 mg total) by mouth 3 (three) times daily.  . IBU 800 MG tablet TAKE ONE TABLET BY MOUTH 2 TIMES A DAY AS NEEDED FOR PAIN.  Marland Kitchen lamoTRIgine (LAMICTAL) 100 MG tablet Take 1 tablet (100 mg total) by mouth 2 (two) times daily.  Marland Kitchen losartan (COZAAR) 50 MG tablet Take 1 tablet (50 mg total) by mouth daily.  Marland Kitchen nystatin (NYAMYC) powder APPLY TO THE AFFECTED AREAS FOUR TIMES DAILY AS DIRECTED.  Marland Kitchen omeprazole (PRILOSEC) 40 MG capsule TAKE (1) CAPSULE BY MOUTH ONCE DAILY FOR ACID REFLUX.  Marland Kitchen ondansetron (ZOFRAN) 4 MG tablet Take 1 tablet (4 mg total) by mouth every 6 (six) hours.  . traMADol (ULTRAM) 50 MG tablet TAKE 1 TABLET BY MOUTH EVERY 12 HOURS AS NEEDED.  Marland Kitchen traZODone (DESYREL) 150 MG tablet Take 2 tablets (300 mg total) by mouth at bedtime.  . valACYclovir (VALTREX)  1000 MG tablet TAKE (1) TABLET BY MOUTH TWICE DAILY.   No facility-administered encounter medications on file as of 06/20/2018.    ALLERGIES: Allergies  Allergen Reactions  . Hydrocodone Nausea And Vomiting  . Latex Swelling    Ankle Area  . Lisinopril Cough  . Ampicillin Hives and Nausea And Vomiting    VACCINATION STATUS: Immunization History  Administered Date(s) Administered  . Influenza Split 04/23/2012  . Influenza,inj,Quad PF,6+ Mos 01/09/2017, 01/25/2018  . Influenza,trivalent, recombinat, inj, PF 01/28/2013  . Influenza-Unspecified 05/08/2017  . Pneumococcal Polysaccharide-23 05/26/2014  . Tdap 11/12/2014    HPI 58 year old female with medical history as above. She was seen in August 2018 in consultation for subclinical hyperthyroidism.  She missed her several last appointments.  She is returning with repeat labs showing suppressed TSH, no associated thyroid hormones.   -Complains of increased appetite which caused unintentional weight gain. -She has observed tremors, palpitations, heat/cold intolerance alternating.   - Review of her medical records include thyroid sonograms in May 2017 which showed 5.8 cm right lobe and 4.47 m left lobe with no nodules. She also had thyroid uptake and scan in May 2017 significant for only 6.9% uptake.  - She has chronic mild tremor of upper extremities, worsening recently. - She denies family history of thyroid dysfunction. She denies dysphagia, shortness of breath, nor voice change.  Review of Systems  Constitutional:  + weight gain , no fatigue, no subjective hyperthermia, no subjective hypothermia Eyes: no blurry vision, no xerophthalmia ENT: no sore throat, no nodules palpated in throat, no dysphagia/odynophagia, no hoarseness Cardiovascular: no Chest Pain, no Shortness of Breath, + palpitations, no leg swelling Respiratory: no cough, no SOB Gastrointestinal: no Nausea/Vomiting/Diarhhea Musculoskeletal: no muscle/joint  aches Skin: no rashes Neurological: + tremors, no numbness, no tingling, no dizziness Psychiatric: + depression, + anxiety  Objective:    BP 137/85   Pulse 94   Ht 5\' 3"  (1.6 m)   Wt 291 lb 9.6 oz (132.3 kg)   BMI 51.65 kg/m   Wt Readings from Last 3 Encounters:  06/20/18 291 lb 9.6 oz (132.3 kg)  05/30/18 292 lb (132.5 kg)  01/25/18 283 lb (128.4 kg)    Physical Exam  Constitutional: obese, not in acute distress, normal state of mind Eyes: PERRLA, EOMI, no exophthalmos ENT: moist mucous membranes, + palpable thyromegaly , no cervical lymphadenopathy Cardiovascular: normal precordial activity, Regular Rate and Rhythm, no Murmur/Rubs/Gallops Respiratory:  adequate breathing efforts, no gross chest deformity, Clear to auscultation  bilaterally Gastrointestinal: abdomen soft, Non -tender, No distension, Bowel Sounds present Musculoskeletal: no gross deformities, strength intact in all four extremities, + large extremities. Skin: moist, warm, no rashes Neurological: + Tremors of outstretched hands, + deep tendon reflex on bilateral lower extremities.     CMP ( most recent) CMP     Component Value Date/Time   NA 141 05/30/2018 1208   K 4.7 05/30/2018 1208   CL 104 05/30/2018 1208   CO2 28 05/30/2018 1208   GLUCOSE 104 (H) 05/30/2018 1208   BUN 20 05/30/2018 1208   CREATININE 0.69 05/30/2018 1208   CALCIUM 9.1 05/30/2018 1208   PROT 7.1 05/30/2018 1208   ALBUMIN 4.1 08/12/2016 1057   AST 12 05/30/2018 1208   ALT 11 05/30/2018 1208   ALKPHOS 69 08/12/2016 1057   BILITOT 0.4 05/30/2018 1208   GFRNONAA 86 01/25/2018 1123   GFRAA 100 01/25/2018 1123     Diabetic Labs (most recent): Lab Results  Component Value Date   HGBA1C 5.6 05/30/2018   HGBA1C 5.5 01/25/2018   HGBA1C 5.2 08/12/2016     Lipid Panel ( most recent) Lipid Panel     Component Value Date/Time   CHOL 169 01/25/2018 1123   TRIG 115 01/25/2018 1123   HDL 55 01/25/2018 1123   CHOLHDL 3.1 01/25/2018  1123   VLDL 23 08/12/2016 1057   LDLCALC 93 01/25/2018 1123    Results for Annette Galvan, Annette Galvan (MRN [id number]) as of 06/20/2018 13:37  Ref. Range 10/14/2016 12:23 05/30/2018 12:08  TSH Latest Ref Range: 0.40 - 4.50 mIU/L 0.27 (L) 0.13 (L)  Triiodothyronine,Free,Serum Latest Ref Range: 2.3 - 4.2 pg/mL 3.2   T4,Free(Direct) Latest Ref Range: 0.8 - 1.8 ng/dL 1.3   Thyroglobulin Ab Latest Ref Range: <2 IU/mL <1   Thyroperoxidase Ab SerPl-aCnc Latest Ref Range: <9 IU/mL 2       Assessment & Plan:   1. Subclinical hyperthyroidism -She presents with some nonspecific symptoms.  Her most recent thyroid function tests is not a complete set only showing suppressed TSH at 0.13.  She will be sent for a complete profile thyroid function tests, as well as thyroid uptake and scan.    -Her thyroid uptake and scan in 2017 was unremarkable, well as her thyroid sonogram from May 2017. -She is urged to return in 10 days to review her results and treatment decision if necessary.  - I advised patient to maintain close follow up with Alycia Rossetti, MD for primary care needs. Follow up plan: Return in about 10 days (around 06/30/2018) for Labs Today- Non-Fasting Ok, Follow up with Thyroid Uptake and Scan.  Glade Lloyd, MD Phone: [phone]  Fax: [phone]   06/20/2018, 3:09 PM

## 2018-06-20 NOTE — Progress Notes (Signed)
LeighAnn Wilferd Ritson, CMA  

## 2018-06-21 DIAGNOSIS — E0590 Thyrotoxicosis, unspecified without thyrotoxic crisis or storm: Secondary | ICD-10-CM | POA: Diagnosis not present

## 2018-06-21 LAB — T4, FREE: Free T4: 1.4 ng/dL (ref 0.8–1.8)

## 2018-06-21 LAB — T3, FREE: T3, Free: 3.5 pg/mL (ref 2.3–4.2)

## 2018-06-21 LAB — TSH: TSH: 0.54 mIU/L (ref 0.40–4.50)

## 2018-06-22 ENCOUNTER — Ambulatory Visit: Payer: Self-pay | Admitting: Family Medicine

## 2018-06-25 ENCOUNTER — Other Ambulatory Visit: Payer: Self-pay | Admitting: Family Medicine

## 2018-06-25 NOTE — Telephone Encounter (Signed)
Ok to refill??  Last office visit 05/30/2018.  Last refill 05/28/2018.

## 2018-06-28 ENCOUNTER — Encounter (HOSPITAL_COMMUNITY): Payer: Self-pay

## 2018-06-28 ENCOUNTER — Encounter (HOSPITAL_COMMUNITY)
Admission: RE | Admit: 2018-06-28 | Discharge: 2018-06-28 | Disposition: A | Discharge status: Home / Self Care | Payer: Medicare Other | Source: Ambulatory Visit | Attending: "Endocrinology | Admitting: "Endocrinology

## 2018-06-28 ENCOUNTER — Other Ambulatory Visit: Payer: Self-pay

## 2018-06-28 DIAGNOSIS — E0590 Thyrotoxicosis, unspecified without thyrotoxic crisis or storm: Secondary | ICD-10-CM | POA: Diagnosis not present

## 2018-06-28 MED ORDER — SODIUM IODIDE I-123 7.4 MBQ CAPS
306.0000 | ORAL_CAPSULE | Freq: Once | ORAL | Status: AC
  Administered 2018-06-28: 306 via ORAL

## 2018-06-29 ENCOUNTER — Other Ambulatory Visit: Payer: Self-pay

## 2018-06-29 ENCOUNTER — Encounter (HOSPITAL_COMMUNITY)
Admission: RE | Admit: 2018-06-29 | Discharge: 2018-06-29 | Disposition: A | Discharge status: Home / Self Care | Payer: Medicare Other | Source: Ambulatory Visit | Attending: "Endocrinology | Admitting: "Endocrinology

## 2018-06-29 DIAGNOSIS — E0590 Thyrotoxicosis, unspecified without thyrotoxic crisis or storm: Secondary | ICD-10-CM | POA: Diagnosis not present

## 2018-07-06 ENCOUNTER — Other Ambulatory Visit: Payer: Self-pay | Admitting: Family Medicine

## 2018-07-09 ENCOUNTER — Ambulatory Visit: Payer: Self-pay | Admitting: "Endocrinology

## 2018-07-10 ENCOUNTER — Other Ambulatory Visit: Payer: Self-pay | Admitting: Family Medicine

## 2018-07-19 ENCOUNTER — Other Ambulatory Visit: Payer: Self-pay | Admitting: Family Medicine

## 2018-07-20 NOTE — Telephone Encounter (Signed)
Requesting refill    Tramadol  LOV: 05/30/18  LRF:  06/25/18

## 2018-08-01 ENCOUNTER — Other Ambulatory Visit: Payer: Self-pay | Admitting: Family Medicine

## 2018-08-03 ENCOUNTER — Other Ambulatory Visit: Payer: Self-pay | Admitting: Family Medicine

## 2018-08-15 ENCOUNTER — Ambulatory Visit (INDEPENDENT_AMBULATORY_CARE_PROVIDER_SITE_OTHER): Payer: Medicare Other | Admitting: Psychiatry

## 2018-08-15 ENCOUNTER — Encounter (HOSPITAL_COMMUNITY): Payer: Self-pay | Admitting: Psychiatry

## 2018-08-15 ENCOUNTER — Other Ambulatory Visit: Payer: Self-pay

## 2018-08-15 DIAGNOSIS — F4310 Post-traumatic stress disorder, unspecified: Secondary | ICD-10-CM | POA: Diagnosis not present

## 2018-08-15 MED ORDER — LAMOTRIGINE 100 MG PO TABS: 100.0000 mg | ORAL_TABLET | Freq: Two times a day (BID) | ORAL | 2 refills | Status: DC

## 2018-08-15 MED ORDER — TRAZODONE HCL 150 MG PO TABS: 300.0000 mg | ORAL_TABLET | Freq: Every day | ORAL | 3 refills | Status: DC

## 2018-08-15 MED ORDER — ALPRAZOLAM 1 MG PO TABS: ORAL_TABLET | ORAL | 2 refills | Status: DC
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
## 2018-08-22 ENCOUNTER — Other Ambulatory Visit: Payer: Self-pay | Admitting: Family Medicine

## 2018-08-22 NOTE — Telephone Encounter (Signed)
Ok to refill??  Last office visit 05/30/2018.  Last refill 07/20/2018.

## 2018-08-24 ENCOUNTER — Other Ambulatory Visit: Payer: Self-pay

## 2018-08-24 ENCOUNTER — Encounter: Payer: Self-pay | Admitting: Family Medicine

## 2018-08-24 ENCOUNTER — Ambulatory Visit (INDEPENDENT_AMBULATORY_CARE_PROVIDER_SITE_OTHER): Payer: Medicare Other | Admitting: Family Medicine

## 2018-08-24 DIAGNOSIS — M797 Fibromyalgia: Secondary | ICD-10-CM | POA: Diagnosis not present

## 2018-08-24 DIAGNOSIS — M170 Bilateral primary osteoarthritis of knee: Secondary | ICD-10-CM | POA: Diagnosis not present

## 2018-08-24 DIAGNOSIS — I10 Essential (primary) hypertension: Secondary | ICD-10-CM

## 2018-08-24 MED ORDER — GABAPENTIN 600 MG PO TABS: 600.0000 mg | ORAL_TABLET | Freq: Three times a day (TID) | ORAL | 3 refills | Status: DC

## 2018-08-24 NOTE — Progress Notes (Signed)
Virtual Visit via Telephone Note  I connected with Annette Galvan on 08/24/18 at 9:10am by telephone and verified that I am speaking with the correct person using two identifiers.     Pt location: at home   Physician location:  In office, [facility], Vic Blackbird MD     On call: patient and physician   I discussed the limitations, risks, security and privacy concerns of performing an evaluation and management service by telephone and the availability of in person appointments. I also discussed with the patient that there may be a patient responsible charge related to this service. The patient expressed understanding and agreed to proceed.   History of Present Illness: HTN-started on losartan 50mg  once a day at visit in Feb, due to elevated BP, also referred to endocrinology for subclinical hyperthyroidism which is being monitored  134/83 last check by her nurse aide, no CP, HA, no SOB   Chronic pain- continued on ultram, voltaren gel  has gabapentin for her fibromyalgia but states she hurts all over, anytime someone touches her arms or legs, or pet jumps on her she has pain She has been stressed lately because her brother has taken all of her mothers estate, requested higher dose of gabapentin, she did take an extra one some days and it helped    Continues to follow with her psychiatrist - had televisit on 4/29 no changes made  Obesity- admits she has been snacking and has gained weight, ways 310 at home now   Observations/Objective: Telephone visit, NAD   Assessment and Plan: HTN- improved BP on home readings, continue losartan OV in 2 months for labs  Chronic pain- mix of OA and fibromyalgia, keep ultram 50mg  BID, increase gabapentin to 600mg  TID as her max Continue topical gel for OA  Obesity- discussed how this affects her mental health and physical health, discussed healthy snacks, things to do to avoid stress eating, she also has psychiatrist    Follow Up  Instructions: F/U  2 months     I discussed the assessment and treatment plan with the patient. The patient was provided an opportunity to ask questions and all were answered. The patient agreed with the plan and demonstrated an understanding of the instructions.   The patient was advised to call back or seek an in-person evaluation if the symptoms worsen or if the condition fails to improve as anticipated.  I provided 10 minutes of non-face-to-face time during this encounter.   Vic Blackbird, MD

## 2018-08-29 ENCOUNTER — Ambulatory Visit (HOSPITAL_COMMUNITY): Payer: Medicare Other | Admitting: Psychiatry

## 2018-09-04 ENCOUNTER — Encounter: Payer: Self-pay | Admitting: Family Medicine

## 2018-09-04 ENCOUNTER — Other Ambulatory Visit: Payer: Self-pay

## 2018-09-04 ENCOUNTER — Ambulatory Visit (INDEPENDENT_AMBULATORY_CARE_PROVIDER_SITE_OTHER): Payer: Medicare Other | Admitting: Family Medicine

## 2018-09-04 DIAGNOSIS — J0100 Acute maxillary sinusitis, unspecified: Secondary | ICD-10-CM

## 2018-09-04 MED ORDER — CEFDINIR 300 MG PO CAPS: 300.0000 mg | ORAL_CAPSULE | Freq: Two times a day (BID) | ORAL | 0 refills | Status: DC

## 2018-09-04 NOTE — Progress Notes (Signed)
Virtual Visit via Telephone Note  I connected with Annette Galvan on 09/04/18 at 3: 27pm by telephone and verified that I am speaking with the correct person using two identifiers.    Pt location: at home   Physician location:  In office, [facility], Vic Blackbird MD     On call: patient and physician     I discussed the limitations, risks, security and privacy concerns of performing an evaluation and management service by telephone and the availability of in person appointments. I also discussed with the patient that there may be a patient responsible charge related to this service. The patient expressed understanding and agreed to proceed.   History of Present Illness:  Has sinus pressure with headache, feels raw in nose for past  8 days Has been using zyrtec has helped with the sneezing. No fever. No cough, no SOB No COVID -19 contact she is aware of Stays in home most of the time She is still using the flonase but it does burn when she uses it Feels like her previous sinus infections    Observations/Objective: NAD, speaking full sentences  Assessment and Plan: Acute sinusitis-worsening sinus pressure drainage, despite allergy treatment. Has had sinus infection in the past. Give Omnicef 300mg  BID x 10 days, hold flonase, try nasal saline since she feels raw in mucosa  continue zyrtec for now   Follow Up Instructions:    I discussed the assessment and treatment plan with the patient. The patient was provided an opportunity to ask questions and all were answered. The patient agreed with the plan and demonstrated an understanding of the instructions.   The patient was advised to call back or seek an in-person evaluation if the symptoms worsen or if the condition fails to improve as anticipated.  I provided 4 minutes of non-face-to-face time during this encounter. End time: 3:31pm  Vic Blackbird, MD

## 2018-09-14 ENCOUNTER — Other Ambulatory Visit: Payer: Self-pay | Admitting: Family Medicine

## 2018-09-24 ENCOUNTER — Other Ambulatory Visit: Payer: Self-pay | Admitting: *Deleted

## 2018-09-24 MED ORDER — TRAMADOL HCL 50 MG PO TABS: 50.0000 mg | ORAL_TABLET | Freq: Two times a day (BID) | ORAL | 0 refills | Status: DC | PRN

## 2018-09-24 NOTE — Telephone Encounter (Signed)
Received call from patient.   Requested refill on Tramadol.   Ok to refill??  Last office visit 09/04/2018.  Last refill 08/22/2018.

## 2018-09-25 ENCOUNTER — Other Ambulatory Visit: Payer: Self-pay | Admitting: *Deleted

## 2018-09-25 ENCOUNTER — Other Ambulatory Visit: Payer: Self-pay | Admitting: Family Medicine

## 2018-09-25 MED ORDER — LOSARTAN POTASSIUM 50 MG PO TABS: 50.0000 mg | ORAL_TABLET | Freq: Every day | ORAL | 1 refills | Status: DC

## 2018-09-25 MED ORDER — IBUPROFEN 800 MG PO TABS: ORAL_TABLET | ORAL | 1 refills | Status: DC

## 2018-10-05 ENCOUNTER — Ambulatory Visit: Payer: Medicare Other | Admitting: Family Medicine

## 2018-10-11 ENCOUNTER — Ambulatory Visit: Payer: Medicare Other | Admitting: Family Medicine

## 2018-10-22 ENCOUNTER — Other Ambulatory Visit: Payer: Self-pay | Admitting: Family Medicine

## 2018-10-22 NOTE — Telephone Encounter (Signed)
Ok to refill Tramadol??  Last office visit 09/04/2018.  Last refill 09/24/2018.

## 2018-10-24 ENCOUNTER — Other Ambulatory Visit: Payer: Self-pay

## 2018-10-24 ENCOUNTER — Ambulatory Visit (INDEPENDENT_AMBULATORY_CARE_PROVIDER_SITE_OTHER): Payer: Medicare Other | Admitting: Family Medicine

## 2018-10-24 VITALS — BP 138/70 | HR 78 | Temp 98.3°F | Resp 18 | Ht 64.0 in | Wt 301.6 lb

## 2018-10-24 DIAGNOSIS — J0101 Acute recurrent maxillary sinusitis: Secondary | ICD-10-CM

## 2018-10-24 DIAGNOSIS — R7303 Prediabetes: Secondary | ICD-10-CM

## 2018-10-24 MED ORDER — AMOXICILLIN-POT CLAVULANATE 875-125 MG PO TABS: 1.0000 | ORAL_TABLET | Freq: Two times a day (BID) | ORAL | 0 refills | Status: AC

## 2018-10-25 ENCOUNTER — Encounter (HOSPITAL_COMMUNITY): Payer: Self-pay | Admitting: Psychiatry

## 2018-10-25 ENCOUNTER — Ambulatory Visit (INDEPENDENT_AMBULATORY_CARE_PROVIDER_SITE_OTHER): Payer: Medicare Other | Admitting: Psychiatry

## 2018-10-25 DIAGNOSIS — F4310 Post-traumatic stress disorder, unspecified: Secondary | ICD-10-CM | POA: Diagnosis not present

## 2018-10-25 LAB — COMPLETE METABOLIC PANEL WITH GFR
AG Ratio: 1.5 (calc) (ref 1.0–2.5)
ALT: 10 U/L (ref 6–29)
AST: 13 U/L (ref 10–35)
Albumin: 3.9 g/dL (ref 3.6–5.1)
Alkaline phosphatase (APISO): 66 U/L (ref 37–153)
BUN: 13 mg/dL (ref 7–25)
CO2: 28 mmol/L (ref 20–32)
Calcium: 8.7 mg/dL (ref 8.6–10.4)
Chloride: 107 mmol/L (ref 98–110)
Creat: 0.7 mg/dL (ref 0.50–1.05)
GFR, Est African American: 111 mL/min/{1.73_m2} (ref >=60)
GFR, Est Non African American: 96 mL/min/{1.73_m2} (ref >=60)
Globulin: 2.6 g/dL (calc) (ref 1.9–3.7)
Glucose, Bld: 82 mg/dL (ref 65–99)
Potassium: 5 mmol/L (ref 3.5–5.3)
Sodium: 142 mmol/L (ref 135–146)
Total Bilirubin: 0.2 mg/dL (ref 0.2–1.2)
Total Protein: 6.5 g/dL (ref 6.1–8.1)

## 2018-10-25 LAB — HEMOGLOBIN A1C
Hgb A1c MFr Bld: 5.5 % of total Hgb (ref <5.7)
Mean Plasma Glucose: 111 (calc)
eAG (mmol/L): 6.2 (calc)

## 2018-10-25 MED ORDER — TRAZODONE HCL 150 MG PO TABS: 300.0000 mg | ORAL_TABLET | Freq: Every day | ORAL | 3 refills | Status: DC

## 2018-10-25 MED ORDER — LAMOTRIGINE 100 MG PO TABS: 100.0000 mg | ORAL_TABLET | Freq: Two times a day (BID) | ORAL | 2 refills | Status: DC

## 2018-10-25 MED ORDER — ALPRAZOLAM 1 MG PO TABS: ORAL_TABLET | ORAL | 2 refills | Status: DC
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
## 2018-10-29 ENCOUNTER — Telehealth: Payer: Self-pay | Admitting: *Deleted

## 2018-10-29 MED ORDER — CETIRIZINE HCL 10 MG PO TABS: 10.0000 mg | ORAL_TABLET | Freq: Every day | ORAL | 0 refills | Status: DC

## 2018-10-29 MED ORDER — LEVOFLOXACIN 500 MG PO TABS: 500.0000 mg | ORAL_TABLET | Freq: Every day | ORAL | 0 refills | Status: DC

## 2018-10-29 NOTE — Telephone Encounter (Signed)
Received call from patient.   Reports that she had reaction to Amoxicillin. States that she had hives and itching up her neck and the side of the face. Requested new ABTx.   Call placed to patient to inquire. States that she was given ABTx on 7/8 for sinusitis, but the pharmacy did not fill until 7/10 d/t possible contraindication (allergic to ampicillin). States that she started medication on 7/11. Took (2) doses on 7/11 and 7/12. Noted hives and itching to face and neck. Stopped medication and began taking Zyrtec and Benadryl. States that hives had resolved.   Requested new ABTx for sinusitis. MD please advise.   Ok to add amoxicillin to allergy list?

## 2018-10-29 NOTE — Addendum Note (Signed)
Addended by: Sheral Flow on: 10/29/2018 02:58 PM   Modules accepted: Orders

## 2018-10-29 NOTE — Telephone Encounter (Signed)
Call placed to patient and patient made aware.   Prescription sent to pharmacy.  

## 2018-10-29 NOTE — Telephone Encounter (Signed)
Given Levaquin 500mg  once a day for 7 days

## 2018-10-31 ENCOUNTER — Encounter: Payer: Self-pay | Admitting: Family Medicine

## 2018-10-31 NOTE — Assessment & Plan Note (Signed)
weight increasing, eating more not exercising, lifestyle changes/diet/exercise counseling given, f/up with PCP

## 2018-10-31 NOTE — Assessment & Plan Note (Signed)
Last A1C normal, continues to gain weight, here to recheck

## 2018-10-31 NOTE — Progress Notes (Signed)
Patient ID: Annette Galvan, female    DOB: 1960/06/09, 58 y.o.   MRN: [id number]  PCP: Alycia Rossetti, MD  Chief Complaint  Patient presents with  . Diabetes    4 month f/u    Subjective:   Annette Galvan is a 58 y.o. female, presents to clinic for DM and weight follow up, also has acute complaint of sinusitis.    DM - states here to recheck her labs for DM.  I reviewed prior labs and last A1C was 5 months ago and was 5.6, prior to that 5.5.  Scrolling back to 2017 that was the last time that A1C was elevated in prediabetes range at 5.7%.   Weight has gone up a little this year.    Wt Readings from Last 5 Encounters:  10/24/18 (!) 301 lb 9.6 oz (136.8 kg)  06/20/18 291 lb 9.6 oz (132.3 kg)  05/30/18 292 lb (132.5 kg)  01/25/18 283 lb (128.4 kg)  12/24/17 276 lb (125.2 kg)  She reports eating more and gym is closed, so she is not exercising any more and her knees are bothering her.  Also she complains of sinus infection that first started 2 months ago.  She got antibiotics but did not complete them all and although it improved it did not resolve and over the past 2 weeks or so the congestion and pain has worsened.  No fever.  Patient Active Problem List   Diagnosis Date Noted  . Candidal intertrigo 01/09/2017  . Polypharmacy 11/13/2015  . Subclinical hyperthyroidism 08/14/2015  . GERD (gastroesophageal reflux disease) 11/12/2014  . Breast asymmetry between native breast and reconstructed breast 05/26/2014  . Inversion deformity of right foot 05/26/2014  . OA (osteoarthritis) of knee 05/17/2013  . Encounter for screening colonoscopy 12/26/2012  . Sleep apnea 09/13/2012  . Tobacco use 09/13/2012  . Hypertension 02/10/2012  . Fibromyalgia 10/11/2011  . Morbid obesity ([place]) 08/29/2011  . Borderline diabetic 08/29/2011  . Bipolar 1 disorder ([place]) 08/29/2011  . Difficulty in walking(719.7) 05/10/2011  . DDD (degenerative disc disease), lumbar 04/04/2007    Current Meds   Medication Sig  . cefdinir (OMNICEF) 300 MG capsule Take 1 capsule (300 mg total) by mouth 2 (two) times daily. For sinus infection  . diclofenac sodium (VOLTAREN) 1 % GEL APPLY 2 GRAMS TO THE AFFECTED AREAS FOUR TIMES A DAY AS DIRECTED.  Marland Kitchen diphenhydramine-acetaminophen (ACETAMINOPHEN PM) 25-500 MG TABS tablet Take 4 tablets by mouth at bedtime as needed.  . fluticasone (FLONASE) 50 MCG/ACT nasal spray INHALE 2 SPRAYS IN EACH NOSTRIL TWICE DAILY AS DIRECTED.  Marland Kitchen gabapentin (NEURONTIN) 600 MG tablet Take 1 tablet (600 mg total) by mouth 3 (three) times daily.  Marland Kitchen ibuprofen (IBU) 800 MG tablet TAKE ONE TABLET BY MOUTH 2 TIMES A DAY AS NEEDED FOR PAIN.  Marland Kitchen losartan (COZAAR) 50 MG tablet TAKE 1 TABLET BY MOUTH ONCE A DAY.  Marland Kitchen nystatin (NYAMYC) powder APPLY TO THE AFFECTED AREAS FOUR TIMES DAILY AS DIRECTED.  Marland Kitchen omeprazole (PRILOSEC) 40 MG capsule TAKE (1) CAPSULE BY MOUTH ONCE DAILY FOR ACID REFLUX.  Marland Kitchen ondansetron (ZOFRAN) 4 MG tablet Take 1 tablet (4 mg total) by mouth every 6 (six) hours.  . traMADol (ULTRAM) 50 MG tablet TAKE 1 TABLET BY MOUTH EVERY 12 HOURS AS NEEDED.  Marland Kitchen valACYclovir (VALTREX) 1000 MG tablet TAKE (1) TABLET BY MOUTH TWICE DAILY.  . [DISCONTINUED] ALPRAZolam (XANAX) 1 MG tablet TAKE (1) TABLET BY MOUTH (4) TIMES DAILY AS  NEEDED FOR ANXIETY.  . [DISCONTINUED] cetirizine (ZYRTEC) 10 MG tablet TAKE 1 TABLET BY MOUTH ONCE DAILY.  . [DISCONTINUED] gabapentin (NEURONTIN) 400 MG capsule Take 1 capsule (400 mg total) by mouth 3 (three) times daily.  . [DISCONTINUED] lamoTRIgine (LAMICTAL) 100 MG tablet Take 1 tablet (100 mg total) by mouth 2 (two) times daily.  . [DISCONTINUED] traZODone (DESYREL) 150 MG tablet Take 2 tablets (300 mg total) by mouth at bedtime.     Review of Systems  Constitutional: Negative.   HENT: Negative.   Eyes: Negative.   Respiratory: Negative.   Cardiovascular: Negative.   Gastrointestinal: Negative.   Endocrine: Negative.   Genitourinary: Negative.    Musculoskeletal: Negative.   Skin: Negative.   Allergic/Immunologic: Negative.   Neurological: Negative.   Hematological: Negative.   Psychiatric/Behavioral: Negative.   All other systems reviewed and are negative.      Objective:    Vitals:   10/24/18 1027  BP: 138/70  Pulse: 78  Resp: 18  Temp: 98.3 F (36.8 C)  SpO2: 96%  Weight: (!) 301 lb 9.6 oz (136.8 kg)  Height: 5\' 4"  (1.626 m)      Physical Exam Vitals signs and nursing note reviewed.  Constitutional:      General: She is not in acute distress.    Appearance: She is well-developed. She is obese. She is not ill-appearing or toxic-appearing.  HENT:     Head: Normocephalic and atraumatic.     Right Ear: Tympanic membrane and external ear normal.     Left Ear: Tympanic membrane and external ear normal.     Nose: Mucosal edema and congestion present.     Right Turbinates: Enlarged.     Left Turbinates: Enlarged.     Right Sinus: Maxillary sinus tenderness present. No frontal sinus tenderness.     Left Sinus: Maxillary sinus tenderness present. No frontal sinus tenderness.     Mouth/Throat:     Pharynx: Oropharynx is clear. Uvula midline. No pharyngeal swelling or posterior oropharyngeal erythema.     Tonsils: No tonsillar exudate.  Eyes:     General:        Right eye: No discharge.        Left eye: No discharge.     Conjunctiva/sclera: Conjunctivae normal.  Neck:     Musculoskeletal: Normal range of motion.     Trachea: No tracheal deviation.  Cardiovascular:     Rate and Rhythm: Normal rate and regular rhythm.     Pulses: Normal pulses.     Heart sounds: Normal heart sounds. No murmur. No friction rub. No gallop.   Pulmonary:     Effort: Pulmonary effort is normal. No respiratory distress.     Breath sounds: No stridor. No wheezing, rhonchi or rales.  Abdominal:     General: Bowel sounds are normal. There is no distension.  Musculoskeletal:     Right lower leg: No edema.     Left lower leg: No  edema.  Lymphadenopathy:     Cervical: No cervical adenopathy.  Skin:    General: Skin is warm and dry.     Coloration: Skin is not pale.     Findings: No rash.  Neurological:     Mental Status: She is alert.     Motor: No abnormal muscle tone.     Coordination: Coordination normal.     Gait: Gait abnormal.  Psychiatric:        Mood and Affect: Mood normal.  Assessment & Plan:   Problem List Items Addressed This Visit      Other   Morbid obesity ([place])    weight increasing, eating more not exercising, lifestyle changes/diet/exercise counseling given, f/up with PCP      Relevant Orders   Hemoglobin A1c (Completed)   COMPLETE METABOLIC PANEL WITH GFR (Completed)   Borderline diabetic - Primary    Last A1C normal, continues to gain weight, here to recheck      Relevant Orders   Hemoglobin A1c (Completed)   COMPLETE METABOLIC PANEL WITH GFR (Completed)    Other Visit Diagnoses    Acute recurrent maxillary sinusitis       ongoing sx, tx underlying allergies, start and complete abx, f/up as needed   Relevant Medications   amoxicillin-clavulanate (AUGMENTIN) 875-125 MG tablet          Delsa Grana, PA-C 10/31/18 10:33 PM

## 2018-11-12 ENCOUNTER — Other Ambulatory Visit: Payer: Self-pay | Admitting: Family Medicine

## 2018-11-15 DIAGNOSIS — G8929 Other chronic pain: Secondary | ICD-10-CM | POA: Diagnosis not present

## 2018-11-15 DIAGNOSIS — M25561 Pain in right knee: Secondary | ICD-10-CM | POA: Diagnosis not present

## 2018-11-15 DIAGNOSIS — M25562 Pain in left knee: Secondary | ICD-10-CM | POA: Diagnosis not present

## 2018-11-19 ENCOUNTER — Other Ambulatory Visit: Payer: Self-pay | Admitting: Family Medicine

## 2018-11-20 ENCOUNTER — Other Ambulatory Visit: Payer: Self-pay | Admitting: Family Medicine

## 2018-11-20 NOTE — Telephone Encounter (Signed)
Ok to refill??  Last office visit 10/24/2018.  Last refill 10/22/2018.

## 2018-11-24 ENCOUNTER — Other Ambulatory Visit: Payer: Self-pay | Admitting: Family Medicine

## 2018-11-27 ENCOUNTER — Other Ambulatory Visit: Payer: Self-pay | Admitting: Family Medicine

## 2018-12-04 ENCOUNTER — Other Ambulatory Visit: Payer: Self-pay | Admitting: Family Medicine

## 2018-12-07 ENCOUNTER — Other Ambulatory Visit: Payer: Self-pay | Admitting: Family Medicine

## 2018-12-12 ENCOUNTER — Other Ambulatory Visit: Payer: Self-pay | Admitting: Family Medicine

## 2018-12-13 ENCOUNTER — Other Ambulatory Visit: Payer: Self-pay | Admitting: Family Medicine

## 2018-12-17 ENCOUNTER — Other Ambulatory Visit: Payer: Self-pay | Admitting: Family Medicine

## 2018-12-18 ENCOUNTER — Other Ambulatory Visit: Payer: Self-pay

## 2018-12-18 MED ORDER — TRAMADOL HCL 50 MG PO TABS: 50.0000 mg | ORAL_TABLET | Freq: Two times a day (BID) | ORAL | 0 refills | Status: DC | PRN

## 2018-12-18 NOTE — Telephone Encounter (Signed)
Requested Prescriptions   Pending Prescriptions Disp Refills  . traMADol (ULTRAM) 50 MG tablet 60 tablet 0    Sig: Take 1 tablet (50 mg total) by mouth every 12 (twelve) hours as needed.    Last OV 10/24/2018  Last written 11/20/2018

## 2018-12-22 ENCOUNTER — Other Ambulatory Visit: Payer: Self-pay | Admitting: Family Medicine

## 2018-12-25 ENCOUNTER — Telehealth: Payer: Self-pay | Admitting: *Deleted

## 2018-12-25 ENCOUNTER — Other Ambulatory Visit: Payer: Self-pay | Admitting: Family Medicine

## 2018-12-25 NOTE — Telephone Encounter (Signed)
Received call from patient.   Reports that she would like to try medication to increase her libido. States that she has begun a new relationship where she would like to be intimate.   Advised that MD does not prescribe such medication. Requested to set up appointment to discuss next steps with MD. Will call back later to schedule.

## 2018-12-25 NOTE — Telephone Encounter (Signed)
noted 

## 2018-12-29 ENCOUNTER — Other Ambulatory Visit (HOSPITAL_COMMUNITY): Payer: Self-pay | Admitting: Psychiatry

## 2019-01-04 ENCOUNTER — Other Ambulatory Visit: Payer: Self-pay | Admitting: *Deleted

## 2019-01-04 MED ORDER — GABAPENTIN 600 MG PO TABS: 600.0000 mg | ORAL_TABLET | Freq: Three times a day (TID) | ORAL | 0 refills | Status: DC

## 2019-01-07 ENCOUNTER — Ambulatory Visit: Payer: Medicare Other | Admitting: Family Medicine

## 2019-01-08 ENCOUNTER — Other Ambulatory Visit: Payer: Self-pay | Admitting: Family Medicine

## 2019-01-22 ENCOUNTER — Other Ambulatory Visit: Payer: Self-pay | Admitting: Family Medicine

## 2019-01-22 NOTE — Telephone Encounter (Signed)
Ok to refill Tramadol??  Last office visit 10/24/2018.  Last refill 12/18/2018.

## 2019-01-23 ENCOUNTER — Telehealth: Payer: Self-pay | Admitting: *Deleted

## 2019-01-23 NOTE — Telephone Encounter (Signed)
I do not recommend increasing any higher due to her other medications Work on diet, healthy eating, any type of exercise which will help

## 2019-01-23 NOTE — Telephone Encounter (Signed)
Received call from patient.   Requested increase on gabapentin to 800mg  for her increawsed pain from fibromyalgia.   MD please advise.

## 2019-01-24 NOTE — Telephone Encounter (Signed)
Call placed to patient and patient made aware.  

## 2019-01-25 ENCOUNTER — Encounter (HOSPITAL_COMMUNITY): Payer: Self-pay | Admitting: Psychiatry

## 2019-01-25 ENCOUNTER — Other Ambulatory Visit: Payer: Self-pay

## 2019-01-25 ENCOUNTER — Ambulatory Visit (INDEPENDENT_AMBULATORY_CARE_PROVIDER_SITE_OTHER): Payer: Medicare Other | Admitting: Psychiatry

## 2019-01-25 DIAGNOSIS — F4310 Post-traumatic stress disorder, unspecified: Secondary | ICD-10-CM | POA: Diagnosis not present

## 2019-01-25 MED ORDER — ESCITALOPRAM OXALATE 10 MG PO TABS: 10.0000 mg | ORAL_TABLET | Freq: Every day | ORAL | 2 refills | Status: DC

## 2019-01-25 MED ORDER — LAMOTRIGINE 100 MG PO TABS: 100.0000 mg | ORAL_TABLET | Freq: Two times a day (BID) | ORAL | 2 refills | Status: DC

## 2019-01-25 MED ORDER — TRAZODONE HCL 150 MG PO TABS: 300.0000 mg | ORAL_TABLET | Freq: Every day | ORAL | 3 refills | Status: DC

## 2019-01-25 MED ORDER — ALPRAZOLAM 1 MG PO TABS: ORAL_TABLET | ORAL | 2 refills | Status: DC
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
## 2019-01-31 ENCOUNTER — Other Ambulatory Visit: Payer: Self-pay | Admitting: Family Medicine

## 2019-01-31 NOTE — Telephone Encounter (Signed)
Last seen: 10/24/2018 Last refilled: 01/04/2019

## 2019-02-14 ENCOUNTER — Other Ambulatory Visit: Payer: Self-pay | Admitting: Family Medicine

## 2019-02-18 ENCOUNTER — Other Ambulatory Visit: Payer: Self-pay | Admitting: Family Medicine

## 2019-02-18 ENCOUNTER — Other Ambulatory Visit (HOSPITAL_COMMUNITY): Payer: Self-pay | Admitting: Psychiatry

## 2019-02-18 NOTE — Telephone Encounter (Signed)
Requested Prescriptions   Pending Prescriptions Disp Refills  . traMADol (ULTRAM) 50 MG tablet [Pharmacy Med Name: TRAMADOL HCL 50 MG TABLET] 60 tablet 0    Sig: TAKE 1 TABLET BY MOUTH EVERY 12 HOURS AS NEEDED.   Signed Prescriptions Disp Refills  . ibuprofen (ADVIL) 800 MG tablet 60 tablet 0    Sig: TAKE ONE TABLET BY MOUTH 2 TIMES A DAY AS NEEDED FOR PAIN.    Authorizing Provider: Alycia Rossetti    Ordering User: Vanice Sarah    Last OV 09/04/2018  Last written 01/22/2019

## 2019-02-19 ENCOUNTER — Other Ambulatory Visit: Payer: Self-pay | Admitting: Family Medicine

## 2019-02-25 ENCOUNTER — Other Ambulatory Visit (HOSPITAL_COMMUNITY): Payer: Self-pay | Admitting: Psychiatry

## 2019-02-25 ENCOUNTER — Other Ambulatory Visit: Payer: Self-pay | Admitting: Family Medicine

## 2019-03-07 ENCOUNTER — Other Ambulatory Visit: Payer: Self-pay | Admitting: Family Medicine

## 2019-03-13 ENCOUNTER — Encounter: Payer: Self-pay | Admitting: Family Medicine

## 2019-03-13 ENCOUNTER — Other Ambulatory Visit: Payer: Self-pay | Admitting: Family Medicine

## 2019-03-13 ENCOUNTER — Ambulatory Visit (INDEPENDENT_AMBULATORY_CARE_PROVIDER_SITE_OTHER): Payer: Medicare Other | Admitting: Family Medicine

## 2019-03-13 ENCOUNTER — Other Ambulatory Visit: Payer: Self-pay

## 2019-03-13 VITALS — BP 136/72 | HR 88 | Temp 98.5°F | Resp 16 | Ht 64.0 in | Wt 300.0 lb

## 2019-03-13 DIAGNOSIS — F319 Bipolar disorder, unspecified: Secondary | ICD-10-CM | POA: Diagnosis not present

## 2019-03-13 DIAGNOSIS — Z114 Encounter for screening for human immunodeficiency virus [HIV]: Secondary | ICD-10-CM

## 2019-03-13 DIAGNOSIS — R7303 Prediabetes: Secondary | ICD-10-CM

## 2019-03-13 DIAGNOSIS — Z23 Encounter for immunization: Secondary | ICD-10-CM

## 2019-03-13 DIAGNOSIS — I10 Essential (primary) hypertension: Secondary | ICD-10-CM

## 2019-03-13 DIAGNOSIS — Z0001 Encounter for general adult medical examination with abnormal findings: Secondary | ICD-10-CM

## 2019-03-13 DIAGNOSIS — Z720 Tobacco use: Secondary | ICD-10-CM

## 2019-03-13 DIAGNOSIS — J019 Acute sinusitis, unspecified: Secondary | ICD-10-CM

## 2019-03-13 DIAGNOSIS — Z0000 Encounter for general adult medical examination without abnormal findings: Secondary | ICD-10-CM

## 2019-03-13 MED ORDER — CEFDINIR 300 MG PO CAPS: 300.0000 mg | ORAL_CAPSULE | Freq: Two times a day (BID) | ORAL | 0 refills | Status: DC

## 2019-03-13 NOTE — Patient Instructions (Addendum)
F/U 4 months  Flu shot given  Use the zyrtec, use flonase, take antibiotics  Get swabbed for COVID at [facility] 10am-3pm

## 2019-03-13 NOTE — Progress Notes (Signed)
Subjective:   Patient presents for Medicare Annual/Subsequent preventive examination.     Sinus drainage , ear pain, cough with congestion, HA, runny nose for the past 3 weeks, No fever. No body aches , SHe took ibuprofen. No Mucinex or robitussin.  She does have a nurse aide that comes to her home but then states that she has a friend that comes to visit her on and off and she has been out of the home some.    MDD/ GAD- she was recently given lexapro, she doesn't feel like it is helping   continued on lamictal and trazodone   HTN- controlled   Smoking only a few cig a month   OA- follows with Dr. Lorre Nick, getting shots     Review Past Medical/Family/Social: Per EMR    Risk Factors  Current exercise habits: none Dietary issues discussed: Yes  Cardiac risk factors:   Depression Screen  (Note: if answer to either of the following is "Yes", a more complete depression screening is indicated)  Over the past two weeks, have you felt down, depressed or hopeless? No Over the past two weeks, have you felt little interest or pleasure in doing things? No Have you lost interest or pleasure in daily life? No Do you often feel hopeless? No Do you cry easily over simple problems? No   Activities of Daily Living  In your present state of health, do you have any difficulty performing the following activities?:  Driving? No  Managing money? No  Feeding yourself? No  Getting from bed to chair? No  Climbing a flight of stairs? No  Preparing food and eating?: No  Bathing or showering? No  Getting dressed: No  Getting to the toilet? No  Using the toilet:No  Moving around from place to place: No  In the past year have you fallen or had a near fall?:No  Are you sexually active? No  Do you have more than one partner? No   Hearing Difficulties: No  Do you often ask people to speak up or repeat themselves? No  Do you experience ringing or noises in your ears? No Do you have difficulty  understanding soft or whispered voices? No  Do you feel that you have a problem with memory? No Do you often misplace items? No  Do you feel safe at home? Yes  Cognitive Testing  Alert? Yes Normal Appearance?Yes  Oriented to person? Yes Place? Yes  Time? Yes  Recall of three objects? Yes  Can perform simple calculations? Yes  Displays appropriate judgment?Yes  Can read the correct time from a watch face?Yes   List the Names of Other Physician/Practitioners you currently use:   Dr. Harrington Challenger- Psychiatry , orthopedics ., Endocrinology    Screening Tests / Date Colonoscopy  UTD                    Zostavax - Defer to  2021 Mammogram - declines  Influenza Vaccine - Due  Tetanus/tdapUTD Pneumonia- UTD  ROS: GEN- denies fatigue, fever, weight loss,weakness, recent illness HEENT- denies eye drainage, change in vision, +nasal discharge, CVS- denies chest pain, palpitations RESP- denies SOB, cough, wheeze ABD- denies N/V, change in stools, abd pain GU- denies dysuria, hematuria, dribbling, incontinence MSK- + joint pain, muscle aches, injury Neuro- denies headache, dizziness, syncope, seizure activity  Physical: VSS  GEN- NAD, alert and oriented x3 HEENT- PERRL, EOMI, non injected sclera, pink conjunctiva, MMM, oropharynx clear, tenderness "maxillary sinuses right greater than left enlarged  turbinates TM clear bilaterally no effusion Neck- Supple, no thryomegaly, shotty lymphadenopathy CVS- RRR, no murmur RESP-CTAB Abdomen NABS soft nontender she had bruises on the abdomen as well as her left upper arm  EXT- No edema Pulses- Radial, DP- 2+     Assessment:    Annual wellness medicare exam   Plan:    During the course of the visit the patient was educated and counseled about appropriate screening and preventive services including:  Screening mammography -patient declines  She has had sinusitis symptoms for the past 3 weeks.  While I do think she has bacterial sinus infection  she is high risk for Covid as she has been around multiple people.  Recommend that she go ahead and get tested.  She does not have any fever or respiratory compromise I will go ahead and give her her flu shot as she is high risk for influenza complications as well. We will treat her with Carole Civil, Flonase, Zyrtec  Bipolar depression followed by psychiatry they are managing her medications.  Osteoarthritis I discussed her weight and how this affects her joints.  Tramadol as needed./ Volatren gel   Hypertension blood pressure controlled with losartan no changes  Starting the bruises she states that she had been in an old [employer] for "pressure" and something fell on her she did not injure herself.  Fall/audit C negative   Clinical hypothyroidism followed by endocrinology       Diet review for nutrition referral? Yes ____ Not Indicated __x__  Patient Instructions (the written plan) was given to the patient.  Medicare Attestation  I have personally reviewed:  The patient's medical and social history  Their use of alcohol, tobacco or illicit drugs  Their current medications and supplements  The patient's functional ability including ADLs,fall risks, home safety risks, cognitive, and hearing and visual impairment  Diet and physical activities  Evidence for depression or mood disorders  The patient's weight, height, BMI, and visual acuity have been recorded in the chart. I have made referrals, counseling, and provided education to the patient based on review of the above and I have provided the patient with a written personalized care plan for preventive services.

## 2019-03-14 LAB — COMPREHENSIVE METABOLIC PANEL
AG Ratio: 1.3 (calc) (ref 1.0–2.5)
ALT: 16 U/L (ref 6–29)
AST: 19 U/L (ref 10–35)
Albumin: 4.1 g/dL (ref 3.6–5.1)
Alkaline phosphatase (APISO): 72 U/L (ref 37–153)
BUN: 14 mg/dL (ref 7–25)
CO2: 28 mmol/L (ref 20–32)
Calcium: 9.1 mg/dL (ref 8.6–10.4)
Chloride: 103 mmol/L (ref 98–110)
Creat: 0.85 mg/dL (ref 0.50–1.05)
Globulin: 3.1 g/dL (calc) (ref 1.9–3.7)
Glucose, Bld: 87 mg/dL (ref 65–99)
Potassium: 4.4 mmol/L (ref 3.5–5.3)
Sodium: 140 mmol/L (ref 135–146)
Total Bilirubin: 0.3 mg/dL (ref 0.2–1.2)
Total Protein: 7.2 g/dL (ref 6.1–8.1)

## 2019-03-14 LAB — CBC WITH DIFFERENTIAL/PLATELET
Absolute Monocytes: 421 cells/uL (ref 200–950)
Basophils Absolute: 10 cells/uL (ref 0–200)
Basophils Relative: 0.2 %
Eosinophils Absolute: 68 cells/uL (ref 15–500)
Eosinophils Relative: 1.3 %
HCT: 43.9 % (ref 35.0–45.0)
Hemoglobin: 14.2 g/dL (ref 11.7–15.5)
Lymphs Abs: 1628 cells/uL (ref 850–3900)
MCH: 29.5 pg (ref 27.0–33.0)
MCHC: 32.3 g/dL (ref 32.0–36.0)
MCV: 91.3 fL (ref 80.0–100.0)
MPV: 9.6 fL (ref 7.5–12.5)
Monocytes Relative: 8.1 %
Neutro Abs: 3073 cells/uL (ref 1500–7800)
Neutrophils Relative %: 59.1 %
Platelets: 286 10*3/uL (ref 140–400)
RBC: 4.81 10*6/uL (ref 3.80–5.10)
RDW: 12.1 % (ref 11.0–15.0)
Total Lymphocyte: 31.3 %
WBC: 5.2 10*3/uL (ref 3.8–10.8)

## 2019-03-14 LAB — HEMOGLOBIN A1C
Hgb A1c MFr Bld: 5.4 % of total Hgb (ref <5.7)
Mean Plasma Glucose: 108 (calc)
eAG (mmol/L): 6 (calc)

## 2019-03-14 LAB — LIPID PANEL
Cholesterol: 187 mg/dL (ref <200)
HDL: 60 mg/dL (ref >=50)
LDL Cholesterol (Calc): 100 mg/dL (calc) — ABNORMAL HIGH
Non-HDL Cholesterol (Calc): 127 mg/dL (calc) (ref <130)
Total CHOL/HDL Ratio: 3.1 (calc) (ref <5.0)
Triglycerides: 168 mg/dL — ABNORMAL HIGH (ref <150)

## 2019-03-15 LAB — HIV ANTIBODY (ROUTINE TESTING W REFLEX): HIV 1&2 Ab, 4th Generation: NONREACTIVE

## 2019-03-21 ENCOUNTER — Other Ambulatory Visit: Payer: Self-pay | Admitting: Family Medicine

## 2019-03-21 DIAGNOSIS — Z1231 Encounter for screening mammogram for malignant neoplasm of breast: Secondary | ICD-10-CM

## 2019-03-27 ENCOUNTER — Other Ambulatory Visit: Payer: Self-pay | Admitting: Family Medicine

## 2019-03-27 MED ORDER — LOSARTAN POTASSIUM 50 MG PO TABS: 50.0000 mg | ORAL_TABLET | Freq: Every day | ORAL | 1 refills | Status: DC

## 2019-03-27 NOTE — Telephone Encounter (Signed)
Last office visit: 03/13/2019 Last refilled: 01/25/2019

## 2019-03-28 ENCOUNTER — Telehealth: Payer: Self-pay | Admitting: *Deleted

## 2019-03-28 ENCOUNTER — Other Ambulatory Visit: Payer: Self-pay | Admitting: Family Medicine

## 2019-03-28 DIAGNOSIS — Z1231 Encounter for screening mammogram for malignant neoplasm of breast: Secondary | ICD-10-CM

## 2019-03-28 NOTE — Telephone Encounter (Signed)
Received call from patient.   Reports that she is willing to have breast cancer screening, but states that PCP had discussed ultrasound instead of traditional mammogram due to pain in implant.   MD please advise.

## 2019-03-29 ENCOUNTER — Other Ambulatory Visit (HOSPITAL_COMMUNITY): Payer: Self-pay | Admitting: Family Medicine

## 2019-03-29 DIAGNOSIS — Z1231 Encounter for screening mammogram for malignant neoplasm of breast: Secondary | ICD-10-CM

## 2019-03-29 NOTE — Telephone Encounter (Signed)
Call placed to patient. LMTRC.  

## 2019-03-29 NOTE — Telephone Encounter (Signed)
Mammogram for patient with implants ordered She can call [facility] to schedule

## 2019-03-29 NOTE — Telephone Encounter (Signed)
Patient returned call and made aware.

## 2019-04-02 ENCOUNTER — Other Ambulatory Visit (HOSPITAL_COMMUNITY): Payer: Self-pay | Admitting: Family Medicine

## 2019-04-02 DIAGNOSIS — R928 Other abnormal and inconclusive findings on diagnostic imaging of breast: Secondary | ICD-10-CM

## 2019-04-03 ENCOUNTER — Other Ambulatory Visit: Payer: Self-pay

## 2019-04-03 ENCOUNTER — Ambulatory Visit (INDEPENDENT_AMBULATORY_CARE_PROVIDER_SITE_OTHER): Payer: Medicare Other | Admitting: Psychiatry

## 2019-04-03 ENCOUNTER — Encounter (HOSPITAL_COMMUNITY): Payer: Self-pay | Admitting: Psychiatry

## 2019-04-03 DIAGNOSIS — F419 Anxiety disorder, unspecified: Secondary | ICD-10-CM

## 2019-04-03 DIAGNOSIS — F4310 Post-traumatic stress disorder, unspecified: Secondary | ICD-10-CM

## 2019-04-03 DIAGNOSIS — H5213 Myopia, bilateral: Secondary | ICD-10-CM | POA: Diagnosis not present

## 2019-04-03 MED ORDER — TRAZODONE HCL 150 MG PO TABS: 300.0000 mg | ORAL_TABLET | Freq: Every day | ORAL | 3 refills | Status: DC

## 2019-04-03 MED ORDER — ESCITALOPRAM OXALATE 20 MG PO TABS: 20.0000 mg | ORAL_TABLET | Freq: Every day | ORAL | 2 refills | Status: DC

## 2019-04-03 MED ORDER — LAMOTRIGINE 100 MG PO TABS: 100.0000 mg | ORAL_TABLET | Freq: Two times a day (BID) | ORAL | 2 refills | Status: DC

## 2019-04-03 MED ORDER — ALPRAZOLAM 1 MG PO TABS: ORAL_TABLET | ORAL | 2 refills | Status: DC
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
## 2019-04-05 ENCOUNTER — Ambulatory Visit (HOSPITAL_COMMUNITY): Payer: Medicare Other

## 2019-04-16 ENCOUNTER — Other Ambulatory Visit: Payer: Self-pay | Admitting: Family Medicine

## 2019-04-17 NOTE — Telephone Encounter (Signed)
Ok to refill??  Last office visit 03/13/2019.  Last refill 03/27/2019.

## 2019-04-24 ENCOUNTER — Other Ambulatory Visit: Payer: Self-pay | Admitting: Family Medicine

## 2019-04-24 NOTE — Telephone Encounter (Signed)
Ok to refill??  Last office visit 03/13/2019.  Last refill 03/27/2019.  Ok to add refills to prescription?

## 2019-04-27 ENCOUNTER — Other Ambulatory Visit: Payer: Self-pay | Admitting: Family Medicine

## 2019-04-30 ENCOUNTER — Other Ambulatory Visit: Payer: Self-pay | Admitting: Family Medicine

## 2019-04-30 DIAGNOSIS — H524 Presbyopia: Secondary | ICD-10-CM | POA: Diagnosis not present

## 2019-04-30 DIAGNOSIS — H52223 Regular astigmatism, bilateral: Secondary | ICD-10-CM | POA: Diagnosis not present

## 2019-05-05 ENCOUNTER — Other Ambulatory Visit: Payer: Self-pay

## 2019-05-05 ENCOUNTER — Emergency Department (HOSPITAL_COMMUNITY)
Admission: EM | Admit: 2019-05-05 | Discharge: 2019-05-05 | Disposition: A | Discharge status: Home / Self Care | Payer: Medicare Other | Attending: Emergency Medicine | Admitting: Emergency Medicine

## 2019-05-05 ENCOUNTER — Telehealth (HOSPITAL_COMMUNITY): Payer: Self-pay | Admitting: Physician Assistant

## 2019-05-05 DIAGNOSIS — J020 Streptococcal pharyngitis: Secondary | ICD-10-CM | POA: Insufficient documentation

## 2019-05-05 DIAGNOSIS — I10 Essential (primary) hypertension: Secondary | ICD-10-CM | POA: Diagnosis not present

## 2019-05-05 DIAGNOSIS — Z20822 Contact with and (suspected) exposure to covid-19: Secondary | ICD-10-CM | POA: Diagnosis not present

## 2019-05-05 DIAGNOSIS — J029 Acute pharyngitis, unspecified: Secondary | ICD-10-CM

## 2019-05-05 DIAGNOSIS — Z79899 Other long term (current) drug therapy: Secondary | ICD-10-CM | POA: Diagnosis not present

## 2019-05-05 DIAGNOSIS — Z91040 Latex allergy status: Secondary | ICD-10-CM | POA: Diagnosis not present

## 2019-05-05 DIAGNOSIS — F17210 Nicotine dependence, cigarettes, uncomplicated: Secondary | ICD-10-CM | POA: Insufficient documentation

## 2019-05-05 LAB — GROUP A STREP BY PCR: Group A Strep by PCR: DETECTED — AB

## 2019-05-05 MED ORDER — LIDOCAINE VISCOUS HCL 2 % MT SOLN: 15.0000 mL | OROMUCOSAL | 0 refills | Status: AC | PRN

## 2019-05-05 MED ORDER — CLINDAMYCIN HCL 150 MG PO CAPS: 300.0000 mg | ORAL_CAPSULE | Freq: Three times a day (TID) | ORAL | 0 refills | Status: DC

## 2019-05-05 NOTE — Telephone Encounter (Signed)
I called and spoke directly to the patient and advised her that her strep test was positive and that we have called in antibiotics to her pharmacy.  Patient verbalized understanding.

## 2019-05-05 NOTE — ED Provider Notes (Signed)
[facility] EMERGENCY DEPARTMENT Provider Note   CSN: [id number] Arrival date & time: 05/05/19  [id number]     History Chief Complaint  Patient presents with  . Sore Throat    times 3 days    Annette Galvan is a 59 y.o. female.  Patient is a 59 year old female with past medical history of arthritis, bipolar disorder, fibromyalgia presenting to the emergency department for sore throat.  Reports sore throat for 3 days.  Denies trouble breathing, swallowing, fever, chills, nausea, vomiting, URI symptoms.  Reports that the pain radiates into her right ear.  Denies any ear discharge.  Reports decreased appetite.  No known sick contacts.        Past Medical History:  Diagnosis Date  . Arthritis    needs 2 knee replacements  . Back pain, chronic   . Bilateral chronic knee pain   . Bipolar 1 disorder ([place])   . Bulging disc   . Fibromyalgia   . GERD (gastroesophageal reflux disease)   . HSV-2 (herpes simplex virus 2) infection 2013  . Hx of trichomoniasis   . Itching 06/26/2013  . Leg pain   . Trichimoniasis 06/26/2013  . UTI (lower urinary tract infection) 10/12/2012  . Vaginal discharge 06/26/2013  . Vaginal irritation 12/05/2013  . Vaginal odor 12/28/2012   Had discharge +clue  . Yeast infection 02/04/2013    Patient Active Problem List   Diagnosis Date Noted  . Candidal intertrigo 01/09/2017  . Polypharmacy 11/13/2015  . Subclinical hyperthyroidism 08/14/2015  . GERD (gastroesophageal reflux disease) 11/12/2014  . Breast asymmetry between native breast and reconstructed breast 05/26/2014  . Inversion deformity of right foot 05/26/2014  . OA (osteoarthritis) of knee 05/17/2013  . Encounter for screening colonoscopy 12/26/2012  . Sleep apnea 09/13/2012  . Tobacco use 09/13/2012  . Hypertension 02/10/2012  . Fibromyalgia 10/11/2011  . Morbid obesity ([place]) 08/29/2011  . Borderline diabetic 08/29/2011  . Bipolar 1 disorder ([place]) 08/29/2011  . Difficulty in walking(719.7)  05/10/2011  . DDD (degenerative disc disease), lumbar 04/04/2007    Past Surgical History:  Procedure Laterality Date  . ABDOMINAL HYSTERECTOMY    . BREAST ENHANCEMENT SURGERY Right   . COLONOSCOPY WITH PROPOFOL N/A 12/07/2015   Procedure: COLONOSCOPY WITH PROPOFOL;  Surgeon: Daneil Dolin, MD;  Location: [facility];  Service: Endoscopy;  Laterality: N/A;  915  . TUBAL LIGATION       OB History    Gravida  2   Para  2   Term      Preterm      AB      Living  2     SAB      TAB      Ectopic      Multiple      Live Births  2           Family History  Problem Relation Age of Onset  . Hypertension Mother   . Diabetes Mother   . Depression Mother   . Hyperlipidemia Mother   . Fibromyalgia Mother   . Arthritis Mother   . Cancer Father        bone  . Hyperlipidemia Father   . Hypertension Father   . Bipolar disorder Father   . Depression Sister   . Hyperlipidemia Sister   . Fibromyalgia Sister   . Fibromyalgia Sister   . Bipolar disorder Other   . Drug abuse Other   . Alcohol abuse Other   .  Hypertension Brother   . Other Brother        shingles; back problems  . Diabetes Paternal Grandmother   . Alzheimer's disease Maternal Grandmother   . Obesity Daughter   . Other Daughter        back problems  . Bipolar disorder Daughter   . Depression Daughter   . Other Daughter        on pain meds  . Colon cancer Neg Hx     Social History   Tobacco Use  . Smoking status: Light Tobacco Smoker    Packs/day: 0.25    Years: 25.00    Pack years: 6.25    Types: Cigarettes  . Smokeless tobacco: Never Used  . Tobacco comment: 2 cigarettes daily  Substance Use Topics  . Alcohol use: Yes    Alcohol/week: 0.0 standard drinks    Comment: occasional  . Drug use: No    Comment: cocaine, clean for 4.5 years as of 12/04/2015    Home Medications Prior to Admission medications   Medication Sig Start Date End Date Taking? Authorizing Provider  ALPRAZolam  (XANAX) 1 MG tablet TAKE (1) TABLET BY MOUTH (4) TIMES DAILY AS NEEDED FOR ANXIETY. 04/03/19   Cloria Spring, MD  cefdinir (OMNICEF) 300 MG capsule Take 1 capsule (300 mg total) by mouth 2 (two) times daily. 03/13/19   Alycia Rossetti, MD  cetirizine (ZYRTEC) 10 MG tablet TAKE 1 TABLET BY MOUTH ONCE DAILY. 02/25/19   [place], Modena Nunnery, MD  diclofenac sodium (VOLTAREN) 1 % GEL APPLY 2 GRAMS TO THE AFFECTED AREAS FOUR TIMES A DAY AS DIRECTED. 11/12/18   Alycia Rossetti, MD  diclofenac Sodium (VOLTAREN) 1 % GEL APPLY 2 GRAMS TO THE AFFECTED AREAS FOUR TIMES A DAY AS DIRECTED. 04/29/19   Alycia Rossetti, MD  escitalopram (LEXAPRO) 20 MG tablet Take 1 tablet (20 mg total) by mouth daily. 04/03/19 04/02/20  Cloria Spring, MD  fluticasone (FLONASE) 50 MCG/ACT nasal spray INSTILL 2 SPRAYS IN EACH NOSTRIL TWICE DAILY AS DIRECTED. 04/17/19   Alycia Rossetti, MD  gabapentin (NEURONTIN) 600 MG tablet TAKE 1 TABLET BY MOUTH THREE TIMES DAILY. 04/17/19   Alycia Rossetti, MD  ibuprofen (ADVIL) 800 MG tablet TAKE ONE TABLET BY MOUTH 2 TIMES A DAY AS NEEDED FOR PAIN. 04/30/19   Alycia Rossetti, MD  lamoTRIgine (LAMICTAL) 100 MG tablet Take 1 tablet (100 mg total) by mouth 2 (two) times daily. 04/03/19   Cloria Spring, MD  lidocaine (XYLOCAINE) 2 % solution Use as directed 15 mLs in the mouth or throat as needed for up to 5 days for mouth pain. 05/05/19 05/10/19  Alveria Apley, PA-C  losartan (COZAAR) 50 MG tablet Take 1 tablet (50 mg total) by mouth daily. 03/27/19   Alycia Rossetti, MD  nystatin (MYCOSTATIN/NYSTOP) powder APPLY TO THE AFFECTED AREAS FOUR TIMES DAILY AS DIRECTED. 03/07/19   Alycia Rossetti, MD  omeprazole (PRILOSEC) 40 MG capsule TAKE (1) CAPSULE BY MOUTH ONCE DAILY FOR ACID REFLUX. 11/26/18   [place], Modena Nunnery, MD  ondansetron (ZOFRAN) 4 MG tablet Take 1 tablet (4 mg total) by mouth every 6 (six) hours. 12/24/17   Triplett, Tammy, PA-C  traMADol (ULTRAM) 50 MG tablet TAKE 1 TABLET BY  MOUTH EVERY 12 HOURS AS NEEDED. 04/24/19   Alycia Rossetti, MD  traZODone (DESYREL) 150 MG tablet Take 2 tablets (300 mg total) by mouth at bedtime. 04/03/19   Cloria Spring,  MD  valACYclovir (VALTREX) 1000 MG tablet TAKE (1) TABLET BY MOUTH TWICE DAILY. 05/26/17   Alycia Rossetti, MD    Allergies    Amoxicillin, Hydrocodone, Latex, Lisinopril, and Ampicillin  Review of Systems   Review of Systems  Constitutional: Positive for appetite change. Negative for diaphoresis, fatigue and fever.  HENT: Positive for ear pain and sore throat. Negative for congestion, dental problem, facial swelling, postnasal drip, rhinorrhea, sinus pressure and sinus pain.   Eyes: Negative for visual disturbance.  Respiratory: Negative for cough and shortness of breath.   Cardiovascular: Negative for chest pain.  Gastrointestinal: Negative for abdominal pain, diarrhea, nausea and vomiting.  Genitourinary: Negative for dysuria.  Musculoskeletal: Negative for myalgias.  Allergic/Immunologic: Negative for immunocompromised state.  Neurological: Negative for headaches.    Physical Exam Updated Vital Signs BP (!) 147/60 (BP Location: Right Arm)   Pulse 97   Temp 98.2 F (36.8 C) (Oral)   Resp 18   Ht 5\' 4"  (1.626 m)   Wt (!) 140.6 kg   SpO2 96%   BMI 53.21 kg/m   Physical Exam Vitals and nursing note reviewed.  Constitutional:      Appearance: Normal appearance.  HENT:     Head: Normocephalic.     Right Ear: Tympanic membrane normal.     Left Ear: Tympanic membrane normal.     Mouth/Throat:     Mouth: Mucous membranes are moist. No oral lesions.     Pharynx: Oropharynx is clear. Posterior oropharyngeal erythema present. No pharyngeal swelling, oropharyngeal exudate or uvula swelling.     Tonsils: No tonsillar exudate or tonsillar abscesses.  Eyes:     Conjunctiva/sclera: Conjunctivae normal.     Pupils: Pupils are equal, round, and reactive to light.  Cardiovascular:     Rate and Rhythm: Normal  rate and regular rhythm.  Pulmonary:     Effort: Pulmonary effort is normal.     Breath sounds: Normal breath sounds.  Musculoskeletal:     Cervical back: Normal range of motion and neck supple.  Lymphadenopathy:     Cervical: No cervical adenopathy.  Skin:    General: Skin is dry.  Neurological:     Mental Status: She is alert.  Psychiatric:        Mood and Affect: Mood normal.     ED Results / Procedures / Treatments   Labs (all labs ordered are listed, but only abnormal results are displayed) Labs Reviewed  GROUP A STREP BY PCR  SARS CORONAVIRUS 2 (TAT 6-24 HRS)    EKG None  Radiology No results found.  Procedures Procedures (including critical care time)  Medications Ordered in ED Medications - No data to display  ED Course  I have reviewed the triage vital signs and the nursing notes.  Pertinent labs & imaging results that were available during my care of the patient were reviewed by me and considered in my medical decision making (see chart for details).  Clinical Course as of May 04 1014  Sun May 05, 2019  1016 Patient with pharyngitis for 3 days.  No airway compromise is, tonsillar abscess.  Patient appears very well.  She is tested for strep and Covid.  Patient reports that she did not want to stay here for the strep results and wants to go home and "take a shower".  Advised patient we will call her with any positive results and she should quarantine until she knows her results are negative.  Prescribed viscous lidocaine for relief.  Advised her on return precautions.   [KM]    Clinical Course User Index [KM] Kristine Royal   MDM Rules/Calculators/A&P                      Based on review of vitals, medical screening exam, lab work and/or imaging, there does not appear to be an acute, emergent etiology for the patient's symptoms. Counseled pt on good return precautions and encouraged both PCP and ED follow-up as needed.  Prior to discharge, I also  discussed incidental imaging findings with patient in detail and advised appropriate, recommended follow-up in detail.  Clinical Impression: 1. Pharyngitis, unspecified etiology     Disposition: Discharge  Prior to providing a prescription for a controlled substance, I independently reviewed the patient's recent prescription history on the [facility]. The patient had no recent or regular prescriptions and was deemed appropriate for a brief, less than 3 day prescription of narcotic for acute analgesia.  This note was prepared with assistance of Systems analyst. Occasional wrong-word or sound-a-like substitutions may have occurred due to the inherent limitations of voice recognition software.  Final Clinical Impression(s) / ED Diagnoses Final diagnoses:  Pharyngitis, unspecified etiology    Rx / DC Orders ED Discharge Orders         Ordered    lidocaine (XYLOCAINE) 2 % solution  As needed     05/05/19 1012           Kristine Royal 05/05/19 1017    Milton Ferguson, MD 05/05/19 1235

## 2019-05-05 NOTE — Discharge Instructions (Signed)
You are seen today for sore throat.  The most likely cause of this is due to a virus.  Sometimes this can also be caused from a bacteria called strep.  Additionally, less common causes of it could be from allergies or snoring at nighttime.  I have given you a prescription that will help with the irritation in your throat.  If your strep test comes back positive then we will call you in an antibiotic.  Stay home and quarantine yourself until you know your results are negative. Thank you for allowing me to care for you today. Please return to the emergency department if you have new or worsening symptoms. Take your medications as instructed.

## 2019-05-05 NOTE — ED Triage Notes (Signed)
C/o sore throat times 3 days, rating pain 8/10.  Denies any fever or chills.

## 2019-05-05 NOTE — ED Triage Notes (Signed)
Pt reports having sinus infection about 2 months ago, treated with antibiotics.

## 2019-05-06 LAB — SARS CORONAVIRUS 2 (TAT 6-24 HRS): SARS Coronavirus 2: NEGATIVE

## 2019-05-08 ENCOUNTER — Telehealth: Payer: Self-pay

## 2019-05-08 MED ORDER — CEFDINIR 300 MG PO CAPS: 300.0000 mg | ORAL_CAPSULE | Freq: Two times a day (BID) | ORAL | 0 refills | Status: DC

## 2019-05-08 NOTE — Telephone Encounter (Signed)
Pt notified. Verbalizes understanding and was very grateful.

## 2019-05-08 NOTE — Telephone Encounter (Signed)
[facility] called and stated that pt has recently tested positive for strep and was prescribed clindamycin in which it is giving her diarrhea. Pt would like a different Rx sent to pharmacy. Please advise.

## 2019-05-08 NOTE — Telephone Encounter (Signed)
D/C the clindamycin  I have sent omnicef, take BID for 7 days

## 2019-05-13 ENCOUNTER — Telehealth: Payer: Self-pay | Admitting: Family Medicine

## 2019-05-13 MED ORDER — FLUCONAZOLE 150 MG PO TABS: ORAL_TABLET | ORAL | 0 refills | Status: DC

## 2019-05-13 NOTE — Telephone Encounter (Signed)
Spoke with patient and informed her that Dr. Buelah Manis sent in Juncal. Patient verbalized understanding.

## 2019-05-13 NOTE — Telephone Encounter (Signed)
Patient left voicemail with c/o yeast infection post two antibiotics. Patient stated that she is having itching with discharge. Would like to know if something can be called in for her?

## 2019-05-15 ENCOUNTER — Other Ambulatory Visit: Payer: Self-pay | Admitting: Family Medicine

## 2019-05-28 ENCOUNTER — Telehealth: Payer: Self-pay | Admitting: *Deleted

## 2019-05-28 MED ORDER — FLUCONAZOLE 150 MG PO TABS: ORAL_TABLET | ORAL | 0 refills | Status: DC

## 2019-05-28 MED ORDER — CEFDINIR 300 MG PO CAPS: 300.0000 mg | ORAL_CAPSULE | Freq: Two times a day (BID) | ORAL | 0 refills | Status: DC

## 2019-05-28 NOTE — Telephone Encounter (Signed)
Let patient know I have sent in 5 more days of antibiotics.  If her sore throat does not improve she needs office visit next week

## 2019-05-28 NOTE — Telephone Encounter (Signed)
Call placed to patient and patient made aware.   Patient requested diflucan as she does frequently get yeast infections after ABTx. Prescription sent to pharmacy.

## 2019-05-28 NOTE — Telephone Encounter (Signed)
Received call from patient.   Reports that she continues to have sore throat and feels like the strep was not completely treated. Requested MD to extend ABTx.  Denies fever, cough, SOB.  MD please advise.

## 2019-05-28 NOTE — Telephone Encounter (Signed)
Received call from patient.   Reports that she received call from [facility] worker stating that prescription for shower chair, grab bars, and handheld shower head was misplaced. Requested to have another prescription faxed to [facility]   Ok to order?

## 2019-05-28 NOTE — Addendum Note (Signed)
Addended by: Sheral Flow on: 05/28/2019 02:05 PM   Modules accepted: Orders

## 2019-05-28 NOTE — Telephone Encounter (Signed)
Order written

## 2019-06-14 ENCOUNTER — Other Ambulatory Visit (HOSPITAL_COMMUNITY): Payer: Self-pay | Admitting: Psychiatry

## 2019-06-24 ENCOUNTER — Other Ambulatory Visit: Payer: Self-pay | Admitting: Family Medicine

## 2019-07-01 ENCOUNTER — Other Ambulatory Visit (HOSPITAL_COMMUNITY): Payer: Self-pay | Admitting: Psychiatry

## 2019-07-03 ENCOUNTER — Ambulatory Visit (INDEPENDENT_AMBULATORY_CARE_PROVIDER_SITE_OTHER): Payer: Medicare Other | Admitting: Psychiatry

## 2019-07-03 ENCOUNTER — Other Ambulatory Visit: Payer: Self-pay

## 2019-07-03 ENCOUNTER — Encounter (HOSPITAL_COMMUNITY): Payer: Self-pay | Admitting: Psychiatry

## 2019-07-03 DIAGNOSIS — F4310 Post-traumatic stress disorder, unspecified: Secondary | ICD-10-CM | POA: Diagnosis not present

## 2019-07-03 MED ORDER — ALPRAZOLAM 1 MG PO TABS: ORAL_TABLET | ORAL | 2 refills | Status: DC

## 2019-07-03 MED ORDER — ESCITALOPRAM OXALATE 20 MG PO TABS: 20.0000 mg | ORAL_TABLET | Freq: Every day | ORAL | 2 refills | Status: DC

## 2019-07-03 MED ORDER — LAMOTRIGINE 100 MG PO TABS: 100.0000 mg | ORAL_TABLET | Freq: Two times a day (BID) | ORAL | 2 refills | Status: DC

## 2019-07-03 MED ORDER — TRAZODONE HCL 150 MG PO TABS: 300.0000 mg | ORAL_TABLET | Freq: Every day | ORAL | 3 refills | Status: DC
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
## 2019-07-09 ENCOUNTER — Other Ambulatory Visit: Payer: Self-pay | Admitting: Family Medicine

## 2019-07-23 ENCOUNTER — Other Ambulatory Visit: Payer: Self-pay | Admitting: Family Medicine

## 2019-07-23 NOTE — Telephone Encounter (Signed)
Ok to refill Tramadol??  Last office visit 03/13/2019.  Last refill 04/24/2019, #3 refills.

## 2019-07-24 ENCOUNTER — Other Ambulatory Visit: Payer: Self-pay | Admitting: Family Medicine

## 2019-07-31 ENCOUNTER — Other Ambulatory Visit: Payer: Self-pay | Admitting: Family Medicine

## 2019-08-02 ENCOUNTER — Other Ambulatory Visit: Payer: Self-pay | Admitting: *Deleted

## 2019-08-02 MED ORDER — IBUPROFEN 800 MG PO TABS: 800.0000 mg | ORAL_TABLET | Freq: Two times a day (BID) | ORAL | 0 refills | Status: DC | PRN

## 2019-09-03 ENCOUNTER — Other Ambulatory Visit: Payer: Self-pay | Admitting: Family Medicine

## 2019-09-09 ENCOUNTER — Other Ambulatory Visit: Payer: Self-pay | Admitting: Family Medicine

## 2019-09-10 ENCOUNTER — Other Ambulatory Visit (HOSPITAL_COMMUNITY): Payer: Self-pay | Admitting: Psychiatry

## 2019-09-17 ENCOUNTER — Other Ambulatory Visit: Payer: Self-pay | Admitting: Family Medicine

## 2019-09-17 NOTE — Telephone Encounter (Signed)
Ok to refill??  Last office visit 03/13/2019.  Last refill on tramadol 07/23/2019.

## 2019-10-03 ENCOUNTER — Other Ambulatory Visit: Payer: Self-pay

## 2019-10-03 ENCOUNTER — Encounter (HOSPITAL_COMMUNITY): Payer: Self-pay | Admitting: Psychiatry

## 2019-10-03 ENCOUNTER — Telehealth (INDEPENDENT_AMBULATORY_CARE_PROVIDER_SITE_OTHER): Payer: Medicare Other | Admitting: Psychiatry

## 2019-10-03 DIAGNOSIS — F4310 Post-traumatic stress disorder, unspecified: Secondary | ICD-10-CM | POA: Diagnosis not present

## 2019-10-03 MED ORDER — ESCITALOPRAM OXALATE 20 MG PO TABS: 30.0000 mg | ORAL_TABLET | Freq: Every day | ORAL | 2 refills | Status: DC

## 2019-10-03 MED ORDER — TRAZODONE HCL 150 MG PO TABS: 300.0000 mg | ORAL_TABLET | Freq: Every day | ORAL | 3 refills | Status: DC

## 2019-10-03 MED ORDER — LAMOTRIGINE 100 MG PO TABS: 100.0000 mg | ORAL_TABLET | Freq: Two times a day (BID) | ORAL | 2 refills | Status: DC

## 2019-10-03 MED ORDER — ALPRAZOLAM 1 MG PO TABS: ORAL_TABLET | ORAL | 2 refills | Status: DC
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
## 2019-10-14 ENCOUNTER — Other Ambulatory Visit: Payer: Self-pay | Admitting: Family Medicine

## 2019-10-15 ENCOUNTER — Other Ambulatory Visit: Payer: Self-pay | Admitting: Family Medicine

## 2019-10-16 ENCOUNTER — Other Ambulatory Visit: Payer: Self-pay | Admitting: Family Medicine

## 2019-10-22 ENCOUNTER — Ambulatory Visit: Payer: Medicare Other | Admitting: Family Medicine

## 2019-10-26 ENCOUNTER — Other Ambulatory Visit: Payer: Self-pay | Admitting: Family Medicine

## 2019-10-31 ENCOUNTER — Other Ambulatory Visit: Payer: Self-pay | Admitting: Family Medicine

## 2019-11-04 ENCOUNTER — Other Ambulatory Visit: Payer: Self-pay | Admitting: Family Medicine

## 2019-11-11 ENCOUNTER — Other Ambulatory Visit: Payer: Self-pay | Admitting: Family Medicine

## 2019-11-14 ENCOUNTER — Other Ambulatory Visit: Payer: Self-pay | Admitting: Family Medicine

## 2019-11-14 NOTE — Telephone Encounter (Signed)
Ok to refill??  Last office visit 03/13/2019.  Last refill 10/14/2019.  Patient has F/U on 12/02/2019.

## 2019-11-22 ENCOUNTER — Telehealth: Payer: Self-pay | Admitting: Family Medicine

## 2019-11-22 NOTE — Progress Notes (Signed)
  Chronic Care Management   Note  11/22/2019 Name: Annette Galvan MRN: [id number] DOB: June 02, 1960  Annette Galvan is a 59 y.o. year old female who is a primary care patient of Cornish, Modena Nunnery, MD. I reached out to [name] by phone today in response to a referral sent by [name]'s PCP, Buelah Manis, Modena Nunnery, MD.   [name] was given information about Chronic Care Management services today including:  1. CCM service includes personalized support from designated clinical staff supervised by her physician, including individualized plan of care and coordination with other care providers 2. 24/7 contact phone numbers for assistance for urgent and routine care needs. 3. Service will only be billed when office clinical staff spend 20 minutes or more in a month to coordinate care. 4. Only one practitioner may furnish and bill the service in a calendar month. 5. The patient may stop CCM services at any time (effective at the end of the month) by phone call to the office staff.   Patient wishes to consider information provided and/or speak with a member of the care team before deciding about enrollment in care management services.   Follow up plan:   Carley Perdue [employer] Scheduler

## 2019-12-02 ENCOUNTER — Ambulatory Visit (INDEPENDENT_AMBULATORY_CARE_PROVIDER_SITE_OTHER): Payer: Medicare Other | Admitting: Family Medicine

## 2019-12-02 ENCOUNTER — Other Ambulatory Visit: Payer: Self-pay

## 2019-12-02 ENCOUNTER — Encounter: Payer: Self-pay | Admitting: Family Medicine

## 2019-12-02 VITALS — BP 150/70 | HR 70 | Temp 96.8°F | Ht 64.0 in | Wt 290.0 lb

## 2019-12-02 DIAGNOSIS — M5136 Other intervertebral disc degeneration, lumbar region: Secondary | ICD-10-CM

## 2019-12-02 DIAGNOSIS — Z136 Encounter for screening for cardiovascular disorders: Secondary | ICD-10-CM | POA: Diagnosis not present

## 2019-12-02 DIAGNOSIS — G4733 Obstructive sleep apnea (adult) (pediatric): Secondary | ICD-10-CM

## 2019-12-02 DIAGNOSIS — E0590 Thyrotoxicosis, unspecified without thyrotoxic crisis or storm: Secondary | ICD-10-CM | POA: Diagnosis not present

## 2019-12-02 DIAGNOSIS — J0100 Acute maxillary sinusitis, unspecified: Secondary | ICD-10-CM

## 2019-12-02 DIAGNOSIS — R350 Frequency of micturition: Secondary | ICD-10-CM | POA: Diagnosis not present

## 2019-12-02 DIAGNOSIS — E669 Obesity, unspecified: Secondary | ICD-10-CM

## 2019-12-02 DIAGNOSIS — I10 Essential (primary) hypertension: Secondary | ICD-10-CM

## 2019-12-02 DIAGNOSIS — Z13220 Encounter for screening for lipoid disorders: Secondary | ICD-10-CM | POA: Diagnosis not present

## 2019-12-02 MED ORDER — AZITHROMYCIN 250 MG PO TABS: ORAL_TABLET | ORAL | 0 refills | Status: DC

## 2019-12-02 MED ORDER — FLUCONAZOLE 150 MG PO TABS: 150.0000 mg | ORAL_TABLET | Freq: Once | ORAL | 0 refills | Status: AC

## 2019-12-02 NOTE — Patient Instructions (Addendum)
We will call with lab results Referral for sleep study Take antibiotic for sinus infection F/U 4 weeks

## 2019-12-02 NOTE — Assessment & Plan Note (Addendum)
Referral for new sleep study Has daytime sleepiness obesity as well as significant snoring per her partners.  Concerned that she stops breathing in her sleep.

## 2019-12-02 NOTE — Assessment & Plan Note (Signed)
Pressure is mildly elevated.  We will have her check at home.  Also plan to bring her back in the office in a few weeks and recheck her blood pressure at losartan can be adjusted if needed.  Patient that she does not have a new diagnosis of diabetes mellitus make sure that her thyroid which was previously subclinical is not contributing.

## 2019-12-02 NOTE — Assessment & Plan Note (Signed)
No change to ultram She is on high dose gabapentin as well which also helps with anxiety

## 2019-12-02 NOTE — Progress Notes (Signed)
Subjective:    Patient ID: Annette Galvan, female    DOB: Apr 01, 1961, 59 y.o.   MRN: [id number]  Patient presents for Follow-up   Pt here to f/u chronic medical problems    Sheis concerned about diabetes, she is thirsty, hungy and lot, urinates a lot as well  No burning with urination but frequent  no dry mouth  Has been going on for the past few months.  She admits that she has been eating too many sweets and fatty foods.   MDD- changed to lexapro , lamictal, xanax three- four  times a day , trazodone 300mg  at bedtime   Chronic pain- she has been taking tramadol BID prn  Taking gapapentin three times a day   HTN- taking losartan     weight down 10lbs since last November.  He has also had sinus pressure drainage for the past few weeks.  She is been treating with her allergy medication.  She has had her Covid vaccine.    Review Of Systems:  GEN- denies fatigue, fever, weight loss,weakness, recent illness HEENT- denies eye drainage, change in vision, +nasal discharge, CVS- denies chest pain, palpitations RESP- denies SOB, cough, wheeze ABD- denies N/V, change in stools, abd pain GU- denies dysuria, hematuria, dribbling, incontinence MSK- denies joint pain, muscle aches, injury Neuro- denies headache, dizziness, syncope, seizure activity       Objective:    BP (!) 150/70   Pulse 70   Temp (!) 96.8 F (36 C) (Temporal)   Ht 5\' 4"  (1.626 m)   Wt 290 lb (131.5 kg)   SpO2 97%   BMI 49.78 kg/m  GEN- NAD, alert and oriented x3 HEENT- PERRL, EOMI, non injected sclera, pink conjunctiva, MMM, oropharynx clear mild nasal clear drainage tenderness to palpation maxillary sinus region Neck- Supple, no thyromegaly CVS- RRR, no murmur RESP-CTAB ABD-NABS,soft,NT,ND EXT- No edema Pulses- Radial, DP- 2+        Assessment & Plan:      Problem List Items Addressed This Visit      Unprioritized   Class 3 obesity   Relevant Orders   Comprehensive metabolic panel    Hemoglobin A1c   DDD (degenerative disc disease), lumbar    No change to ultram She is on high dose gabapentin as well which also helps with anxiety       Hypertension - Primary    Pressure is mildly elevated.  We will have her check at home.  Also plan to bring her back in the office in a few weeks and recheck her blood pressure at losartan can be adjusted if needed.  Patient that she does not have a new diagnosis of diabetes mellitus make sure that her thyroid which was previously subclinical is not contributing.      Relevant Orders   CBC with Differential/Platelet   Comprehensive metabolic panel   Lipid panel   Sleep apnea    Referral for new sleep study Has daytime sleepiness obesity as well as significant snoring per her partners.  Concerned that she stops breathing in her sleep.      Relevant Orders   Ambulatory referral to Neurology   Subclinical hyperthyroidism   Relevant Orders   TSH   T3, free   T4, free    Other Visit Diagnoses    Acute non-recurrent maxillary sinusitis       Continue allergy medication add Z-Pak.   Relevant Medications   azithromycin (ZITHROMAX) 250 MG tablet   fluconazole (DIFLUCAN)  150 MG tablet   Urinary frequency       Relevant Orders   Urinalysis, Routine w reflex microscopic      Note: This dictation was prepared with Dragon dictation along with smaller phrase technology. Any transcriptional errors that result from this process are unintentional.

## 2019-12-03 ENCOUNTER — Telehealth: Payer: Self-pay | Admitting: Family Medicine

## 2019-12-03 LAB — CBC WITH DIFFERENTIAL/PLATELET
Absolute Monocytes: 455 cells/uL (ref 200–950)
Basophils Absolute: 0 cells/uL (ref 0–200)
Basophils Relative: 0 %
Eosinophils Absolute: 70 cells/uL (ref 15–500)
Eosinophils Relative: 1 %
HCT: 40.9 % (ref 35.0–45.0)
Hemoglobin: 13.3 g/dL (ref 11.7–15.5)
Lymphs Abs: 1967 cells/uL (ref 850–3900)
MCH: 30.2 pg (ref 27.0–33.0)
MCHC: 32.5 g/dL (ref 32.0–36.0)
MCV: 93 fL (ref 80.0–100.0)
MPV: 10.2 fL (ref 7.5–12.5)
Monocytes Relative: 6.5 %
Neutro Abs: 4508 cells/uL (ref 1500–7800)
Neutrophils Relative %: 64.4 %
Platelets: 266 10*3/uL (ref 140–400)
RBC: 4.4 10*6/uL (ref 3.80–5.10)
RDW: 13.1 % (ref 11.0–15.0)
Total Lymphocyte: 28.1 %
WBC: 7 10*3/uL (ref 3.8–10.8)

## 2019-12-03 LAB — COMPREHENSIVE METABOLIC PANEL
AG Ratio: 1.3 (calc) (ref 1.0–2.5)
ALT: 12 U/L (ref 6–29)
AST: 19 U/L (ref 10–35)
Albumin: 4 g/dL (ref 3.6–5.1)
Alkaline phosphatase (APISO): 70 U/L (ref 37–153)
BUN: 20 mg/dL (ref 7–25)
CO2: 25 mmol/L (ref 20–32)
Calcium: 8.9 mg/dL (ref 8.6–10.4)
Chloride: 103 mmol/L (ref 98–110)
Creat: 0.84 mg/dL (ref 0.50–1.05)
Globulin: 3 g/dL (calc) (ref 1.9–3.7)
Glucose, Bld: 113 mg/dL — ABNORMAL HIGH (ref 65–99)
Potassium: 4.4 mmol/L (ref 3.5–5.3)
Sodium: 138 mmol/L (ref 135–146)
Total Bilirubin: 0.4 mg/dL (ref 0.2–1.2)
Total Protein: 7 g/dL (ref 6.1–8.1)

## 2019-12-03 LAB — URINALYSIS, ROUTINE W REFLEX MICROSCOPIC
Bilirubin Urine: NEGATIVE
Glucose, UA: NEGATIVE
Hgb urine dipstick: NEGATIVE
Hyaline Cast: NONE SEEN /LPF
Ketones, ur: NEGATIVE
Nitrite: NEGATIVE
Protein, ur: NEGATIVE
Specific Gravity, Urine: 1.031 (ref 1.001–1.03)
pH: 5.5 (ref 5.0–8.0)

## 2019-12-03 LAB — HEMOGLOBIN A1C
Hgb A1c MFr Bld: 5.4 % of total Hgb (ref <5.7)
Mean Plasma Glucose: 108 (calc)
eAG (mmol/L): 6 (calc)

## 2019-12-03 LAB — TSH: TSH: 0.35 mIU/L — ABNORMAL LOW (ref 0.40–4.50)

## 2019-12-03 LAB — T3, FREE: T3, Free: 3.1 pg/mL (ref 2.3–4.2)

## 2019-12-03 LAB — T4, FREE: Free T4: 1 ng/dL (ref 0.8–1.8)

## 2019-12-03 LAB — LIPID PANEL
Cholesterol: 165 mg/dL (ref <200)
HDL: 65 mg/dL (ref >=50)
LDL Cholesterol (Calc): 77 mg/dL (calc)
Non-HDL Cholesterol (Calc): 100 mg/dL (calc) (ref <130)
Total CHOL/HDL Ratio: 2.5 (calc) (ref <5.0)
Triglycerides: 126 mg/dL (ref <150)

## 2019-12-03 NOTE — Telephone Encounter (Signed)
CB# [phone] Lab results

## 2019-12-03 NOTE — Telephone Encounter (Signed)
CB# [phone] Medication Azithromycin 200 mg making her nausea would like to see if another medication can be prescribe

## 2019-12-04 ENCOUNTER — Other Ambulatory Visit: Payer: Self-pay

## 2019-12-04 ENCOUNTER — Other Ambulatory Visit (HOSPITAL_COMMUNITY): Payer: Self-pay | Admitting: Psychiatry

## 2019-12-04 MED ORDER — LEVOFLOXACIN 500 MG PO TABS: 500.0000 mg | ORAL_TABLET | Freq: Every day | ORAL | 0 refills | Status: DC

## 2019-12-04 NOTE — Telephone Encounter (Signed)
See note

## 2019-12-04 NOTE — Telephone Encounter (Signed)
Please see lab results for further information.  

## 2019-12-04 NOTE — Telephone Encounter (Signed)
Send levaquin 500mg  once a day for 7 days

## 2019-12-04 NOTE — Telephone Encounter (Signed)
Patient called back states that she would like another antibiotic called in since the azithromycin made her nauseas.   CB# [phone]

## 2019-12-04 NOTE — Telephone Encounter (Signed)
Rx sent to pharmacy   

## 2019-12-05 ENCOUNTER — Other Ambulatory Visit: Payer: Self-pay | Admitting: Family Medicine

## 2019-12-06 ENCOUNTER — Other Ambulatory Visit: Payer: Self-pay | Admitting: Family Medicine

## 2019-12-11 ENCOUNTER — Other Ambulatory Visit: Payer: Self-pay | Admitting: Family Medicine

## 2019-12-13 ENCOUNTER — Other Ambulatory Visit: Payer: Self-pay | Admitting: Family Medicine

## 2019-12-20 ENCOUNTER — Emergency Department (HOSPITAL_COMMUNITY)
Admission: EM | Admit: 2019-12-20 | Discharge: 2019-12-21 | Disposition: A | Discharge status: Home / Self Care | Payer: Medicare Other | Attending: Emergency Medicine | Admitting: Emergency Medicine

## 2019-12-20 ENCOUNTER — Encounter (HOSPITAL_COMMUNITY): Payer: Self-pay

## 2019-12-20 ENCOUNTER — Other Ambulatory Visit: Payer: Self-pay

## 2019-12-20 ENCOUNTER — Telehealth (HOSPITAL_COMMUNITY): Payer: Medicare Other | Admitting: Psychiatry

## 2019-12-20 DIAGNOSIS — Z5321 Procedure and treatment not carried out due to patient leaving prior to being seen by health care provider: Secondary | ICD-10-CM | POA: Insufficient documentation

## 2019-12-20 DIAGNOSIS — M79605 Pain in left leg: Secondary | ICD-10-CM | POA: Insufficient documentation

## 2019-12-20 NOTE — ED Triage Notes (Signed)
Pt to er, pt c/o L leg pain, states that she has had the pain for the past 8 days, states that it is a shooting pain down her l leg.  States that her arm is also sore where she got her second covid shot last week.

## 2019-12-24 ENCOUNTER — Encounter (HOSPITAL_COMMUNITY): Payer: Self-pay | Admitting: Psychiatry

## 2019-12-24 ENCOUNTER — Telehealth (INDEPENDENT_AMBULATORY_CARE_PROVIDER_SITE_OTHER): Payer: Medicare Other | Admitting: Psychiatry

## 2019-12-24 ENCOUNTER — Other Ambulatory Visit: Payer: Self-pay

## 2019-12-24 DIAGNOSIS — F4310 Post-traumatic stress disorder, unspecified: Secondary | ICD-10-CM | POA: Diagnosis not present

## 2019-12-24 MED ORDER — TRAZODONE HCL 150 MG PO TABS
300.0000 mg | ORAL_TABLET | Freq: Every day | ORAL | 3 refills | Status: DC
Start: 2019-12-24 — End: 2020-03-24

## 2019-12-24 MED ORDER — ESCITALOPRAM OXALATE 20 MG PO TABS
30.0000 mg | ORAL_TABLET | Freq: Every day | ORAL | 2 refills | Status: DC
Start: 2019-12-24 — End: 2020-03-24

## 2019-12-24 MED ORDER — ALPRAZOLAM 1 MG PO TABS
ORAL_TABLET | ORAL | 2 refills | Status: DC
Start: 2019-12-24 — End: 2020-03-24

## 2019-12-24 MED ORDER — LAMOTRIGINE 100 MG PO TABS
100.0000 mg | ORAL_TABLET | Freq: Two times a day (BID) | ORAL | 2 refills | Status: DC
Start: 2019-12-24 — End: 2020-03-24
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
## 2019-12-25 ENCOUNTER — Other Ambulatory Visit: Payer: Self-pay | Admitting: *Deleted

## 2019-12-25 MED ORDER — IBUPROFEN 800 MG PO TABS
800.0000 mg | ORAL_TABLET | Freq: Two times a day (BID) | ORAL | 0 refills | Status: DC | PRN
Start: 2019-12-25 — End: 2020-02-07

## 2019-12-25 MED ORDER — TIZANIDINE HCL 4 MG PO TABS: 4.0000 mg | ORAL_TABLET | Freq: Three times a day (TID) | ORAL | 0 refills | Status: DC | PRN

## 2019-12-25 NOTE — Telephone Encounter (Signed)
Received call from patient.   Reports that she has x1 day of sciatic pain in B buttocks and is requesting refill on IBU. Ok to refill?  Also requested prescription for muscle relaxer. MD please advise.

## 2019-12-25 NOTE — Telephone Encounter (Signed)
zanaflex sent Refilled ibuprofen

## 2019-12-26 ENCOUNTER — Other Ambulatory Visit: Payer: Self-pay | Admitting: Family Medicine

## 2019-12-30 ENCOUNTER — Other Ambulatory Visit: Payer: Self-pay | Admitting: Family Medicine

## 2019-12-31 NOTE — Telephone Encounter (Signed)
Requested Prescriptions   Pending Prescriptions Disp Refills   tiZANidine (ZANAFLEX) 4 MG tablet [Pharmacy Med Name: TIZANIDINE HCL 4 MG TABLET] 20 tablet 0    Sig: TAKE 1 TABLET BY MOUTH ONCE EVERY 8 HOURS AS NEEDED FOR MUSCLE SPASMS.     Last OV 12/02/2019   Last written 12/25/2019

## 2020-01-07 ENCOUNTER — Other Ambulatory Visit: Payer: Self-pay | Admitting: Family Medicine

## 2020-01-22 ENCOUNTER — Other Ambulatory Visit: Payer: Self-pay | Admitting: Family Medicine

## 2020-01-22 NOTE — Telephone Encounter (Signed)
Zanaflex filled on 01/07/2020. Refill denied.   Ok to refill Tramadol??  Last office visit 12/02/2019.  Last refill 12/11/2019.

## 2020-01-25 ENCOUNTER — Other Ambulatory Visit: Payer: Self-pay | Admitting: Family Medicine

## 2020-01-27 NOTE — Telephone Encounter (Signed)
Ok to refill 

## 2020-02-03 ENCOUNTER — Ambulatory Visit: Payer: Medicare Other | Admitting: Family Medicine

## 2020-02-04 ENCOUNTER — Other Ambulatory Visit: Payer: Self-pay | Admitting: Family Medicine

## 2020-02-07 ENCOUNTER — Other Ambulatory Visit: Payer: Self-pay | Admitting: Family Medicine

## 2020-02-28 ENCOUNTER — Telehealth: Payer: Self-pay | Admitting: Family Medicine

## 2020-02-28 NOTE — Progress Notes (Signed)
  Chronic Care Management   Outreach Note  02/28/2020 Name: NAOMIE CROW MRN: [id number] DOB: [birth year]  Referred by: Alycia Rossetti, MD Reason for referral : No chief complaint on file.   A second unsuccessful telephone outreach was attempted today. The patient was referred to pharmacist for assistance with care management and care coordination.  Follow Up Plan:   Carley Perdue [employer] Scheduler

## 2020-03-04 ENCOUNTER — Other Ambulatory Visit: Payer: Self-pay | Admitting: Family Medicine

## 2020-03-16 ENCOUNTER — Encounter: Payer: Medicare Other | Admitting: Family Medicine

## 2020-03-24 ENCOUNTER — Other Ambulatory Visit: Payer: Self-pay

## 2020-03-24 ENCOUNTER — Other Ambulatory Visit: Payer: Self-pay | Admitting: Family Medicine

## 2020-03-24 ENCOUNTER — Encounter (HOSPITAL_COMMUNITY): Payer: Self-pay | Admitting: Psychiatry

## 2020-03-24 ENCOUNTER — Telehealth (INDEPENDENT_AMBULATORY_CARE_PROVIDER_SITE_OTHER): Payer: Medicare Other | Admitting: Psychiatry

## 2020-03-24 DIAGNOSIS — F4310 Post-traumatic stress disorder, unspecified: Secondary | ICD-10-CM

## 2020-03-24 MED ORDER — ALPRAZOLAM 1 MG PO TABS
ORAL_TABLET | ORAL | 2 refills | Status: DC
Start: 2020-03-24 — End: 2020-06-17

## 2020-03-24 MED ORDER — ESCITALOPRAM OXALATE 20 MG PO TABS
30.0000 mg | ORAL_TABLET | Freq: Every day | ORAL | 2 refills | Status: DC
Start: 2020-03-24 — End: 2020-06-17

## 2020-03-24 MED ORDER — TRAZODONE HCL 150 MG PO TABS
300.0000 mg | ORAL_TABLET | Freq: Every day | ORAL | 3 refills | Status: DC
Start: 2020-03-24 — End: 2020-06-17

## 2020-03-24 MED ORDER — LAMOTRIGINE 100 MG PO TABS
100.0000 mg | ORAL_TABLET | Freq: Two times a day (BID) | ORAL | 2 refills | Status: DC
Start: 2020-03-24 — End: 2020-06-17

## 2020-03-24 NOTE — Telephone Encounter (Signed)
Ok to refill??  Last office visit 12/02/2019.  Last refill 01/22/2020.
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
## 2020-03-25 ENCOUNTER — Telehealth: Payer: Self-pay | Admitting: Family Medicine

## 2020-03-25 NOTE — Progress Notes (Signed)
  Chronic Care Management   Outreach Note  03/25/2020 Name: Annette Galvan MRN: [id number] DOB: [birth year]  Referred by: Alycia Rossetti, MD Reason for referral : No chief complaint on file.   Third unsuccessful telephone outreach was attempted today. The patient was referred to the pharmacist for assistance with care management and care coordination.   Follow Up Plan:   Carley Perdue [employer] Scheduler

## 2020-04-01 ENCOUNTER — Other Ambulatory Visit: Payer: Self-pay | Admitting: Family Medicine

## 2020-04-24 ENCOUNTER — Ambulatory Visit: Payer: Medicare Other | Admitting: Family Medicine

## 2020-04-29 ENCOUNTER — Other Ambulatory Visit: Payer: Self-pay | Admitting: Family Medicine

## 2020-05-01 ENCOUNTER — Other Ambulatory Visit: Payer: Self-pay | Admitting: Family Medicine

## 2020-05-01 NOTE — Telephone Encounter (Signed)
Ok to refill??  Last office visit 12/02/2019.  Last refill 03/24/2020.

## 2020-05-18 ENCOUNTER — Other Ambulatory Visit: Payer: Self-pay

## 2020-05-18 ENCOUNTER — Ambulatory Visit (INDEPENDENT_AMBULATORY_CARE_PROVIDER_SITE_OTHER): Payer: Medicare Other | Admitting: Family Medicine

## 2020-05-18 ENCOUNTER — Encounter: Payer: Self-pay | Admitting: Family Medicine

## 2020-05-18 VITALS — BP 138/86 | HR 84 | Temp 98.2°F | Resp 16 | Ht 64.0 in | Wt 288.0 lb

## 2020-05-18 DIAGNOSIS — M25552 Pain in left hip: Secondary | ICD-10-CM | POA: Diagnosis not present

## 2020-05-18 DIAGNOSIS — Z23 Encounter for immunization: Secondary | ICD-10-CM | POA: Diagnosis not present

## 2020-05-18 DIAGNOSIS — M25512 Pain in left shoulder: Secondary | ICD-10-CM | POA: Diagnosis not present

## 2020-05-18 DIAGNOSIS — E0590 Thyrotoxicosis, unspecified without thyrotoxic crisis or storm: Secondary | ICD-10-CM

## 2020-05-18 DIAGNOSIS — Z79899 Other long term (current) drug therapy: Secondary | ICD-10-CM | POA: Diagnosis not present

## 2020-05-18 DIAGNOSIS — M170 Bilateral primary osteoarthritis of knee: Secondary | ICD-10-CM

## 2020-05-18 DIAGNOSIS — M797 Fibromyalgia: Secondary | ICD-10-CM | POA: Diagnosis not present

## 2020-05-18 DIAGNOSIS — I10 Essential (primary) hypertension: Secondary | ICD-10-CM | POA: Diagnosis not present

## 2020-05-18 DIAGNOSIS — R7303 Prediabetes: Secondary | ICD-10-CM | POA: Diagnosis not present

## 2020-05-18 DIAGNOSIS — Z7689 Persons encountering health services in other specified circumstances: Secondary | ICD-10-CM | POA: Diagnosis not present

## 2020-05-18 DIAGNOSIS — Z113 Encounter for screening for infections with a predominantly sexual mode of transmission: Secondary | ICD-10-CM | POA: Diagnosis not present

## 2020-05-18 DIAGNOSIS — G8929 Other chronic pain: Secondary | ICD-10-CM

## 2020-05-18 DIAGNOSIS — R300 Dysuria: Secondary | ICD-10-CM

## 2020-05-18 LAB — URINALYSIS, ROUTINE W REFLEX MICROSCOPIC
Bilirubin Urine: NEGATIVE
Glucose, UA: NEGATIVE
Hgb urine dipstick: NEGATIVE
Hyaline Cast: NONE SEEN /LPF
Ketones, ur: NEGATIVE
Leukocytes,Ua: NEGATIVE
Nitrite: POSITIVE — AB
Protein, ur: NEGATIVE
RBC / HPF: NONE SEEN /HPF (ref 0–2)
Specific Gravity, Urine: 1.02 (ref 1.001–1.03)
pH: 6 (ref 5.0–8.0)

## 2020-05-18 LAB — MICROSCOPIC MESSAGE

## 2020-05-18 LAB — WET PREP FOR TRICH, YEAST, CLUE

## 2020-05-18 MED ORDER — OMEPRAZOLE 40 MG PO CPDR: DELAYED_RELEASE_CAPSULE | ORAL | 1 refills | Status: DC

## 2020-05-18 MED ORDER — TRAMADOL HCL 50 MG PO TABS: 50.0000 mg | ORAL_TABLET | Freq: Two times a day (BID) | ORAL | 0 refills | Status: DC | PRN

## 2020-05-18 MED ORDER — TIZANIDINE HCL 4 MG PO TABS: ORAL_TABLET | ORAL | 1 refills | Status: DC

## 2020-05-18 MED ORDER — LOSARTAN POTASSIUM 50 MG PO TABS: 50.0000 mg | ORAL_TABLET | Freq: Every day | ORAL | 1 refills | Status: DC

## 2020-05-18 NOTE — Progress Notes (Signed)
Subjective:    Patient ID: Annette Galvan, female    DOB: 1961-01-20, 60 y.o.   MRN: [id number]  Patient presents for Malodorous Urine ([place] odor, foamy urine) and STD Check (Would like labs done)  Pt here to f/u meds She has chronic pain from fibromyalgia but states since she had her covid shot back in august she has had worsening muscle spasms and pain on left side, in arm, hip and knee. She needs new ortho, due to know OA knees, overdue for injections, but needs one closer, previously followed by Dr.Lucey  For the past month noticed odor to urine, she started drinking more water but till has odor, concern she has infection, occ burining but much improved Also request STD check    Review Of Systems:  GEN- denies fatigue, fever, weight loss,weakness, recent illness HEENT- denies eye drainage, change in vision, nasal discharge, CVS- denies chest pain, palpitations RESP- denies SOB, cough, wheeze ABD- denies N/V, change in stools, abd pain GU- denies dysuria, hematuria, dribbling, incontinence MSK- + joint pain, muscle aches, injury Neuro- denies headache, dizziness, syncope, seizure activity       Objective:    BP 138/86   Pulse 84   Temp 98.2 F (36.8 C) (Temporal)   Resp 16   Ht 5\' 4"  (1.626 m)   Wt 288 lb (130.6 kg)   SpO2 95%   BMI 49.44 kg/m  GEN- NAD, alert and oriented x3 HEENT- PERRL, EOMI, non injected sclera, pink conjunctiva, MMM, oropharynx clear Neck- Supple, no thyromegaly CVS- RRR, no murmur RESP-CTAB ABD-NABS,soft,NT,ND GU- normal external genitalia - vaginal atrophy,uterus/cervix absent, mild discharge in vault MSK- fair ROM Bilat knee hips, TTP left hip, fair ROM upper ext, rotator cuff in tact, biceps in tact EXT- No edema Pulses- Radial2+        Assessment & Plan:      Problem List Items Addressed This Visit      Unprioritized   Borderline diabetic   Relevant Orders   CBC with Differential/Platelet   Hemoglobin A1c   Fibromyalgia     Chronic pain, but she does have OA knees Continue gabapentin, I decline increasing to 800mg  TID due to her other medications and potentional interactions For OA has ultram prn Referral to new orthopedic       Relevant Medications   tiZANidine (ZANAFLEX) 4 MG tablet   Hypertension    Controlled no changes      Relevant Orders   CBC with Differential/Platelet   Comprehensive metabolic panel   Morbid obesity (HCC)   OA (osteoarthritis) of knee   Relevant Medications   tiZANidine (ZANAFLEX) 4 MG tablet   Polypharmacy   Subclinical hyperthyroidism    Has seen endo in past Recheck TSH       Other Visit Diagnoses    Dysuria    -  Primary   Relevant Orders   Urinalysis, Routine w reflex microscopic (Completed)   Urine Culture   Screen for STD (sexually transmitted disease)       Relevant Orders   C. trachomatis/N. gonorrhoeae RNA   WET PREP FOR TRICH, YEAST, CLUE (Completed)   Chronic left shoulder pain       Relevant Medications   tiZANidine (ZANAFLEX) 4 MG tablet   Left hip pain       Need for immunization against influenza       Relevant Orders   Flu Vaccine QUAD 36+ mos IM (Completed)      Note: This dictation was  prepared with Dragon dictation along with smaller phrase technology. Any transcriptional errors that result from this process are unintentional.

## 2020-05-18 NOTE — Assessment & Plan Note (Signed)
Chronic pain, but she does have OA knees Continue gabapentin, I decline increasing to 800mg  TID due to her other medications and potentional interactions For OA has ultram prn Referral to new orthopedic

## 2020-05-18 NOTE — Assessment & Plan Note (Signed)
Has seen endo in past Recheck TSH

## 2020-05-18 NOTE — Assessment & Plan Note (Signed)
Controlled no changes 

## 2020-05-18 NOTE — Patient Instructions (Addendum)
Referral to orthopedics We will call with lab and urine results  Muscle relaxer refilled

## 2020-05-19 LAB — CBC WITH DIFFERENTIAL/PLATELET
Absolute Monocytes: 382 cells/uL (ref 200–950)
Basophils Absolute: 7 cells/uL (ref 0–200)
Basophils Relative: 0.1 %
Eosinophils Absolute: 94 cells/uL (ref 15–500)
Eosinophils Relative: 1.3 %
HCT: 38.5 % (ref 35.0–45.0)
Hemoglobin: 12.5 g/dL (ref 11.7–15.5)
Lymphs Abs: 2153 cells/uL (ref 850–3900)
MCH: 29.7 pg (ref 27.0–33.0)
MCHC: 32.5 g/dL (ref 32.0–36.0)
MCV: 91.4 fL (ref 80.0–100.0)
MPV: 9 fL (ref 7.5–12.5)
Monocytes Relative: 5.3 %
Neutro Abs: 4565 cells/uL (ref 1500–7800)
Neutrophils Relative %: 63.4 %
Platelets: 390 10*3/uL (ref 140–400)
RBC: 4.21 10*6/uL (ref 3.80–5.10)
RDW: 12.9 % (ref 11.0–15.0)
Total Lymphocyte: 29.9 %
WBC: 7.2 10*3/uL (ref 3.8–10.8)

## 2020-05-19 LAB — COMPREHENSIVE METABOLIC PANEL
AG Ratio: 1.1 (calc) (ref 1.0–2.5)
ALT: 13 U/L (ref 6–29)
AST: 18 U/L (ref 10–35)
Albumin: 3.7 g/dL (ref 3.6–5.1)
Alkaline phosphatase (APISO): 70 U/L (ref 37–153)
BUN: 14 mg/dL (ref 7–25)
CO2: 29 mmol/L (ref 20–32)
Calcium: 9.2 mg/dL (ref 8.6–10.4)
Chloride: 105 mmol/L (ref 98–110)
Creat: 0.83 mg/dL (ref 0.50–1.05)
Globulin: 3.3 g/dL (calc) (ref 1.9–3.7)
Glucose, Bld: 88 mg/dL (ref 65–99)
Potassium: 4.8 mmol/L (ref 3.5–5.3)
Sodium: 140 mmol/L (ref 135–146)
Total Bilirubin: 0.2 mg/dL (ref 0.2–1.2)
Total Protein: 7 g/dL (ref 6.1–8.1)

## 2020-05-19 LAB — C. TRACHOMATIS/N. GONORRHOEAE RNA
C. trachomatis RNA, TMA: NOT DETECTED
N. gonorrhoeae RNA, TMA: NOT DETECTED

## 2020-05-19 LAB — HEMOGLOBIN A1C
Hgb A1c MFr Bld: 5.7 % of total Hgb — ABNORMAL HIGH (ref <5.7)
Mean Plasma Glucose: 117 mg/dL
eAG (mmol/L): 6.5 mmol/L

## 2020-05-21 ENCOUNTER — Other Ambulatory Visit: Payer: Self-pay | Admitting: *Deleted

## 2020-05-21 LAB — URINE CULTURE
MICRO NUMBER:: 11475665
SPECIMEN QUALITY:: ADEQUATE

## 2020-05-21 MED ORDER — CIPROFLOXACIN HCL 500 MG PO TABS: 500.0000 mg | ORAL_TABLET | Freq: Two times a day (BID) | ORAL | 0 refills | Status: DC

## 2020-05-21 MED ORDER — METRONIDAZOLE 500 MG PO TABS
500.0000 mg | ORAL_TABLET | Freq: Two times a day (BID) | ORAL | 0 refills | Status: DC
Start: 2020-05-21 — End: 2020-07-06

## 2020-05-26 ENCOUNTER — Other Ambulatory Visit: Payer: Self-pay | Admitting: Family Medicine

## 2020-05-27 ENCOUNTER — Telehealth: Payer: Self-pay | Admitting: *Deleted

## 2020-05-27 ENCOUNTER — Other Ambulatory Visit: Payer: Self-pay | Admitting: Family Medicine

## 2020-05-27 MED ORDER — ONDANSETRON HCL 4 MG PO TABS: 4.0000 mg | ORAL_TABLET | Freq: Three times a day (TID) | ORAL | 0 refills | Status: DC | PRN

## 2020-05-27 NOTE — Telephone Encounter (Signed)
zofran sent to pharmacy  Take the rest of  antibiotics with food

## 2020-05-27 NOTE — Telephone Encounter (Signed)
Received call from patient.   Reports that she has been taking both ABTx (Flagyl/Cipro). States that she has increased nausea throughout the day, even if she takes ABTx with meals.   Requested nausea medication.   Please advise.

## 2020-05-27 NOTE — Telephone Encounter (Signed)
Call placed to patient and patient made aware.  

## 2020-05-28 ENCOUNTER — Other Ambulatory Visit: Payer: Self-pay | Admitting: Family Medicine

## 2020-06-01 ENCOUNTER — Other Ambulatory Visit: Payer: Self-pay | Admitting: Family Medicine

## 2020-06-02 ENCOUNTER — Ambulatory Visit (INDEPENDENT_AMBULATORY_CARE_PROVIDER_SITE_OTHER): Payer: Medicare Other | Admitting: Nurse Practitioner

## 2020-06-02 ENCOUNTER — Other Ambulatory Visit: Payer: Self-pay

## 2020-06-02 ENCOUNTER — Encounter: Payer: Self-pay | Admitting: Nurse Practitioner

## 2020-06-02 VITALS — BP 159/72 | HR 86 | Temp 98.9°F | Resp 20 | Ht 64.0 in | Wt 283.0 lb

## 2020-06-02 DIAGNOSIS — F319 Bipolar disorder, unspecified: Secondary | ICD-10-CM

## 2020-06-02 DIAGNOSIS — R7303 Prediabetes: Secondary | ICD-10-CM | POA: Diagnosis not present

## 2020-06-02 DIAGNOSIS — I10 Essential (primary) hypertension: Secondary | ICD-10-CM | POA: Diagnosis not present

## 2020-06-02 DIAGNOSIS — Z7689 Persons encountering health services in other specified circumstances: Secondary | ICD-10-CM | POA: Diagnosis not present

## 2020-06-02 DIAGNOSIS — M170 Bilateral primary osteoarthritis of knee: Secondary | ICD-10-CM | POA: Diagnosis not present

## 2020-06-02 DIAGNOSIS — K219 Gastro-esophageal reflux disease without esophagitis: Secondary | ICD-10-CM

## 2020-06-02 DIAGNOSIS — E0590 Thyrotoxicosis, unspecified without thyrotoxic crisis or storm: Secondary | ICD-10-CM

## 2020-06-02 MED ORDER — ONDANSETRON HCL 4 MG PO TABS: 4.0000 mg | ORAL_TABLET | Freq: Three times a day (TID) | ORAL | 0 refills | Status: DC | PRN

## 2020-06-02 NOTE — Assessment & Plan Note (Signed)
Wt Readings from Last 3 Encounters:  06/02/20 283 lb (128.4 kg)  05/18/20 288 lb (130.6 kg)  12/20/19 290 lb (131.5 kg)

## 2020-06-02 NOTE — Assessment & Plan Note (Signed)
-  BP elevated -taking losartan -may change dose or meds at next visit if still elevated

## 2020-06-02 NOTE — Addendum Note (Signed)
Addended by: Demetrius Revel on: 06/02/2020 01:43 PM   Modules accepted: Orders

## 2020-06-02 NOTE — Progress Notes (Signed)
New Patient Office Visit  Subjective:  Patient ID: Annette Galvan, female    DOB: 1961/02/25  Age: 60 y.o. MRN: [id number]  CC:  Chief Complaint  Patient presents with  . New Patient (Initial Visit)    Transferring care from Dr. Buelah Manis     HPI Annette Galvan presents for new patient visit. Transferring care from Dr. Buelah Manis. Last labs drawn 05/18/20 No recent physical.  She is followed by Ortho, Dr. Aline Brochure, for her knees. For fibromyalgia, gabapentin is helping. She is followed by Dr. Harrington Challenger for bipolar I and PTSD.  She is having nausea today. She was started on cipro and flagyl for a vaginal infection.  Past Medical History:  Diagnosis Date  . Arthritis    needs 2 knee replacements; also has left shoulder and left hip pain  . Back pain, chronic   . Bilateral chronic knee pain   . Bipolar 1 disorder ([place])   . Bulging disc   . Fibromyalgia   . GERD (gastroesophageal reflux disease)   . HSV-2 (herpes simplex virus 2) infection 2013  . Hx of trichomoniasis   . Itching 06/26/2013  . Leg pain   . Trichimoniasis 06/26/2013  . UTI (lower urinary tract infection) 10/12/2012  . Vaginal discharge 06/26/2013  . Vaginal irritation 12/05/2013  . Vaginal odor 12/28/2012   Had discharge +clue  . Yeast infection 02/04/2013    Past Surgical History:  Procedure Laterality Date  . ABDOMINAL HYSTERECTOMY     total  . BREAST ENHANCEMENT SURGERY Right   . COLONOSCOPY WITH PROPOFOL N/A 12/07/2015   Procedure: COLONOSCOPY WITH PROPOFOL;  Surgeon: Daneil Dolin, MD;  Location: [facility];  Service: Endoscopy;  Laterality: N/A;  915  . TUBAL LIGATION      Family History  Problem Relation Age of Onset  . Hypertension Mother   . Diabetes Mother   . Depression Mother   . Hyperlipidemia Mother   . Fibromyalgia Mother   . Arthritis Mother   . Cancer Father        bone  . Hyperlipidemia Father   . Hypertension Father   . Bipolar disorder Father   . Depression Sister   .  Hyperlipidemia Sister   . Fibromyalgia Sister   . Fibromyalgia Sister   . Bipolar disorder Other   . Drug abuse Other   . Alcohol abuse Other   . Hypertension Brother   . Other Brother        shingles; back problems  . Diabetes Paternal Grandmother   . Alzheimer's disease Maternal Grandmother   . Obesity Daughter   . Other Daughter        back problems  . Bipolar disorder Daughter   . Depression Daughter   . Other Daughter        on pain meds  . Colon cancer Neg Hx     Social History   Socioeconomic History  . Marital status: Widowed    Spouse name: Not on file  . Number of children: Not on file  . Years of education: Not on file  . Highest education level: Not on file  Occupational History  . Occupation: Disabled  Tobacco Use  . Smoking status: Light Tobacco Smoker    Packs/day: 0.25    Years: 25.00    Pack years: 6.25    Types: Cigarettes  . Smokeless tobacco: Never Used  . Tobacco comment: 2 cigarettes daily  Vaping Use  . Vaping Use: Never used  Substance and Sexual Activity  . Alcohol use: Yes    Alcohol/week: 0.0 standard drinks    Comment: occasional  . Drug use: No    Comment: cocaine, clean for 4.5 years as of 12/04/2015  . Sexual activity: Yes    Birth control/protection: Surgical    Comment: hysterectomy  Other Topics Concern  . Not on file  Social History Narrative  . Not on file   Social Determinants of Health   Financial Resource Strain: Not on file  Food Insecurity: Not on file  Transportation Needs: Not on file  Physical Activity: Not on file  Stress: Not on file  Social Connections: Not on file  Intimate Partner Violence: Not on file    ROS Review of Systems  Objective:   Today's Vitals: BP (!) 159/72   Pulse 86   Temp 98.9 F (37.2 C)   Resp 20   Ht 5' 4" (1.626 m)   Wt 283 lb (128.4 kg)   SpO2 94%   BMI 48.58 kg/m   Physical Exam  Assessment & Plan:   Problem List Items Addressed This Visit      Cardiovascular  and Mediastinum   Hypertension    -BP elevated -taking losartan -may change dose or meds at next visit if still elevated      Relevant Orders   CBC with Differential/Platelet   CMP14+EGFR   Lipid Panel With LDL/HDL Ratio     Digestive   GERD (gastroesophageal reflux disease)    -no issues today -takes prilosec        Endocrine   Subclinical hyperthyroidism - Primary   Relevant Orders   TSH + free T4     Musculoskeletal and Integument   OA (osteoarthritis) of knee    -Followed by Dr. Aline Brochure, has upcoming appt; did see ortho in [place]        Other   Morbid obesity ([place])    Wt Readings from Last 3 Encounters:  06/02/20 283 lb (128.4 kg)  05/18/20 288 lb (130.6 kg)  12/20/19 290 lb (131.5 kg)         Borderline diabetic    -will check A1c with next set of labs      Relevant Orders   Hemoglobin A1c   Microalbumin / creatinine urine ratio   Bipolar 1 disorder ([place])    -followed by Dr. Harrington Challenger -psych meds include trazodone, lamictal, and lexapro      Encounter to establish care    -records from Dr. Buelah Manis in Epic      Relevant Orders   CBC with Differential/Platelet   CMP14+EGFR   Hemoglobin A1c   Lipid Panel With LDL/HDL Ratio   TSH + free T4   Microalbumin / creatinine urine ratio      Outpatient Encounter Medications as of 06/02/2020  Medication Sig  . ALPRAZolam (XANAX) 1 MG tablet TAKE (1) TABLET BY MOUTH (4) TIMES DAILY AS NEEDED FOR ANXIETY.  . cetirizine (ZYRTEC) 10 MG tablet TAKE 1 TABLET BY MOUTH ONCE DAILY.  . ciprofloxacin (CIPRO) 500 MG tablet Take 1 tablet (500 mg total) by mouth 2 (two) times daily.  . diclofenac sodium (VOLTAREN) 1 % GEL APPLY 2 GRAMS TO THE AFFECTED AREAS FOUR TIMES A DAY AS DIRECTED.  Marland Kitchen diclofenac Sodium (VOLTAREN) 1 % GEL APPLY 2 GRAMS TO THE AFFECTED AREAS FOUR TIMES A DAY AS DIRECTED.  Marland Kitchen escitalopram (LEXAPRO) 20 MG tablet Take 1.5 tablets (30 mg total) by mouth daily.  . fluticasone (FLONASE) 50  MCG/ACT nasal spray  INSTILL 2 SPRAYS IN EACH NOSTRIL TWICE DAILY AS DIRECTED.  Marland Kitchen gabapentin (NEURONTIN) 600 MG tablet TAKE 1 TABLET BY MOUTH THREE TIMES DAILY.  Marland Kitchen ibuprofen (ADVIL) 800 MG tablet TAKE 1 TABLET BY MOUTH TWICE DAILY AS NEEDED.  Marland Kitchen lamoTRIgine (LAMICTAL) 100 MG tablet Take 1 tablet (100 mg total) by mouth 2 (two) times daily.  Marland Kitchen losartan (COZAAR) 50 MG tablet Take 1 tablet (50 mg total) by mouth daily.  . metroNIDAZOLE (FLAGYL) 500 MG tablet Take 1 tablet (500 mg total) by mouth 2 (two) times daily.  Marland Kitchen nystatin (MYCOSTATIN/NYSTOP) powder APPLY TO THE AFFECTED AREAS FOUR TIMES DAILY AS DIRECTED.  Marland Kitchen omeprazole (PRILOSEC) 40 MG capsule TAKE (1) CAPSULE BY MOUTH ONCE DAILY FOR ACID REFLUX.  Marland Kitchen ondansetron (ZOFRAN) 4 MG tablet Take 1 tablet (4 mg total) by mouth every 8 (eight) hours as needed for nausea or vomiting.  Marland Kitchen tiZANidine (ZANAFLEX) 4 MG tablet TAKE 1 TABLET BY MOUTH ONCE EVERY 8 HOURS AS NEEDED FOR MUSCLE SPASMS.  Marland Kitchen traMADol (ULTRAM) 50 MG tablet Take 1 tablet (50 mg total) by mouth every 12 (twelve) hours as needed.  . traZODone (DESYREL) 150 MG tablet Take 2 tablets (300 mg total) by mouth at bedtime.   No facility-administered encounter medications on file as of 06/02/2020.    Follow-up: Return in about 2 months (around 08/16/2020) for Lab follow-up (fasting labs drawn 2-3 days prior).   Noreene Larsson, NP

## 2020-06-02 NOTE — Assessment & Plan Note (Signed)
-  records from Dr. [place] in Epic 

## 2020-06-02 NOTE — Assessment & Plan Note (Signed)
-  will check A1c with next set of labs

## 2020-06-02 NOTE — Assessment & Plan Note (Signed)
-  followed by Dr. Harrington Challenger -psych meds include trazodone, lamictal, and lexapro

## 2020-06-02 NOTE — Assessment & Plan Note (Signed)
-  Followed by Dr. Aline Brochure, has upcoming appt; did see ortho in [place]

## 2020-06-02 NOTE — Assessment & Plan Note (Signed)
-  no issues today -takes prilosec

## 2020-06-12 ENCOUNTER — Other Ambulatory Visit: Payer: Self-pay | Admitting: Nurse Practitioner

## 2020-06-12 ENCOUNTER — Telehealth: Payer: Self-pay

## 2020-06-12 MED ORDER — FLUCONAZOLE 150 MG PO TABS: 150.0000 mg | ORAL_TABLET | Freq: Once | ORAL | 0 refills | Status: DC

## 2020-06-12 MED ORDER — FLUCONAZOLE 150 MG PO TABS: 150.0000 mg | ORAL_TABLET | Freq: Once | ORAL | 0 refills | Status: AC

## 2020-06-12 NOTE — Telephone Encounter (Signed)
Pt informed

## 2020-06-12 NOTE — Telephone Encounter (Signed)
Can we send this in ?

## 2020-06-12 NOTE — Telephone Encounter (Signed)
Patient was given 2 antibiotics and now has a yeast infection.  She said she needs a yeast infection pill.    Pharmacy:  [facility]

## 2020-06-12 NOTE — Telephone Encounter (Signed)
Sent in diflucan

## 2020-06-17 ENCOUNTER — Other Ambulatory Visit: Payer: Self-pay | Admitting: Family Medicine

## 2020-06-17 ENCOUNTER — Telehealth (INDEPENDENT_AMBULATORY_CARE_PROVIDER_SITE_OTHER): Payer: Medicare Other | Admitting: Psychiatry

## 2020-06-17 ENCOUNTER — Encounter (HOSPITAL_COMMUNITY): Payer: Self-pay | Admitting: Psychiatry

## 2020-06-17 ENCOUNTER — Other Ambulatory Visit: Payer: Self-pay

## 2020-06-17 DIAGNOSIS — F4310 Post-traumatic stress disorder, unspecified: Secondary | ICD-10-CM | POA: Diagnosis not present

## 2020-06-17 MED ORDER — ESCITALOPRAM OXALATE 20 MG PO TABS: 30.0000 mg | ORAL_TABLET | Freq: Every day | ORAL | 2 refills | Status: DC

## 2020-06-17 MED ORDER — TRAZODONE HCL 150 MG PO TABS: 300.0000 mg | ORAL_TABLET | Freq: Every day | ORAL | 3 refills | Status: DC

## 2020-06-17 MED ORDER — LAMOTRIGINE 100 MG PO TABS: 100.0000 mg | ORAL_TABLET | Freq: Two times a day (BID) | ORAL | 2 refills | Status: DC

## 2020-06-17 MED ORDER — ALPRAZOLAM 1 MG PO TABS: ORAL_TABLET | ORAL | 2 refills | Status: DC
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
## 2020-06-22 ENCOUNTER — Telehealth (HOSPITAL_COMMUNITY): Payer: Medicare Other | Admitting: Psychiatry

## 2020-06-22 ENCOUNTER — Other Ambulatory Visit: Payer: Self-pay | Admitting: Family Medicine

## 2020-07-02 ENCOUNTER — Other Ambulatory Visit: Payer: Self-pay | Admitting: Family Medicine

## 2020-07-02 ENCOUNTER — Telehealth: Payer: Self-pay

## 2020-07-02 ENCOUNTER — Other Ambulatory Visit: Payer: Self-pay | Admitting: Nurse Practitioner

## 2020-07-02 MED ORDER — ONDANSETRON 4 MG PO TBDP: 4.0000 mg | ORAL_TABLET | Freq: Three times a day (TID) | ORAL | 0 refills | Status: DC | PRN

## 2020-07-02 NOTE — Telephone Encounter (Signed)
sent 

## 2020-07-02 NOTE — Telephone Encounter (Signed)
Pt informed

## 2020-07-02 NOTE — Telephone Encounter (Signed)
Pt called and states that her insurance will not approve the regular Zofran, it will only cover the dissolvable ondansetron. Can we re-send the rx.

## 2020-07-20 ENCOUNTER — Other Ambulatory Visit: Payer: Self-pay | Admitting: Nurse Practitioner

## 2020-07-22 ENCOUNTER — Other Ambulatory Visit: Payer: Self-pay | Admitting: Family Medicine

## 2020-08-12 DIAGNOSIS — I10 Essential (primary) hypertension: Secondary | ICD-10-CM | POA: Diagnosis not present

## 2020-08-12 DIAGNOSIS — R7303 Prediabetes: Secondary | ICD-10-CM | POA: Diagnosis not present

## 2020-08-12 DIAGNOSIS — Z7689 Persons encountering health services in other specified circumstances: Secondary | ICD-10-CM | POA: Diagnosis not present

## 2020-08-12 DIAGNOSIS — E0590 Thyrotoxicosis, unspecified without thyrotoxic crisis or storm: Secondary | ICD-10-CM | POA: Diagnosis not present

## 2020-08-13 LAB — SPECIMEN STATUS REPORT

## 2020-08-13 NOTE — Progress Notes (Signed)
Her labs look good.  Her TSH is a little low, but this is not new for her. If she starts losing weight or feeling anxious or having palpitations, we will talk about an endocrinology referral... but for now, we will just keep an eye on it.

## 2020-08-16 LAB — CBC WITH DIFFERENTIAL/PLATELET
Basophils Absolute: 0 10*3/uL (ref 0.0–0.2)
Basos: 0 %
EOS (ABSOLUTE): 0.2 10*3/uL (ref 0.0–0.4)
Eos: 3 %
Hematocrit: 37.8 % (ref 34.0–46.6)
Hemoglobin: 12.6 g/dL (ref 11.1–15.9)
Immature Grans (Abs): 0 10*3/uL (ref 0.0–0.1)
Immature Granulocytes: 0 %
Lymphocytes Absolute: 2 10*3/uL (ref 0.7–3.1)
Lymphs: 33 %
MCH: 30.1 pg (ref 26.6–33.0)
MCHC: 33.3 g/dL (ref 31.5–35.7)
MCV: 90 fL (ref 79–97)
Monocytes Absolute: 0.5 10*3/uL (ref 0.1–0.9)
Monocytes: 8 %
Neutrophils Absolute: 3.4 10*3/uL (ref 1.4–7.0)
Neutrophils: 56 %
Platelets: 283 10*3/uL (ref 150–450)
RBC: 4.18 x10E6/uL (ref 3.77–5.28)
RDW: 13 % (ref 11.7–15.4)
WBC: 6.2 10*3/uL (ref 3.4–10.8)

## 2020-08-16 LAB — CMP14+EGFR
ALT: 9 IU/L (ref 0–32)
AST: 17 IU/L (ref 0–40)
Albumin/Globulin Ratio: 1.7 (ref 1.2–2.2)
Albumin: 4.3 g/dL (ref 3.8–4.9)
Alkaline Phosphatase: 78 IU/L (ref 44–121)
BUN/Creatinine Ratio: 21 (ref 9–23)
BUN: 20 mg/dL (ref 6–24)
Bilirubin Total: <0.2 mg/dL (ref 0.0–1.2)
CO2: 26 mmol/L (ref 20–29)
Calcium: 8.9 mg/dL (ref 8.7–10.2)
Chloride: 96 mmol/L (ref 96–106)
Creatinine, Ser: 0.94 mg/dL (ref 0.57–1.00)
Globulin, Total: 2.5 g/dL (ref 1.5–4.5)
Glucose: 89 mg/dL (ref 65–99)
Potassium: 4.7 mmol/L (ref 3.5–5.2)
Sodium: 137 mmol/L (ref 134–144)
Total Protein: 6.8 g/dL (ref 6.0–8.5)
eGFR: 70 mL/min/{1.73_m2} (ref >59)

## 2020-08-16 LAB — TSH+FREE T4
Free T4: 1.41 ng/dL (ref 0.82–1.77)
TSH: 0.43 u[IU]/mL — ABNORMAL LOW (ref 0.450–4.500)

## 2020-08-16 LAB — HEMOGLOBIN A1C
Est. average glucose Bld gHb Est-mCnc: 103 mg/dL
Hgb A1c MFr Bld: 5.2 % (ref 4.8–5.6)

## 2020-08-16 LAB — LIPID PANEL WITH LDL/HDL RATIO
Cholesterol, Total: 162 mg/dL (ref 100–199)
HDL: 54 mg/dL (ref >39)
LDL Chol Calc (NIH): 88 mg/dL (ref 0–99)
LDL/HDL Ratio: 1.6 ratio (ref 0.0–3.2)
Triglycerides: 112 mg/dL (ref 0–149)
VLDL Cholesterol Cal: 20 mg/dL (ref 5–40)

## 2020-08-17 ENCOUNTER — Ambulatory Visit (INDEPENDENT_AMBULATORY_CARE_PROVIDER_SITE_OTHER): Payer: Medicare Other | Admitting: Nurse Practitioner

## 2020-08-17 ENCOUNTER — Other Ambulatory Visit: Payer: Self-pay

## 2020-08-17 ENCOUNTER — Encounter: Payer: Self-pay | Admitting: Nurse Practitioner

## 2020-08-17 VITALS — BP 136/81 | HR 86 | Temp 99.0°F | Resp 20 | Ht 63.0 in | Wt 280.0 lb

## 2020-08-17 DIAGNOSIS — Z7251 High risk heterosexual behavior: Secondary | ICD-10-CM | POA: Insufficient documentation

## 2020-08-17 DIAGNOSIS — E0590 Thyrotoxicosis, unspecified without thyrotoxic crisis or storm: Secondary | ICD-10-CM | POA: Diagnosis not present

## 2020-08-17 DIAGNOSIS — I10 Essential (primary) hypertension: Secondary | ICD-10-CM | POA: Diagnosis not present

## 2020-08-17 DIAGNOSIS — M170 Bilateral primary osteoarthritis of knee: Secondary | ICD-10-CM | POA: Diagnosis not present

## 2020-08-17 DIAGNOSIS — M25561 Pain in right knee: Secondary | ICD-10-CM

## 2020-08-17 DIAGNOSIS — M25512 Pain in left shoulder: Secondary | ICD-10-CM

## 2020-08-17 DIAGNOSIS — M25562 Pain in left knee: Secondary | ICD-10-CM

## 2020-08-17 DIAGNOSIS — G8929 Other chronic pain: Secondary | ICD-10-CM

## 2020-08-17 MED ORDER — DICLOFENAC SODIUM 1 % EX GEL: 2.0000 g | Freq: Four times a day (QID) | CUTANEOUS | 1 refills | Status: DC

## 2020-08-17 MED ORDER — TRAMADOL HCL 50 MG PO TABS: 50.0000 mg | ORAL_TABLET | Freq: Two times a day (BID) | ORAL | 1 refills | Status: DC | PRN

## 2020-08-17 MED ORDER — IBUPROFEN 800 MG PO TABS: 800.0000 mg | ORAL_TABLET | Freq: Two times a day (BID) | ORAL | 0 refills | Status: DC | PRN

## 2020-08-17 MED ORDER — TIZANIDINE HCL 4 MG PO TABS: 4.0000 mg | ORAL_TABLET | Freq: Three times a day (TID) | ORAL | 1 refills | Status: DC | PRN

## 2020-08-17 NOTE — Assessment & Plan Note (Signed)
-  well controlled today 

## 2020-08-17 NOTE — Assessment & Plan Note (Signed)
Lab Results  Component Value Date   TSH 0.430 (L) 08/12/2020  -improved from 0.35 previously -recheck in 3 months

## 2020-08-17 NOTE — Assessment & Plan Note (Signed)
-  BMI 49 -she states she needs to lose weight to be candidate for ortho surgery -referral to weight mgmt

## 2020-08-17 NOTE — Progress Notes (Signed)
Established Patient Office Visit  Subjective:  Patient ID: Annette Galvan, female    DOB: 12-19-1960  Age: 60 y.o. MRN: [id number]  CC:  Chief Complaint  Patient presents with  . Hyperthyroidism    HPI Annette Galvan presents for lab follow-up for hypothyroidism. She had labs drawn on 08/12/20. She has a friend who has the same sexual partner, and she was told that her friend has HIV.  Her last intercourse with the mutual partner was 05/14/20.  Past Medical History:  Diagnosis Date  . Arthritis    needs 2 knee replacements; also has left shoulder and left hip pain  . Back pain, chronic   . Bilateral chronic knee pain   . Bipolar 1 disorder ([place])   . Bulging disc   . Fibromyalgia   . GERD (gastroesophageal reflux disease)   . HSV-2 (herpes simplex virus 2) infection 2013  . Hx of trichomoniasis   . Itching 06/26/2013  . Leg pain   . Trichimoniasis 06/26/2013  . UTI (lower urinary tract infection) 10/12/2012  . Vaginal discharge 06/26/2013  . Vaginal irritation 12/05/2013  . Vaginal odor 12/28/2012   Had discharge +clue  . Yeast infection 02/04/2013    Past Surgical History:  Procedure Laterality Date  . ABDOMINAL HYSTERECTOMY     total  . BREAST ENHANCEMENT SURGERY Right   . COLONOSCOPY WITH PROPOFOL N/A 12/07/2015   Procedure: COLONOSCOPY WITH PROPOFOL;  Surgeon: Daneil Dolin, MD;  [facility];  Service: Endoscopy;  Laterality: N/A;  915  . TUBAL LIGATION      Family History  Problem Relation Age of Onset  . Hypertension Mother   . Diabetes Mother   . Depression Mother   . Hyperlipidemia Mother   . Fibromyalgia Mother   . Arthritis Mother   . Cancer Father        bone  . Hyperlipidemia Father   . Hypertension Father   . Bipolar disorder Father   . Depression Sister   . Hyperlipidemia Sister   . Fibromyalgia Sister   . Fibromyalgia Sister   . Bipolar disorder Other   . Drug abuse Other   . Alcohol abuse Other   . Hypertension Brother   . Other  Brother        shingles; back problems  . Diabetes Paternal Grandmother   . Alzheimer's disease Maternal Grandmother   . Obesity Daughter   . Other Daughter        back problems  . Bipolar disorder Daughter   . Depression Daughter   . Other Daughter        on pain meds  . Colon cancer Neg Hx     Social History   Socioeconomic History  . Marital status: Widowed    Spouse name: Not on file  . Number of children: Not on file  . Years of education: Not on file  . Highest education level: Not on file  Occupational History  . Occupation: Disabled  Tobacco Use  . Smoking status: Light Tobacco Smoker    Packs/day: 0.25    Years: 25.00    Pack years: 6.25    Types: Cigarettes  . Smokeless tobacco: Never Used  . Tobacco comment: 2 cigarettes daily  Vaping Use  . Vaping Use: Never used  Substance and Sexual Activity  . Alcohol use: Yes    Alcohol/week: 0.0 standard drinks    Comment: occasional  . Drug use: No    Comment: cocaine, clean  for 4.5 years as of 12/04/2015  . Sexual activity: Yes    Birth control/protection: Surgical    Comment: hysterectomy  Other Topics Concern  . Not on file  Social History Narrative  . Not on file   Social Determinants of Health   Financial Resource Strain: Not on file  Food Insecurity: Not on file  Transportation Needs: Not on file  Physical Activity: Not on file  Stress: Not on file  Social Connections: Not on file  Intimate Partner Violence: Not on file    Outpatient Medications Prior to Visit  Medication Sig Dispense Refill  . ALPRAZolam (XANAX) 1 MG tablet TAKE (1) TABLET BY MOUTH (4) TIMES DAILY AS NEEDED FOR ANXIETY. 120 tablet 2  . cetirizine (ZYRTEC) 10 MG tablet TAKE 1 TABLET BY MOUTH ONCE DAILY. 30 tablet 11  . escitalopram (LEXAPRO) 20 MG tablet Take 1.5 tablets (30 mg total) by mouth daily. 45 tablet 2  . fluticasone (FLONASE) 50 MCG/ACT nasal spray INSTILL 2 SPRAYS IN EACH NOSTRIL TWICE DAILY AS DIRECTED. 16 g 11  .  gabapentin (NEURONTIN) 600 MG tablet TAKE 1 TABLET BY MOUTH THREE TIMES DAILY. 90 tablet 3  . lamoTRIgine (LAMICTAL) 100 MG tablet Take 1 tablet (100 mg total) by mouth 2 (two) times daily. 60 tablet 2  . losartan (COZAAR) 50 MG tablet Take 1 tablet (50 mg total) by mouth daily. 90 tablet 1  . metroNIDAZOLE (FLAGYL) 500 MG tablet TAKE (1) TABLET BY MOUTH TWICE DAILY. 14 tablet 0  . nystatin (MYCOSTATIN/NYSTOP) powder APPLY TO THE AFFECTED AREAS FOUR TIMES DAILY AS DIRECTED. 60 g 0  . omeprazole (PRILOSEC) 40 MG capsule TAKE (1) CAPSULE BY MOUTH ONCE DAILY FOR ACID REFLUX. 90 capsule 1  . ondansetron (ZOFRAN-ODT) 4 MG disintegrating tablet Take 1 tablet (4 mg total) by mouth every 8 (eight) hours as needed for nausea or vomiting. 20 tablet 0  . traZODone (DESYREL) 150 MG tablet Take 2 tablets (300 mg total) by mouth at bedtime. 60 tablet 3  . diclofenac sodium (VOLTAREN) 1 % GEL APPLY 2 GRAMS TO THE AFFECTED AREAS FOUR TIMES A DAY AS DIRECTED. 300 g 3  . ibuprofen (ADVIL) 800 MG tablet TAKE 1 TABLET BY MOUTH TWICE DAILY AS NEEDED. 60 tablet 0  . tiZANidine (ZANAFLEX) 4 MG tablet TAKE 1 TABLET BY MOUTH ONCE EVERY 8 HOURS AS NEEDED FOR MUSCLE SPASMS. 20 tablet 0  . traMADol (ULTRAM) 50 MG tablet TAKE 1 TABLET BY MOUTH EVERY 12 HOURS AS NEEDED. 60 tablet 1  . ciprofloxacin (CIPRO) 500 MG tablet Take 1 tablet (500 mg total) by mouth 2 (two) times daily. 14 tablet 0   No facility-administered medications prior to visit.    Allergies  Allergen Reactions  . Amoxicillin Hives  . Hydrocodone Nausea And Vomiting  . Latex Swelling    Ankle Area  . Lisinopril Cough  . Ampicillin Hives and Nausea And Vomiting    ROS Review of Systems  Constitutional: Negative.   Respiratory: Negative.   Cardiovascular: Negative.   Musculoskeletal: Positive for arthralgias.       Chronic pain to bilateral knees and left shoulder  Psychiatric/Behavioral: Negative.       Objective:    Physical  Exam Constitutional:      Appearance: Normal appearance. She is obese.  Cardiovascular:     Rate and Rhythm: Normal rate and regular rhythm.     Pulses: Normal pulses.     Heart sounds: Normal heart sounds.  Pulmonary:  Effort: Pulmonary effort is normal.     Breath sounds: Normal breath sounds.  Musculoskeletal:     Comments: Pain with ROM to left shoulder and bilateral knees  Neurological:     Mental Status: She is alert.  Psychiatric:        Mood and Affect: Mood normal.        Behavior: Behavior normal.        Thought Content: Thought content normal.        Judgment: Judgment normal.     BP 136/81   Pulse 86   Temp 99 F (37.2 C)   Resp 20   Ht _0  (1.6 m)   Wt 280 lb (127 kg)   SpO2 90%   BMI 49.60 kg/m  Wt Readings from Last 3 Encounters:  08/17/20 280 lb (127 kg)  06/02/20 283 lb (128.4 kg)  05/18/20 288 lb (130.6 kg)     Health Maintenance Due  Topic Date Due  . MAMMOGRAM  Never done  . COVID-19 Vaccine (3 - Booster for Moderna series) 06/14/2020    There are no preventive care reminders to display for this patient.  Lab Results  Component Value Date   TSH 0.430 (L) 08/12/2020   Lab Results  Component Value Date   WBC 6.2 08/12/2020   HGB 12.6 08/12/2020   HCT 37.8 08/12/2020   MCV 90 08/12/2020   PLT 283 08/12/2020   Lab Results  Component Value Date   NA 137 08/12/2020   K 4.7 08/12/2020   CO2 26 08/12/2020   GLUCOSE 89 08/12/2020   BUN 20 08/12/2020   CREATININE 0.94 08/12/2020   BILITOT <0.2 08/12/2020   ALKPHOS 78 08/12/2020   AST 17 08/12/2020   ALT 9 08/12/2020   PROT 6.8 08/12/2020   ALBUMIN 4.3 08/12/2020   CALCIUM 8.9 08/12/2020   ANIONGAP 6 12/24/2017   EGFR 70 08/12/2020   Lab Results  Component Value Date   CHOL 162 08/12/2020   Lab Results  Component Value Date   HDL 54 08/12/2020   Lab Results  Component Value Date   LDLCALC 88 08/12/2020   Lab Results  Component Value Date   TRIG 112 08/12/2020    Lab Results  Component Value Date   CHOLHDL 2.5 12/02/2019   Lab Results  Component Value Date   HGBA1C 5.2 08/12/2020      Assessment & Plan:   Problem List Items Addressed This Visit      Cardiovascular and Mediastinum   Hypertension    -well controlled today        Endocrine   Subclinical hyperthyroidism    Lab Results  Component Value Date   TSH 0.430 (L) 08/12/2020  -improved from 0.35 previously -recheck in 3 months       Relevant Orders   TSH + free T4   T3, free     Musculoskeletal and Integument   OA (osteoarthritis) of knee    -refilled meds -referral to ortho      Relevant Medications   ibuprofen (ADVIL) 800 MG tablet   tiZANidine (ZANAFLEX) 4 MG tablet   traMADol (ULTRAM) 50 MG tablet (Start on 08/23/2020)     Other   Morbid obesity ([place])    -BMI 49 -she states she needs to lose weight to be candidate for ortho surgery -referral to weight mgmt      Relevant Orders   Amb Ref to Medical Weight Management   High risk sexual behavior - Primary    -  will get HIV testing today; was called and told she was exposed; last intercourse was in Jan 2022      Relevant Orders   HIV Antibody (routine testing w rflx)    Other Visit Diagnoses    Chronic pain of both knees       Relevant Medications   ibuprofen (ADVIL) 800 MG tablet   tiZANidine (ZANAFLEX) 4 MG tablet   traMADol (ULTRAM) 50 MG tablet (Start on 08/23/2020)   Other Relevant Orders   Ambulatory referral to Orthopedic Surgery   Chronic left shoulder pain       Relevant Medications   ibuprofen (ADVIL) 800 MG tablet   tiZANidine (ZANAFLEX) 4 MG tablet   traMADol (ULTRAM) 50 MG tablet (Start on 08/23/2020)   Other Relevant Orders   Ambulatory referral to Orthopedic Surgery      Meds ordered this encounter  Medications  . diclofenac Sodium (VOLTAREN) 1 % GEL    Sig: Apply 2 g topically 4 (four) times daily.    Dispense:  100 g    Refill:  1  . ibuprofen (ADVIL) 800 MG tablet    Sig:  Take 1 tablet (800 mg total) by mouth 2 (two) times daily as needed.    Dispense:  60 tablet    Refill:  0  . tiZANidine (ZANAFLEX) 4 MG tablet    Sig: Take 1 tablet (4 mg total) by mouth every 8 (eight) hours as needed for muscle spasms.    Dispense:  90 tablet    Refill:  1  . traMADol (ULTRAM) 50 MG tablet    Sig: Take 1 tablet (50 mg total) by mouth every 12 (twelve) hours as needed.    Dispense:  60 tablet    Refill:  1    Follow-up: Return in about 3 months (around 11/17/2020).    Noreene Larsson, NP

## 2020-08-17 NOTE — Patient Instructions (Signed)
[facility] have HIV testing today.  We will recheck thyroid labs in 3 months. No need to fast for thyroid labs.

## 2020-08-17 NOTE — Assessment & Plan Note (Signed)
-  refilled meds -referral to ortho

## 2020-08-17 NOTE — Assessment & Plan Note (Signed)
-  will get HIV testing today; was called and told she was exposed; last intercourse was in Jan 2022

## 2020-08-18 ENCOUNTER — Telehealth: Payer: Self-pay

## 2020-08-18 ENCOUNTER — Other Ambulatory Visit: Payer: Self-pay

## 2020-08-18 DIAGNOSIS — I10 Essential (primary) hypertension: Secondary | ICD-10-CM

## 2020-08-18 MED ORDER — LOSARTAN POTASSIUM 50 MG PO TABS: 50.0000 mg | ORAL_TABLET | Freq: Every day | ORAL | 1 refills | Status: DC

## 2020-08-18 NOTE — Telephone Encounter (Signed)
Rx refill sent to [facility]. 

## 2020-08-18 NOTE — Telephone Encounter (Signed)
Patient called need med refill losartan (COZAAR) 50 MG tablet   Pharmacy: [facility]

## 2020-08-19 LAB — HIV ANTIBODY (ROUTINE TESTING W REFLEX): HIV Screen 4th Generation wRfx: NONREACTIVE

## 2020-08-19 NOTE — Progress Notes (Signed)
HIV test is negative.

## 2020-08-20 ENCOUNTER — Ambulatory Visit (INDEPENDENT_AMBULATORY_CARE_PROVIDER_SITE_OTHER): Payer: Medicare Other

## 2020-08-20 ENCOUNTER — Other Ambulatory Visit: Payer: Self-pay

## 2020-08-20 VITALS — Ht 64.0 in | Wt 280.0 lb

## 2020-08-20 DIAGNOSIS — Z0000 Encounter for general adult medical examination without abnormal findings: Secondary | ICD-10-CM | POA: Diagnosis not present

## 2020-08-20 NOTE — Patient Instructions (Addendum)
Annette Galvan , Thank you for taking time to come for your Medicare Wellness Visit. I appreciate your ongoing commitment to your health goals. Please review the following plan we discussed and let me know if I can assist you in the future.   Screening recommendations/referrals: Colonoscopy: 12/06/25 Mammogram: Declined Bone Density: Age 60 Recommended yearly ophthalmology/optometry visit for glaucoma screening and checkup Recommended yearly dental visit for hygiene and checkup  Vaccinations: Influenza vaccine: Fall 2022 Pneumococcal vaccine: Declined Tdap vaccine: 11/11/24 Shingles vaccine: Declined  Advanced directives: No  Conditions/risks identified: None  Next appointment: No future appointments scheduled at this time.  Preventive Care 40-64 Years, Female Preventive care refers to lifestyle choices and visits with your health care provider that can promote health and wellness. What does preventive care include?  A yearly physical exam. This is also called an annual well check.  Dental exams once or twice a year.  Routine eye exams. Ask your health care provider how often you should have your eyes checked.  Personal lifestyle choices, including:  Daily care of your teeth and gums.  Regular physical activity.  Eating a healthy diet.  Avoiding tobacco and drug use.  Limiting alcohol use.  Practicing safe sex.  Taking low-dose aspirin daily starting at age 21.  Taking vitamin and mineral supplements as recommended by your health care provider. What happens during an annual well check? The services and screenings done by your health care provider during your annual well check will depend on your age, overall health, lifestyle risk factors, and family history of disease. Counseling  Your health care provider may ask you questions about your:  Alcohol use.  Tobacco use.  Drug use.  Emotional well-being.  Home and relationship well-being.  Sexual  activity.  Eating habits.  Work and work Statistician.  Method of birth control.  Menstrual cycle.  Pregnancy history. Screening  You may have the following tests or measurements:  Height, weight, and BMI.  Blood pressure.  Lipid and cholesterol levels. These may be checked every 5 years, or more frequently if you are over 28 years old.  Skin check.  Lung cancer screening. You may have this screening every year starting at age 62 if you have a 30-pack-year history of smoking and currently smoke or have quit within the past 15 years.  Fecal occult blood test (FOBT) of the stool. You may have this test every year starting at age 49.  Flexible sigmoidoscopy or colonoscopy. You may have a sigmoidoscopy every 5 years or a colonoscopy every 10 years starting at age 69.  Hepatitis C blood test.  Hepatitis B blood test.  Sexually transmitted disease (STD) testing.  Diabetes screening. This is done by checking your blood sugar (glucose) after you have not eaten for a while (fasting). You may have this done every 1-3 years.  Mammogram. This may be done every 1-2 years. Talk to your health care provider about when you should start having regular mammograms. This may depend on whether you have a family history of breast cancer.  BRCA-related cancer screening. This may be done if you have a family history of breast, ovarian, tubal, or peritoneal cancers.  Pelvic exam and Pap test. This may be done every 3 years starting at age 46. Starting at age 79, this may be done every 5 years if you have a Pap test in combination with an HPV test.  Bone density scan. This is done to screen for osteoporosis. You may have this scan if you  are at high risk for osteoporosis. Discuss your test results, treatment options, and if necessary, the need for more tests with your health care provider. Vaccines  Your health care provider may recommend certain vaccines, such as:  Influenza vaccine. This is  recommended every year.  Tetanus, diphtheria, and acellular pertussis (Tdap, Td) vaccine. You may need a Td booster every 10 years.  Zoster vaccine. You may need this after age 21.  Pneumococcal 13-valent conjugate (PCV13) vaccine. You may need this if you have certain conditions and were not previously vaccinated.  Pneumococcal polysaccharide (PPSV23) vaccine. You may need one or two doses if you smoke cigarettes or if you have certain conditions. Talk to your health care provider about which screenings and vaccines you need and how often you need them. This information is not intended to replace advice given to you by your health care provider. Make sure you discuss any questions you have with your health care provider. Document Released: 05/01/2015 Document Revised: 12/23/2015 Document Reviewed: 02/03/2015 Elsevier Interactive Patient Education  2017 [employer] Prevention in the Home Falls can cause injuries. They can happen to people of all ages. There are many things you can do to make your home safe and to help prevent falls. What can I do on the outside of my home?  Regularly fix the edges of walkways and driveways and fix any cracks.  Remove anything that might make you trip as you walk through a door, such as a raised step or threshold.  Trim any bushes or trees on the path to your home.  Use bright outdoor lighting.  Clear any walking paths of anything that might make someone trip, such as rocks or tools.  Regularly check to see if handrails are loose or broken. Make sure that both sides of any steps have handrails.  Any raised decks and porches should have guardrails on the edges.  Have any leaves, snow, or ice cleared regularly.  Use sand or salt on walking paths during winter.  Clean up any spills in your garage right away. This includes oil or grease spills. What can I do in the bathroom?  Use night lights.  Install grab bars by the toilet and in  the tub and shower. Do not use towel bars as grab bars.  Use non-skid mats or decals in the tub or shower.  If you need to sit down in the shower, use a plastic, non-slip stool.  Keep the floor dry. Clean up any water that spills on the floor as soon as it happens.  Remove soap buildup in the tub or shower regularly.  Attach bath mats securely with double-sided non-slip rug tape.  Do not have throw rugs and other things on the floor that can make you trip. What can I do in the bedroom?  Use night lights.  Make sure that you have a light by your bed that is easy to reach.  Do not use any sheets or blankets that are too big for your bed. They should not hang down onto the floor.  Have a firm chair that has side arms. You can use this for support while you get dressed.  Do not have throw rugs and other things on the floor that can make you trip. What can I do in the kitchen?  Clean up any spills right away.  Avoid walking on wet floors.  Keep items that you use a lot in easy-to-reach places.  If you need  to reach something above you, use a strong step stool that has a grab bar.  Keep electrical cords out of the way.  Do not use floor polish or wax that makes floors slippery. If you must use wax, use non-skid floor wax.  Do not have throw rugs and other things on the floor that can make you trip. What can I do with my stairs?  Do not leave any items on the stairs.  Make sure that there are handrails on both sides of the stairs and use them. Fix handrails that are broken or loose. Make sure that handrails are as long as the stairways.  Check any carpeting to make sure that it is firmly attached to the stairs. Fix any carpet that is loose or worn.  Avoid having throw rugs at the top or bottom of the stairs. If you do have throw rugs, attach them to the floor with carpet tape.  Make sure that you have a light switch at the top of the stairs and the bottom of the stairs. If  you do not have them, ask someone to add them for you. What else can I do to help prevent falls?  Wear shoes that:  Do not have high heels.  Have rubber bottoms.  Are comfortable and fit you well.  Are closed at the toe. Do not wear sandals.  If you use a stepladder:  Make sure that it is fully opened. Do not climb a closed stepladder.  Make sure that both sides of the stepladder are locked into place.  Ask someone to hold it for you, if possible.  Clearly mark and make sure that you can see:  Any grab bars or handrails.  First and last steps.  Where the edge of each step is.  Use tools that help you move around (mobility aids) if they are needed. These include:  Canes.  Walkers.  Scooters.  Crutches.  Turn on the lights when you go into a dark area. Replace any light bulbs as soon as they burn out.  Set up your furniture so you have a clear path. Avoid moving your furniture around.  If any of your floors are uneven, fix them.  If there are any pets around you, be aware of where they are.  Review your medicines with your doctor. Some medicines can make you feel dizzy. This can increase your chance of falling. Ask your doctor what other things that you can do to help prevent falls. This information is not intended to replace advice given to you by your health care provider. Make sure you discuss any questions you have with your health care provider. Document Released: 01/29/2009 Document Revised: 09/10/2015 Document Reviewed: 05/09/2014 Elsevier Interactive Patient Education  2017 [employer].

## 2020-08-20 NOTE — Progress Notes (Signed)
Subjective:   Annette Galvan is a 60 y.o. female who presents for Medicare Annual (Subsequent) preventive examination.  Review of Systems       Objective:    There were no vitals filed for this visit. There is no height or weight on file to calculate BMI.  Advanced Directives 12/20/2019 05/05/2019 03/13/2019 01/25/2018 12/24/2017 11/05/2016 08/24/2016  Does Patient Have a Medical Advance Directive? No No No No No No No  Would patient like information on creating a medical advance directive? No - Patient declined No - Patient declined No - Patient declined No - Patient declined No - Patient declined - -    Current Medications (verified) Outpatient Encounter Medications as of 08/20/2020  Medication Sig  . ALPRAZolam (XANAX) 1 MG tablet TAKE (1) TABLET BY MOUTH (4) TIMES DAILY AS NEEDED FOR ANXIETY.  . cetirizine (ZYRTEC) 10 MG tablet TAKE 1 TABLET BY MOUTH ONCE DAILY.  Marland Kitchen diclofenac Sodium (VOLTAREN) 1 % GEL Apply 2 g topically 4 (four) times daily.  Marland Kitchen escitalopram (LEXAPRO) 20 MG tablet Take 1.5 tablets (30 mg total) by mouth daily.  . fluticasone (FLONASE) 50 MCG/ACT nasal spray INSTILL 2 SPRAYS IN EACH NOSTRIL TWICE DAILY AS DIRECTED.  Marland Kitchen gabapentin (NEURONTIN) 600 MG tablet TAKE 1 TABLET BY MOUTH THREE TIMES DAILY.  Marland Kitchen ibuprofen (ADVIL) 800 MG tablet Take 1 tablet (800 mg total) by mouth 2 (two) times daily as needed.  . lamoTRIgine (LAMICTAL) 100 MG tablet Take 1 tablet (100 mg total) by mouth 2 (two) times daily.  Marland Kitchen losartan (COZAAR) 50 MG tablet Take 1 tablet (50 mg total) by mouth daily.  . metroNIDAZOLE (FLAGYL) 500 MG tablet TAKE (1) TABLET BY MOUTH TWICE DAILY.  Marland Kitchen nystatin (MYCOSTATIN/NYSTOP) powder APPLY TO THE AFFECTED AREAS FOUR TIMES DAILY AS DIRECTED.  Marland Kitchen omeprazole (PRILOSEC) 40 MG capsule TAKE (1) CAPSULE BY MOUTH ONCE DAILY FOR ACID REFLUX.  Marland Kitchen ondansetron (ZOFRAN-ODT) 4 MG disintegrating tablet Take 1 tablet (4 mg total) by mouth every 8 (eight) hours as needed for nausea or  vomiting.  Marland Kitchen tiZANidine (ZANAFLEX) 4 MG tablet Take 1 tablet (4 mg total) by mouth every 8 (eight) hours as needed for muscle spasms.  Derrill Memo ON 08/23/2020] traMADol (ULTRAM) 50 MG tablet Take 1 tablet (50 mg total) by mouth every 12 (twelve) hours as needed.  . traZODone (DESYREL) 150 MG tablet Take 2 tablets (300 mg total) by mouth at bedtime.   No facility-administered encounter medications on file as of 08/20/2020.    Allergies (verified) Amoxicillin, Hydrocodone, Latex, Lisinopril, and Ampicillin   History: Past Medical History:  Diagnosis Date  . Arthritis    needs 2 knee replacements; also has left shoulder and left hip pain  . Back pain, chronic   . Bilateral chronic knee pain   . Bipolar 1 disorder ([place])   . Bulging disc   . Fibromyalgia   . GERD (gastroesophageal reflux disease)   . HSV-2 (herpes simplex virus 2) infection 2013  . Hx of trichomoniasis   . Itching 06/26/2013  . Leg pain   . Trichimoniasis 06/26/2013  . UTI (lower urinary tract infection) 10/12/2012  . Vaginal discharge 06/26/2013  . Vaginal irritation 12/05/2013  . Vaginal odor 12/28/2012   Had discharge +clue  . Yeast infection 02/04/2013   Past Surgical History:  Procedure Laterality Date  . ABDOMINAL HYSTERECTOMY     total  . BREAST ENHANCEMENT SURGERY Right   . COLONOSCOPY WITH PROPOFOL N/A 12/07/2015   Procedure:  COLONOSCOPY WITH PROPOFOL;  Surgeon: Corbin Ade, MD;  Location: [facility];  Service: Endoscopy;  Laterality: N/A;  915  . TUBAL LIGATION     Family History  Problem Relation Age of Onset  . Hypertension Mother   . Diabetes Mother   . Depression Mother   . Hyperlipidemia Mother   . Fibromyalgia Mother   . Arthritis Mother   . Cancer Father        bone  . Hyperlipidemia Father   . Hypertension Father   . Bipolar disorder Father   . Depression Sister   . Hyperlipidemia Sister   . Fibromyalgia Sister   . Fibromyalgia Sister   . Bipolar disorder Other   . Drug abuse Other    . Alcohol abuse Other   . Hypertension Brother   . Other Brother        shingles; back problems  . Diabetes Paternal Grandmother   . Alzheimer's disease Maternal Grandmother   . Obesity Daughter   . Other Daughter        back problems  . Bipolar disorder Daughter   . Depression Daughter   . Other Daughter        on pain meds  . Colon cancer Neg Hx    Social History   Socioeconomic History  . Marital status: Widowed    Spouse name: Not on file  . Number of children: Not on file  . Years of education: Not on file  . Highest education level: Not on file  Occupational History  . Occupation: Disabled  Tobacco Use  . Smoking status: Light Tobacco Smoker    Packs/day: 0.25    Years: 25.00    Pack years: 6.25    Types: Cigarettes  . Smokeless tobacco: Never Used  . Tobacco comment: 2 cigarettes daily  Vaping Use  . Vaping Use: Never used  Substance and Sexual Activity  . Alcohol use: Yes    Alcohol/week: 0.0 standard drinks    Comment: occasional  . Drug use: No    Comment: cocaine, clean for 4.5 years as of 12/04/2015  . Sexual activity: Yes    Birth control/protection: Surgical    Comment: hysterectomy  Other Topics Concern  . Not on file  Social History Narrative  . Not on file   Social Determinants of Health   Financial Resource Strain: Not on file  Food Insecurity: Not on file  Transportation Needs: Not on file  Physical Activity: Not on file  Stress: Not on file  Social Connections: Not on file    Tobacco Counseling Ready to quit: Not Answered Counseling given: Not Answered Comment: 2 cigarettes daily   Clinical Intake:                 Diabetic? no         Activities of Daily Living No flowsheet data found.  Patient Care Team: Heather Roberts, NP as PCP - General (Nurse Practitioner) Corbin Ade, MD as Consulting Physician (Gastroenterology)  Indicate any recent Medical Services you may have received from other than [facility]  providers in the past year (date may be approximate).     Assessment:   This is a routine wellness examination for Annette Galvan.  Hearing/Vision screen No exam data present  Dietary issues and exercise activities discussed:    Goals Addressed   None    Depression Screen PHQ 2/9 Scores 08/17/2020 06/02/2020 03/13/2019 01/25/2018 01/09/2017 11/16/2016 11/16/2016  PHQ - 2 Score 1 2 3  2 3 0 0  PHQ- 9 Score - 3 10 11 6  - -  Some encounter information is confidential and restricted. Go to Review Flowsheets activity to see all data.    Fall Risk Fall Risk  08/17/2020 06/02/2020 03/13/2019 01/25/2018 11/16/2016  Falls in the past year? 0 0 0 No No  Number falls in past yr: 0 0 - - -  Injury with Fall? 0 0 - - -  Risk for fall due to : No Fall Risks No Fall Risks - - -  Follow up Falls evaluation completed Falls evaluation completed Falls evaluation completed - -    FALL RISK PREVENTION PERTAINING TO THE HOME:  Any stairs in or around the home? No  If so, are there any without handrails? n/a Home free of loose throw rugs in walkways, pet beds, electrical cords, etc? Yes  Adequate lighting in your home to reduce risk of falls? Yes   ASSISTIVE DEVICES UTILIZED TO PREVENT FALLS:  Life alert? Yes  Use of a cane, walker or w/c? Yes  Grab bars in the bathroom? No  Shower chair or bench in shower? No  Elevated toilet seat or a handicapped toilet? Yes   TIMED UP AND GO:  Was the test performed? No .  Length of time to ambulate n/a    Cognitive Function:        Immunizations Immunization History  Administered Date(s) Administered  . Influenza Inj Mdck Quad With Preservative 05/08/2017  . Influenza Split 04/23/2012  . Influenza,inj,Quad PF,6+ Mos 01/09/2017, 01/25/2018, 03/13/2019, 05/18/2020  . Influenza,trivalent, recombinat, inj, PF 01/28/2013  . Influenza-Unspecified 05/08/2017  . Moderna Sars-Covid-2 Vaccination 11/15/2019, 12/13/2019  . Pneumococcal Polysaccharide-23 05/26/2014  .  Tdap 11/12/2014    TDAP status: Up to date  Flu Vaccine status: Up to date  Pneumococcal vaccine status: Declined,  Education has been provided regarding the importance of this vaccine but patient still declined. Advised may receive this vaccine at local pharmacy or Health Dept. Aware to provide a copy of the vaccination record if obtained from local pharmacy or Health Dept. Verbalized acceptance and understanding.   Covid-19 vaccine status: Completed vaccines  Qualifies for Shingles Vaccine? Yes   Zostavax completed No   Shingrix Completed?: No.    Education has been provided regarding the importance of this vaccine. Patient has been advised to call insurance company to determine out of pocket expense if they have not yet received this vaccine. Advised may also receive vaccine at local pharmacy or Health Dept. Verbalized acceptance and understanding.  Screening Tests Health Maintenance  Topic Date Due  . MAMMOGRAM  Never done  . COVID-19 Vaccine (3 - Booster for Moderna series) 06/14/2020  . INFLUENZA VACCINE  11/16/2020  . TETANUS/TDAP  11/11/2024  . COLONOSCOPY (Pts 45-18yrs Insurance coverage will need to be confirmed)  12/06/2025  . Hepatitis C Screening  Completed  . HIV Screening  Completed  . HPV VACCINES  Aged Out    Health Maintenance  Health Maintenance Due  Topic Date Due  . MAMMOGRAM  Never done  . COVID-19 Vaccine (3 - Booster for Moderna series) 06/14/2020    Colonoscopy: 12/07/15  Mammogram: Declined  Bone Dexa: Declined  Lung Cancer Screening: (Low Dose CT Chest recommended if Age 82-80 years, 30 pack-year currently smoking OR have quit w/in 15years.) does qualify.   Lung Cancer Screening Referral: declined  Additional Screening:  Hepatitis C Screening: does not qualify; Completed   Vision Screening: Recommended annual ophthalmology exams for early  detection of glaucoma and other disorders of the eye. Is the patient up to date with their annual eye  exam?  Yes  Who is the provider or what is the name of the office in which the patient attends annual eye exams?  If pt is not established with a provider, would they like to be referred to a provider to establish care? n/a.   Dental Screening: Recommended annual dental exams for proper oral hygiene  Community Resource Referral / Chronic Care Management: CRR required this visit?  No   CCM required this visit?  No      Plan:     I have personally reviewed and noted the following in the patient's chart:   . Medical and social history . Use of alcohol, tobacco or illicit drugs  . Current medications and supplements including opioid prescriptions.  . Functional ability and status . Nutritional status . Physical activity . Advanced directives . List of other physicians . Hospitalizations, surgeries, and ER visits in previous 12 months . Vitals . Screenings to include cognitive, depression, and falls . Referrals and appointments  In addition, I have reviewed and discussed with patient certain preventive protocols, quality metrics, and best practice recommendations. A written personalized care plan for preventive services as well as general preventive health recommendations were provided to patient.     Laretta Bolster, [place]   08/23/996   Nurse Notes: AWV conducted by nurse in office by phone. Patient gave consent to telehealth visit via audio. Patient at home at the time of this vist. Provider here in the office at the time of this visit. Visit took 25 minutes to complete.

## 2020-08-25 ENCOUNTER — Ambulatory Visit: Payer: Medicare Other | Admitting: Orthopaedic Surgery

## 2020-09-03 ENCOUNTER — Ambulatory Visit: Payer: Medicare Other | Admitting: Orthopaedic Surgery

## 2020-09-08 ENCOUNTER — Ambulatory Visit: Payer: Medicare Other | Admitting: Orthopaedic Surgery

## 2020-09-15 ENCOUNTER — Other Ambulatory Visit: Payer: Self-pay | Admitting: Nurse Practitioner

## 2020-09-15 DIAGNOSIS — M170 Bilateral primary osteoarthritis of knee: Secondary | ICD-10-CM

## 2020-09-15 MED ORDER — IBUPROFEN 600 MG PO TABS: 600.0000 mg | ORAL_TABLET | Freq: Three times a day (TID) | ORAL | 0 refills | Status: DC | PRN

## 2020-09-17 ENCOUNTER — Telehealth (INDEPENDENT_AMBULATORY_CARE_PROVIDER_SITE_OTHER): Payer: Medicare Other | Admitting: Psychiatry

## 2020-09-17 ENCOUNTER — Encounter (HOSPITAL_COMMUNITY): Payer: Self-pay | Admitting: Psychiatry

## 2020-09-17 ENCOUNTER — Other Ambulatory Visit: Payer: Self-pay

## 2020-09-17 DIAGNOSIS — F4310 Post-traumatic stress disorder, unspecified: Secondary | ICD-10-CM

## 2020-09-17 MED ORDER — LAMOTRIGINE 100 MG PO TABS: 100.0000 mg | ORAL_TABLET | Freq: Two times a day (BID) | ORAL | 2 refills | Status: DC

## 2020-09-17 MED ORDER — TRAZODONE HCL 150 MG PO TABS: 300.0000 mg | ORAL_TABLET | Freq: Every day | ORAL | 3 refills | Status: DC

## 2020-09-17 MED ORDER — ALPRAZOLAM 1 MG PO TABS: ORAL_TABLET | ORAL | 2 refills | Status: DC

## 2020-09-17 MED ORDER — ESCITALOPRAM OXALATE 20 MG PO TABS: 30.0000 mg | ORAL_TABLET | Freq: Every day | ORAL | 2 refills | Status: DC
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
## 2020-10-13 ENCOUNTER — Other Ambulatory Visit: Payer: Self-pay | Admitting: Nurse Practitioner

## 2020-10-13 DIAGNOSIS — M170 Bilateral primary osteoarthritis of knee: Secondary | ICD-10-CM

## 2020-10-14 ENCOUNTER — Other Ambulatory Visit: Payer: Self-pay | Admitting: Nurse Practitioner

## 2020-10-14 ENCOUNTER — Other Ambulatory Visit: Payer: Self-pay | Admitting: Family Medicine

## 2020-10-20 ENCOUNTER — Other Ambulatory Visit: Payer: Self-pay | Admitting: Nurse Practitioner

## 2020-10-20 ENCOUNTER — Other Ambulatory Visit: Payer: Self-pay

## 2020-10-20 ENCOUNTER — Ambulatory Visit (INDEPENDENT_AMBULATORY_CARE_PROVIDER_SITE_OTHER): Payer: Medicare Other | Admitting: Nurse Practitioner

## 2020-10-20 ENCOUNTER — Encounter: Payer: Self-pay | Admitting: Nurse Practitioner

## 2020-10-20 VITALS — BP 130/68 | HR 80 | Temp 96.9°F | Ht 64.0 in | Wt 277.0 lb

## 2020-10-20 DIAGNOSIS — E66813 Obesity, class 3: Secondary | ICD-10-CM

## 2020-10-20 DIAGNOSIS — E669 Obesity, unspecified: Secondary | ICD-10-CM

## 2020-10-20 DIAGNOSIS — I10 Essential (primary) hypertension: Secondary | ICD-10-CM | POA: Diagnosis not present

## 2020-10-20 DIAGNOSIS — G8929 Other chronic pain: Secondary | ICD-10-CM | POA: Diagnosis not present

## 2020-10-20 DIAGNOSIS — M25512 Pain in left shoulder: Secondary | ICD-10-CM | POA: Insufficient documentation

## 2020-10-20 DIAGNOSIS — G629 Polyneuropathy, unspecified: Secondary | ICD-10-CM | POA: Diagnosis not present

## 2020-10-20 DIAGNOSIS — R7303 Prediabetes: Secondary | ICD-10-CM | POA: Diagnosis not present

## 2020-10-20 DIAGNOSIS — E0590 Thyrotoxicosis, unspecified without thyrotoxic crisis or storm: Secondary | ICD-10-CM | POA: Diagnosis not present

## 2020-10-20 DIAGNOSIS — Z1231 Encounter for screening mammogram for malignant neoplasm of breast: Secondary | ICD-10-CM

## 2020-10-20 DIAGNOSIS — M797 Fibromyalgia: Secondary | ICD-10-CM | POA: Diagnosis not present

## 2020-10-20 DIAGNOSIS — R202 Paresthesia of skin: Secondary | ICD-10-CM

## 2020-10-20 MED ORDER — DICLOFENAC SODIUM 1 % EX GEL: 2.0000 g | Freq: Four times a day (QID) | CUTANEOUS | 0 refills | Status: DC

## 2020-10-20 MED ORDER — IBUPROFEN 800 MG PO TABS: 800.0000 mg | ORAL_TABLET | Freq: Three times a day (TID) | ORAL | 0 refills | Status: DC | PRN

## 2020-10-20 NOTE — Patient Instructions (Addendum)
I ordered a mammogram to screen for breast cancer.  Please have fasting labs drawn today.  The ortho and neurology offices will call you to set up an appointment.

## 2020-10-20 NOTE — Progress Notes (Signed)
Established Patient Office Visit  Subjective:  Patient ID: Annette Galvan, female    DOB: Sep 13, 1960  Age: 60 y.o. MRN: [id number]  CC:  Chief Complaint  Patient presents with   Annual Exam    CPE   Tingling    Pins and needles sensation in legs and hands, ongoing intermittently x2 months now.    HPI Annette Galvan presents for physical exam.  She states that her legs have been having a numb and tingling feeling for 2 months. She states it feels like falling in a briar bush for a few seconds.  Sometimes her pain is constant, but sometimes it comes and goes. She has been taking gabapentin, but it isn't helping much.  She has been taking ibuprofen 800 mg, and that went down to 600 mg. She has added some tylenol. She also takes diclofenac gel for her knees.  Past Medical History:  Diagnosis Date   Arthritis    needs 2 knee replacements; also has left shoulder and left hip pain   Back pain, chronic    Bilateral chronic knee pain    Bipolar 1 disorder ([facility])    Bulging disc    Fibromyalgia    GERD (gastroesophageal reflux disease)    HSV-2 (herpes simplex virus 2) infection 2013   Hx of trichomoniasis    Itching 06/26/2013   Leg pain    Trichimoniasis 06/26/2013   UTI (lower urinary tract infection) 10/12/2012   Vaginal discharge 06/26/2013   Vaginal irritation 12/05/2013   Vaginal odor 12/28/2012   Had discharge +clue   Yeast infection 02/04/2013    Past Surgical History:  Procedure Laterality Date   ABDOMINAL HYSTERECTOMY     total   BREAST ENHANCEMENT SURGERY Right    COLONOSCOPY WITH PROPOFOL N/A 12/07/2015   Procedure: COLONOSCOPY WITH PROPOFOL;  Surgeon: Daneil Dolin, MD;  [facility];  Service: Endoscopy;  Laterality: N/A;  45   TUBAL LIGATION      Family History  Problem Relation Age of Onset   Hypertension Mother    Diabetes Mother    Depression Mother    Hyperlipidemia Mother    Fibromyalgia Mother    Arthritis Mother    Cancer Father         bone   Hyperlipidemia Father    Hypertension Father    Bipolar disorder Father    Depression Sister    Hyperlipidemia Sister    Fibromyalgia Sister    Fibromyalgia Sister    Bipolar disorder Other    Drug abuse Other    Alcohol abuse Other    Hypertension Brother    Other Brother        shingles; back problems   Diabetes Paternal Grandmother    Alzheimer's disease Maternal Grandmother    Obesity Daughter    Other Daughter        back problems   Bipolar disorder Daughter    Depression Daughter    Other Daughter        on pain meds   Colon cancer Neg Hx     Social History   Socioeconomic History   Marital status: Widowed    Spouse name: Not on file   Number of children: Not on file   Years of education: Not on file   Highest education level: Not on file  Occupational History   Occupation: Disabled  Tobacco Use   Smoking status: Light Smoker    Packs/day: 0.25    Years:  25.00    Pack years: 6.25    Types: Cigarettes   Smokeless tobacco: Never   Tobacco comments:    2 cigarettes daily  Vaping Use   Vaping Use: Never used  Substance and Sexual Activity   Alcohol use: Yes    Alcohol/week: 0.0 standard drinks    Comment: occasional   Drug use: No    Comment: cocaine, clean for 4.5 years as of 12/04/2015   Sexual activity: Yes    Birth control/protection: Surgical    Comment: hysterectomy  Other Topics Concern   Not on file  Social History Narrative   Not on file   Social Determinants of Health   Financial Resource Strain: Low Risk    Difficulty of Paying Living Expenses: Not hard at all  Food Insecurity: No Food Insecurity   Worried About Charity fundraiser in the Last Year: Never true   [place] in the Last Year: Never true  Transportation Needs: No Transportation Needs   Lack of Transportation (Medical): No   Lack of Transportation (Non-Medical): No  Physical Activity: Inactive   Days of Exercise per Week: 0 days   Minutes of Exercise per  Session: 0 min  Stress: No Stress Concern Present   Feeling of Stress : Not at all  Social Connections: Socially Isolated   Frequency of Communication with Friends and Family: More than three times a week   Frequency of Social Gatherings with Friends and Family: Once a week   Attends Religious Services: Never   Marine scientist or Organizations: No   Attends Archivist Meetings: Never   Marital Status: Widowed  Human resources officer Violence: Not At Risk   Fear of Current or Ex-Partner: No   Emotionally Abused: No   Physically Abused: No   Sexually Abused: No    Outpatient Medications Prior to Visit  Medication Sig Dispense Refill   ALPRAZolam (XANAX) 1 MG tablet TAKE (1) TABLET BY MOUTH (4) TIMES DAILY AS NEEDED FOR ANXIETY. 120 tablet 2   cetirizine (ZYRTEC) 10 MG tablet TAKE 1 TABLET BY MOUTH ONCE DAILY. 30 tablet 11   escitalopram (LEXAPRO) 20 MG tablet Take 1.5 tablets (30 mg total) by mouth daily. 45 tablet 2   fluticasone (FLONASE) 50 MCG/ACT nasal spray INSTILL 2 SPRAYS IN EACH NOSTRIL TWICE DAILY AS DIRECTED. 16 g 11   gabapentin (NEURONTIN) 600 MG tablet TAKE 1 TABLET BY MOUTH THREE TIMES DAILY. 90 tablet 0   lamoTRIgine (LAMICTAL) 100 MG tablet Take 1 tablet (100 mg total) by mouth 2 (two) times daily. 60 tablet 2   losartan (COZAAR) 50 MG tablet Take 1 tablet (50 mg total) by mouth daily. 90 tablet 1   nystatin (MYCOSTATIN/NYSTOP) powder APPLY TO THE AFFECTED AREAS FOUR TIMES DAILY AS DIRECTED. 60 g 0   omeprazole (PRILOSEC) 40 MG capsule TAKE (1) CAPSULE BY MOUTH ONCE DAILY FOR ACID REFLUX. 90 capsule 1   ondansetron (ZOFRAN-ODT) 4 MG disintegrating tablet Take 1 tablet (4 mg total) by mouth every 8 (eight) hours as needed for nausea or vomiting. 20 tablet 0   tiZANidine (ZANAFLEX) 4 MG tablet TAKE 1 TABLET BY MOUTH ONCE EVERY 8 HOURS AS NEEDED FOR MUSCLE SPASMS. 90 tablet 0   traMADol (ULTRAM) 50 MG tablet Take 1 tablet (50 mg total) by mouth every 12 (twelve)  hours as needed. 60 tablet 1   traZODone (DESYREL) 150 MG tablet Take 2 tablets (300 mg total) by mouth at bedtime. [place]  tablet 3   diclofenac Sodium (VOLTAREN) 1 % GEL APPLY 2 GRAMS TO THE AFFECTED AREAS FOUR TIMES A DAY. 100 g 0   IBU 600 MG tablet TAKE 1 TABLET BY MOUTH EVERY 8 HOURS AS NEEDED FOR HEADACHE, MILD PAIN, OR MODERATE PAIN 30 tablet 0   No facility-administered medications prior to visit.    Allergies  Allergen Reactions   Amoxicillin Hives   Hydrocodone Nausea And Vomiting   Latex Swelling    Ankle Area   Lisinopril Cough   Ampicillin Hives and Nausea And Vomiting    ROS Review of Systems  Constitutional: Negative.   HENT: Negative.    Eyes: Negative.   Respiratory: Negative.    Cardiovascular: Negative.   Gastrointestinal: Negative.   Endocrine: Negative.   Genitourinary: Negative.   Musculoskeletal:  Positive for arthralgias and myalgias.       Left shoulder pain  Skin: Negative.   Allergic/Immunologic: Negative.   Neurological:  Positive for numbness.       Bilateral lower leg numbness and tingling  Hematological: Negative.   Psychiatric/Behavioral: Negative.       Objective:    Physical Exam Constitutional:      Appearance: Normal appearance. She is obese.  HENT:     Head: Normocephalic and atraumatic.     Right Ear: Tympanic membrane, ear canal and external ear normal.     Left Ear: Tympanic membrane, ear canal and external ear normal.     Nose: Nose normal.     Mouth/Throat:     Mouth: Mucous membranes are moist.     Pharynx: Oropharynx is clear.  Eyes:     Extraocular Movements: Extraocular movements intact.     Conjunctiva/sclera: Conjunctivae normal.     Pupils: Pupils are equal, round, and reactive to light.  Cardiovascular:     Rate and Rhythm: Normal rate and regular rhythm.     Pulses: Normal pulses.     Heart sounds: Normal heart sounds.  Pulmonary:     Effort: Pulmonary effort is normal.     Breath sounds: Normal breath sounds.   Abdominal:     General: Abdomen is flat. Bowel sounds are normal.     Palpations: Abdomen is soft.  Musculoskeletal:        General: Tenderness present.     Cervical back: Normal range of motion and neck supple.     Comments: Left shoulder has limited ROM; bilateral lower legs are tender to touch  Skin:    General: Skin is warm and dry.     Capillary Refill: Capillary refill takes less than 2 seconds.  Neurological:     Mental Status: She is alert and oriented to person, place, and time.     Cranial Nerves: No cranial nerve deficit.     Motor: No weakness.     Gait: Gait abnormal.     Comments: Antalgic gait; when palpating for leg swelling her pain response was out of proportion  Psychiatric:        Mood and Affect: Mood normal.        Thought Content: Thought content normal.        Judgment: Judgment normal.     Comments: Screaming in pain to light touch when assessing for leg edema    BP 130/68 (BP Location: Left Arm, Patient Position: Sitting, Cuff Size: Large)   Pulse 80   Temp (!) 96.9 F (36.1 C) (Temporal)   Ht _0  (1.626 m)   Wt 277  lb (125.6 kg)   SpO2 94%   BMI 47.55 kg/m  Wt Readings from Last 3 Encounters:  10/20/20 277 lb (125.6 kg)  08/20/20 280 lb (127 kg)  08/17/20 280 lb (127 kg)     Health Maintenance Due  Topic Date Due   Pneumococcal Vaccine 60-2 Years old (1 - PCV) Never done   Zoster Vaccines- Shingrix (1 of 2) Never done   MAMMOGRAM  Never done    There are no preventive care reminders to display for this patient.  Lab Results  Component Value Date   TSH 0.430 (L) 08/12/2020   Lab Results  Component Value Date   WBC 6.2 08/12/2020   HGB 12.6 08/12/2020   HCT 37.8 08/12/2020   MCV 90 08/12/2020   PLT 283 08/12/2020   Lab Results  Component Value Date   NA 137 08/12/2020   K 4.7 08/12/2020   CO2 26 08/12/2020   GLUCOSE 89 08/12/2020   BUN 20 08/12/2020   CREATININE 0.94 08/12/2020   BILITOT <0.2 08/12/2020   ALKPHOS 78  08/12/2020   AST 17 08/12/2020   ALT 9 08/12/2020   PROT 6.8 08/12/2020   ALBUMIN 4.3 08/12/2020   CALCIUM 8.9 08/12/2020   ANIONGAP 6 12/24/2017   EGFR 70 08/12/2020   Lab Results  Component Value Date   CHOL 162 08/12/2020   Lab Results  Component Value Date   HDL 54 08/12/2020   Lab Results  Component Value Date   LDLCALC 88 08/12/2020   Lab Results  Component Value Date   TRIG 112 08/12/2020   Lab Results  Component Value Date   CHOLHDL 2.5 12/02/2019   Lab Results  Component Value Date   HGBA1C 5.2 08/12/2020      Assessment & Plan:   Problem List Items Addressed This Visit       Cardiovascular and Mediastinum   Hypertension    BP Readings from Last 3 Encounters:  10/20/20 130/68  08/17/20 136/81  06/02/20 (!) 159/72  -well-controlled today       Relevant Orders   CBC with Differential/Platelet   CMP14+EGFR   Lipid Panel With LDL/HDL Ratio     Endocrine   Subclinical hyperthyroidism    -checking thyroid labs today       Relevant Orders   TSH + free T4     Nervous and Auditory   Neuropathy    -to bilateral lower legs -gabapentin isn't helping       Relevant Orders   Ambulatory referral to Neurology     Other   Borderline diabetic    Lab Results  Component Value Date   HGBA1C 5.2 08/12/2020  -not even in the prediabetic range       Fibromyalgia    -having numbness and tingling to bilateral lower legs -she is taking gabapentin now and is requesting more -referral to neuro       Relevant Medications   ibuprofen (ADVIL) 800 MG tablet   Class 3 obesity    Wt Readings from Last 3 Encounters:  10/20/20 277 lb (125.6 kg)  08/20/20 280 lb (127 kg)  08/17/20 280 lb (127 kg)         Chronic left shoulder pain    -no MOI reported -referral to ortho; saw Dr. Ronnie Derby previously; was discharged from Dr. Luna Glasgow d/t no-shows       Relevant Medications   ibuprofen (ADVIL) 800 MG tablet   Other Relevant Orders   Ambulatory  referral to  Orthopedic Surgery   Other Visit Diagnoses     Breast cancer screening by mammogram    -  Primary   Relevant Orders   MM Digital Screening       Meds ordered this encounter  Medications   diclofenac Sodium (VOLTAREN) 1 % GEL    Sig: Apply 2 g topically 4 (four) times daily.    Dispense:  300 g    Refill:  0   ibuprofen (ADVIL) 800 MG tablet    Sig: Take 1 tablet (800 mg total) by mouth every 8 (eight) hours as needed.    Dispense:  30 tablet    Refill:  0    Follow-up: Return in about 4 months (around 02/20/2021) for Lab follow-up (IFG, HLD)- same-day fasting labs.    Annette Larsson, NP

## 2020-10-20 NOTE — Addendum Note (Signed)
Addended by: Laretta Bolster on: 10/20/2020 11:48 AM   Modules accepted: Orders

## 2020-10-20 NOTE — Assessment & Plan Note (Signed)
-  checking thyroid labs today

## 2020-10-20 NOTE — Assessment & Plan Note (Signed)
-  to bilateral lower legs -gabapentin isn't helping

## 2020-10-20 NOTE — Assessment & Plan Note (Addendum)
Lab Results  Component Value Date   HGBA1C 5.2 08/12/2020   -not even in the prediabetic range

## 2020-10-20 NOTE — Assessment & Plan Note (Signed)
-  having numbness and tingling to bilateral lower legs -she is taking gabapentin now and is requesting more -referral to neuro

## 2020-10-20 NOTE — Assessment & Plan Note (Signed)
-  no MOI reported -referral to ortho; saw Dr. Ronnie Derby previously; was discharged from Dr. Luna Glasgow d/t no-shows

## 2020-10-20 NOTE — Assessment & Plan Note (Signed)
Wt Readings from Last 3 Encounters:  10/20/20 277 lb (125.6 kg)  08/20/20 280 lb (127 kg)  08/17/20 280 lb (127 kg)

## 2020-10-20 NOTE — Assessment & Plan Note (Signed)
BP Readings from Last 3 Encounters:  10/20/20 130/68  08/17/20 136/81  06/02/20 (!) 159/72   -well-controlled today

## 2020-10-21 LAB — CBC WITH DIFFERENTIAL/PLATELET
Basophils Absolute: 0 10*3/uL (ref 0.0–0.2)
Basos: 0 %
EOS (ABSOLUTE): 0.1 10*3/uL (ref 0.0–0.4)
Eos: 1 %
Hematocrit: 42.6 % (ref 34.0–46.6)
Hemoglobin: 13.9 g/dL (ref 11.1–15.9)
Immature Grans (Abs): 0 10*3/uL (ref 0.0–0.1)
Immature Granulocytes: 0 %
Lymphocytes Absolute: 1.7 10*3/uL (ref 0.7–3.1)
Lymphs: 30 %
MCH: 29.7 pg (ref 26.6–33.0)
MCHC: 32.6 g/dL (ref 31.5–35.7)
MCV: 91 fL (ref 79–97)
Monocytes Absolute: 0.5 10*3/uL (ref 0.1–0.9)
Monocytes: 8 %
Neutrophils Absolute: 3.2 10*3/uL (ref 1.4–7.0)
Neutrophils: 61 %
Platelets: 307 10*3/uL (ref 150–450)
RBC: 4.68 x10E6/uL (ref 3.77–5.28)
RDW: 13.3 % (ref 11.7–15.4)
WBC: 5.4 10*3/uL (ref 3.4–10.8)

## 2020-10-21 LAB — CMP14+EGFR
ALT: 16 IU/L (ref 0–32)
AST: 24 IU/L (ref 0–40)
Albumin/Globulin Ratio: 2 (ref 1.2–2.2)
Albumin: 4.7 g/dL (ref 3.8–4.9)
Alkaline Phosphatase: 84 IU/L (ref 44–121)
BUN/Creatinine Ratio: 15 (ref 9–23)
BUN: 13 mg/dL (ref 6–24)
Bilirubin Total: 0.3 mg/dL (ref 0.0–1.2)
CO2: 24 mmol/L (ref 20–29)
Calcium: 9.2 mg/dL (ref 8.7–10.2)
Chloride: 102 mmol/L (ref 96–106)
Creatinine, Ser: 0.84 mg/dL (ref 0.57–1.00)
Globulin, Total: 2.4 g/dL (ref 1.5–4.5)
Glucose: 98 mg/dL (ref 65–99)
Potassium: 4.7 mmol/L (ref 3.5–5.2)
Sodium: 140 mmol/L (ref 134–144)
Total Protein: 7.1 g/dL (ref 6.0–8.5)
eGFR: 80 mL/min/{1.73_m2} (ref >59)

## 2020-10-21 LAB — LIPID PANEL WITH LDL/HDL RATIO
Cholesterol, Total: 183 mg/dL (ref 100–199)
HDL: 61 mg/dL (ref >39)
LDL Chol Calc (NIH): 104 mg/dL — ABNORMAL HIGH (ref 0–99)
LDL/HDL Ratio: 1.7 ratio (ref 0.0–3.2)
Triglycerides: 102 mg/dL (ref 0–149)
VLDL Cholesterol Cal: 18 mg/dL (ref 5–40)

## 2020-10-21 LAB — TSH+FREE T4
Free T4: 1.24 ng/dL (ref 0.82–1.77)
TSH: 0.401 u[IU]/mL — ABNORMAL LOW (ref 0.450–4.500)

## 2020-10-21 NOTE — Progress Notes (Signed)
Labs look good.

## 2020-11-03 ENCOUNTER — Other Ambulatory Visit: Payer: Self-pay | Admitting: Family Medicine

## 2020-11-04 ENCOUNTER — Other Ambulatory Visit: Payer: Self-pay | Admitting: Nurse Practitioner

## 2020-11-04 ENCOUNTER — Telehealth: Payer: Self-pay | Admitting: Nurse Practitioner

## 2020-11-04 ENCOUNTER — Other Ambulatory Visit (INDEPENDENT_AMBULATORY_CARE_PROVIDER_SITE_OTHER): Payer: Self-pay

## 2020-11-04 MED ORDER — CETIRIZINE HCL 10 MG PO TABS: 10.0000 mg | ORAL_TABLET | Freq: Every day | ORAL | 11 refills | Status: DC

## 2020-11-04 MED ORDER — FLUTICASONE PROPIONATE 50 MCG/ACT NA SUSP: NASAL | 11 refills | Status: DC

## 2020-11-04 NOTE — Telephone Encounter (Signed)
Rx sent 

## 2020-11-04 NOTE — Telephone Encounter (Signed)
  Patient called and left message she has not received her medicine below and she needs it.   Patient called at 1:03 PM     fluticasone (FLONASE) 50 MCG/ACT nasal spray  cetirizine (ZYRTEC) 10 MG tablet  Pharmacy [facility]

## 2020-11-11 ENCOUNTER — Inpatient Hospital Stay (HOSPITAL_COMMUNITY): Admission: RE | Admit: 2020-11-11 | Payer: Medicare Other | Source: Ambulatory Visit

## 2020-11-13 ENCOUNTER — Other Ambulatory Visit: Payer: Self-pay | Admitting: Nurse Practitioner

## 2020-11-13 ENCOUNTER — Telehealth: Payer: Self-pay | Admitting: Nurse Practitioner

## 2020-11-13 NOTE — Telephone Encounter (Signed)
Patient called needs refill on the following medicine   gabapentin (NEURONTIN) 600 MG tablet  tiZANidine (ZANAFLEX) 4 MG tablet  Pharmacy:  [facility]    Please call her back at [phone]

## 2020-11-13 NOTE — Telephone Encounter (Signed)
Rxs sent

## 2020-12-03 ENCOUNTER — Other Ambulatory Visit: Payer: Self-pay | Admitting: Nurse Practitioner

## 2020-12-09 ENCOUNTER — Other Ambulatory Visit: Payer: Self-pay | Admitting: Nurse Practitioner

## 2020-12-14 ENCOUNTER — Other Ambulatory Visit (HOSPITAL_COMMUNITY): Payer: Self-pay | Admitting: Psychiatry

## 2020-12-14 ENCOUNTER — Other Ambulatory Visit: Payer: Self-pay | Admitting: Nurse Practitioner

## 2020-12-17 ENCOUNTER — Other Ambulatory Visit: Payer: Self-pay

## 2020-12-17 ENCOUNTER — Telehealth (HOSPITAL_COMMUNITY): Payer: Medicare Other | Admitting: Psychiatry

## 2020-12-23 ENCOUNTER — Encounter (HOSPITAL_COMMUNITY): Payer: Self-pay | Admitting: Psychiatry

## 2020-12-23 ENCOUNTER — Other Ambulatory Visit: Payer: Self-pay

## 2020-12-23 ENCOUNTER — Telehealth (INDEPENDENT_AMBULATORY_CARE_PROVIDER_SITE_OTHER): Payer: Medicare Other | Admitting: Psychiatry

## 2020-12-23 DIAGNOSIS — F4310 Post-traumatic stress disorder, unspecified: Secondary | ICD-10-CM | POA: Diagnosis not present

## 2020-12-23 MED ORDER — LAMOTRIGINE 100 MG PO TABS: 100.0000 mg | ORAL_TABLET | Freq: Two times a day (BID) | ORAL | 2 refills | Status: DC

## 2020-12-23 MED ORDER — ESZOPICLONE 3 MG PO TABS: 3.0000 mg | ORAL_TABLET | Freq: Every evening | ORAL | 2 refills | Status: DC | PRN

## 2020-12-23 MED ORDER — ALPRAZOLAM 1 MG PO TABS: ORAL_TABLET | ORAL | 2 refills | Status: DC

## 2020-12-23 MED ORDER — ESCITALOPRAM OXALATE 20 MG PO TABS: 30.0000 mg | ORAL_TABLET | Freq: Every day | ORAL | 2 refills | Status: DC

## 2020-12-23 NOTE — Progress Notes (Signed)
Virtual Visit via Telephone Note  I connected with Annette Galvan on 12/23/20 at  1:40 PM EDT by telephone and verified that I am speaking with the correct person using two identifiers.  Location: Patient: home Provider: home office   I discussed the limitations, risks, security and privacy concerns of performing an evaluation and management service by telephone and the availability of in person appointments. I also discussed with the patient that there may be a patient responsible charge related to this service. The patient expressed understanding and agreed to proceed.     I discussed the assessment and treatment plan with the patient. The patient was provided an opportunity to ask questions and all were answered. The patient agreed with the plan and demonstrated an understanding of the instructions.   The patient was advised to call back or seek an in-person evaluation if the symptoms worsen or if the condition fails to improve as anticipated.  I provided 15 minutes of non-face-to-face time during this encounter.   [name], MD  [facility] MD/PA/NP OP Progress Note  12/23/2020 1:57 PM Annette Galvan  MRN:  [id number]  Chief Complaint:  Chief Complaint   Anxiety; Depression; Follow-up    HPI: This patient is a 60 year old widowed white female who lives alone in [place].  She has 2 grown daughters and 3 grandchildren.  She is on disability.  The patient returns for follow-up after 3 months regarding her depression and anxiety.  She states that she has been much more upset and anxious recently.  The house she is renting is now being put up for sale.  She really does not have anywhere to go.  Her children either live too far away and her siblings are too difficult to get along with.  She is applied for various apartments but has not had any answers yet.  The patient states she is more anxious and not able to sleep even though she takes Xanax and trazodone.  She asked if she can go back  to [place] and I think this is a reasonable request.  She denies any thoughts of self-harm or suicidal ideation but primarily just seems worried. Visit Diagnosis:    ICD-10-CM   1. PTSD (post-traumatic stress disorder)  F43.10       Past Psychiatric History: History of depression anxiety and a remote history of substance abuse.  Admissions in her 23s or 20s  Past Medical History:  Past Medical History:  Diagnosis Date   Arthritis    needs 2 knee replacements; also has left shoulder and left hip pain   Back pain, chronic    Bilateral chronic knee pain    Bipolar 1 disorder (HCC)    Bulging disc    Fibromyalgia    GERD (gastroesophageal reflux disease)    HSV-2 (herpes simplex virus 2) infection 2013   Hx of trichomoniasis    Itching 06/26/2013   Leg pain    Trichimoniasis 06/26/2013   UTI (lower urinary tract infection) 10/12/2012   Vaginal discharge 06/26/2013   Vaginal irritation 12/05/2013   Vaginal odor 12/28/2012   Had discharge +clue   Yeast infection 02/04/2013    Past Surgical History:  Procedure Laterality Date   ABDOMINAL HYSTERECTOMY     total   BREAST ENHANCEMENT SURGERY Right    COLONOSCOPY WITH PROPOFOL N/A 12/07/2015   Procedure: COLONOSCOPY WITH PROPOFOL;  Surgeon: Daneil Dolin, MD;  [facility];  Service: Endoscopy;  Laterality: N/A;  915   TUBAL  LIGATION      Family Psychiatric History: see below  Family History:  Family History  Problem Relation Age of Onset   Hypertension Mother    Diabetes Mother    Depression Mother    Hyperlipidemia Mother    Fibromyalgia Mother    Arthritis Mother    Cancer Father        bone   Hyperlipidemia Father    Hypertension Father    Bipolar disorder Father    Depression Sister    Hyperlipidemia Sister    Fibromyalgia Sister    Fibromyalgia Sister    Bipolar disorder Other    Drug abuse Other    Alcohol abuse Other    Hypertension Brother    Other Brother        shingles; back problems    Diabetes Paternal Grandmother    Alzheimer's disease Maternal Grandmother    Obesity Daughter    Other Daughter        back problems   Bipolar disorder Daughter    Depression Daughter    Other Daughter        on pain meds   Colon cancer Neg Hx     Social History:  Social History   Socioeconomic History   Marital status: Widowed    Spouse name: Not on file   Number of children: Not on file   Years of education: Not on file   Highest education level: Not on file  Occupational History   Occupation: Disabled  Tobacco Use   Smoking status: Light Smoker    Packs/day: 0.25    Years: 25.00    Pack years: 6.25    Types: Cigarettes   Smokeless tobacco: Never   Tobacco comments:    2 cigarettes daily  Vaping Use   Vaping Use: Never used  Substance and Sexual Activity   Alcohol use: Yes    Alcohol/week: 0.0 standard drinks    Comment: occasional   Drug use: No    Comment: cocaine, clean for 4.5 years as of 12/04/2015   Sexual activity: Yes    Birth control/protection: Surgical    Comment: hysterectomy  Other Topics Concern   Not on file  Social History Narrative   Not on file   Social Determinants of Health   Financial Resource Strain: Low Risk    Difficulty of Paying Living Expenses: Not hard at all  Food Insecurity: No Food Insecurity   Worried About Charity fundraiser in the Last Year: Never true   [place] in the Last Year: Never true  Transportation Needs: No Transportation Needs   Lack of Transportation (Medical): No   Lack of Transportation (Non-Medical): No  Physical Activity: Inactive   Days of Exercise per Week: 0 days   Minutes of Exercise per Session: 0 min  Stress: No Stress Concern Present   Feeling of Stress : Not at all  Social Connections: Socially Isolated   Frequency of Communication with Friends and Family: More than three times a week   Frequency of Social Gatherings with Friends and Family: Once a week   Attends Religious Services:  Never   Marine scientist or Organizations: No   Attends Archivist Meetings: Never   Marital Status: Widowed    Allergies:  Allergies  Allergen Reactions   Amoxicillin Hives   Hydrocodone Nausea And Vomiting   Latex Swelling    Ankle Area   Lisinopril Cough   Ampicillin Hives and Nausea And  Vomiting    Metabolic Disorder Labs: Lab Results  Component Value Date   HGBA1C 5.2 08/12/2020   MPG 117 05/18/2020   MPG 108 12/02/2019   No results found for: PROLACTIN Lab Results  Component Value Date   CHOL 183 10/20/2020   TRIG 102 10/20/2020   HDL 61 10/20/2020   CHOLHDL 2.5 12/02/2019   VLDL 23 08/12/2016   LDLCALC 104 (H) 10/20/2020   LDLCALC 88 08/12/2020   Lab Results  Component Value Date   TSH 0.401 (L) 10/20/2020   TSH 0.430 (L) 08/12/2020    Therapeutic Level Labs: No results found for: LITHIUM No results found for: VALPROATE No components found for:  CBMZ  Current Medications: Current Outpatient Medications  Medication Sig Dispense Refill   eszopiclone 3 MG TABS Take 1 tablet (3 mg total) by mouth at bedtime as needed. Take immediately before bedtime 30 tablet 2   ALPRAZolam (XANAX) 1 MG tablet TAKE (1) TABLET BY MOUTH (4) TIMES DAILY AS NEEDED FOR ANXIETY. 120 tablet 2   cetirizine (ZYRTEC) 10 MG tablet Take 1 tablet (10 mg total) by mouth daily. 30 tablet 11   diclofenac Sodium (VOLTAREN) 1 % GEL Apply 2 g topically 4 (four) times daily. 300 g 0   escitalopram (LEXAPRO) 20 MG tablet Take 1.5 tablets (30 mg total) by mouth daily. 45 tablet 2   fluticasone (FLONASE) 50 MCG/ACT nasal spray INSTILL 2 SPRAYS IN EACH NOSTRIL TWICE DAILY AS DIRECTED. 16 g 11   gabapentin (NEURONTIN) 600 MG tablet TAKE 1 TABLET BY MOUTH THREE TIMES DAILY. 90 tablet 0   ibuprofen (ADVIL) 800 MG tablet TAKE 1 TABLET BY MOUTH EVERY 8 HOURS AS NEEDED. 30 tablet 0   lamoTRIgine (LAMICTAL) 100 MG tablet Take 1 tablet (100 mg total) by mouth 2 (two) times daily. 60 tablet  2   losartan (COZAAR) 50 MG tablet Take 1 tablet (50 mg total) by mouth daily. 90 tablet 1   nystatin (MYCOSTATIN/NYSTOP) powder APPLY TO THE AFFECTED AREAS FOUR TIMES DAILY AS DIRECTED. 60 g 0   omeprazole (PRILOSEC) 40 MG capsule TAKE (1) CAPSULE BY MOUTH ONCE DAILY FOR ACID REFLUX. 90 capsule 1   ondansetron (ZOFRAN-ODT) 4 MG disintegrating tablet Take 1 tablet (4 mg total) by mouth every 8 (eight) hours as needed for nausea or vomiting. 20 tablet 0   tiZANidine (ZANAFLEX) 4 MG tablet TAKE 1 TABLET BY MOUTH ONCE EVERY 8 HOURS AS NEEDED FOR MUSCLE SPASMS. 90 tablet 0   traMADol (ULTRAM) 50 MG tablet TAKE 1 TABLET BY MOUTH EVERY 12 HOURS AS NEEDED. 60 tablet 0   traZODone (DESYREL) 150 MG tablet Take 2 tablets (300 mg total) by mouth at bedtime. 60 tablet 3   No current facility-administered medications for this visit.     Musculoskeletal: Strength & Muscle Tone: na Gait & Station: na Patient leans: N/A  Psychiatric Specialty Exam: Review of Systems  Psychiatric/Behavioral:  Positive for sleep disturbance. The patient is nervous/anxious.   All other systems reviewed and are negative.  There were no vitals taken for this visit.There is no height or weight on file to calculate BMI.  General Appearance: NA  Eye Contact:  NA  Speech:  Clear and Coherent  Volume:  Normal  Mood:  Anxious  Affect:  NA  Thought Process:  Goal Directed  Orientation:  Full (Time, Place, and Person)  Thought Content: Rumination   Suicidal Thoughts:  No  Homicidal Thoughts:  No  Memory:  Immediate;  Good Recent;   Good Remote;   Good  Judgement:  Good  Insight:  Fair  Psychomotor Activity:  Normal  Concentration:  Concentration: Good and Attention Span: Good  Recall:  Good  Fund of Knowledge: Good  Language: Good  Akathisia:  No  Handed:  Right  AIMS (if indicated): not done  Assets:  Communication Skills Desire for Improvement Resilience Social Support Talents/Skills  ADL's:  Intact   Cognition: WNL  Sleep:  Poor   Screenings: PHQ2-9    Flowsheet Row Video Visit from 12/23/2020 in [place] Office Visit from 10/20/2020 in [facility] Video Visit from 09/17/2020 in [place] from 08/20/2020 in [facility] Office Visit from 08/17/2020 in [facility]  PHQ-2 Total Score '3 1 1 '$ 0 1  PHQ-9 Total Score 7 -- -- -- --      Flowsheet Row Video Visit from 12/23/2020 in [facility] Video Visit from 09/17/2020 in [place] No Risk No Risk        Assessment and Plan: This patient is a 60 year old female with a history of posttraumatic stress disorder and anxiety.  She is more stressed right now because of her housing situation is having difficulty sleeping.  She will discontinue trazodone in favor of Lunesta 3 mg at bedtime for sleep.  For now she will continue Lexapro 30 mg daily for depression, Xanax 1 mg 4 times daily for anxiety and on Lamictal 100 mg twice daily for mood stabilization.  She will return to see me in 3 months   [name], MD 12/23/2020, 1:57 PM

## 2020-12-28 ENCOUNTER — Other Ambulatory Visit: Payer: Self-pay | Admitting: Nurse Practitioner

## 2020-12-31 ENCOUNTER — Telehealth: Payer: Self-pay | Admitting: Nurse Practitioner

## 2020-12-31 ENCOUNTER — Other Ambulatory Visit: Payer: Self-pay

## 2020-12-31 DIAGNOSIS — M216X1 Other acquired deformities of right foot: Secondary | ICD-10-CM

## 2020-12-31 DIAGNOSIS — M797 Fibromyalgia: Secondary | ICD-10-CM

## 2020-12-31 DIAGNOSIS — M5136 Other intervertebral disc degeneration, lumbar region: Secondary | ICD-10-CM

## 2020-12-31 DIAGNOSIS — M170 Bilateral primary osteoarthritis of knee: Secondary | ICD-10-CM

## 2020-12-31 MED ORDER — UNABLE TO FIND: 0 refills | Status: DC

## 2020-12-31 NOTE — Telephone Encounter (Signed)
Rx printed off and up front for pt.

## 2020-12-31 NOTE — Telephone Encounter (Signed)
Please call the pt , there lift chair is stuck, they need a new script

## 2021-01-05 ENCOUNTER — Other Ambulatory Visit: Payer: Self-pay | Admitting: Nurse Practitioner

## 2021-01-13 ENCOUNTER — Other Ambulatory Visit: Payer: Self-pay | Admitting: Nurse Practitioner

## 2021-01-13 DIAGNOSIS — I10 Essential (primary) hypertension: Secondary | ICD-10-CM

## 2021-01-14 ENCOUNTER — Other Ambulatory Visit: Payer: Self-pay | Admitting: Nurse Practitioner

## 2021-01-19 ENCOUNTER — Other Ambulatory Visit: Payer: Self-pay | Admitting: Nurse Practitioner

## 2021-01-21 ENCOUNTER — Telehealth: Payer: Self-pay | Admitting: Nurse Practitioner

## 2021-01-21 NOTE — Telephone Encounter (Signed)
Pt called in to see if she could get a referral to Dr. Aline Brochure. Isn't please with Dr. Luna Glasgow.

## 2021-01-26 ENCOUNTER — Telehealth: Payer: Self-pay

## 2021-01-26 ENCOUNTER — Telehealth: Payer: Self-pay | Admitting: Orthopedic Surgery

## 2021-01-26 ENCOUNTER — Other Ambulatory Visit: Payer: Self-pay | Admitting: Nurse Practitioner

## 2021-01-26 NOTE — Telephone Encounter (Signed)
error 

## 2021-01-26 NOTE — Telephone Encounter (Signed)
Patient requests appointment - 2nd opinion evaluation, for bilateral knees, osteoarthritis; had been referred by Demetrius Revel, primary care provider - aware we can re-open referral if records, imaging reports, and CD are received, then approved for appointment. Patient said she will contact Dr Richardson Landry Lucey's office to request, and will let us know when has picked up.

## 2021-02-01 DIAGNOSIS — R7303 Prediabetes: Secondary | ICD-10-CM | POA: Diagnosis not present

## 2021-02-01 DIAGNOSIS — M170 Bilateral primary osteoarthritis of knee: Secondary | ICD-10-CM | POA: Diagnosis not present

## 2021-02-03 ENCOUNTER — Other Ambulatory Visit: Payer: Self-pay | Admitting: Nurse Practitioner

## 2021-02-10 ENCOUNTER — Telehealth: Payer: Self-pay | Admitting: Nurse Practitioner

## 2021-02-10 ENCOUNTER — Other Ambulatory Visit: Payer: Self-pay

## 2021-02-10 NOTE — Telephone Encounter (Signed)
Pt called in about prescription refills.  Pt states that [facility] has faxed over paperwork for refills on Tramadol and Gabapentin . Pt need meds refilled

## 2021-02-11 ENCOUNTER — Other Ambulatory Visit: Payer: Self-pay | Admitting: Family Medicine

## 2021-02-11 MED ORDER — TRAMADOL HCL 50 MG PO TABS: 50.0000 mg | ORAL_TABLET | Freq: Two times a day (BID) | ORAL | 0 refills | Status: AC

## 2021-02-11 MED ORDER — GABAPENTIN 600 MG PO TABS: 600.0000 mg | ORAL_TABLET | Freq: Three times a day (TID) | ORAL | 0 refills | Status: DC

## 2021-02-11 NOTE — Telephone Encounter (Signed)
Tramadol and Gabapentin

## 2021-02-11 NOTE — Telephone Encounter (Signed)
Called pt let her know one month refill done and to keep appt with gray 03/17/2021 @3 :20

## 2021-02-11 NOTE — Progress Notes (Signed)
Gabapentin 600

## 2021-02-19 ENCOUNTER — Ambulatory Visit: Payer: Medicare Other | Admitting: Nurse Practitioner

## 2021-02-20 ENCOUNTER — Other Ambulatory Visit: Payer: Self-pay | Admitting: Nurse Practitioner

## 2021-03-05 ENCOUNTER — Other Ambulatory Visit: Payer: Self-pay | Admitting: Family Medicine

## 2021-03-15 ENCOUNTER — Other Ambulatory Visit: Payer: Self-pay | Admitting: *Deleted

## 2021-03-15 ENCOUNTER — Telehealth: Payer: Self-pay | Admitting: Nurse Practitioner

## 2021-03-15 MED ORDER — GABAPENTIN 600 MG PO TABS
600.0000 mg | ORAL_TABLET | Freq: Three times a day (TID) | ORAL | 0 refills | Status: DC
Start: 2021-03-15 — End: 2021-04-26

## 2021-03-15 NOTE — Telephone Encounter (Signed)
Pt called in for refill on   gabapentin (NEURONTIN) 600 MG tablet   [facility]

## 2021-03-15 NOTE — Telephone Encounter (Signed)
Rx was sent into pharmacy.  

## 2021-03-16 ENCOUNTER — Emergency Department (HOSPITAL_COMMUNITY): Payer: Medicare Other

## 2021-03-16 ENCOUNTER — Emergency Department (HOSPITAL_COMMUNITY)
Admission: EM | Admit: 2021-03-16 | Discharge: 2021-03-16 | Disposition: A | Discharge status: Home / Self Care | Payer: Medicare Other | Attending: Emergency Medicine | Admitting: Emergency Medicine

## 2021-03-16 ENCOUNTER — Encounter (HOSPITAL_COMMUNITY): Payer: Self-pay | Admitting: *Deleted

## 2021-03-16 DIAGNOSIS — Z79899 Other long term (current) drug therapy: Secondary | ICD-10-CM | POA: Insufficient documentation

## 2021-03-16 DIAGNOSIS — S8002XA Contusion of left knee, initial encounter: Secondary | ICD-10-CM | POA: Diagnosis not present

## 2021-03-16 DIAGNOSIS — S8992XA Unspecified injury of left lower leg, initial encounter: Secondary | ICD-10-CM | POA: Diagnosis present

## 2021-03-16 DIAGNOSIS — W19XXXA Unspecified fall, initial encounter: Secondary | ICD-10-CM | POA: Diagnosis not present

## 2021-03-16 DIAGNOSIS — Z91040 Latex allergy status: Secondary | ICD-10-CM | POA: Insufficient documentation

## 2021-03-16 DIAGNOSIS — Y92481 Parking lot as the place of occurrence of the external cause: Secondary | ICD-10-CM | POA: Diagnosis not present

## 2021-03-16 DIAGNOSIS — I10 Essential (primary) hypertension: Secondary | ICD-10-CM | POA: Insufficient documentation

## 2021-03-16 DIAGNOSIS — F17210 Nicotine dependence, cigarettes, uncomplicated: Secondary | ICD-10-CM | POA: Insufficient documentation

## 2021-03-16 DIAGNOSIS — M25562 Pain in left knee: Secondary | ICD-10-CM | POA: Diagnosis not present

## 2021-03-16 MED ORDER — OXYCODONE-ACETAMINOPHEN 5-325 MG PO TABS: 1.0000 | ORAL_TABLET | ORAL | 0 refills | Status: DC | PRN

## 2021-03-16 NOTE — ED Notes (Signed)
Pt ambulate with caregiver to the bathroom.

## 2021-03-16 NOTE — Discharge Instructions (Signed)
See your Physician for recheck as scheduled

## 2021-03-16 NOTE — ED Notes (Signed)
Pt verbalized she has been using a walker to get around house with help. Pt needs knee surgery to bilateral knees but pt needs to decrease weight first. Pt states pain has increased since fall on 03/08/2021. Pt fell onto her left side. Her caretaker was with her.

## 2021-03-16 NOTE — ED Notes (Signed)
Pt requested something to drink. Lemon lime drink given.

## 2021-03-16 NOTE — ED Triage Notes (Signed)
Fell 03-08-21.  Pain in left knee, left hip and left arm and left breast area

## 2021-03-16 NOTE — ED Provider Notes (Signed)
[facility] EMERGENCY DEPARTMENT Provider Note   CSN: [id number] Arrival date & time: 03/16/21  1241     History Chief Complaint  Patient presents with   Annette Galvan    ITSEL OPFER is a 60 y.o. female.  Pt complains of pain in her left knee.  Pt fell on 11/21 in a parking lot on concrete parking block   The history is provided by the patient. No language interpreter was used.  Fall This is a new problem. The current episode started more than 1 week ago. The problem occurs constantly. The problem has been gradually worsening. Nothing aggravates the symptoms. Nothing relieves the symptoms. She has tried nothing for the symptoms.      Past Medical History:  Diagnosis Date   Arthritis    needs 2 knee replacements; also has left shoulder and left hip pain   Back pain, chronic    Bilateral chronic knee pain    Bipolar 1 disorder (HCC)    Bulging disc    Fibromyalgia    GERD (gastroesophageal reflux disease)    HSV-2 (herpes simplex virus 2) infection 2013   Hx of trichomoniasis    Itching 06/26/2013   Leg pain    Trichimoniasis 06/26/2013   UTI (lower urinary tract infection) 10/12/2012   Vaginal discharge 06/26/2013   Vaginal irritation 12/05/2013   Vaginal odor 12/28/2012   Had discharge +clue   Yeast infection 02/04/2013    Patient Active Problem List   Diagnosis Date Noted   Chronic left shoulder pain 10/20/2020   Neuropathy 10/20/2020   Class 3 obesity ([place]) 12/02/2019   Candidal intertrigo 01/09/2017   Polypharmacy 11/13/2015   Subclinical hyperthyroidism 08/14/2015   GERD (gastroesophageal reflux disease) 11/12/2014   Breast asymmetry between native breast and reconstructed breast 05/26/2014   Inversion deformity of right foot 05/26/2014   OA (osteoarthritis) of knee 05/17/2013   Encounter to establish care 12/26/2012   Sleep apnea 09/13/2012   Tobacco use 09/13/2012   Hypertension 02/10/2012   Fibromyalgia 10/11/2011   Morbid obesity ([place]) 08/29/2011    Borderline diabetic 08/29/2011   Bipolar 1 disorder ([place]) 08/29/2011   Difficulty in walking(719.7) 05/10/2011   DDD (degenerative disc disease), lumbar 04/04/2007    Past Surgical History:  Procedure Laterality Date   ABDOMINAL HYSTERECTOMY     total   BREAST ENHANCEMENT SURGERY Right    COLONOSCOPY WITH PROPOFOL N/A 12/07/2015   Procedure: COLONOSCOPY WITH PROPOFOL;  Surgeon: Daneil Dolin, MD;  [facility];  Service: Endoscopy;  Laterality: N/A;  915   TUBAL LIGATION       OB History     Gravida  2   Para  2   Term      Preterm      AB      Living  2      SAB      IAB      Ectopic      Multiple      Live Births  2           Family History  Problem Relation Age of Onset   Hypertension Mother    Diabetes Mother    Depression Mother    Hyperlipidemia Mother    Fibromyalgia Mother    Arthritis Mother    Cancer Father        bone   Hyperlipidemia Father    Hypertension Father    Bipolar disorder Father    Depression Sister  Hyperlipidemia Sister    Fibromyalgia Sister    Fibromyalgia Sister    Bipolar disorder Other    Drug abuse Other    Alcohol abuse Other    Hypertension Brother    Other Brother        shingles; back problems   Diabetes Paternal Grandmother    Alzheimer's disease Maternal Grandmother    Obesity Daughter    Other Daughter        back problems   Bipolar disorder Daughter    Depression Daughter    Other Daughter        on pain meds   Colon cancer Neg Hx     Social History   Tobacco Use   Smoking status: Light Smoker    Packs/day: 0.25    Years: 25.00    Pack years: 6.25    Types: Cigarettes   Smokeless tobacco: Never   Tobacco comments:    2 cigarettes daily  Vaping Use   Vaping Use: Never used  Substance Use Topics   Alcohol use: Yes    Alcohol/week: 0.0 standard drinks    Comment: occasional   Drug use: No    Comment: cocaine, clean for 4.5 years as of 12/04/2015    Home  Medications Prior to Admission medications   Medication Sig Start Date End Date Taking? Authorizing Provider  ALPRAZolam (XANAX) 1 MG tablet TAKE (1) TABLET BY MOUTH (4) TIMES DAILY AS NEEDED FOR ANXIETY. 12/23/20   Cloria Spring, MD  cetirizine (ZYRTEC) 10 MG tablet Take 1 tablet (10 mg total) by mouth daily. 11/04/20   Noreene Larsson, NP  diclofenac Sodium (VOLTAREN) 1 % GEL APPLY 2 GRAMS TO THE AFFECTED AREAS FOUR TIMES A DAY. 01/19/21   Noreene Larsson, NP  escitalopram (LEXAPRO) 20 MG tablet Take 1.5 tablets (30 mg total) by mouth daily. 12/23/20   Cloria Spring, MD  eszopiclone 3 MG TABS Take 1 tablet (3 mg total) by mouth at bedtime as needed. Take immediately before bedtime 12/23/20 12/23/21  Cloria Spring, MD  fluticasone [facility]) 50 MCG/ACT nasal spray INSTILL 2 SPRAYS IN EACH NOSTRIL TWICE DAILY AS DIRECTED. 11/04/20   Noreene Larsson, NP  gabapentin (NEURONTIN) 600 MG tablet Take 1 tablet (600 mg total) by mouth 3 (three) times daily. 03/15/21   Noreene Larsson, NP  ibuprofen (ADVIL) 800 MG tablet TAKE 1 TABLET BY MOUTH EVERY 8 HOURS AS NEEDED. 03/05/21   Fayrene Helper, MD  lamoTRIgine (LAMICTAL) 100 MG tablet Take 1 tablet (100 mg total) by mouth 2 (two) times daily. 12/23/20   Cloria Spring, MD  losartan (COZAAR) 50 MG tablet TAKE ONE TABLET BY MOUTH ONCE DAILY. 01/13/21   Noreene Larsson, NP  nystatin (MYCOSTATIN/NYSTOP) powder APPLY TO THE AFFECTED AREAS FOUR TIMES DAILY AS DIRECTED. 07/06/20   Alycia Rossetti, MD  omeprazole (PRILOSEC) 40 MG capsule TAKE (1) CAPSULE BY MOUTH ONCE DAILY FOR ACID REFLUX. 05/18/20   Alycia Rossetti, MD  ondansetron (ZOFRAN-ODT) 4 MG disintegrating tablet Take 1 tablet (4 mg total) by mouth every 8 (eight) hours as needed for nausea or vomiting. 07/02/20   Noreene Larsson, NP  tiZANidine (ZANAFLEX) 4 MG tablet TAKE 1 TABLET BY MOUTH ONCE EVERY 8 HOURS AS NEEDED FOR MUSCLE SPASMS. 02/22/21   Fayrene Helper, MD  traMADol (ULTRAM) 50 MG tablet TAKE 1  TABLET BY MOUTH EVERY 12 HOURS AS NEEDED. 01/05/21   Noreene Larsson, NP  traZODone (  DESYREL) 150 MG tablet Take 2 tablets (300 mg total) by mouth at bedtime. 09/17/20   Cloria Spring, MD  UNABLE TO FIND Med Name: Hand remote to lift chair. 12/31/20   Noreene Larsson, NP    Allergies    Amoxicillin, Hydrocodone, Latex, Lisinopril, and Ampicillin  Review of Systems   Review of Systems  All other systems reviewed and are negative.  Physical Exam Updated Vital Signs BP (!) 141/76   Pulse 72   Temp 98.5 F (36.9 C) (Oral)   Resp 17   Ht 5\' 3"  (1.6 m)   Wt 131.1 kg   SpO2 97%   BMI 51.19 kg/m   Physical Exam Vitals and nursing note reviewed.  Constitutional:      Appearance: She is well-developed.  HENT:     Head: Normocephalic.     Mouth/Throat:     Mouth: Mucous membranes are moist.  Cardiovascular:     Rate and Rhythm: Normal rate.  Pulmonary:     Effort: Pulmonary effort is normal.  Abdominal:     General: There is no distension.  Musculoskeletal:        General: Swelling and tenderness present. Normal range of motion.  Skin:    General: Skin is warm.  Neurological:     General: No focal deficit present.     Mental Status: She is alert and oriented to person, place, and time.  Psychiatric:        Mood and Affect: Mood normal.    ED Results / Procedures / Treatments   Labs (all labs ordered are listed, but only abnormal results are displayed) Labs Reviewed - No data to display  EKG None  Radiology DG Knee Complete 4 Views Left  Result Date: 03/16/2021 CLINICAL DATA:  Fall, knee pain EXAM: LEFT KNEE - COMPLETE 4+ VIEW COMPARISON:  Knee radiographs 09/26/2013 FINDINGS: There is no acute fracture or dislocation. There is severe medial and mild lateral tibiofemoral joint space narrowing with associated subchondral sclerosis and osteophytosis, progressed since 2015. There is no effusion. The soft tissues are unremarkable. IMPRESSION: 1. No acute fracture or  dislocation. 2. Severe medial compartment joint space narrowing with associated subchondral sclerosis and osteophytosis, progressed since 2015. Electronically Signed   By: Valetta Mole M.D.   On: 03/16/2021 14:07    Procedures Procedures   Medications Ordered in ED Medications - No data to display  ED Course  I have reviewed the triage vital signs and the nursing notes.  Pertinent labs & imaging results that were available during my care of the patient were reviewed by me and considered in my medical decision making (see chart for details).    MDM Rules/Calculators/A&P                           MDM:  Odette Horns ordered, reviewed and interpreted,  Final Clinical Impression(s) / ED Diagnoses Final diagnoses:  Fall, initial encounter  Contusion of left knee, initial encounter    Rx / DC Orders ED Discharge Orders          Ordered    oxyCODONE-acetaminophen (PERCOCET) 5-325 MG tablet  Every 4 hours PRN        03/16/21 1436          An After Visit Summary was printed and given to the patient.    Fransico Meadow, [place] 03/16/21 1436    Milton [place], MD 03/17/21 [phone]

## 2021-03-17 ENCOUNTER — Ambulatory Visit: Payer: Medicare Other | Admitting: Nurse Practitioner

## 2021-03-22 ENCOUNTER — Other Ambulatory Visit: Payer: Self-pay | Admitting: Family Medicine

## 2021-03-24 ENCOUNTER — Other Ambulatory Visit: Payer: Self-pay | Admitting: Family Medicine

## 2021-03-24 NOTE — Telephone Encounter (Signed)
Is this for knee pain or something else?

## 2021-03-25 ENCOUNTER — Telehealth: Payer: Self-pay

## 2021-03-25 ENCOUNTER — Other Ambulatory Visit: Payer: Self-pay | Admitting: Nurse Practitioner

## 2021-03-25 DIAGNOSIS — G8929 Other chronic pain: Secondary | ICD-10-CM

## 2021-03-25 DIAGNOSIS — M170 Bilateral primary osteoarthritis of knee: Secondary | ICD-10-CM

## 2021-03-25 MED ORDER — TRAMADOL HCL 50 MG PO TABS: 50.0000 mg | ORAL_TABLET | Freq: Two times a day (BID) | ORAL | 0 refills | Status: AC | PRN

## 2021-03-25 NOTE — Telephone Encounter (Signed)
Pt called requesting Tramadol to be called into [place]. Please follow up to see if this can be called in.

## 2021-03-25 NOTE — Telephone Encounter (Signed)
sent 

## 2021-03-26 ENCOUNTER — Other Ambulatory Visit: Payer: Self-pay

## 2021-03-26 ENCOUNTER — Telehealth (INDEPENDENT_AMBULATORY_CARE_PROVIDER_SITE_OTHER): Payer: Medicare Other | Admitting: Psychiatry

## 2021-03-26 ENCOUNTER — Encounter (HOSPITAL_COMMUNITY): Payer: Self-pay | Admitting: Psychiatry

## 2021-03-26 DIAGNOSIS — F4310 Post-traumatic stress disorder, unspecified: Secondary | ICD-10-CM | POA: Diagnosis not present

## 2021-03-26 MED ORDER — ESZOPICLONE 3 MG PO TABS: 3.0000 mg | ORAL_TABLET | Freq: Every evening | ORAL | 2 refills | Status: DC | PRN

## 2021-03-26 MED ORDER — ESCITALOPRAM OXALATE 20 MG PO TABS
30.0000 mg | ORAL_TABLET | Freq: Every day | ORAL | 2 refills | Status: DC
Start: 2021-03-26 — End: 2021-04-26

## 2021-03-26 MED ORDER — ALPRAZOLAM 1 MG PO TABS: ORAL_TABLET | ORAL | 2 refills | Status: DC

## 2021-03-26 MED ORDER — LAMOTRIGINE 100 MG PO TABS: 100.0000 mg | ORAL_TABLET | Freq: Two times a day (BID) | ORAL | 2 refills | Status: DC

## 2021-03-26 NOTE — Progress Notes (Signed)
Virtual Visit via Telephone Note  I connected with Annette Galvan on 03/26/21 at  9:00 AM EST by telephone and verified that I am speaking with the correct person using two identifiers.  Location: Patient: home Provider: office   I discussed the limitations, risks, security and privacy concerns of performing an evaluation and management service by telephone and the availability of in person appointments. I also discussed with the patient that there may be a patient responsible charge related to this service. The patient expressed understanding and agreed to proceed    I discussed the assessment and treatment plan with the patient. The patient was provided an opportunity to ask questions and all were answered. The patient agreed with the plan and demonstrated an understanding of the instructions.   The patient was advised to call back or seek an in-person evaluation if the symptoms worsen or if the condition fails to improve as anticipated.  I provided 13 minutes of non-face-to-face time during this encounter.   [name], MD  [facility] MD/PA/NP OP Progress Note  03/26/2021 9:20 AM Annette Galvan  MRN:  [id number]  Chief Complaint: depression, anxiety HPI: This patient is a 60 year old widowed white female who lives alone in [place].  She has 2 grown daughters and 3 grandchildren.  She is on disability.  The patient returns for follow-up after 3 months regarding her depression and anxiety.  She states overall she is doing fairly well.  She is still wanting to have her knee replacement but she has gained too much weight and is up to almost 300 pounds.  She states that she gets frustrated with her situation and starts eating too much.  I urged her to talk to primary care about phentermine or something else to help with weight loss.  Overall her mood has been stable and she denies significant depression anxiety difficulty sleeping or mood swings.  She denies suicidal ideation Visit Diagnosis:     ICD-10-CM   1. PTSD (post-traumatic stress disorder)  F43.10       Past Psychiatric History: History of depression anxiety and a remote history of substance abuse.  Admissions in her 22s and 71s  Past Medical History:  Past Medical History:  Diagnosis Date   Arthritis    needs 2 knee replacements; also has left shoulder and left hip pain   Back pain, chronic    Bilateral chronic knee pain    Bipolar 1 disorder (HCC)    Bulging disc    Fibromyalgia    GERD (gastroesophageal reflux disease)    HSV-2 (herpes simplex virus 2) infection 2013   Hx of trichomoniasis    Itching 06/26/2013   Leg pain    Trichimoniasis 06/26/2013   UTI (lower urinary tract infection) 10/12/2012   Vaginal discharge 06/26/2013   Vaginal irritation 12/05/2013   Vaginal odor 12/28/2012   Had discharge +clue   Yeast infection 02/04/2013    Past Surgical History:  Procedure Laterality Date   ABDOMINAL HYSTERECTOMY     total   BREAST ENHANCEMENT SURGERY Right    COLONOSCOPY WITH PROPOFOL N/A 12/07/2015   Procedure: COLONOSCOPY WITH PROPOFOL;  Surgeon: Daneil Dolin, MD;  Location: [facility];  Service: Endoscopy;  Laterality: N/A;  11   TUBAL LIGATION      Family Psychiatric History: see below  Family History:  Family History  Problem Relation Age of Onset   Hypertension Mother    Diabetes Mother    Depression Mother    Hyperlipidemia  Mother    Fibromyalgia Mother    Arthritis Mother    Cancer Father        bone   Hyperlipidemia Father    Hypertension Father    Bipolar disorder Father    Depression Sister    Hyperlipidemia Sister    Fibromyalgia Sister    Fibromyalgia Sister    Bipolar disorder Other    Drug abuse Other    Alcohol abuse Other    Hypertension Brother    Other Brother        shingles; back problems   Diabetes Paternal Grandmother    Alzheimer's disease Maternal Grandmother    Obesity Daughter    Other Daughter        back problems   Bipolar disorder Daughter     Depression Daughter    Other Daughter        on pain meds   Colon cancer Neg Hx     Social History:  Social History   Socioeconomic History   Marital status: Widowed    Spouse name: Not on file   Number of children: Not on file   Years of education: Not on file   Highest education level: Not on file  Occupational History   Occupation: Disabled  Tobacco Use   Smoking status: Light Smoker    Packs/day: 0.25    Years: 25.00    Pack years: 6.25    Types: Cigarettes   Smokeless tobacco: Never   Tobacco comments:    2 cigarettes daily  Vaping Use   Vaping Use: Never used  Substance and Sexual Activity   Alcohol use: Yes    Alcohol/week: 0.0 standard drinks    Comment: occasional   Drug use: No    Comment: cocaine, clean for 4.5 years as of 12/04/2015   Sexual activity: Yes    Birth control/protection: Surgical    Comment: hysterectomy  Other Topics Concern   Not on file  Social History Narrative   Not on file   Social Determinants of Health   Financial Resource Strain: Low Risk    Difficulty of Paying Living Expenses: Not hard at all  Food Insecurity: No Food Insecurity   Worried About Charity fundraiser in the Last Year: Never true   [place] in the Last Year: Never true  Transportation Needs: No Transportation Needs   Lack of Transportation (Medical): No   Lack of Transportation (Non-Medical): No  Physical Activity: Inactive   Days of Exercise per Week: 0 days   Minutes of Exercise per Session: 0 min  Stress: No Stress Concern Present   Feeling of Stress : Not at all  Social Connections: Socially Isolated   Frequency of Communication with Friends and Family: More than three times a week   Frequency of Social Gatherings with Friends and Family: Once a week   Attends Religious Services: Never   Marine scientist or Organizations: No   Attends Archivist Meetings: Never   Marital Status: Widowed    Allergies:  Allergies  Allergen  Reactions   Amoxicillin Hives   Hydrocodone Nausea And Vomiting   Latex Swelling    Ankle Area   Lisinopril Cough   Ampicillin Hives and Nausea And Vomiting    Metabolic Disorder Labs: Lab Results  Component Value Date   HGBA1C 5.2 08/12/2020   MPG 117 05/18/2020   MPG 108 12/02/2019   No results found for: PROLACTIN Lab Results  Component Value Date  CHOL 183 10/20/2020   TRIG 102 10/20/2020   HDL 61 10/20/2020   CHOLHDL 2.5 12/02/2019   VLDL 23 08/12/2016   LDLCALC 104 (H) 10/20/2020   LDLCALC 88 08/12/2020   Lab Results  Component Value Date   TSH 0.401 (L) 10/20/2020   TSH 0.430 (L) 08/12/2020    Therapeutic Level Labs: No results found for: LITHIUM No results found for: VALPROATE No components found for:  CBMZ  Current Medications: Current Outpatient Medications  Medication Sig Dispense Refill   ALPRAZolam (XANAX) 1 MG tablet TAKE (1) TABLET BY MOUTH (4) TIMES DAILY AS NEEDED FOR ANXIETY. 120 tablet 2   cetirizine (ZYRTEC) 10 MG tablet Take 1 tablet (10 mg total) by mouth daily. 30 tablet 11   diclofenac Sodium (VOLTAREN) 1 % GEL APPLY 2 GRAMS TO THE AFFECTED AREAS FOUR TIMES A DAY. 300 g 0   escitalopram (LEXAPRO) 20 MG tablet Take 1.5 tablets (30 mg total) by mouth daily. 45 tablet 2   eszopiclone 3 MG TABS Take 1 tablet (3 mg total) by mouth at bedtime as needed. Take immediately before bedtime 30 tablet 2   fluticasone (FLONASE) 50 MCG/ACT nasal spray INSTILL 2 SPRAYS IN EACH NOSTRIL TWICE DAILY AS DIRECTED. 16 g 11   gabapentin (NEURONTIN) 600 MG tablet Take 1 tablet (600 mg total) by mouth 3 (three) times daily. 90 tablet 0   ibuprofen (ADVIL) 800 MG tablet TAKE 1 TABLET BY MOUTH EVERY 8 HOURS AS NEEDED. 30 tablet 0   lamoTRIgine (LAMICTAL) 100 MG tablet Take 1 tablet (100 mg total) by mouth 2 (two) times daily. 60 tablet 2   losartan (COZAAR) 50 MG tablet TAKE ONE TABLET BY MOUTH ONCE DAILY. 90 tablet 0   nystatin (MYCOSTATIN/NYSTOP) powder APPLY TO THE  AFFECTED AREAS FOUR TIMES DAILY AS DIRECTED. 60 g 0   omeprazole (PRILOSEC) 40 MG capsule TAKE (1) CAPSULE BY MOUTH ONCE DAILY FOR ACID REFLUX. 90 capsule 1   ondansetron (ZOFRAN-ODT) 4 MG disintegrating tablet Take 1 tablet (4 mg total) by mouth every 8 (eight) hours as needed for nausea or vomiting. 20 tablet 0   tiZANidine (ZANAFLEX) 4 MG tablet TAKE 1 TABLET BY MOUTH ONCE EVERY 8 HOURS AS NEEDED FOR MUSCLE SPASMS. 90 tablet 0   traMADol (ULTRAM) 50 MG tablet Take 1 tablet (50 mg total) by mouth every 12 (twelve) hours as needed. 60 tablet 0   UNABLE TO FIND Med Name: Hand remote to lift chair. 1 Product 0   No current facility-administered medications for this visit.     Musculoskeletal: Strength & Muscle Tone: na Gait & Station: na Patient leans: N/A  Psychiatric Specialty Exam: Review of Systems  Musculoskeletal:  Positive for arthralgias and gait problem.  All other systems reviewed and are negative.  There were no vitals taken for this visit.There is no height or weight on file to calculate BMI.  General Appearance: NA  Eye Contact:  NA  Speech:  Clear and Coherent  Volume:  Normal  Mood:  Euthymic  Affect:  NA  Thought Process:  Goal Directed  Orientation:  Full (Time, Place, and Person)  Thought Content: Rumination   Suicidal Thoughts:  No  Homicidal Thoughts:  No  Memory:  Immediate;   Good Recent;   Good Remote;   Good  Judgement:  Good  Insight:  Fair  Psychomotor Activity:  Decreased  Concentration:  Concentration: Good and Attention Span: Good  Recall:  Good  Fund of Knowledge: Good  Language:  Good  Akathisia:  No  Handed:  Right  AIMS (if indicated): not done  Assets:  Communication Skills Desire for Improvement Resilience Social Support  ADL's:  Intact  Cognition: WNL  Sleep:  Good   Screenings: PHQ2-9    Flowsheet Row Video Visit from 03/26/2021 in [facility] Video Visit from 12/23/2020 in [facility] Office Visit from 10/20/2020 in [facility] Video Visit from 09/17/2020 in [facility] from 08/20/2020 in [facility]  PHQ-2 Total Score 0 3 1 1  0  PHQ-9 Total Score -- 7 -- -- --      Flowsheet Row Video Visit from 03/26/2021 in [facility] from 03/16/2021 in [place] Video Visit from 12/23/2020 in [facility]  C-SSRS RISK CATEGORY No Risk No Risk No Risk        Assessment and Plan: Patient is a 60 year old female with a history of PTSD and anxiety.  She is doing fairly well despite her physical problems.  She will continue Lexapro 30 mg daily for depression, Lamictal 100 mg twice daily for mood stabilization, Xanax 1 mg 4 times daily for anxiety and Lunesta 3 mg at bedtime for sleep.  She will return to see me in 3 months   [name], MD 03/26/2021, 9:20 AM

## 2021-04-22 ENCOUNTER — Ambulatory Visit (HOSPITAL_COMMUNITY): Payer: Commercial Managed Care - HMO | Attending: Orthopedic Surgery | Admitting: Physical Therapy

## 2021-04-23 ENCOUNTER — Other Ambulatory Visit: Payer: Self-pay | Admitting: Nurse Practitioner

## 2021-04-26 ENCOUNTER — Encounter: Payer: Self-pay | Admitting: Nurse Practitioner

## 2021-04-26 ENCOUNTER — Other Ambulatory Visit: Payer: Self-pay

## 2021-04-26 ENCOUNTER — Ambulatory Visit (INDEPENDENT_AMBULATORY_CARE_PROVIDER_SITE_OTHER): Payer: Commercial Managed Care - HMO | Admitting: Nurse Practitioner

## 2021-04-26 VITALS — BP 151/81 | HR 107 | Ht 64.0 in | Wt 290.0 lb

## 2021-04-26 DIAGNOSIS — E785 Hyperlipidemia, unspecified: Secondary | ICD-10-CM | POA: Diagnosis not present

## 2021-04-26 DIAGNOSIS — M170 Bilateral primary osteoarthritis of knee: Secondary | ICD-10-CM

## 2021-04-26 DIAGNOSIS — I10 Essential (primary) hypertension: Secondary | ICD-10-CM

## 2021-04-26 MED ORDER — IBUPROFEN 800 MG PO TABS: 800.0000 mg | ORAL_TABLET | Freq: Three times a day (TID) | ORAL | 0 refills | Status: DC | PRN

## 2021-04-26 MED ORDER — GABAPENTIN 600 MG PO TABS: 600.0000 mg | ORAL_TABLET | Freq: Three times a day (TID) | ORAL | 0 refills | Status: DC

## 2021-04-26 MED ORDER — NYSTATIN 100000 UNIT/GM EX POWD: CUTANEOUS | 0 refills | Status: DC

## 2021-04-26 MED ORDER — DICLOFENAC SODIUM 1 % EX GEL: CUTANEOUS | 0 refills | Status: DC

## 2021-04-26 NOTE — Assessment & Plan Note (Addendum)
Wt Readings from Last 3 Encounters:  04/26/21 290 lb (131.5 kg)  03/16/21 289 lb (131.1 kg)  10/20/20 277 lb (125.6 kg)  wants medication for weight loss. Pt decided to leave before the end of the visist today.will follw up on this at next visit.

## 2021-04-26 NOTE — Assessment & Plan Note (Signed)
followed by otho. Refilled voltaron gel, ibuprofen and gabapentin.  Pt refused toradol injection today. I discussed with pt that I can refer her to pain management if she would like a referral , she stated no , that she does not want to get addicted to narcotics.  Follow  up wit otho

## 2021-04-26 NOTE — Patient Instructions (Signed)

## 2021-04-26 NOTE — Assessment & Plan Note (Signed)
DASH diet and commitment to daily physical activity for a minimum of 30 minutes discussed and encouraged, as a part of hypertension management. The importance of attaining a healthy weight is also discussed.  BP/Weight 04/26/2021 03/16/2021 10/20/2020 08/20/2020 08/17/2020 06/02/2020 0/98/1191  Systolic BP 478 295 621 - 308 657 846  Diastolic BP 81 962 68 - 81 72 86  Wt. (Lbs) 290 289 277 280 280 283 288  BMI 49.78 51.19 47.55 48.06 49.6 48.58 49.44  Some encounter information is confidential and restricted. Go to Review Flowsheets activity to see all data.  continue current med, pt refused a blood pressure recheck today, she states I just want to go home

## 2021-04-26 NOTE — Progress Notes (Signed)
° ° °  Annette Galvan     MRN: [id number]      DOB: 10-21-1960   HPI Annette Galvan is here for follow up and re-evaluation of chronic medical conditions, medication management and review of any available recent lab and radiology data.  Preventive health is updated, specifically  Cancer screening and Immunization.   Questions or concerns regarding consultations or procedures which the PT has had in the interim are  addressed. The PT denies any adverse reactions to current medications since the last visit.   Pt c/o bilateral knee pain, her aid has not been coming, she will call the aid company to get someone else that will be helping her at home. She has been see otho for her pain.Pt became very tearful during the visit today, states that she wanst to go back home, refused toradol injection for her knee pain which she stated is 10/10. I told pt  that I can refer her to pain management if she still need tramadol. She verbalised understanding.    Pt stated that she has been eating too much, wants to  know if there is a med that can help her with weight loss. 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  Pt refused covid vaccine, shingles vaccine and pneumonia vaccine, need to get vaccines explained to the pt she verbalized understanding but stated that she is not going to get the vaccines   Pt encouraged to get her labs done, she states that she has eaten today and will get labs done tomorrow.   ROS Denies recent fever or chills. Denies chest congestion, productive cough or wheezing. Denies chest pains, palpitations and leg swelling Has bilateral knee joint pain,  with imitation in mobility. Denies depression, anxiety, SI, HI    PE  BP (!) 151/81 (BP Location: Right Arm, Patient Position: Sitting, Cuff Size: Large)    Pulse (!) 107    Ht 5\' 4"  (1.626 m)    Wt 290 lb (131.5 kg)    SpO2 96%    BMI 49.78 kg/m   Patient alert and oriented and in no cardiopulmonary  distress.  Chest: Clear to auscultation bilaterally.  CVS: S1, S2 no murmurs, no S3.Regular rate.  Psych: Good eye contact,  Memory intact not anxious or depressed appearing.Pt is very tearful, denies SI, HI  PT refused ROS and examinations of other systems , states that  she want to go home now, emotional support given to patient.     Assessment & Plan

## 2021-04-27 ENCOUNTER — Telehealth: Payer: Self-pay

## 2021-04-27 DIAGNOSIS — M25512 Pain in left shoulder: Secondary | ICD-10-CM

## 2021-04-27 DIAGNOSIS — M170 Bilateral primary osteoarthritis of knee: Secondary | ICD-10-CM

## 2021-04-27 DIAGNOSIS — G8929 Other chronic pain: Secondary | ICD-10-CM

## 2021-04-27 NOTE — Telephone Encounter (Signed)
Patient called was upset at her visit yesterday and asked if clinic nurse will give her a call to discuss her Tramadol.  Call back # [phone].

## 2021-04-27 NOTE — Telephone Encounter (Signed)
L/m for pt to return call

## 2021-04-28 NOTE — Telephone Encounter (Signed)
Returned patients call, she apologized for leaving upset the other day at her appointment. She would like to proceed with a referral to pain management for them to manage her Tramadol medication. Would also like to continue seeing you as well. Ok to place referral?

## 2021-04-28 NOTE — Telephone Encounter (Signed)
Patient returning call. Call back # [phone].

## 2021-04-28 NOTE — Addendum Note (Signed)
Addended by: Glee Arvin D on: 04/28/2021 11:25 AM   Modules accepted: Orders

## 2021-04-30 ENCOUNTER — Encounter: Payer: Self-pay | Admitting: Nurse Practitioner

## 2021-04-30 ENCOUNTER — Other Ambulatory Visit: Payer: Self-pay | Admitting: Nurse Practitioner

## 2021-04-30 NOTE — Telephone Encounter (Signed)
Dismiss letter sent 04/30/21 per Kristin Bruins

## 2021-05-19 ENCOUNTER — Ambulatory Visit (HOSPITAL_COMMUNITY): Payer: Commercial Managed Care - HMO | Admitting: Physical Therapy

## 2021-05-25 ENCOUNTER — Other Ambulatory Visit: Payer: Self-pay

## 2021-05-25 ENCOUNTER — Ambulatory Visit (INDEPENDENT_AMBULATORY_CARE_PROVIDER_SITE_OTHER): Payer: Medicare Other | Admitting: Nurse Practitioner

## 2021-05-25 ENCOUNTER — Encounter: Payer: Self-pay | Admitting: Nurse Practitioner

## 2021-05-25 VITALS — BP 134/76 | HR 97 | Temp 97.3°F | Wt 289.0 lb

## 2021-05-25 DIAGNOSIS — J302 Other seasonal allergic rhinitis: Secondary | ICD-10-CM

## 2021-05-25 DIAGNOSIS — R5382 Chronic fatigue, unspecified: Secondary | ICD-10-CM | POA: Diagnosis not present

## 2021-05-25 DIAGNOSIS — I10 Essential (primary) hypertension: Secondary | ICD-10-CM

## 2021-05-25 DIAGNOSIS — E0590 Thyrotoxicosis, unspecified without thyrotoxic crisis or storm: Secondary | ICD-10-CM

## 2021-05-25 DIAGNOSIS — Z7689 Persons encountering health services in other specified circumstances: Secondary | ICD-10-CM | POA: Diagnosis not present

## 2021-05-25 MED ORDER — DICLOFENAC SODIUM 1 % EX GEL: 2.0000 g | Freq: Four times a day (QID) | CUTANEOUS | 2 refills | Status: DC

## 2021-05-25 MED ORDER — LOSARTAN POTASSIUM 50 MG PO TABS: 50.0000 mg | ORAL_TABLET | Freq: Every day | ORAL | 1 refills | Status: DC

## 2021-05-25 MED ORDER — TIZANIDINE HCL 4 MG PO TABS: 4.0000 mg | ORAL_TABLET | Freq: Four times a day (QID) | ORAL | 0 refills | Status: DC | PRN

## 2021-05-25 MED ORDER — IBUPROFEN 800 MG PO TABS: 800.0000 mg | ORAL_TABLET | Freq: Three times a day (TID) | ORAL | 0 refills | Status: DC | PRN

## 2021-05-25 MED ORDER — NYSTATIN 100000 UNIT/GM EX POWD: 1.0000 "application " | Freq: Three times a day (TID) | CUTANEOUS | 0 refills | Status: DC | PRN

## 2021-05-25 MED ORDER — GABAPENTIN 300 MG PO CAPS: 600.0000 mg | ORAL_CAPSULE | Freq: Three times a day (TID) | ORAL | 3 refills | Status: DC

## 2021-05-25 NOTE — Addendum Note (Signed)
Addended by: Claire Shown on: 05/25/2021 03:40 PM   Modules accepted: Orders

## 2021-05-25 NOTE — Progress Notes (Signed)
Subjective:    Patient ID: Annette Galvan, female    DOB: Oct 23, 1960, 61 y.o.   MRN: [id number]  HPI  Patient here to establish care.  Patient states that she needs refills on her regular medication.    Patient previously on tramadol 50 mg for chronic pain and would like to know if she should receive more.  Patient has history of fibromyalgia, degenerative disc disease, bilateral arthritic knees.   Patient also interested in a weight loss medication.  Patient in need of knee replacement however her weight excludes her from being a candidate for any surgery.  Patient states she needs something to get her started.    Pt fell on flower bed yesterday and bruised left arm.   Review of Systems  Musculoskeletal:        Chronic muscle pain  All other systems reviewed and are negative.     Objective:   Physical Exam Constitutional:      General: She is not in acute distress.    Appearance: Normal appearance. She is obese. She is not ill-appearing or toxic-appearing.  HENT:     Head: Normocephalic.     Nose: Rhinorrhea present. No congestion.     Mouth/Throat:     Mouth: Mucous membranes are moist.  Cardiovascular:     Rate and Rhythm: Normal rate and regular rhythm.     Pulses: Normal pulses.     Heart sounds: Normal heart sounds. No murmur heard. Pulmonary:     Effort: Pulmonary effort is normal. No respiratory distress.     Breath sounds: Normal breath sounds. No wheezing.  Musculoskeletal:        General: Tenderness present. No swelling, deformity or signs of injury.     Comments: Bilateral knee tenderness  Skin:    General: Skin is warm.     Capillary Refill: Capillary refill takes less than 2 seconds.     Findings: Bruising present.     Comments: Healing bruise noted to left forearm  Neurological:     General: No focal deficit present.     Mental Status: She is alert and oriented to person, place, and time.     Cranial Nerves: No cranial nerve deficit.     Sensory: No  sensory deficit.     Motor: No weakness.     Coordination: Coordination normal.     Gait: Gait abnormal.     Comments: Abnormal gait due to obesity  Psychiatric:        Mood and Affect: Mood normal.        Behavior: Behavior normal.           Assessment & Plan:   1. Encounter to establish care Adult wellness-complete.wellness physical was conducted today. Importance of diet and exercise were discussed in detail.  In addition to this a discussion regarding safety was also covered. We also reviewed over immunizations and gave recommendations regarding current immunization needed for age.  In addition to this additional areas were also touched on including: Preventative health exams needed:  Colonoscopy due 2027 COVID UTD with 2 shot series Shingrix. Will discuss at next visit.   Patient was advised yearly wellness exam  -Return in 3 months for follow-up -All routine medication refilled. -Regarding patient's request for tramadol and phentermine, I do not feel like she is a good candidate for those medications at this time given her polypharmacy. -Pain management offered to patient however patient states she rather continue to get knee injections. -Recommend patient  try to decrease calories by using fitness pal app or another app similar to it.  Attempted concern lean meats and vegetables also recommend patient use sit on or similar app for physical exercise for about 30 minutes a day 3-5 times a week.  2. Primary hypertension -Blood pressure today is 134/76.  Goal blood pressure 140/90 met -We will get A1c, lipid to assess cardiovascular risk. -Continue taking blood pressure medication as prescribed. - HgB A1c - Microalbumin / creatinine urine ratio - CMP14+EGFR  3. Chronic fatigue -Unsure of etiology.  However, we will check vitamin D, presence of anemia, thyroid etiologies - Vitamin D (25 hydroxy) - CBC with Differential - TSH + free T4 -Follow-up in 3 months  4.  Seasonal Allergies -Take allergy medication as prescribed for seasonal allergies.

## 2021-05-26 LAB — CBC WITH DIFFERENTIAL/PLATELET
Basophils Absolute: 0 10*3/uL (ref 0.0–0.2)
Basos: 0 %
EOS (ABSOLUTE): 0.1 10*3/uL (ref 0.0–0.4)
Eos: 1 %
Hematocrit: 44.7 % (ref 34.0–46.6)
Hemoglobin: 14.7 g/dL (ref 11.1–15.9)
Immature Grans (Abs): 0 10*3/uL (ref 0.0–0.1)
Immature Granulocytes: 0 %
Lymphocytes Absolute: 2.4 10*3/uL (ref 0.7–3.1)
Lymphs: 33 %
MCH: 30.3 pg (ref 26.6–33.0)
MCHC: 32.9 g/dL (ref 31.5–35.7)
MCV: 92 fL (ref 79–97)
Monocytes Absolute: 0.5 10*3/uL (ref 0.1–0.9)
Monocytes: 6 %
Neutrophils Absolute: 4.4 10*3/uL (ref 1.4–7.0)
Neutrophils: 60 %
Platelets: 333 10*3/uL (ref 150–450)
RBC: 4.85 x10E6/uL (ref 3.77–5.28)
RDW: 12.2 % (ref 11.7–15.4)
WBC: 7.4 10*3/uL (ref 3.4–10.8)

## 2021-05-26 LAB — CMP14+EGFR
ALT: 12 IU/L (ref 0–32)
AST: 21 IU/L (ref 0–40)
Albumin/Globulin Ratio: 1.8 (ref 1.2–2.2)
Albumin: 4.9 g/dL (ref 3.8–4.9)
Alkaline Phosphatase: 99 IU/L (ref 44–121)
BUN/Creatinine Ratio: 19 (ref 12–28)
BUN: 16 mg/dL (ref 8–27)
Bilirubin Total: 0.2 mg/dL (ref 0.0–1.2)
CO2: 24 mmol/L (ref 20–29)
Calcium: 9.4 mg/dL (ref 8.7–10.3)
Chloride: 102 mmol/L (ref 96–106)
Creatinine, Ser: 0.86 mg/dL (ref 0.57–1.00)
Globulin, Total: 2.7 g/dL (ref 1.5–4.5)
Glucose: 96 mg/dL (ref 70–99)
Potassium: 4.6 mmol/L (ref 3.5–5.2)
Sodium: 140 mmol/L (ref 134–144)
Total Protein: 7.6 g/dL (ref 6.0–8.5)
eGFR: 77 mL/min/{1.73_m2} (ref >59)

## 2021-05-26 LAB — HEMOGLOBIN A1C
Est. average glucose Bld gHb Est-mCnc: 117 mg/dL
Hgb A1c MFr Bld: 5.7 % — ABNORMAL HIGH (ref 4.8–5.6)

## 2021-05-26 LAB — VITAMIN D 25 HYDROXY (VIT D DEFICIENCY, FRACTURES): Vit D, 25-Hydroxy: 14 ng/mL — ABNORMAL LOW (ref 30.0–100.0)

## 2021-05-26 LAB — MICROALBUMIN / CREATININE URINE RATIO
Creatinine, Urine: 123.7 mg/dL
Microalb/Creat Ratio: 10 mg/g creat (ref 0–29)
Microalbumin, Urine: 11.8 ug/mL

## 2021-05-27 ENCOUNTER — Telehealth: Payer: Self-pay | Admitting: Family Medicine

## 2021-05-27 ENCOUNTER — Other Ambulatory Visit: Payer: Self-pay | Admitting: Nurse Practitioner

## 2021-05-27 DIAGNOSIS — E785 Hyperlipidemia, unspecified: Secondary | ICD-10-CM | POA: Insufficient documentation

## 2021-05-27 DIAGNOSIS — E559 Vitamin D deficiency, unspecified: Secondary | ICD-10-CM | POA: Insufficient documentation

## 2021-05-27 MED ORDER — VITAMIN D (ERGOCALCIFEROL) 1.25 MG (50000 UNIT) PO CAPS: 50000.0000 [IU] | ORAL_CAPSULE | ORAL | 0 refills | Status: DC

## 2021-05-27 MED ORDER — ROSUVASTATIN CALCIUM 5 MG PO TABS: 5.0000 mg | ORAL_TABLET | Freq: Every day | ORAL | 3 refills | Status: AC

## 2021-05-27 NOTE — Progress Notes (Signed)
Hello Annette Galvan!  I have reviewed your lab work.  Your cholesterol is elevated.  It is recommended that you start a statin.  I have ordered a statin and sent it to your preferred pharmacy.  Taking a statin has been shown to greatly decrease your risk of cardiovascular disease.  If you start to take this medication I will need to see you back in 6 weeks to retest her lab work and make sure that you are doing well on this medication.  Your vitamin D is also low I will be sending in a vitamin D supplement that you will take once a week for 6 weeks.  At the end of those 6 weeks I would need to recheck your labs.  Your A1c is in prediabetic range.  The way to improve this is to diet and exercise.  It eat a well-balanced meal with plenty of lean meats and vegetables.  And try to exercise for 30 minutes a day for 3-5 times a week.  Thyroid hormone look normal.  Your blood count look normal.  And your kidney, liver, electrolytes are doing well.  Please make another appointment to see me in 6 weeks.  Regarding your request for tramadol and phentermine.  After a thorough look through your medical history I do not believe taking tramadol or phentermine is a good health option for you at this time.  If you would like another referral to pain management please let me know.  As far as weight loss, calorie deficit is the best way to do this combined with exercise as tolerated.  A good way to count your calories is using the FitnessPal app.  Hope to see you in 6 weeks.  [name]

## 2021-05-27 NOTE — Telephone Encounter (Signed)
Pt called and asked for her lab work results and if [place] had sent in her 2 rx to [facility].   [phone]

## 2021-05-27 NOTE — Telephone Encounter (Signed)
Patient has been informed per provider notes and recommendations, she verbalizes understanding.

## 2021-06-01 ENCOUNTER — Other Ambulatory Visit (HOSPITAL_COMMUNITY): Payer: Self-pay | Admitting: Psychiatry

## 2021-06-09 LAB — SPECIMEN STATUS REPORT

## 2021-06-11 ENCOUNTER — Telehealth: Payer: Self-pay | Admitting: *Deleted

## 2021-06-14 ENCOUNTER — Other Ambulatory Visit: Payer: Self-pay | Admitting: Nurse Practitioner

## 2021-06-14 DIAGNOSIS — M25562 Pain in left knee: Secondary | ICD-10-CM

## 2021-06-14 DIAGNOSIS — G8929 Other chronic pain: Secondary | ICD-10-CM

## 2021-06-14 DIAGNOSIS — Z79899 Other long term (current) drug therapy: Secondary | ICD-10-CM

## 2021-06-14 MED ORDER — TIZANIDINE HCL 4 MG PO TABS: 4.0000 mg | ORAL_TABLET | Freq: Four times a day (QID) | ORAL | 0 refills | Status: DC | PRN

## 2021-06-14 MED ORDER — IBUPROFEN 800 MG PO TABS: 800.0000 mg | ORAL_TABLET | Freq: Three times a day (TID) | ORAL | 4 refills | Status: DC | PRN

## 2021-06-14 MED ORDER — IBUPROFEN 800 MG PO TABS: 800.0000 mg | ORAL_TABLET | Freq: Three times a day (TID) | ORAL | 0 refills | Status: DC | PRN

## 2021-06-14 NOTE — Telephone Encounter (Addendum)
[facility], [name], NP  You 1 hour ago (3:32 PM)   Ordered. However, taking that much ibuprofen for long periods of time is not safe. I ordered her 30 days supply with 4 refills. If she is going through that much ibuprofen for pain relief, I highly suggest we get her in to see pain management. I don't mind doing short courses of Tramadol, but prescribing tramadol without addressing what is causing the pain is not advantageous from a health standpoint. Please offer pain management to the patient again.   [place]     Patient notified and stated she will go ahead with pain management because her knees are bone on bone and the ortho just does cortisone shots every 3 months

## 2021-06-14 NOTE — Telephone Encounter (Signed)
[facility], [name], NP    Urgent referral placed to pain clinic.   [name]

## 2021-06-14 NOTE — Telephone Encounter (Signed)
Patient states she has already picked up the refills from 05/25/21 and is out . Patient states she takes 3-4 Ibuprofen every day and takes 4 Zanaflex everyday to function (since she didn't get her Tramadol refills) and would like sent in for a 30 day supply how she takes it (#120 each)   [facility]

## 2021-06-15 ENCOUNTER — Ambulatory Visit: Payer: Commercial Managed Care - HMO | Admitting: Registered Nurse

## 2021-06-17 LAB — LIPID PANEL W/O CHOL/HDL RATIO
Cholesterol, Total: 207 mg/dL — ABNORMAL HIGH (ref 100–199)
HDL: 67 mg/dL (ref >39)
LDL Chol Calc (NIH): 108 mg/dL — ABNORMAL HIGH (ref 0–99)
Triglycerides: 184 mg/dL — ABNORMAL HIGH (ref 0–149)
VLDL Cholesterol Cal: 32 mg/dL (ref 5–40)

## 2021-06-17 LAB — TSH: TSH: 0.52 u[IU]/mL (ref 0.450–4.500)

## 2021-06-17 LAB — SPECIMEN STATUS REPORT

## 2021-06-24 ENCOUNTER — Other Ambulatory Visit: Payer: Self-pay

## 2021-06-24 ENCOUNTER — Encounter (HOSPITAL_COMMUNITY): Payer: Self-pay | Admitting: Psychiatry

## 2021-06-24 ENCOUNTER — Telehealth (INDEPENDENT_AMBULATORY_CARE_PROVIDER_SITE_OTHER): Payer: Medicare Other | Admitting: Psychiatry

## 2021-06-24 DIAGNOSIS — F4310 Post-traumatic stress disorder, unspecified: Secondary | ICD-10-CM | POA: Diagnosis not present

## 2021-06-24 MED ORDER — ESZOPICLONE 3 MG PO TABS: 3.0000 mg | ORAL_TABLET | Freq: Every evening | ORAL | 3 refills | Status: DC | PRN

## 2021-06-24 MED ORDER — ALPRAZOLAM 1 MG PO TABS: ORAL_TABLET | ORAL | 3 refills | Status: DC

## 2021-06-24 MED ORDER — LAMOTRIGINE 100 MG PO TABS: 100.0000 mg | ORAL_TABLET | Freq: Two times a day (BID) | ORAL | 3 refills | Status: DC

## 2021-06-24 MED ORDER — ESCITALOPRAM OXALATE 20 MG PO TABS: 30.0000 mg | ORAL_TABLET | Freq: Every day | ORAL | 2 refills | Status: DC

## 2021-06-24 NOTE — Progress Notes (Signed)
Virtual Visit via Telephone Note  I connected with Annette Galvan on 06/24/21 at  9:00 AM EST by telephone and verified that I am speaking with the correct person using two identifiers.  Location: Patient: home Provider: office   I discussed the limitations, risks, security and privacy concerns of performing an evaluation and management service by telephone and the availability of in person appointments. I also discussed with the patient that there may be a patient responsible charge related to this service. The patient expressed understanding and agreed to proceed.      I discussed the assessment and treatment plan with the patient. The patient was provided an opportunity to ask questions and all were answered. The patient agreed with the plan and demonstrated an understanding of the instructions.   The patient was advised to call back or seek an in-person evaluation if the symptoms worsen or if the condition fails to improve as anticipated.  I provided 15 minutes of non-face-to-face time during this encounter.   [name], MD  [facility] MD/PA/NP OP Progress Note  06/24/2021 9:17 AM Annette Galvan  MRN:  [id number]  Chief Complaint:  Chief Complaint  Patient presents with   Depression   Anxiety   Follow-up   HPI: This patient is a 61year-old widowed white female who lives alone in [place].  She has 2 grown daughters and 3 grandchildren.  She is on disability.  The patient returns for follow-up after 3 months regarding her depression and anxiety.  She states that overall she is doing well.  She has a new provider through family medicine who she states is helping with her overall health.  She is no longer on pain medication.  She is going to the gym several times a week.  She is really trying to lose weight.  She states that her mood has been stable and she denies significant depression or anxiety.  She is sleeping well with the Lunesta. Visit Diagnosis:    ICD-10-CM   1. PTSD  (post-traumatic stress disorder)  F43.10       Past Psychiatric History: History of depression anxiety and a remote history of substance abuse.  Admissions in her 51s and 56s  Past Medical History:  Past Medical History:  Diagnosis Date   Arthritis    needs 2 knee replacements; also has left shoulder and left hip pain   Back pain, chronic    Bilateral chronic knee pain    Bipolar 1 disorder (HCC)    Bulging disc    Fibromyalgia    GERD (gastroesophageal reflux disease)    HSV-2 (herpes simplex virus 2) infection 2013   Hx of trichomoniasis    Itching 06/26/2013   Leg pain    Trichimoniasis 06/26/2013   UTI (lower urinary tract infection) 10/12/2012   Vaginal discharge 06/26/2013   Vaginal irritation 12/05/2013   Vaginal odor 12/28/2012   Had discharge +clue   Yeast infection 02/04/2013    Past Surgical History:  Procedure Laterality Date   ABDOMINAL HYSTERECTOMY     total   BREAST ENHANCEMENT SURGERY Right    COLONOSCOPY WITH PROPOFOL N/A 12/07/2015   Procedure: COLONOSCOPY WITH PROPOFOL;  Surgeon: Daneil Dolin, MD;  Location: [facility];  Service: Endoscopy;  Laterality: N/A;  17   TUBAL LIGATION      Family Psychiatric History: see below  Family History:  Family History  Problem Relation Age of Onset   Hypertension Mother    Diabetes Mother  Depression Mother    Hyperlipidemia Mother    Fibromyalgia Mother    Arthritis Mother    Cancer Father        bone   Hyperlipidemia Father    Hypertension Father    Bipolar disorder Father    Depression Sister    Hyperlipidemia Sister    Fibromyalgia Sister    Fibromyalgia Sister    Bipolar disorder Other    Drug abuse Other    Alcohol abuse Other    Hypertension Brother    Other Brother        shingles; back problems   Diabetes Paternal Grandmother    Alzheimer's disease Maternal Grandmother    Obesity Daughter    Other Daughter        back problems   Bipolar disorder Daughter    Depression Daughter     Other Daughter        on pain meds   Colon cancer Neg Hx     Social History:  Social History   Socioeconomic History   Marital status: Widowed    Spouse name: Not on file   Number of children: Not on file   Years of education: Not on file   Highest education level: Not on file  Occupational History   Occupation: Disabled  Tobacco Use   Smoking status: Light Smoker    Packs/day: 0.25    Years: 25.00    Pack years: 6.25    Types: Cigarettes   Smokeless tobacco: Never   Tobacco comments:    2 cigarettes daily  Vaping Use   Vaping Use: Never used  Substance and Sexual Activity   Alcohol use: Yes    Alcohol/week: 0.0 standard drinks    Comment: occasional   Drug use: No    Comment: cocaine, clean for 4.5 years as of 12/04/2015   Sexual activity: Yes    Birth control/protection: Surgical    Comment: hysterectomy  Other Topics Concern   Not on file  Social History Narrative   Not on file   Social Determinants of Health   Financial Resource Strain: Low Risk    Difficulty of Paying Living Expenses: Not hard at all  Food Insecurity: No Food Insecurity   Worried About Charity fundraiser in the Last Year: Never true   [place] in the Last Year: Never true  Transportation Needs: No Transportation Needs   Lack of Transportation (Medical): No   Lack of Transportation (Non-Medical): No  Physical Activity: Inactive   Days of Exercise per Week: 0 days   Minutes of Exercise per Session: 0 min  Stress: No Stress Concern Present   Feeling of Stress : Not at all  Social Connections: Socially Isolated   Frequency of Communication with Friends and Family: More than three times a week   Frequency of Social Gatherings with Friends and Family: Once a week   Attends Religious Services: Never   Marine scientist or Organizations: No   Attends Archivist Meetings: Never   Marital Status: Widowed    Allergies:  Allergies  Allergen Reactions   Amoxicillin  Hives   Hydrocodone Nausea And Vomiting   Latex Swelling    Ankle Area   Lisinopril Cough   Ampicillin Hives and Nausea And Vomiting    Metabolic Disorder Labs: Lab Results  Component Value Date   HGBA1C 5.7 (H) 05/25/2021   MPG 117 05/18/2020   MPG 108 12/02/2019   No results found for:  PROLACTIN Lab Results  Component Value Date   CHOL 207 (H) 05/25/2021   TRIG 184 (H) 05/25/2021   HDL 67 05/25/2021   CHOLHDL 2.5 12/02/2019   VLDL 23 08/12/2016   LDLCALC 108 (H) 05/25/2021   LDLCALC 104 (H) 10/20/2020   Lab Results  Component Value Date   TSH 0.520 05/25/2021   TSH 0.401 (L) 10/20/2020    Therapeutic Level Labs: No results found for: LITHIUM No results found for: VALPROATE No components found for:  CBMZ  Current Medications: Current Outpatient Medications  Medication Sig Dispense Refill   ALPRAZolam (XANAX) 1 MG tablet TAKE (1) TABLET BY MOUTH (4) TIMES DAILY AS NEEDED FOR ANXIETY. 120 tablet 3   cetirizine (ZYRTEC) 10 MG tablet Take 1 tablet (10 mg total) by mouth daily. 30 tablet 11   diclofenac Sodium (VOLTAREN) 1 % GEL Apply 2 g topically 4 (four) times daily. 150 g 2   eszopiclone 3 MG TABS Take 1 tablet (3 mg total) by mouth at bedtime as needed. Take immediately before bedtime 30 tablet 3   fluticasone (FLONASE) 50 MCG/ACT nasal spray INSTILL 2 SPRAYS IN EACH NOSTRIL TWICE DAILY AS DIRECTED. 16 g 11   gabapentin (NEURONTIN) 300 MG capsule Take 2 capsules (600 mg total) by mouth 3 (three) times daily. 90 capsule 3   ibuprofen (ADVIL) 800 MG tablet Take 1 tablet (800 mg total) by mouth every 8 (eight) hours as needed for moderate pain. Take with food. 30 tablet 4   lamoTRIgine (LAMICTAL) 100 MG tablet Take 1 tablet (100 mg total) by mouth 2 (two) times daily. 60 tablet 3   losartan (COZAAR) 50 MG tablet Take 1 tablet (50 mg total) by mouth daily. 90 tablet 1   nystatin (MYCOSTATIN/NYSTOP) powder Apply 1 application topically 3 (three) times daily as needed. 15  g 0   omeprazole (PRILOSEC) 40 MG capsule TAKE (1) CAPSULE BY MOUTH ONCE DAILY FOR ACID REFLUX. 90 capsule 0   rosuvastatin (CRESTOR) 5 MG tablet Take 1 tablet (5 mg total) by mouth daily. 90 tablet 3   tiZANidine (ZANAFLEX) 4 MG tablet Take 1 tablet (4 mg total) by mouth every 6 (six) hours as needed for muscle spasms. 120 tablet 0   UNABLE TO FIND Med Name: Hand remote to lift chair. 1 Product 0   Vitamin D, Ergocalciferol, (DRISDOL) 1.25 MG (50000 UNIT) CAPS capsule Take 1 capsule (50,000 Units total) by mouth every 7 (seven) days. 6 capsule 0   No current facility-administered medications for this visit.     Musculoskeletal: Strength & Muscle Tone: na Gait & Station: na Patient leans: N/A  Psychiatric Specialty Exam: Review of Systems  Musculoskeletal:  Positive for arthralgias and joint swelling.  All other systems reviewed and are negative.  There were no vitals taken for this visit.There is no height or weight on file to calculate BMI.  General Appearance: NA  Eye Contact:  NA  Speech:  Clear and Coherent  Volume:  Normal  Mood:  Euthymic  Affect:  NA  Thought Process:  Goal Directed  Orientation:  Full (Time, Place, and Person)  Thought Content: WDL   Suicidal Thoughts:  No  Homicidal Thoughts:  No  Memory:  Immediate;   Good Recent;   Good Remote;   Good  Judgement:  Good  Insight:  Fair  Psychomotor Activity:  Normal  Concentration:  Concentration: Good and Attention Span: Good  Recall:  Good  Fund of Knowledge: Good  Language: Good  Akathisia:  No  Handed:  Right  AIMS (if indicated): not done  Assets:  Communication Skills Desire for Improvement Resilience Social Support Talents/Skills  ADL's:  Intact  Cognition: WNL  Sleep:  Good   Screenings: PHQ2-9    Flowsheet Row Video Visit from 06/24/2021 in [facility] Video Visit from 03/26/2021 in [facility] Video Visit  from 12/23/2020 in [facility] Office Visit from 10/20/2020 in [facility] Video Visit from 09/17/2020 in [facility]  PHQ-2 Total Score 0 0 '3 1 1  '$ PHQ-9 Total Score -- -- 7 -- --      Flowsheet Row Video Visit from 06/24/2021 in [facility] Video Visit from 03/26/2021 in [facility] from 03/16/2021 in [facility] No Risk No Risk No Risk        Assessment and Plan: This patient is a 61 year old female with a history of PTSD and anxiety.  She continues to do well despite her physical problems.  She will continue Lexapro 30 mg daily for depression, Lamictal 100 mg twice daily for mood stabilization, Xanax 1 mg 4 times daily for anxiety and Lunesta 3 mg at bedtime for sleep.  She will return to see me in 4 months  Collaboration of Care: Collaboration of Care: Primary Care Provider AEB chart notes are available to PCP through the epic system  Patient/Guardian was advised Release of Information must be obtained prior to any record release in order to collaborate their care with an outside provider. Patient/Guardian was advised if they have not already done so to contact the registration department to sign all necessary forms in order for [place] to release information regarding their care.   Consent: Patient/Guardian gives verbal consent for treatment and assignment of benefits for services provided during this visit. Patient/Guardian expressed understanding and agreed to proceed.    [name], MD 06/24/2021, 9:17 AM

## 2021-07-03 ENCOUNTER — Other Ambulatory Visit: Payer: Self-pay | Admitting: Nurse Practitioner

## 2021-07-13 ENCOUNTER — Ambulatory Visit: Payer: Medicare Other | Admitting: Nurse Practitioner

## 2021-07-14 ENCOUNTER — Telehealth: Payer: Self-pay | Admitting: Radiology

## 2021-07-14 NOTE — Telephone Encounter (Signed)
sure

## 2021-07-14 NOTE — Telephone Encounter (Signed)
Patient called today asking if she could see you.  She has bil knee pain/OA and has been seeing [name] in [facility] for knee injections.  She lives 5 minutes from here and wants to come back to see you.  She saw you many years ago.  She actually had 3 appts with [name] in [place] last year.  She had cancelled/no showed them due to covid and her mom being sick/going into SNF.  I believe someone told her we could not reschedule her at that time with [name], so she did go back to see [name] in [place].  She is asking if we Annette Galvan schedule an appt for her to see you, please advise.  ?

## 2021-07-15 ENCOUNTER — Ambulatory Visit: Payer: Medicare Other | Admitting: Nurse Practitioner

## 2021-07-26 ENCOUNTER — Ambulatory Visit: Payer: Medicare Other

## 2021-07-26 ENCOUNTER — Ambulatory Visit (INDEPENDENT_AMBULATORY_CARE_PROVIDER_SITE_OTHER): Payer: Medicare Other | Admitting: Orthopedic Surgery

## 2021-07-26 VITALS — BP 134/82 | HR 80 | Ht 64.0 in | Wt 290.0 lb

## 2021-07-26 DIAGNOSIS — M170 Bilateral primary osteoarthritis of knee: Secondary | ICD-10-CM

## 2021-07-26 DIAGNOSIS — G8929 Other chronic pain: Secondary | ICD-10-CM

## 2021-07-26 DIAGNOSIS — M1711 Unilateral primary osteoarthritis, right knee: Secondary | ICD-10-CM

## 2021-07-26 DIAGNOSIS — M1712 Unilateral primary osteoarthritis, left knee: Secondary | ICD-10-CM

## 2021-07-26 DIAGNOSIS — M25562 Pain in left knee: Secondary | ICD-10-CM | POA: Diagnosis not present

## 2021-07-26 DIAGNOSIS — M25561 Pain in right knee: Secondary | ICD-10-CM | POA: Diagnosis not present

## 2021-07-26 NOTE — Patient Instructions (Signed)
Goal for weight 225lbs ? ?Talk to your primary care doctor about weight loss medication ? ?You can get an injection every 6 months  ?

## 2021-07-26 NOTE — Progress Notes (Signed)
Chief Complaint  ?Patient presents with  ? Knee Pain  ?  Bilateral knee pain ?  ? ? ?HPI: 61 year old female with morbid obesity borderline diabetes bipolar disorder fibromyalgia hypertension sleep apnea severe arthritis of the knee presents for evaluation of bilateral knee pain ? ?She says they are past hurting their bone-on-bone she has trouble the cruciate and the stairs her knees lock up in the morning and she has had several falls ? ?She is down to taking 1 ibuprofen per day.  She is his primary care recommend that she reduce her ibuprofen load and she was also recommended to go to pain management to continue her tramadol ? ?I looked in the chart and it was recommended that she seek pain management specialist to manage her tramadol prescriptions ? ?Past Medical History:  ?Diagnosis Date  ? Arthritis   ? needs 2 knee replacements; also has left shoulder and left hip pain  ? Back pain, chronic   ? Bilateral chronic knee pain   ? Bipolar 1 disorder ([place])   ? Bulging disc   ? Fibromyalgia   ? GERD (gastroesophageal reflux disease)   ? HSV-2 (herpes simplex virus 2) infection 2013  ? Hx of trichomoniasis   ? Itching 06/26/2013  ? Leg pain   ? Trichimoniasis 06/26/2013  ? UTI (lower urinary tract infection) 10/12/2012  ? Vaginal discharge 06/26/2013  ? Vaginal irritation 12/05/2013  ? Vaginal odor 12/28/2012  ? Had discharge +clue  ? Yeast infection 02/04/2013  ? ? ?BP 134/82   Pulse 80   Ht '5\' 4"'$  (1.626 m)   Wt 290 lb (131.5 kg)   BMI 49.78 kg/m?  ? ? ?General appearance: Well-developed well-nourished no gross deformities ? ?Cardiovascular normal pulse and perfusion normal color without edema ? ?Neurologically no sensation loss or deficits or pathologic reflexes ? ?Psychological: Awake alert and oriented x3 mood and affect normal ? ?Skin no lacerations or ulcerations no nodularity no palpable masses, no erythema or nodularity ? ?Musculoskeletal: Limited range of motion both knees with varus patient limps and waddles  this affects both knees ? ?She has decreased range of motion tenderness along the medial joint lines without joint effusion she has good strength and stability to the knees ? ?Imaging she has grade 4 arthritis in both knees with multiple osteophytes subluxation of the joint and joint space narrowing medially again surrounding osteophytes around the joints ? ?The patient meets the AMA guidelines for Morbid (severe) obesity with a BMI > 40.0 and I have recommended weight loss. ? ? ? ?A/P ? ?Encounter Diagnosis  ?Name Primary?  ? Chronic pain of both knees Yes  ? ? ?There is little we can do in the setting.  We did offer her injections which were done she can get 1 every 6 months but the main focus of her treatment should be weight loss with a goal weight of 225 pounds ? ?If she can get to this weight then she will be a candidate to have both knees replaced in sequential order ? ?Procedure note for bilateral knee injections ? ?Procedure note left knee injection verbal consent was obtained to inject left knee joint ? ?Timeout was completed to confirm the site of injection ? ?The medications used were 40 mg depomedrol and 3 cc of 1% lidocaine  ?Anesthesia was provided by ethyl chloride and the skin was prepped with alcohol. ? ?After cleaning the skin with alcohol a 20-gauge needle was used to inject the left knee joint. There were  no complications. A sterile bandage was applied. ? ? ?Procedure note right knee injection verbal consent was obtained to inject right knee joint ? ?Timeout was completed to confirm the site of injection ? ?The medications used were 40 mg depomedrol and 3 cc of 1% lidocaine  ?Anesthesia was provided by ethyl chloride and the skin was prepped with alcohol. ? ?After cleaning the skin with alcohol a 20-gauge needle was used to inject the right knee joint. There were no complications. A sterile bandage was applied. ? ? 225lbs ? ? ? ?

## 2021-07-29 ENCOUNTER — Encounter: Payer: Self-pay | Admitting: Nurse Practitioner

## 2021-07-29 ENCOUNTER — Other Ambulatory Visit: Payer: Self-pay | Admitting: Nurse Practitioner

## 2021-07-29 ENCOUNTER — Ambulatory Visit (INDEPENDENT_AMBULATORY_CARE_PROVIDER_SITE_OTHER): Payer: Medicare Other | Admitting: Nurse Practitioner

## 2021-07-29 VITALS — BP 150/72 | HR 71 | Temp 98.8°F | Wt 288.8 lb

## 2021-07-29 DIAGNOSIS — M170 Bilateral primary osteoarthritis of knee: Secondary | ICD-10-CM | POA: Diagnosis not present

## 2021-07-29 DIAGNOSIS — R8290 Unspecified abnormal findings in urine: Secondary | ICD-10-CM

## 2021-07-29 DIAGNOSIS — I10 Essential (primary) hypertension: Secondary | ICD-10-CM | POA: Diagnosis not present

## 2021-07-29 DIAGNOSIS — R300 Dysuria: Secondary | ICD-10-CM | POA: Diagnosis not present

## 2021-07-29 DIAGNOSIS — E559 Vitamin D deficiency, unspecified: Secondary | ICD-10-CM

## 2021-07-29 DIAGNOSIS — E785 Hyperlipidemia, unspecified: Secondary | ICD-10-CM | POA: Diagnosis not present

## 2021-07-29 LAB — POCT URINALYSIS DIPSTICK
Bilirubin, UA: NEGATIVE
Blood, UA: NEGATIVE
Glucose, UA: NEGATIVE
Ketones, UA: NEGATIVE
Nitrite, UA: NEGATIVE
Protein, UA: POSITIVE — AB
Spec Grav, UA: 1.025 (ref 1.010–1.025)
Urobilinogen, UA: 0.2 E.U./dL
pH, UA: 6.5 (ref 5.0–8.0)

## 2021-07-29 MED ORDER — IBUPROFEN 800 MG PO TABS: 800.0000 mg | ORAL_TABLET | Freq: Three times a day (TID) | ORAL | 2 refills | Status: DC | PRN

## 2021-07-29 MED ORDER — CETIRIZINE HCL 10 MG PO TABS: 10.0000 mg | ORAL_TABLET | Freq: Every day | ORAL | 11 refills | Status: DC

## 2021-07-29 MED ORDER — FLUTICASONE PROPIONATE 50 MCG/ACT NA SUSP: NASAL | 11 refills | Status: DC

## 2021-07-29 MED ORDER — LOSARTAN POTASSIUM 50 MG PO TABS: 50.0000 mg | ORAL_TABLET | Freq: Every day | ORAL | 1 refills | Status: DC

## 2021-07-29 MED ORDER — OMEPRAZOLE 40 MG PO CPDR: DELAYED_RELEASE_CAPSULE | ORAL | 0 refills | Status: DC

## 2021-07-29 MED ORDER — NYSTATIN 100000 UNIT/GM EX POWD: 1.0000 "application " | Freq: Three times a day (TID) | CUTANEOUS | 0 refills | Status: DC | PRN

## 2021-07-29 MED ORDER — CEFDINIR 300 MG PO CAPS: 300.0000 mg | ORAL_CAPSULE | Freq: Two times a day (BID) | ORAL | 0 refills | Status: AC

## 2021-07-29 MED ORDER — GABAPENTIN 300 MG PO CAPS: ORAL_CAPSULE | ORAL | 0 refills | Status: DC

## 2021-07-29 MED ORDER — TIZANIDINE HCL 4 MG PO TABS: 4.0000 mg | ORAL_TABLET | Freq: Four times a day (QID) | ORAL | 0 refills | Status: DC | PRN

## 2021-07-29 MED ORDER — ACETAMINOPHEN 500 MG PO TABS: 500.0000 mg | ORAL_TABLET | Freq: Four times a day (QID) | ORAL | 0 refills | Status: AC | PRN

## 2021-07-29 MED ORDER — DICLOFENAC SODIUM 1 % EX GEL: 2.0000 g | Freq: Four times a day (QID) | CUTANEOUS | 2 refills | Status: DC

## 2021-07-29 NOTE — Progress Notes (Signed)
? ?Subjective:  ? ? Patient ID: Annette Galvan, female    DOB: 13-May-1960, 61 y.o.   MRN: [id number] ? ?HPI ? ?61 year old female patient with history of hypertension, neuropathy, prediabetes, osteoarthritis, chronic knee pain, chronic back pain, bipolar disorder, fibromyalgia, and morbid obesity here for follow-up. ? ?Patient has chronic osteoarthritis of her knee and was recently seen by Dr. Aline Brochure for injections.  Patient needs to have bilateral knee replacements however she is not a candidate due to her weight. ? ?Patient appears to be very motivated to lose weight.  Patient had several questions regarding healthier foods to eat, portion sizes, healthy fats. ? ?Patient states that she continues to have pain to bilateral knees and is looking for a way to manage her pain so that she can try to lose weight.  Patient has been advised that she should not walk or use the exercise bike due to the condition of her knees.  However, swelling was encouraged.  Per [facility], patient is not a candidate for GLP-1's and would have to pay out-of-pocket for therapy if she wants that.  And due to patient's current medication list she is not a candidate for phentermine. ? ?Patient sees Dr. Harrington Challenger for psychiatry.  Patient currently taking Xanax 1 mg by mouth 4 times a day, Lexapro, Lamictal, and eszopiclone. ? ?Patient was offered to go to pain management however due to transportation issues patient has not been yet. ? ?Patient states that she is allergic to hydrocodone and that tramadol does not help with her pain.  Patient requesting oxycodone if possible. ? ?Patient also requesting a refill on her medications (Zyrtec, Voltaren gel, Flonase, gabapentin, losartan, nystatin, Prilosec, and Zanaflex). ? ? ?Review of Systems  ?Musculoskeletal:   ?     Bilateral knee pain  ? ?   ?Objective:  ? Physical Exam ?Vitals reviewed.  ?Constitutional:   ?   General: She is not in acute distress. ?   Appearance: Normal appearance. She is  obese. She is not ill-appearing, toxic-appearing or diaphoretic.  ?HENT:  ?   Head: Normocephalic and atraumatic.  ?Cardiovascular:  ?   Rate and Rhythm: Normal rate and regular rhythm.  ?   Pulses: Normal pulses.  ?   Heart sounds: Normal heart sounds. No murmur heard. ?Pulmonary:  ?   Effort: Pulmonary effort is normal. No respiratory distress.  ?   Breath sounds: Normal breath sounds. No wheezing.  ?Musculoskeletal:     ?   General: No swelling.  ?   Comments: Grossly intact.  Patient has unsteady gait and wobbles when she walks.  Crepitus and pain to movement of bilateral knees.  Range of motion of bilateral knees intact but painful.  No swelling of bilateral knees or bilateral ankles noted.  ?Skin: ?   General: Skin is warm.  ?   Capillary Refill: Capillary refill takes less than 2 seconds.  ?Neurological:  ?   Mental Status: She is alert.  ?   Comments: Grossly intact  ?Psychiatric:     ?   Mood and Affect: Mood normal.     ?   Behavior: Behavior normal.  ? ? ?   ?Assessment & Plan:  ? ?1. Class 3 obesity ([place]) ?-Continue to stay as active as possible.  Continue to swim and do water exercises to put less stress on knees. ?-Continue to create a calorie deficit by making smarter food choices. ?-Avoid high calorie dense foods such as cakes, baked goods, fried  foods, fast food, and highly processed foods. ?-Referral placed for weight management ?-Return to clinic in 4 weeks ? ?2. Primary osteoarthritis of both knees ?-Continue to follow-up with Ortho ?-After further review of patient's chart patient is not a good candidate for opioid use. ?-Patient has polypharmacy with high-dose Xanax, gabapentin, eszopiclone, and Zanaflex.  Risk of respiratory depression elevated due to polypharmacy. ?-Ibuprofen 800 mg as needed every 8 hours ordered for patient.  Patient instructed to monitor for signs symptoms of GI bleed.  Patient instructed to take ibuprofen with food. ?-Tylenol 500 mg as needed every 6 hours ordered for  pain. ?-Pain management referral placed ?-Return to clinic in 4 weeks ? ?3. Primary hypertension ?-Blood pressure today 172/69.  Upon recheck blood pressure was 150/72.  Patient states that she has not taking blood pressure medication today. ?-Encourage patient to take blood pressure medication as prescribed ?- CMP14+EGFR ? ?4. Urine odor ?-Omnicef 342m BID x5 days for urinary tract infection.  Bactrim considered however due to patient's history of medication allergies and polypharmacy decided to do medication the patient has tolerated previously. ?-Urinary culture pending. ?- POCT urinalysis dipstick ?- Urine Culture ?-Return to clinic if symptoms not better within 2 to 3 days of antibiotic use. ? ?5. Hyperlipidemia, unspecified hyperlipidemia type ?-Patient started on rosuvastatin 5 mg during last visit. ?-We will check kidney function and lipid panel ?- CMP14+EGFR ?- Lipid Profile ? ?6.  Vitamin D deficiency ?-We will recheck vitamin D ? ?  ?Note:  This document was prepared using Dragon voice recognition software and may include unintentional dictation errors. ? ?

## 2021-07-30 LAB — CMP14+EGFR
ALT: 17 IU/L (ref 0–32)
AST: 20 IU/L (ref 0–40)
Albumin/Globulin Ratio: 1.7 (ref 1.2–2.2)
Albumin: 4.8 g/dL (ref 3.8–4.9)
Alkaline Phosphatase: 97 IU/L (ref 44–121)
BUN/Creatinine Ratio: 17 (ref 12–28)
BUN: 15 mg/dL (ref 8–27)
Bilirubin Total: 0.3 mg/dL (ref 0.0–1.2)
CO2: 22 mmol/L (ref 20–29)
Calcium: 9.5 mg/dL (ref 8.7–10.3)
Chloride: 104 mmol/L (ref 96–106)
Creatinine, Ser: 0.88 mg/dL (ref 0.57–1.00)
Globulin, Total: 2.9 g/dL (ref 1.5–4.5)
Glucose: 102 mg/dL — ABNORMAL HIGH (ref 70–99)
Potassium: 4.7 mmol/L (ref 3.5–5.2)
Sodium: 141 mmol/L (ref 134–144)
Total Protein: 7.7 g/dL (ref 6.0–8.5)
eGFR: 75 mL/min/{1.73_m2} (ref >59)

## 2021-07-30 LAB — LIPID PANEL
Chol/HDL Ratio: 2.1 ratio (ref 0.0–4.4)
Cholesterol, Total: 159 mg/dL (ref 100–199)
HDL: 75 mg/dL (ref >39)
LDL Chol Calc (NIH): 68 mg/dL (ref 0–99)
Triglycerides: 85 mg/dL (ref 0–149)
VLDL Cholesterol Cal: 16 mg/dL (ref 5–40)

## 2021-08-01 LAB — SPECIMEN STATUS REPORT

## 2021-08-01 LAB — URINE CULTURE

## 2021-08-03 ENCOUNTER — Telehealth: Payer: Self-pay | Admitting: *Deleted

## 2021-08-03 NOTE — Telephone Encounter (Signed)
Patient left message stating she would like a refill of her antibiotic for UTI because she does not think it completely knocked it out - she also would like script for a yeast infection caused by the antibiotics ? ?[place] ?

## 2021-08-04 ENCOUNTER — Other Ambulatory Visit: Payer: Self-pay | Admitting: Nurse Practitioner

## 2021-08-04 MED ORDER — CEFDINIR 300 MG PO CAPS: 300.0000 mg | ORAL_CAPSULE | Freq: Two times a day (BID) | ORAL | 0 refills | Status: AC

## 2021-08-04 MED ORDER — FLUCONAZOLE 150 MG PO TABS: 150.0000 mg | ORAL_TABLET | Freq: Once | ORAL | 0 refills | Status: AC

## 2021-08-09 NOTE — Telephone Encounter (Signed)
Patient notified and stated she has picked up medication and will come in for follow up as advised ?

## 2021-08-09 NOTE — Telephone Encounter (Signed)
[facility], [name], NP   ? ?I placed an order for a few more days of abx and medication for a yeast infection. However, please call the patient and let her know that if she continues to have symptoms that I would need to see her back in clinic for re-evaluation.  ? ?Thank you.  ? ?[name]   ? ?

## 2021-08-20 ENCOUNTER — Other Ambulatory Visit: Payer: Self-pay | Admitting: Nurse Practitioner

## 2021-08-23 ENCOUNTER — Telehealth: Payer: Self-pay | Admitting: Family Medicine

## 2021-08-23 NOTE — Telephone Encounter (Signed)
No answer unable to leave a message for patient to call back and schedule Medicare Annual Wellness Visit (AWV) in office.  ? ?If unable to come into the office for AWV,  please offer to do virtually or by telephone. ? ?Last AWV: 08/20/2020 ? ?Please schedule at anytime with [facility]. ? ?30 minute appointment for Virtual or phone ? ?45 minute appointment for in office or Initial virtual/phone ? ?Any questions, please contact me at [phone]  ?  ?  ?

## 2021-08-26 ENCOUNTER — Ambulatory Visit: Payer: Medicare Other | Admitting: Nurse Practitioner

## 2021-08-28 ENCOUNTER — Other Ambulatory Visit: Payer: Self-pay | Admitting: Nurse Practitioner

## 2021-09-21 ENCOUNTER — Other Ambulatory Visit: Payer: Self-pay | Admitting: Family Medicine

## 2021-10-01 ENCOUNTER — Other Ambulatory Visit: Payer: Self-pay | Admitting: Nurse Practitioner

## 2021-10-01 NOTE — Telephone Encounter (Signed)
Patient has been informed per drs notes. 

## 2021-10-04 ENCOUNTER — Other Ambulatory Visit: Payer: Self-pay | Admitting: Nurse Practitioner

## 2021-10-11 ENCOUNTER — Telehealth (INDEPENDENT_AMBULATORY_CARE_PROVIDER_SITE_OTHER): Payer: Medicare Other | Admitting: Psychiatry

## 2021-10-11 ENCOUNTER — Encounter (HOSPITAL_COMMUNITY): Payer: Self-pay | Admitting: Psychiatry

## 2021-10-11 DIAGNOSIS — F319 Bipolar disorder, unspecified: Secondary | ICD-10-CM

## 2021-10-11 MED ORDER — TRAZODONE HCL 150 MG PO TABS: 150.0000 mg | ORAL_TABLET | Freq: Every day | ORAL | 2 refills | Status: DC

## 2021-10-11 MED ORDER — LAMOTRIGINE 100 MG PO TABS: 100.0000 mg | ORAL_TABLET | Freq: Two times a day (BID) | ORAL | 3 refills | Status: DC

## 2021-10-11 MED ORDER — ESCITALOPRAM OXALATE 20 MG PO TABS: 30.0000 mg | ORAL_TABLET | Freq: Every day | ORAL | 2 refills | Status: DC

## 2021-10-11 MED ORDER — ALPRAZOLAM 1 MG PO TABS: ORAL_TABLET | ORAL | 3 refills | Status: DC

## 2021-10-12 ENCOUNTER — Telehealth: Payer: Self-pay | Admitting: Nurse Practitioner

## 2021-10-13 ENCOUNTER — Other Ambulatory Visit: Payer: Self-pay | Admitting: Nurse Practitioner

## 2021-10-13 NOTE — Telephone Encounter (Signed)
Patient says she is on disability and can't afford OTC nasal spray.  Please advise. Thank you

## 2021-10-14 ENCOUNTER — Other Ambulatory Visit: Payer: Self-pay | Admitting: Nurse Practitioner

## 2021-10-14 DIAGNOSIS — J302 Other seasonal allergic rhinitis: Secondary | ICD-10-CM

## 2021-10-14 MED ORDER — MOMETASONE FUROATE 50 MCG/ACT NA SUSP: 2.0000 | Freq: Every day | NASAL | 2 refills | Status: AC

## 2021-10-14 NOTE — Telephone Encounter (Signed)
Patient informed that prescription sent in. Verbalized understanding.

## 2021-10-21 ENCOUNTER — Ambulatory Visit: Payer: Medicare Other | Admitting: Nurse Practitioner

## 2021-10-22 ENCOUNTER — Other Ambulatory Visit: Payer: Self-pay | Admitting: *Deleted

## 2021-10-22 MED ORDER — GABAPENTIN 300 MG PO CAPS: ORAL_CAPSULE | ORAL | 0 refills | Status: DC

## 2021-10-25 ENCOUNTER — Encounter: Payer: Self-pay | Admitting: Nurse Practitioner

## 2021-10-25 ENCOUNTER — Ambulatory Visit (INDEPENDENT_AMBULATORY_CARE_PROVIDER_SITE_OTHER): Payer: Medicare Other | Admitting: Nurse Practitioner

## 2021-10-25 VITALS — BP 102/60 | Wt 278.6 lb

## 2021-10-25 DIAGNOSIS — R5382 Chronic fatigue, unspecified: Secondary | ICD-10-CM | POA: Diagnosis not present

## 2021-10-25 DIAGNOSIS — I10 Essential (primary) hypertension: Secondary | ICD-10-CM | POA: Diagnosis not present

## 2021-10-25 DIAGNOSIS — G8929 Other chronic pain: Secondary | ICD-10-CM

## 2021-10-25 DIAGNOSIS — E785 Hyperlipidemia, unspecified: Secondary | ICD-10-CM

## 2021-10-25 DIAGNOSIS — M25561 Pain in right knee: Secondary | ICD-10-CM | POA: Diagnosis not present

## 2021-10-25 DIAGNOSIS — M25562 Pain in left knee: Secondary | ICD-10-CM | POA: Diagnosis not present

## 2021-10-25 DIAGNOSIS — R7303 Prediabetes: Secondary | ICD-10-CM | POA: Diagnosis not present

## 2021-10-25 MED ORDER — TIZANIDINE HCL 4 MG PO TABS: ORAL_TABLET | ORAL | 0 refills | Status: DC

## 2021-10-25 NOTE — Progress Notes (Unsigned)
   Subjective:    Patient ID: Annette Galvan, female    DOB: [birth year], 61 y.o.   MRN: [id number]  HPI Pt arrives for follow up. Pt states that she was "deathly ill" for about 9 days. Pt had leg cramps, headache, no energy and was urinating on self in bed. Pt had bowel movement in hallway due to taking to much milk of magnesia. Pt also having knee pain. Pt also would like to be tested for HIV today. Pt states PCP was going to recheck potassium labs.    Review of Systems     Objective:   Physical Exam        Assessment & Plan:

## 2021-10-26 ENCOUNTER — Telehealth: Payer: Self-pay | Admitting: *Deleted

## 2021-10-26 ENCOUNTER — Encounter: Payer: Self-pay | Admitting: Nurse Practitioner

## 2021-10-26 LAB — LIPID PANEL
Chol/HDL Ratio: 2.8 ratio (ref 0.0–4.4)
Cholesterol, Total: 150 mg/dL (ref 100–199)
HDL: 53 mg/dL (ref >39)
LDL Chol Calc (NIH): 74 mg/dL (ref 0–99)
Triglycerides: 129 mg/dL (ref 0–149)
VLDL Cholesterol Cal: 23 mg/dL (ref 5–40)

## 2021-10-26 LAB — CMP14+EGFR
ALT: 11 IU/L (ref 0–32)
AST: 13 IU/L (ref 0–40)
Albumin/Globulin Ratio: 1.4 (ref 1.2–2.2)
Albumin: 4.2 g/dL (ref 3.8–4.9)
Alkaline Phosphatase: 78 IU/L (ref 44–121)
BUN/Creatinine Ratio: 24 (ref 12–28)
BUN: 21 mg/dL (ref 8–27)
Bilirubin Total: <0.2 mg/dL (ref 0.0–1.2)
CO2: 22 mmol/L (ref 20–29)
Calcium: 9 mg/dL (ref 8.7–10.3)
Chloride: 102 mmol/L (ref 96–106)
Creatinine, Ser: 0.86 mg/dL (ref 0.57–1.00)
Globulin, Total: 3 g/dL (ref 1.5–4.5)
Glucose: 99 mg/dL (ref 70–99)
Potassium: 4.1 mmol/L (ref 3.5–5.2)
Sodium: 141 mmol/L (ref 134–144)
Total Protein: 7.2 g/dL (ref 6.0–8.5)
eGFR: 77 mL/min/{1.73_m2} (ref >59)

## 2021-10-26 LAB — CBC WITH DIFFERENTIAL/PLATELET
Basophils Absolute: 0 10*3/uL (ref 0.0–0.2)
Basos: 0 %
EOS (ABSOLUTE): 0.1 10*3/uL (ref 0.0–0.4)
Eos: 1 %
Hematocrit: 38.5 % (ref 34.0–46.6)
Hemoglobin: 12.4 g/dL (ref 11.1–15.9)
Immature Grans (Abs): 0 10*3/uL (ref 0.0–0.1)
Immature Granulocytes: 0 %
Lymphocytes Absolute: 1.7 10*3/uL (ref 0.7–3.1)
Lymphs: 27 %
MCH: 30 pg (ref 26.6–33.0)
MCHC: 32.2 g/dL (ref 31.5–35.7)
MCV: 93 fL (ref 79–97)
Monocytes Absolute: 0.4 10*3/uL (ref 0.1–0.9)
Monocytes: 7 %
Neutrophils Absolute: 4.1 10*3/uL (ref 1.4–7.0)
Neutrophils: 65 %
Platelets: 262 10*3/uL (ref 150–450)
RBC: 4.14 x10E6/uL (ref 3.77–5.28)
RDW: 13.2 % (ref 11.7–15.4)
WBC: 6.3 10*3/uL (ref 3.4–10.8)

## 2021-10-26 LAB — HEMOGLOBIN A1C
Est. average glucose Bld gHb Est-mCnc: 111 mg/dL
Hgb A1c MFr Bld: 5.5 % (ref 4.8–5.6)

## 2021-10-26 LAB — HIV ANTIBODY (ROUTINE TESTING W REFLEX): HIV Screen 4th Generation wRfx: NONREACTIVE

## 2021-10-26 NOTE — Telephone Encounter (Signed)
Patient called and would like to know the results of her labs drawn yesterday

## 2021-10-28 ENCOUNTER — Other Ambulatory Visit: Payer: Self-pay | Admitting: *Deleted

## 2021-10-28 DIAGNOSIS — G8929 Other chronic pain: Secondary | ICD-10-CM

## 2021-10-29 NOTE — Telephone Encounter (Signed)
Patient notified - see result note 10/27/21

## 2021-11-01 ENCOUNTER — Telehealth: Payer: Self-pay | Admitting: *Deleted

## 2021-11-01 NOTE — Telephone Encounter (Signed)
Patient says she was being referred for pain management to a place in [place], [place]. She says she would like to see Dr. Merlene Laughter in [place], [place] for the pain management.  She is also having trouble with her sciatic nerve in both legs causing her not to be able to walk. She would like to get a shot for that if possible. Please advise. Thank you

## 2021-11-01 NOTE — Telephone Encounter (Signed)
Patient informed of md message. Verbalized understanding. Appt made.

## 2021-11-03 ENCOUNTER — Encounter: Payer: Self-pay | Admitting: Nurse Practitioner

## 2021-11-03 ENCOUNTER — Ambulatory Visit (INDEPENDENT_AMBULATORY_CARE_PROVIDER_SITE_OTHER): Payer: Medicare Other | Admitting: Nurse Practitioner

## 2021-11-03 VITALS — BP 154/74 | HR 75 | Temp 98.2°F | Wt 278.6 lb

## 2021-11-03 DIAGNOSIS — R300 Dysuria: Secondary | ICD-10-CM | POA: Diagnosis not present

## 2021-11-03 DIAGNOSIS — R062 Wheezing: Secondary | ICD-10-CM

## 2021-11-03 DIAGNOSIS — R110 Nausea: Secondary | ICD-10-CM | POA: Diagnosis not present

## 2021-11-03 DIAGNOSIS — M5386 Other specified dorsopathies, lumbar region: Secondary | ICD-10-CM | POA: Diagnosis not present

## 2021-11-03 DIAGNOSIS — E66813 Obesity, class 3: Secondary | ICD-10-CM

## 2021-11-03 DIAGNOSIS — R7303 Prediabetes: Secondary | ICD-10-CM

## 2021-11-03 LAB — POCT URINALYSIS DIP (CLINITEK)
Spec Grav, UA: >=1.03 — AB (ref 1.010–1.025)
pH, UA: 6 (ref 5.0–8.0)

## 2021-11-03 MED ORDER — ALBUTEROL SULFATE HFA 108 (90 BASE) MCG/ACT IN AERS: 2.0000 | INHALATION_SPRAY | Freq: Four times a day (QID) | RESPIRATORY_TRACT | 0 refills | Status: DC | PRN

## 2021-11-03 MED ORDER — METHYLPREDNISOLONE ACETATE 40 MG/ML IJ SUSP
40.0000 mg | Freq: Once | INTRAMUSCULAR | Status: AC
  Administered 2021-11-03: 40 mg via INTRAMUSCULAR

## 2021-11-03 MED ORDER — METFORMIN HCL 500 MG PO TABS: 500.0000 mg | ORAL_TABLET | Freq: Two times a day (BID) | ORAL | 3 refills | Status: DC

## 2021-11-03 MED ORDER — ONDANSETRON HCL 4 MG PO TABS: 4.0000 mg | ORAL_TABLET | Freq: Three times a day (TID) | ORAL | 0 refills | Status: DC | PRN

## 2021-11-03 NOTE — Progress Notes (Signed)
Subjective:    Patient ID: Annette Galvan, female    DOB: 1960/05/04, 61 y.o.   MRN: [id number]  HPI  Pt arrives due to wanting steroid shot for sciatica.  Patient states that her back pain and shooting leg pains have become increasingly painful.  Pt would also like urine checked to  make sure her UTI is gone.  Patient states that she occasionally will have frequency and lower abdominal pain at times.  Patient denies fever, chills, body aches, back pain.  Possible needing inhaler due to breathing.  Patient states that she has noticed difficulty breathing at night at times and occasional wheezing.  Patient thinks that she is reacting to the increased smog outside.    Has appt in [facility] for pain management.   Pt needing phenergan due to intermittent nausea from pain.   Also request weight loss pill.   Patient denies any chest pain, swelling, fever, body aches, chills.   Review of Systems  Respiratory:  Positive for shortness of breath.   Gastrointestinal:  Positive for nausea.  Musculoskeletal:  Positive for back pain.       Objective:   Physical Exam Vitals reviewed.  Constitutional:      General: She is not in acute distress.    Appearance: Normal appearance. She is normal weight. She is not ill-appearing, toxic-appearing or diaphoretic.  HENT:     Head: Normocephalic and atraumatic.  Cardiovascular:     Rate and Rhythm: Normal rate and regular rhythm.     Pulses: Normal pulses.     Heart sounds: Normal heart sounds. No murmur heard. Pulmonary:     Effort: Pulmonary effort is normal. No respiratory distress.     Breath sounds: Normal breath sounds. No wheezing.     Comments: Breathing normal today no wheezing noted on exam.  Patient satting 98% on room air. Abdominal:     General: Bowel sounds are normal. There is no distension.     Palpations: Abdomen is soft. There is no mass.     Tenderness: There is no abdominal tenderness. There is no right CVA tenderness, left CVA  tenderness, guarding or rebound.     Hernia: No hernia is present.  Musculoskeletal:     Comments: Grossly intact.  Unsteady gait occasionally walks with a cane.  Skin:    General: Skin is warm.     Capillary Refill: Capillary refill takes less than 2 seconds.  Neurological:     Mental Status: She is alert.     Comments: Grossly intact  Psychiatric:        Mood and Affect: Mood normal.        Behavior: Behavior normal.        Assessment & Plan:   1. Dysuria -Urinalysis negative for nitrates negative for leukocytes. - POCT URINALYSIS DIP (CLINITEK) -No further testing needed.  2. Wheezing -Likely related to decreased air quality. -We will supply patient with albuterol inhaler to use as needed. - albuterol (VENTOLIN HFA) 108 (90 Base) MCG/ACT inhaler; Inhale 2 puffs into the lungs every 6 (six) hours as needed for wheezing or shortness of breath.  Dispense: 8 g; Refill: 0 -Return to clinic if symptoms worsen.  3. Sciatica associated with disorder of lumbar spine -We will treat with steroid shot today for hopeful pain relief. - methylPREDNISolone acetate (DEPO-MEDROL) injection 40 mg -Patient has appointment with pain management and plans to see them to help with pain management. -Return to clinic as needed  4. Nausea -  Patient states that due to the amount of pain that she experiences she will oftentimes get nauseous.  Patient to have Zofran on hand in the event she needs it. - ondansetron (ZOFRAN) 4 MG tablet; Take 1 tablet (4 mg total) by mouth every 8 (eight) hours as needed for nausea or vomiting.  Dispense: 20 tablet; Refill: 0  5. Prediabetes/obesity -Patient requesting [phone] for weight loss however Medicaid and Medicare did not cover [employer] at this time. -Due to patient's polypharmacy and psychiatric medication do not feel comfortable prescribing phentermine at this time. -We will trial patient on metformin to help with prediabetes and for weight loss. - metFORMIN  (GLUCOPHAGE) 500 MG tablet; Take 1 tablet (500 mg total) by mouth 2 (two) times daily with a meal.  Dispense: 180 tablet; Refill: 3 -Return to clinic in approximately 3 months.    Note:  This document was prepared using Dragon voice recognition software and may include unintentional dictation errors. Note - This record has been created using [employer].  Chart creation errors have been sought, but may not always  have been located. Such creation errors do not reflect on  the standard of medical care.

## 2021-11-04 ENCOUNTER — Telehealth: Payer: Self-pay | Admitting: Nurse Practitioner

## 2021-11-04 NOTE — Telephone Encounter (Signed)
Pt calling in and states that she has had loss of appetite, nausea/diarrhea, smell is off and knee pain is worse since having DepoMedrol shot yesterday. Pt is wanting to know if this is normal. Please advise. Thank you  [phone]

## 2021-11-04 NOTE — Telephone Encounter (Signed)
I am so sorry to hear that. The steroid shot could possibly affect your appetite and cause some nausea. However, I do not believe it would affect your knee.   Annette Galvan   Left message to return call

## 2021-11-08 ENCOUNTER — Other Ambulatory Visit: Payer: Self-pay | Admitting: Nurse Practitioner

## 2021-11-08 NOTE — Telephone Encounter (Signed)
Left message to return call 

## 2021-11-18 ENCOUNTER — Other Ambulatory Visit: Payer: Self-pay | Admitting: Nurse Practitioner

## 2021-11-18 DIAGNOSIS — G8929 Other chronic pain: Secondary | ICD-10-CM

## 2021-11-19 ENCOUNTER — Other Ambulatory Visit: Payer: Self-pay | Admitting: Nurse Practitioner

## 2021-11-19 MED ORDER — GABAPENTIN 300 MG PO CAPS: ORAL_CAPSULE | ORAL | 0 refills | Status: DC

## 2021-11-19 NOTE — Telephone Encounter (Signed)
Refills sent in

## 2021-11-19 NOTE — Telephone Encounter (Signed)
Left message to return call 

## 2021-11-19 NOTE — Telephone Encounter (Signed)
Patient states she took some zofran is doing better and has lost some weight as advised.

## 2021-11-23 ENCOUNTER — Other Ambulatory Visit (HOSPITAL_COMMUNITY): Payer: Self-pay | Admitting: Psychiatry

## 2021-11-23 ENCOUNTER — Other Ambulatory Visit: Payer: Self-pay | Admitting: Nurse Practitioner

## 2021-11-23 DIAGNOSIS — R110 Nausea: Secondary | ICD-10-CM

## 2021-12-01 ENCOUNTER — Other Ambulatory Visit: Payer: Self-pay | Admitting: Nurse Practitioner

## 2021-12-01 DIAGNOSIS — M25571 Pain in right ankle and joints of right foot: Secondary | ICD-10-CM | POA: Diagnosis not present

## 2021-12-01 DIAGNOSIS — R062 Wheezing: Secondary | ICD-10-CM

## 2021-12-01 DIAGNOSIS — M170 Bilateral primary osteoarthritis of knee: Secondary | ICD-10-CM | POA: Diagnosis not present

## 2021-12-01 DIAGNOSIS — M25371 Other instability, right ankle: Secondary | ICD-10-CM | POA: Diagnosis not present

## 2021-12-01 DIAGNOSIS — G894 Chronic pain syndrome: Secondary | ICD-10-CM | POA: Diagnosis not present

## 2021-12-01 DIAGNOSIS — H5213 Myopia, bilateral: Secondary | ICD-10-CM | POA: Diagnosis not present

## 2021-12-06 ENCOUNTER — Other Ambulatory Visit: Payer: Self-pay | Admitting: Nurse Practitioner

## 2021-12-21 ENCOUNTER — Other Ambulatory Visit: Payer: Self-pay | Admitting: Nurse Practitioner

## 2021-12-21 DIAGNOSIS — G8929 Other chronic pain: Secondary | ICD-10-CM

## 2021-12-29 ENCOUNTER — Other Ambulatory Visit: Payer: Self-pay | Admitting: Nurse Practitioner

## 2021-12-31 DIAGNOSIS — H52223 Regular astigmatism, bilateral: Secondary | ICD-10-CM | POA: Diagnosis not present

## 2021-12-31 DIAGNOSIS — H524 Presbyopia: Secondary | ICD-10-CM | POA: Diagnosis not present

## 2022-01-07 ENCOUNTER — Other Ambulatory Visit: Payer: Self-pay | Admitting: Family Medicine

## 2022-01-07 DIAGNOSIS — R110 Nausea: Secondary | ICD-10-CM

## 2022-01-10 ENCOUNTER — Telehealth (INDEPENDENT_AMBULATORY_CARE_PROVIDER_SITE_OTHER): Payer: Medicare Other | Admitting: Psychiatry

## 2022-01-10 ENCOUNTER — Encounter (HOSPITAL_COMMUNITY): Payer: Self-pay | Admitting: Psychiatry

## 2022-01-10 DIAGNOSIS — F319 Bipolar disorder, unspecified: Secondary | ICD-10-CM | POA: Diagnosis not present

## 2022-01-10 MED ORDER — ALPRAZOLAM 1 MG PO TABS
ORAL_TABLET | ORAL | 3 refills | Status: DC
Start: 2022-01-10 — End: 2022-04-01

## 2022-01-10 MED ORDER — ESCITALOPRAM OXALATE 20 MG PO TABS: 30.0000 mg | ORAL_TABLET | Freq: Every day | ORAL | 2 refills | Status: DC

## 2022-01-10 MED ORDER — LAMOTRIGINE 100 MG PO TABS: 100.0000 mg | ORAL_TABLET | Freq: Two times a day (BID) | ORAL | 3 refills | Status: DC

## 2022-01-10 MED ORDER — TRAZODONE HCL 150 MG PO TABS: 150.0000 mg | ORAL_TABLET | Freq: Every day | ORAL | 2 refills | Status: DC

## 2022-01-10 NOTE — Progress Notes (Signed)
Virtual Visit via Telephone Note  I connected with Annette Galvan on 01/10/22 at 10:00 AM EDT by telephone and verified that I am speaking with the correct person using two identifiers.  Location: Patient: home Provider: office   I discussed the limitations, risks, security and privacy concerns of performing an evaluation and management service by telephone and the availability of in person appointments. I also discussed with the patient that there may be a patient responsible charge related to this service. The patient expressed understanding and agreed to proceed.      I discussed the assessment and treatment plan with the patient. The patient was provided an opportunity to ask questions and all were answered. The patient agreed with the plan and demonstrated an understanding of the instructions.   The patient was advised to call back or seek an in-person evaluation if the symptoms worsen or if the condition fails to improve as anticipated.  I provided 12 minutes of non-face-to-face time during this encounter.   [name], MD  [facility] MD/PA/NP OP Progress Note  01/10/2022 10:09 AM Annette Galvan  MRN:  [id number]  Chief Complaint:  Chief Complaint  Patient presents with   Depression   Anxiety   Follow-up   HPI: This patient is a 61year-old widowed white female who lives alone in [place].  She has 2 grown daughters and 3 grandchildren.  She is on disability. The patient returns for follow-up after 3 months regarding depression and anxiety.  For the most part she is doing okay.  She has lost about 30 pounds by restricting her food.  She is exercising periodically.  She is still having a lot of knee pain but has to lose more weight in order to be eligible for any surgery.  She denies significant depression thoughts of self-harm or suicidal ideation.  Her anxiety is under good control.  She is sleeping better with the trazodone.  Visit Diagnosis:    ICD-10-CM   1. Bipolar 1  disorder (HCC)  F31.9       Past Psychiatric History: History of depression, anxiety and a remote history of substance abuse.  Admissions in her 60s and 60s  Past Medical History:  Past Medical History:  Diagnosis Date   Arthritis    needs 2 knee replacements; also has left shoulder and left hip pain   Back pain, chronic    Bilateral chronic knee pain    Bipolar 1 disorder (HCC)    Bulging disc    Fibromyalgia    GERD (gastroesophageal reflux disease)    HSV-2 (herpes simplex virus 2) infection 2013   Hx of trichomoniasis    Itching 06/26/2013   Leg pain    Trichimoniasis 06/26/2013   UTI (lower urinary tract infection) 10/12/2012   Vaginal discharge 06/26/2013   Vaginal irritation 12/05/2013   Vaginal odor 12/28/2012   Had discharge +clue   Yeast infection 02/04/2013    Past Surgical History:  Procedure Laterality Date   ABDOMINAL HYSTERECTOMY     total   BREAST ENHANCEMENT SURGERY Right    COLONOSCOPY WITH PROPOFOL N/A 12/07/2015   Procedure: COLONOSCOPY WITH PROPOFOL;  Surgeon: Daneil Dolin, MD;  Location: [facility];  Service: Endoscopy;  Laterality: N/A;  28   TUBAL LIGATION      Family Psychiatric History: See below  Family History:  Family History  Problem Relation Age of Onset   Hypertension Mother    Diabetes Mother    Depression Mother  Hyperlipidemia Mother    Fibromyalgia Mother    Arthritis Mother    Cancer Father        bone   Hyperlipidemia Father    Hypertension Father    Bipolar disorder Father    Depression Sister    Hyperlipidemia Sister    Fibromyalgia Sister    Fibromyalgia Sister    Bipolar disorder Other    Drug abuse Other    Alcohol abuse Other    Hypertension Brother    Other Brother        shingles; back problems   Diabetes Paternal Grandmother    Alzheimer's disease Maternal Grandmother    Obesity Daughter    Other Daughter        back problems   Bipolar disorder Daughter    Depression Daughter    Other Daughter         on pain meds   Colon cancer Neg Hx     Social History:  Social History   Socioeconomic History   Marital status: Widowed    Spouse name: Not on file   Number of children: Not on file   Years of education: Not on file   Highest education level: Not on file  Occupational History   Occupation: Disabled  Tobacco Use   Smoking status: Light Smoker    Packs/day: 0.25    Years: 25.00    Total pack years: 6.25    Types: Cigarettes   Smokeless tobacco: Never   Tobacco comments:    2 cigarettes daily  Vaping Use   Vaping Use: Never used  Substance and Sexual Activity   Alcohol use: Yes    Alcohol/week: 0.0 standard drinks of alcohol    Comment: occasional   Drug use: No    Comment: cocaine, clean for 4.5 years as of 12/04/2015   Sexual activity: Yes    Birth control/protection: Surgical    Comment: hysterectomy  Other Topics Concern   Not on file  Social History Narrative   Not on file   Social Determinants of Health   Financial Resource Strain: Low Risk  (08/20/2020)   Overall Financial Resource Strain (CARDIA)    Difficulty of Paying Living Expenses: Not hard at all  Food Insecurity: No Food Insecurity (08/20/2020)   Hunger Vital Sign    Worried About Running Out of Food in the Last Year: Never true    Ran Out of Food in the Last Year: Never true  Transportation Needs: No Transportation Needs (08/20/2020)   PRAPARE - Hydrologist (Medical): No    Lack of Transportation (Non-Medical): No  Physical Activity: Inactive (08/20/2020)   Exercise Vital Sign    Days of Exercise per Week: 0 days    Minutes of Exercise per Session: 0 min  Stress: No Stress Concern Present (08/20/2020)   [place]    Feeling of Stress : Not at all  Social Connections: Socially Isolated (08/20/2020)   Social Connection and Isolation Panel [NHANES]    Frequency of Communication with Friends and Family: More  than three times a week    Frequency of Social Gatherings with Friends and Family: Once a week    Attends Religious Services: Never    Marine scientist or Organizations: No    Attends Archivist Meetings: Never    Marital Status: Widowed    Allergies:  Allergies  Allergen Reactions   Amoxicillin Hives  Hydrocodone Nausea And Vomiting   Latex Swelling    Ankle Area   Lisinopril Cough   Ampicillin Hives and Nausea And Vomiting    Metabolic Disorder Labs: Lab Results  Component Value Date   HGBA1C 5.5 10/25/2021   MPG 117 05/18/2020   MPG 108 12/02/2019   No results found for: "PROLACTIN" Lab Results  Component Value Date   CHOL 150 10/25/2021   TRIG 129 10/25/2021   HDL 53 10/25/2021   CHOLHDL 2.8 10/25/2021   VLDL 23 08/12/2016   LDLCALC 74 10/25/2021   LDLCALC 68 07/29/2021   Lab Results  Component Value Date   TSH 0.520 05/25/2021   TSH 0.401 (L) 10/20/2020    Therapeutic Level Labs: No results found for: "LITHIUM" No results found for: "VALPROATE" No results found for: "CBMZ"  Current Medications: Current Outpatient Medications  Medication Sig Dispense Refill   ondansetron (ZOFRAN) 4 MG tablet TAKE (1) TABLET BY MOUTH EVERY EIGHT HOURS AS NEEDED. 20 tablet 0   acetaminophen (TYLENOL) 500 MG tablet Take 1 tablet (500 mg total) by mouth every 6 (six) hours as needed. 30 tablet 0   albuterol (VENTOLIN HFA) 108 (90 Base) MCG/ACT inhaler INHALE 2 PUFFS EVERY 6 HOURS ASNEEDED. 8.5 g 0   ALPRAZolam (XANAX) 1 MG tablet TAKE (1) TABLET BY MOUTH (4) TIMES DAILY AS NEEDED FOR ANXIETY. 120 tablet 3   cetirizine (ZYRTEC) 10 MG tablet Take 1 tablet (10 mg total) by mouth daily. 30 tablet 11   diclofenac Sodium (VOLTAREN) 1 % GEL Apply 2 g topically 4 (four) times daily. 150 g 2   escitalopram (LEXAPRO) 20 MG tablet Take 1.5 tablets (30 mg total) by mouth daily. 45 tablet 2   gabapentin (NEURONTIN) 300 MG capsule TAKE 2 CAPSULES BY MOUTH 3 TIMES DAILY.  180 capsule 0   ibuprofen (ADVIL) 800 MG tablet TAKE 1 TABLET BY MOUTH EVERY 8 HOURS AS NEEDED FOR MODERATE PAIN. TAKE WITH FOOD. 90 tablet 0   lamoTRIgine (LAMICTAL) 100 MG tablet Take 1 tablet (100 mg total) by mouth 2 (two) times daily. 60 tablet 3   losartan (COZAAR) 50 MG tablet Take 1 tablet (50 mg total) by mouth daily. 90 tablet 1   metFORMIN (GLUCOPHAGE) 500 MG tablet Take 1 tablet (500 mg total) by mouth 2 (two) times daily with a meal. 180 tablet 3   mometasone (NASONEX) 50 MCG/ACT nasal spray Place 2 sprays into the nose daily. 1 each 2   nystatin (MYCOSTATIN/NYSTOP) powder APPLY TO THE AFFECTED AREA 3 TIMES DAILY AS DIRECTED. 15 g 0   omeprazole (PRILOSEC) 40 MG capsule TAKE (1) CAPSULE BY MOUTH ONCE DAILY FOR ACID REFLUX. 90 capsule 0   rosuvastatin (CRESTOR) 5 MG tablet Take 1 tablet (5 mg total) by mouth daily. 90 tablet 3   tiZANidine (ZANAFLEX) 4 MG tablet TAKE 1 TABLET BY MOUTH ONCE EVERY 6 HOURS AS NEEDED FOR MUSCLE SPASMS. 120 tablet 0   traZODone (DESYREL) 150 MG tablet Take 1 tablet (150 mg total) by mouth at bedtime. 30 tablet 2   UNABLE TO FIND Med Name: Hand remote to lift chair. 1 Product 0   Vitamin D, Ergocalciferol, (DRISDOL) 1.25 MG (50000 UNIT) CAPS capsule Take 1 capsule (50,000 Units total) by mouth every 7 (seven) days. 6 capsule 0   No current facility-administered medications for this visit.     Musculoskeletal: Strength & Muscle Tone: na Gait & Station: na Patient leans: N/A  Psychiatric Specialty Exam: Review of Systems  Musculoskeletal:  Positive for arthralgias, gait problem and joint swelling.  All other systems reviewed and are negative.   There were no vitals taken for this visit.There is no height or weight on file to calculate BMI.  General Appearance: NA  Eye Contact:  NA  Speech:  Clear and Coherent  Volume:  Normal  Mood:  Euthymic  Affect:  NA  Thought Process:  Goal Directed  Orientation:  Full (Time, Place, and Person)  Thought  Content: WDL   Suicidal Thoughts:  No  Homicidal Thoughts:  No  Memory:  Immediate;   Good Recent;   Good Remote;   Good  Judgement:  Good  Insight:  Fair  Psychomotor Activity:  Decreased  Concentration:  Concentration: Good and Attention Span: Good  Recall:  Good  Fund of Knowledge: Good  Language: Good  Akathisia:  No  Handed:  Right  AIMS (if indicated): not done  Assets:  Communication Skills Desire for Improvement Resilience Social Support Talents/Skills  ADL's:  Intact  Cognition: WNL  Sleep:  Good   Screenings: PHQ2-9    Flowsheet Row Video Visit from 01/10/2022 in [facility] Video Visit from 10/11/2021 in [facility] Video Visit from 06/24/2021 in [facility] Video Visit from 03/26/2021 in [facility] Video Visit from 12/23/2020 in [facility]  PHQ-2 Total Score 1 1 0 0 3  PHQ-9 Total Score -- -- -- -- 7      Flowsheet Row Video Visit from 01/10/2022 in [facility] Video Visit from 10/11/2021 in [facility] Video Visit from 06/24/2021 in [place] No Risk No Risk No Risk        Assessment and Plan: This patient is a 61 year old female with a history of PTSD and anxiety.  She can continues to do well on her current regimen.  She will continue trazodone 150 mg at bedtime for sleep, Lexapro 30 mg daily for depression, Lamictal 100 mg twice daily for mood stabilization and Xanax 1 mg 4 times daily for anxiety.  She will return to see me in 3 months  Collaboration of Care: Collaboration of Care: Primary Care Provider AEB notes are shared with PCP on the epic system  Patient/Guardian was advised Release of Information  must be obtained prior to any record release in order to collaborate their care with an outside provider. Patient/Guardian was advised if they have not already done so to contact the registration department to sign all necessary forms in order for [place] to release information regarding their care.   Consent: Patient/Guardian gives verbal consent for treatment and assignment of benefits for services provided during this visit. Patient/Guardian expressed understanding and agreed to proceed.    [name], MD 01/10/2022, 10:09 AM

## 2022-01-17 ENCOUNTER — Other Ambulatory Visit: Payer: Self-pay | Admitting: Nurse Practitioner

## 2022-01-17 ENCOUNTER — Other Ambulatory Visit: Payer: Self-pay | Admitting: Family Medicine

## 2022-01-17 DIAGNOSIS — G8929 Other chronic pain: Secondary | ICD-10-CM

## 2022-01-17 DIAGNOSIS — R062 Wheezing: Secondary | ICD-10-CM

## 2022-01-27 ENCOUNTER — Telehealth (HOSPITAL_COMMUNITY): Payer: Self-pay

## 2022-01-27 NOTE — Telephone Encounter (Signed)
Pt called in to inquire about a paper stating that her cats are her therapy cats for her home. Please advise.

## 2022-01-27 NOTE — Telephone Encounter (Signed)
She needs to ask her landlord what is required for this, sometimes they have a form to fill out

## 2022-01-27 NOTE — Telephone Encounter (Signed)
Spoke with pt she will reach out to her landlord to see exactly what they need and call us back

## 2022-01-31 ENCOUNTER — Other Ambulatory Visit: Payer: Self-pay | Admitting: Nurse Practitioner

## 2022-02-15 ENCOUNTER — Ambulatory Visit: Payer: Medicare Other | Admitting: Family Medicine

## 2022-02-17 ENCOUNTER — Other Ambulatory Visit: Payer: Self-pay | Admitting: Nurse Practitioner

## 2022-02-22 ENCOUNTER — Ambulatory Visit (INDEPENDENT_AMBULATORY_CARE_PROVIDER_SITE_OTHER): Payer: Medicare Other | Admitting: Family Medicine

## 2022-02-22 VITALS — BP 135/73 | Ht 64.0 in | Wt 260.4 lb

## 2022-02-22 DIAGNOSIS — R8290 Unspecified abnormal findings in urine: Secondary | ICD-10-CM

## 2022-02-22 DIAGNOSIS — R82998 Other abnormal findings in urine: Secondary | ICD-10-CM | POA: Diagnosis not present

## 2022-02-22 DIAGNOSIS — M797 Fibromyalgia: Secondary | ICD-10-CM | POA: Diagnosis not present

## 2022-02-22 LAB — POCT URINALYSIS DIPSTICK
Spec Grav, UA: 1.02 (ref 1.010–1.025)
pH, UA: 6 (ref 5.0–8.0)

## 2022-02-22 MED ORDER — GABAPENTIN 300 MG PO CAPS: 600.0000 mg | ORAL_CAPSULE | Freq: Three times a day (TID) | ORAL | 3 refills | Status: DC

## 2022-02-22 MED ORDER — TIZANIDINE HCL 4 MG PO TABS: 4.0000 mg | ORAL_TABLET | Freq: Three times a day (TID) | ORAL | 0 refills | Status: DC | PRN

## 2022-02-22 NOTE — Assessment & Plan Note (Signed)
Patient's Zanaflex and gabapentin refilled today at her request.

## 2022-02-22 NOTE — Progress Notes (Signed)
Subjective:  Patient ID: Annette Galvan, female    DOB: 26-Dec-1960  Age: 61 y.o. MRN: [id number]  CC: Chief Complaint  Patient presents with   urine cloudy and dark     Patient arrives to discuss dark and cloudy urine since she stopped drinking water. Patient stated she drinks mountain dew and sugar lemonade    HPI:  61 year old female presents for evaluation above.  Patient reports that she has recently decreased her water intake.  She is not exactly sure why.  She states that her urine has been darker and has had an odor to it.  Denies any urinary frequency.  Denies dysuria.  No abdominal pain.  Patient is concerned that she may have a UTI.  No relieving factors.   Additionally, patient suffers from fibromyalgia.  She also has a diagnosis of neuropathy.  Uses gabapentin.  Requesting refill.  Also would like a refill on her muscle relaxer.  Patient Active Problem List   Diagnosis Date Noted   Malodorous urine 02/22/2022   Vitamin D deficiency 05/27/2021   Hyperlipidemia 05/27/2021   Seasonal allergies 05/25/2021   Chronic left shoulder pain 10/20/2020   Neuropathy 10/20/2020   Class 3 obesity ([place]) 12/02/2019   Polypharmacy 11/13/2015   Subclinical hyperthyroidism 08/14/2015   GERD (gastroesophageal reflux disease) 11/12/2014   OA (osteoarthritis) of knee 05/17/2013   Sleep apnea 09/13/2012   Tobacco use 09/13/2012   Hypertension 02/10/2012   Fibromyalgia 10/11/2011   Morbid obesity ([place]) 08/29/2011   Prediabetes 08/29/2011   Bipolar 1 disorder ([place]) 08/29/2011   DDD (degenerative disc disease), lumbar 04/04/2007    Social Hx   Social History   Socioeconomic History   Marital status: Widowed    Spouse name: Not on file   Number of children: Not on file   Years of education: Not on file   Highest education level: Not on file  Occupational History   Occupation: Disabled  Tobacco Use   Smoking status: Light Smoker    Packs/day: 0.25    Years: 25.00    Total pack  years: 6.25    Types: Cigarettes   Smokeless tobacco: Never   Tobacco comments:    2 cigarettes daily  Vaping Use   Vaping Use: Never used  Substance and Sexual Activity   Alcohol use: Yes    Alcohol/week: 0.0 standard drinks of alcohol    Comment: occasional   Drug use: No    Comment: cocaine, clean for 4.5 years as of 12/04/2015   Sexual activity: Yes    Birth control/protection: Surgical    Comment: hysterectomy  Other Topics Concern   Not on file  Social History Narrative   Not on file   Social Determinants of Health   Financial Resource Strain: Low Risk  (08/20/2020)   Overall Financial Resource Strain (CARDIA)    Difficulty of Paying Living Expenses: Not hard at all  Food Insecurity: No Food Insecurity (08/20/2020)   Hunger Vital Sign    Worried About Running Out of Food in the Last Year: Never true    Ran Out of Food in the Last Year: Never true  Transportation Needs: No Transportation Needs (08/20/2020)   PRAPARE - Hydrologist (Medical): No    Lack of Transportation (Non-Medical): No  Physical Activity: Inactive (08/20/2020)   Exercise Vital Sign    Days of Exercise per Week: 0 days    Minutes of Exercise per Session: 0 min  Stress: No Stress  Concern Present (08/20/2020)   [employer]    Feeling of Stress : Not at all  Social Connections: Socially Isolated (08/20/2020)   Social Connection and Isolation Panel [NHANES]    Frequency of Communication with Friends and Family: More than three times a week    Frequency of Social Gatherings with Friends and Family: Once a week    Attends Religious Services: Never    Marine scientist or Organizations: No    Attends Archivist Meetings: Never    Marital Status: Widowed    Review of Systems Per HPI  Objective:  BP 135/73   Ht '5\' 4"'$  (1.626 m)   Wt 260 lb 6.4 oz (118.1 kg)   BMI 44.70 kg/m      02/22/2022    8:24  AM 11/03/2021    3:20 PM 10/25/2021    2:31 PM  BP/Weight  Systolic BP 381 017 510  Diastolic BP 73 74 60  Wt. (Lbs) 260.4 278.6 278.6  BMI 44.7 kg/m2 47.82 kg/m2 47.82 kg/m2    Physical Exam Vitals and nursing note reviewed.  Constitutional:      General: She is not in acute distress.    Appearance: Normal appearance. She is obese.  HENT:     Head: Normocephalic and atraumatic.  Eyes:     General:        Right eye: No discharge.        Left eye: No discharge.     Conjunctiva/sclera: Conjunctivae normal.  Cardiovascular:     Rate and Rhythm: Normal rate and regular rhythm.  Pulmonary:     Effort: Pulmonary effort is normal.     Breath sounds: Normal breath sounds. No wheezing, rhonchi or rales.  Abdominal:     General: There is no distension.     Palpations: Abdomen is soft.     Tenderness: There is no abdominal tenderness.  Neurological:     Mental Status: She is alert.  Psychiatric:        Mood and Affect: Mood normal.        Behavior: Behavior normal.     Lab Results  Component Value Date   WBC 6.3 10/25/2021   HGB 12.4 10/25/2021   HCT 38.5 10/25/2021   PLT 262 10/25/2021   GLUCOSE 99 10/25/2021   CHOL 150 10/25/2021   TRIG 129 10/25/2021   HDL 53 10/25/2021   LDLCALC 74 10/25/2021   ALT 11 10/25/2021   AST 13 10/25/2021   NA 141 10/25/2021   K 4.1 10/25/2021   CL 102 10/25/2021   CREATININE 0.86 10/25/2021   BUN 21 10/25/2021   CO2 22 10/25/2021   TSH 0.520 05/25/2021   HGBA1C 5.5 10/25/2021     Assessment & Plan:   Problem List Items Addressed This Visit       Other   Fibromyalgia    Patient's Zanaflex and gabapentin refilled today at her request.      Relevant Medications   gabapentin (NEURONTIN) 300 MG capsule   tiZANidine (ZANAFLEX) 4 MG tablet   Malodorous urine - Primary    UA was clear today.  Advised to increase her fluid intake per tickly her water intake.  Sending culture.      Relevant Orders   POCT urinalysis dipstick  (Completed)   Urine Culture    Meds ordered this encounter  Medications   gabapentin (NEURONTIN) 300 MG capsule    Sig: Take 2 capsules (  600 mg total) by mouth 3 (three) times daily.    Dispense:  180 capsule    Refill:  3   tiZANidine (ZANAFLEX) 4 MG tablet    Sig: Take 1 tablet (4 mg total) by mouth every 8 (eight) hours as needed for muscle spasms.    Dispense:  60 tablet    Refill:  [place]

## 2022-02-22 NOTE — Patient Instructions (Signed)
Drink more water.  I have refilled your medications.  Take care  Dr. Lacinda Axon

## 2022-02-22 NOTE — Assessment & Plan Note (Signed)
UA was clear today.  Advised to increase her fluid intake per tickly her water intake.  Sending culture.

## 2022-02-25 LAB — URINE CULTURE

## 2022-02-26 ENCOUNTER — Other Ambulatory Visit: Payer: Self-pay | Admitting: Family Medicine

## 2022-02-26 MED ORDER — NITROFURANTOIN MONOHYD MACRO 100 MG PO CAPS: 100.0000 mg | ORAL_CAPSULE | Freq: Two times a day (BID) | ORAL | 0 refills | Status: DC

## 2022-03-03 ENCOUNTER — Other Ambulatory Visit: Payer: Self-pay | Admitting: Family Medicine

## 2022-03-03 ENCOUNTER — Other Ambulatory Visit: Payer: Self-pay | Admitting: Nurse Practitioner

## 2022-03-03 MED ORDER — NYSTATIN 100000 UNIT/ML MT SUSP: 5.0000 mL | Freq: Four times a day (QID) | OROMUCOSAL | 0 refills | Status: DC

## 2022-03-21 ENCOUNTER — Other Ambulatory Visit: Payer: Self-pay | Admitting: Family Medicine

## 2022-03-21 DIAGNOSIS — M797 Fibromyalgia: Secondary | ICD-10-CM

## 2022-03-22 ENCOUNTER — Other Ambulatory Visit: Payer: Self-pay | Admitting: Nurse Practitioner

## 2022-03-22 DIAGNOSIS — R062 Wheezing: Secondary | ICD-10-CM

## 2022-04-01 ENCOUNTER — Telehealth (INDEPENDENT_AMBULATORY_CARE_PROVIDER_SITE_OTHER): Payer: Medicare Other | Admitting: Psychiatry

## 2022-04-01 ENCOUNTER — Encounter (HOSPITAL_COMMUNITY): Payer: Self-pay | Admitting: Psychiatry

## 2022-04-01 DIAGNOSIS — F4310 Post-traumatic stress disorder, unspecified: Secondary | ICD-10-CM

## 2022-04-01 DIAGNOSIS — F319 Bipolar disorder, unspecified: Secondary | ICD-10-CM | POA: Diagnosis not present

## 2022-04-01 MED ORDER — ALPRAZOLAM 1 MG PO TABS: ORAL_TABLET | ORAL | 3 refills | Status: DC

## 2022-04-01 MED ORDER — TRAZODONE HCL 150 MG PO TABS: 300.0000 mg | ORAL_TABLET | Freq: Every day | ORAL | 2 refills | Status: DC

## 2022-04-01 MED ORDER — ESCITALOPRAM OXALATE 20 MG PO TABS: 30.0000 mg | ORAL_TABLET | Freq: Every day | ORAL | 2 refills | Status: DC

## 2022-04-01 MED ORDER — LAMOTRIGINE 100 MG PO TABS: 100.0000 mg | ORAL_TABLET | Freq: Two times a day (BID) | ORAL | 3 refills | Status: DC

## 2022-04-01 NOTE — Progress Notes (Signed)
Virtual Visit via Telephone Note  I connected with Annette Galvan on 04/01/22 at  9:40 AM EST by telephone and verified that I am speaking with the correct person using two identifiers.  Location: Patient: home Provider: office   I discussed the limitations, risks, security and privacy concerns of performing an evaluation and management service by telephone and the availability of in person appointments. I also discussed with the patient that there may be a patient responsible charge related to this service. The patient expressed understanding and agreed to proceed.       I discussed the assessment and treatment plan with the patient. The patient was provided an opportunity to ask questions and all were answered. The patient agreed with the plan and demonstrated an understanding of the instructions.   The patient was advised to call back or seek an in-person evaluation if the symptoms worsen or if the condition fails to improve as anticipated.  I provided 15 minutes of non-face-to-face time during this encounter.   [name], MD  [facility] MD/PA/NP OP Progress Note  04/01/2022 9:45 AM Annette Galvan  MRN:  [id number]  Chief Complaint:  Chief Complaint  Patient presents with   Anxiety   Depression   Follow-up   HPI: This patient is a 61 year old widowed white female who lives alone in [place].  She has 2 grown daughters and 3 grandchildren.  She is on disability  The patient returns after 3 months regarding her depression and anxiety.  She is having trouble with her home health aide whom she claims is not doing her duties.  I urged her to talk to the aide about this first and if no result to go to the agency.  She voices understanding.  Overall her mood is stable but she is not sleeping well and would like to increase the trazodone.  She is still having a lot of leg pain and neuropathy.  She denies any thoughts of suicide or self-harm Visit Diagnosis:    ICD-10-CM   1. Bipolar 1  disorder (HCC)  F31.9     2. PTSD (post-traumatic stress disorder)  F43.10       Past Psychiatric History: History of depression anxiety and a remote history of substance abuse.  Admissions in her 67s and 26s  Past Medical History:  Past Medical History:  Diagnosis Date   Arthritis    needs 2 knee replacements; also has left shoulder and left hip pain   Back pain, chronic    Bilateral chronic knee pain    Bipolar 1 disorder (HCC)    Bulging disc    Fibromyalgia    GERD (gastroesophageal reflux disease)    HSV-2 (herpes simplex virus 2) infection 2013   Hx of trichomoniasis    Itching 06/26/2013   Leg pain    Trichimoniasis 06/26/2013   UTI (lower urinary tract infection) 10/12/2012   Vaginal discharge 06/26/2013   Vaginal irritation 12/05/2013   Vaginal odor 12/28/2012   Had discharge +clue   Yeast infection 02/04/2013    Past Surgical History:  Procedure Laterality Date   ABDOMINAL HYSTERECTOMY     total   BREAST ENHANCEMENT SURGERY Right    COLONOSCOPY WITH PROPOFOL N/A 12/07/2015   Procedure: COLONOSCOPY WITH PROPOFOL;  Surgeon: Daneil Dolin, MD;  [facility]: [facility];  Service: Endoscopy;  Laterality: N/A;  38   TUBAL LIGATION      Family Psychiatric History: See below  Family History:  Family History  Problem Relation  Age of Onset   Hypertension Mother    Diabetes Mother    Depression Mother    Hyperlipidemia Mother    Fibromyalgia Mother    Arthritis Mother    Cancer Father        bone   Hyperlipidemia Father    Hypertension Father    Bipolar disorder Father    Depression Sister    Hyperlipidemia Sister    Fibromyalgia Sister    Fibromyalgia Sister    Bipolar disorder Other    Drug abuse Other    Alcohol abuse Other    Hypertension Brother    Other Brother        shingles; back problems   Diabetes Paternal Grandmother    Alzheimer's disease Maternal Grandmother    Obesity Daughter    Other Daughter        back problems   Bipolar disorder  Daughter    Depression Daughter    Other Daughter        on pain meds   Colon cancer Neg Hx     Social History:  Social History   Socioeconomic History   Marital status: Widowed    Spouse name: Not on file   Number of children: Not on file   Years of education: Not on file   Highest education level: Not on file  Occupational History   Occupation: Disabled  Tobacco Use   Smoking status: Light Smoker    Packs/day: 0.25    Years: 25.00    Total pack years: 6.25    Types: Cigarettes   Smokeless tobacco: Never   Tobacco comments:    2 cigarettes daily  Vaping Use   Vaping Use: Never used  Substance and Sexual Activity   Alcohol use: Yes    Alcohol/week: 0.0 standard drinks of alcohol    Comment: occasional   Drug use: No    Comment: cocaine, clean for 4.5 years as of 12/04/2015   Sexual activity: Yes    Birth control/protection: Surgical    Comment: hysterectomy  Other Topics Concern   Not on file  Social History Narrative   Not on file   Social Determinants of Health   Financial Resource Strain: Low Risk  (08/20/2020)   Overall Financial Resource Strain (CARDIA)    Difficulty of Paying Living Expenses: Not hard at all  Food Insecurity: No Food Insecurity (08/20/2020)   Hunger Vital Sign    Worried About Running Out of Food in the Last Year: Never true    Ran Out of Food in the Last Year: Never true  Transportation Needs: No Transportation Needs (08/20/2020)   PRAPARE - Hydrologist (Medical): No    Lack of Transportation (Non-Medical): No  Physical Activity: Inactive (08/20/2020)   Exercise Vital Sign    Days of Exercise per Week: 0 days    Minutes of Exercise per Session: 0 min  Stress: No Stress Concern Present (08/20/2020)   [place]    Feeling of Stress : Not at all  Social Connections: Socially Isolated (08/20/2020)   Social Connection and Isolation Panel [NHANES]     Frequency of Communication with Friends and Family: More than three times a week    Frequency of Social Gatherings with Friends and Family: Once a week    Attends Religious Services: Never    Marine scientist or Organizations: No    Attends Archivist Meetings: Never  Marital Status: Widowed    Allergies:  Allergies  Allergen Reactions   Amoxicillin Hives   Hydrocodone Nausea And Vomiting   Latex Swelling    Ankle Area   Lisinopril Cough   Ampicillin Hives and Nausea And Vomiting    Metabolic Disorder Labs: Lab Results  Component Value Date   HGBA1C 5.5 10/25/2021   MPG 117 05/18/2020   MPG 108 12/02/2019   No results found for: "PROLACTIN" Lab Results  Component Value Date   CHOL 150 10/25/2021   TRIG 129 10/25/2021   HDL 53 10/25/2021   CHOLHDL 2.8 10/25/2021   VLDL 23 08/12/2016   LDLCALC 74 10/25/2021   LDLCALC 68 07/29/2021   Lab Results  Component Value Date   TSH 0.520 05/25/2021   TSH 0.401 (L) 10/20/2020    Therapeutic Level Labs: No results found for: "LITHIUM" No results found for: "VALPROATE" No results found for: "CBMZ"  Current Medications: Current Outpatient Medications  Medication Sig Dispense Refill   nystatin (MYCOSTATIN) 100000 UNIT/ML suspension Take 5 mLs (500,000 Units total) by mouth 4 (four) times daily. 140 mL 0   acetaminophen (TYLENOL) 500 MG tablet Take 1 tablet (500 mg total) by mouth every 6 (six) hours as needed. 30 tablet 0   albuterol (VENTOLIN HFA) 108 (90 Base) MCG/ACT inhaler INHALE 2 PUFFS EVERY 6 HOURS AS NEEDED. 8.5 g 0   ALPRAZolam (XANAX) 1 MG tablet TAKE (1) TABLET BY MOUTH (4) TIMES DAILY AS NEEDED FOR ANXIETY. 120 tablet 3   cetirizine (ZYRTEC) 10 MG tablet Take 1 tablet (10 mg total) by mouth daily. 30 tablet 11   chlorhexidine (PERIDEX) 0.12 % solution SMARTSIG:By Mouth     diclofenac Sodium (VOLTAREN) 1 % GEL APPLY 2 GRAMS TO THE AFFECTED AREAS FOUR TIMES A DAY. 200 g 0   escitalopram (LEXAPRO)  20 MG tablet Take 1.5 tablets (30 mg total) by mouth daily. 45 tablet 2   gabapentin (NEURONTIN) 300 MG capsule Take 2 capsules (600 mg total) by mouth 3 (three) times daily. 180 capsule 3   ibuprofen (ADVIL) 800 MG tablet TAKE 1 TABLET BY MOUTH EVERY 8 HOURS AS NEEDED FOR MODERATE PAIN. TAKE WITH FOOD. 90 tablet 0   lamoTRIgine (LAMICTAL) 100 MG tablet Take 1 tablet (100 mg total) by mouth 2 (two) times daily. 60 tablet 3   losartan (COZAAR) 50 MG tablet Take 1 tablet (50 mg total) by mouth daily. 90 tablet 1   metFORMIN (GLUCOPHAGE) 500 MG tablet Take 1 tablet (500 mg total) by mouth 2 (two) times daily with a meal. 180 tablet 3   mometasone (NASONEX) 50 MCG/ACT nasal spray Place 2 sprays into the nose daily. 1 each 2   nitrofurantoin, macrocrystal-monohydrate, (MACROBID) 100 MG capsule Take 1 capsule (100 mg total) by mouth 2 (two) times daily. 14 capsule 0   nystatin (MYCOSTATIN/NYSTOP) powder APPLY TO THE AFFECTED AREA 3 TIMES DAILY AS DIRECTED. 15 g 0   omeprazole (PRILOSEC) 40 MG capsule TAKE (1) CAPSULE BY MOUTH ONCE DAILY FOR ACID REFLUX. 90 capsule 0   ondansetron (ZOFRAN) 4 MG tablet TAKE (1) TABLET BY MOUTH EVERY EIGHT HOURS AS NEEDED. 20 tablet 0   rosuvastatin (CRESTOR) 5 MG tablet Take 1 tablet (5 mg total) by mouth daily. 90 tablet 3   tiZANidine (ZANAFLEX) 4 MG tablet TAKE 1 TABLET BY MOUTH ONCE EVERY 8 HOURS AS NEEDED FOR MUSCLE SPASMS. 60 tablet 0   traZODone (DESYREL) 150 MG tablet Take 2 tablets (300 mg total) by  mouth at bedtime. 60 tablet 2   UNABLE TO FIND Med Name: Hand remote to lift chair. 1 Product 0   Vitamin D, Ergocalciferol, (DRISDOL) 1.25 MG (50000 UNIT) CAPS capsule Take 1 capsule (50,000 Units total) by mouth every 7 (seven) days. 6 capsule 0   No current facility-administered medications for this visit.     Musculoskeletal: Strength & Muscle Tone: na Gait & Station: na Patient leans: N/A  Psychiatric Specialty Exam: Review of Systems  Musculoskeletal:   Positive for arthralgias and gait problem.  Psychiatric/Behavioral:  Positive for sleep disturbance.   All other systems reviewed and are negative.   There were no vitals taken for this visit.There is no height or weight on file to calculate BMI.  General Appearance: NA  Eye Contact:  NA  Speech:  Clear and Coherent  Volume:  Normal  Mood:  Anxious and Euthymic  Affect:  Congruent  Thought Process:  Goal Directed  Orientation:  Full (Time, Place, and Person)  Thought Content: WDL   Suicidal Thoughts:  No  Homicidal Thoughts:  No  Memory:  Immediate;   Good Recent;   Good Remote;   NA  Judgement:  Good  Insight:  Fair  Psychomotor Activity:  Decreased  Concentration:  Concentration: Good and Attention Span: Good  Recall:  Good  Fund of Knowledge: Good  Language: Good  Akathisia:  No  Handed:  Right  AIMS (if indicated): not done  Assets:  Communication Skills Desire for Improvement Resilience Social Support Talents/Skills  ADL's:  Intact  Cognition: WNL  Sleep:  Fair   Screenings: PHQ2-9    Flowsheet Row Video Visit from 01/10/2022 in [facility] Video Visit from 10/11/2021 in [place] [facility] Video Visit from 06/24/2021 in [place] [facility] Video Visit from 03/26/2021 in [place] [facility] Video Visit from 12/23/2020 in [place] [place] [facility]  PHQ-2 Total Score 1 1 0 0 3  PHQ-9 Total Score -- -- -- -- 7      Flowsheet Row Video Visit from 01/10/2022 in [place] [facility] Video Visit from 10/11/2021 in [place] [facility] [facility] Video Visit from 06/24/2021 in [place] No Risk No Risk No Risk        Assessment and Plan: This patient is a 61 year old female with a  history of PTSD and anxiety.  She is having some struggles with her aide but otherwise is doing well except for sleep.  Will increase trazodone to 300 mg at bedtime for sleep, continue Lexapro 30 mg daily for depression, Lamictal 100 mg twice daily for mood stabilization and Xanax 1 mg 4 times daily for anxiety.  She will return to see me in 3 months  Collaboration of Care: Collaboration of Care: Primary Care Provider AEB notes will be shared with PCP on the epic system  Patient/Guardian was advised Release of Information must be obtained prior to any record release in order to collaborate their care with an outside provider. Patient/Guardian was advised if they have not already done so to contact the registration department to sign all necessary forms in order for [place] to release information regarding their care.   Consent: Patient/Guardian gives verbal consent for treatment and assignment of benefits for services provided during this visit. Patient/Guardian expressed understanding and agreed to proceed.    [name], MD 04/01/2022, 9:45 AM

## 2022-04-05 ENCOUNTER — Other Ambulatory Visit: Payer: Self-pay | Admitting: Family Medicine

## 2022-04-05 DIAGNOSIS — M797 Fibromyalgia: Secondary | ICD-10-CM

## 2022-04-08 ENCOUNTER — Other Ambulatory Visit: Payer: Self-pay | Admitting: Family Medicine

## 2022-04-21 ENCOUNTER — Telehealth: Payer: Self-pay | Admitting: Family Medicine

## 2022-04-21 NOTE — Telephone Encounter (Signed)
Patient states her [facility] nurse told she could get one if she had a script and she has bone on bone on her knees and with her weight she can not get off the furniture and she just needs a script and CAP will get it for her

## 2022-04-21 NOTE — Telephone Encounter (Signed)
Patient  is requesting a written  prescription for lift chair. She will pick up when ready

## 2022-04-21 NOTE — Telephone Encounter (Signed)
Thersa Salt G, DO     I have only seen this patient once. Please inquire about the reason for need for the lift chair.

## 2022-04-22 NOTE — Telephone Encounter (Signed)
Script up front for pick up. Patient notified.

## 2022-04-28 ENCOUNTER — Other Ambulatory Visit: Payer: Self-pay | Admitting: Family Medicine

## 2022-04-28 DIAGNOSIS — M797 Fibromyalgia: Secondary | ICD-10-CM

## 2022-04-28 DIAGNOSIS — R062 Wheezing: Secondary | ICD-10-CM

## 2022-05-05 ENCOUNTER — Encounter: Payer: Self-pay | Admitting: Family Medicine

## 2022-05-05 ENCOUNTER — Ambulatory Visit (INDEPENDENT_AMBULATORY_CARE_PROVIDER_SITE_OTHER): Payer: Medicare Other | Admitting: Family Medicine

## 2022-05-05 DIAGNOSIS — E785 Hyperlipidemia, unspecified: Secondary | ICD-10-CM

## 2022-05-05 DIAGNOSIS — R110 Nausea: Secondary | ICD-10-CM | POA: Diagnosis not present

## 2022-05-05 DIAGNOSIS — E0590 Thyrotoxicosis, unspecified without thyrotoxic crisis or storm: Secondary | ICD-10-CM

## 2022-05-05 DIAGNOSIS — R5383 Other fatigue: Secondary | ICD-10-CM

## 2022-05-05 DIAGNOSIS — I10 Essential (primary) hypertension: Secondary | ICD-10-CM

## 2022-05-05 DIAGNOSIS — E559 Vitamin D deficiency, unspecified: Secondary | ICD-10-CM

## 2022-05-05 DIAGNOSIS — M170 Bilateral primary osteoarthritis of knee: Secondary | ICD-10-CM

## 2022-05-05 DIAGNOSIS — G629 Polyneuropathy, unspecified: Secondary | ICD-10-CM

## 2022-05-05 DIAGNOSIS — R7303 Prediabetes: Secondary | ICD-10-CM | POA: Diagnosis not present

## 2022-05-05 MED ORDER — ONDANSETRON HCL 4 MG PO TABS: ORAL_TABLET | ORAL | 0 refills | Status: DC

## 2022-05-05 MED ORDER — ETODOLAC 400 MG PO TABS: 400.0000 mg | ORAL_TABLET | Freq: Two times a day (BID) | ORAL | 3 refills | Status: DC | PRN

## 2022-05-05 NOTE — Patient Instructions (Signed)
Use the medication as prescribed for pain.  Consider seeing nutrition.  Follow up in 6 months.  Take care  Dr. Lacinda Axon

## 2022-05-06 DIAGNOSIS — R5383 Other fatigue: Secondary | ICD-10-CM | POA: Insufficient documentation

## 2022-05-06 NOTE — Assessment & Plan Note (Signed)
Etodolac for pain.

## 2022-05-06 NOTE — Assessment & Plan Note (Signed)
Recommended referral to nutrition.  Patient declined.  Discussed possible bariatric surgery. Patient advised that she needs to change her diet and her lifestyle to help lose weight.

## 2022-05-06 NOTE — Progress Notes (Signed)
Subjective:  Patient ID: Annette Galvan, female    DOB: Mar 01, 1961  Age: 62 y.o. MRN: [id number]  CC: Chief Complaint  Patient presents with   Fatigue    X 1 yr, request blood work for hiv, any vitamin deficiency   Obesity    Request for weight loss medications due to knee pain bilateral , very difficult/painful walking    HPI:  62 year old female with morbid obesity presents for evaluation of the above.  Patient reports that she needs to lose weight.  She states that her weight is starting to impact her ability to ambulate.  She has knee arthritis and has underwent injections.  She would like any knee replacement but she needs to lose weight for surgery.  Patient states that she feels like that if her pain was better controlled she would be able to be more active and help her lose weight.  Will discuss dietary changes and nutrition as well as bariatric surgery as options as well.  Patient also reports ongoing fatigue.  She is interested in getting labs done for further evaluation.  Patient Active Problem List   Diagnosis Date Noted   Other fatigue 05/06/2022   Vitamin D deficiency 05/27/2021   Hyperlipidemia 05/27/2021   Seasonal allergies 05/25/2021   Subclinical hyperthyroidism 08/14/2015   GERD (gastroesophageal reflux disease) 11/12/2014   OA (osteoarthritis) of knee 05/17/2013   Sleep apnea 09/13/2012   Tobacco use 09/13/2012   Hypertension 02/10/2012   Fibromyalgia 10/11/2011   Morbid obesity ([place]) 08/29/2011   Prediabetes 08/29/2011   Bipolar 1 disorder ([place]) 08/29/2011   DDD (degenerative disc disease), lumbar 04/04/2007    Social Hx   Social History   Socioeconomic History   Marital status: Widowed    Spouse name: Not on file   Number of children: Not on file   Years of education: Not on file   Highest education level: Not on file  Occupational History   Occupation: Disabled  Tobacco Use   Smoking status: Light Smoker    Packs/day: 0.25    Years: 25.00     Total pack years: 6.25    Types: Cigarettes   Smokeless tobacco: Never   Tobacco comments:    2 cigarettes daily  Vaping Use   Vaping Use: Never used  Substance and Sexual Activity   Alcohol use: Yes    Alcohol/week: 0.0 standard drinks of alcohol    Comment: occasional   Drug use: No    Comment: cocaine, clean for 4.5 years as of 12/04/2015   Sexual activity: Yes    Birth control/protection: Surgical    Comment: hysterectomy  Other Topics Concern   Not on file  Social History Narrative   Not on file   Social Determinants of Health   Financial Resource Strain: Low Risk  (08/20/2020)   Overall Financial Resource Strain (CARDIA)    Difficulty of Paying Living Expenses: Not hard at all  Food Insecurity: No Food Insecurity (08/20/2020)   Hunger Vital Sign    Worried About Running Out of Food in the Last Year: Never true    Ran Out of Food in the Last Year: Never true  Transportation Needs: No Transportation Needs (08/20/2020)   PRAPARE - Hydrologist (Medical): No    Lack of Transportation (Non-Medical): No  Physical Activity: Inactive (08/20/2020)   Exercise Vital Sign    Days of Exercise per Week: 0 days    Minutes of Exercise per Session: 0 min  Stress: No Stress Concern Present (08/20/2020)   [place]    Feeling of Stress : Not at all  Social Connections: Socially Isolated (08/20/2020)   Social Connection and Isolation Panel [NHANES]    Frequency of Communication with Friends and Family: More than three times a week    Frequency of Social Gatherings with Friends and Family: Once a week    Attends Religious Services: Never    Marine scientist or Organizations: No    Attends Archivist Meetings: Never    Marital Status: Widowed    Review of Systems Per HPI  Objective:  BP (!) 158/83   Pulse 64   Temp (!) 97.2 F (36.2 C)   Ht '5\' 4"'$  (1.626 m)   Wt 266 lb  (120.7 kg)   SpO2 96%   BMI 45.66 kg/m      05/05/2022    2:32 PM 02/22/2022    8:24 AM 11/03/2021    3:20 PM  BP/Weight  Systolic BP 836 629 476  Diastolic BP 83 73 74  Wt. (Lbs) 266 260.4 278.6  BMI 45.66 kg/m2 44.7 kg/m2 47.82 kg/m2    Physical Exam Vitals and nursing note reviewed.  Constitutional:      General: She is not in acute distress.    Appearance: Normal appearance.  HENT:     Head: Normocephalic and atraumatic.  Cardiovascular:     Rate and Rhythm: Normal rate and regular rhythm.  Pulmonary:     Effort: Pulmonary effort is normal.     Breath sounds: Normal breath sounds. No wheezing, rhonchi or rales.  Neurological:     Mental Status: She is alert.  Psychiatric:        Mood and Affect: Mood normal.        Behavior: Behavior normal.     Lab Results  Component Value Date   WBC 6.3 10/25/2021   HGB 12.4 10/25/2021   HCT 38.5 10/25/2021   PLT 262 10/25/2021   GLUCOSE 99 10/25/2021   CHOL 150 10/25/2021   TRIG 129 10/25/2021   HDL 53 10/25/2021   LDLCALC 74 10/25/2021   ALT 11 10/25/2021   AST 13 10/25/2021   NA 141 10/25/2021   K 4.1 10/25/2021   CL 102 10/25/2021   CREATININE 0.86 10/25/2021   BUN 21 10/25/2021   CO2 22 10/25/2021   TSH 0.520 05/25/2021   HGBA1C 5.5 10/25/2021     Assessment & Plan:   Problem List Items Addressed This Visit       Cardiovascular and Mediastinum   Hypertension   Relevant Orders   CMP14+EGFR     Endocrine   Subclinical hyperthyroidism   Relevant Orders   TSH     Musculoskeletal and Integument   OA (osteoarthritis) of knee    Etodolac for pain.      Relevant Medications   etodolac (LODINE) 400 MG tablet     Other   Hyperlipidemia   Relevant Orders   Lipid panel   Morbid obesity ([place]) - Primary    Recommended referral to nutrition.  Patient declined.  Discussed possible bariatric surgery. Patient advised that she needs to change her diet and her lifestyle to help lose weight.      Other  fatigue    Labs today for further evaluation.      Relevant Orders   CBC   Prediabetes   Relevant Orders   Hemoglobin A1c   Vitamin  D deficiency   Relevant Orders   Vitamin D, 25-hydroxy   Other Visit Diagnoses     Nausea       Relevant Medications   ondansetron (ZOFRAN) 4 MG tablet   Neuropathy       Relevant Orders   Vitamin B12       Meds ordered this encounter  Medications   ondansetron (ZOFRAN) 4 MG tablet    Sig: TAKE (1) TABLET BY MOUTH EVERY EIGHT HOURS AS NEEDED.    Dispense:  20 tablet    Refill:  0   etodolac (LODINE) 400 MG tablet    Sig: Take 1 tablet (400 mg total) by mouth 2 (two) times daily as needed for moderate pain.    Dispense:  60 tablet    Refill:  [place]

## 2022-05-06 NOTE — Assessment & Plan Note (Addendum)
Labs today for further evaluation.

## 2022-05-12 DIAGNOSIS — R5383 Other fatigue: Secondary | ICD-10-CM | POA: Diagnosis not present

## 2022-05-12 DIAGNOSIS — I10 Essential (primary) hypertension: Secondary | ICD-10-CM | POA: Diagnosis not present

## 2022-05-12 DIAGNOSIS — E559 Vitamin D deficiency, unspecified: Secondary | ICD-10-CM | POA: Diagnosis not present

## 2022-05-12 DIAGNOSIS — E0590 Thyrotoxicosis, unspecified without thyrotoxic crisis or storm: Secondary | ICD-10-CM | POA: Diagnosis not present

## 2022-05-12 DIAGNOSIS — E785 Hyperlipidemia, unspecified: Secondary | ICD-10-CM | POA: Diagnosis not present

## 2022-05-12 DIAGNOSIS — R7303 Prediabetes: Secondary | ICD-10-CM | POA: Diagnosis not present

## 2022-05-13 ENCOUNTER — Other Ambulatory Visit: Payer: Self-pay | Admitting: Family Medicine

## 2022-05-13 LAB — CMP14+EGFR
ALT: 12 IU/L (ref 0–32)
AST: 17 IU/L (ref 0–40)
Albumin/Globulin Ratio: 1.6 (ref 1.2–2.2)
Albumin: 4.6 g/dL (ref 3.9–4.9)
Alkaline Phosphatase: 85 IU/L (ref 44–121)
BUN/Creatinine Ratio: 18 (ref 12–28)
BUN: 15 mg/dL (ref 8–27)
Bilirubin Total: 0.3 mg/dL (ref 0.0–1.2)
CO2: 22 mmol/L (ref 20–29)
Calcium: 9.5 mg/dL (ref 8.7–10.3)
Chloride: 102 mmol/L (ref 96–106)
Creatinine, Ser: 0.82 mg/dL (ref 0.57–1.00)
Globulin, Total: 2.8 g/dL (ref 1.5–4.5)
Glucose: 107 mg/dL — ABNORMAL HIGH (ref 70–99)
Potassium: 4.3 mmol/L (ref 3.5–5.2)
Sodium: 141 mmol/L (ref 134–144)
Total Protein: 7.4 g/dL (ref 6.0–8.5)
eGFR: 81 mL/min/{1.73_m2} (ref >59)

## 2022-05-13 LAB — CBC
Hematocrit: 42.8 % (ref 34.0–46.6)
Hemoglobin: 13.9 g/dL (ref 11.1–15.9)
MCH: 29 pg (ref 26.6–33.0)
MCHC: 32.5 g/dL (ref 31.5–35.7)
MCV: 89 fL (ref 79–97)
Platelets: 330 10*3/uL (ref 150–450)
RBC: 4.79 x10E6/uL (ref 3.77–5.28)
RDW: 13.7 % (ref 11.7–15.4)
WBC: 7.6 10*3/uL (ref 3.4–10.8)

## 2022-05-13 LAB — VITAMIN B12: Vitamin B-12: 683 pg/mL (ref 232–1245)

## 2022-05-13 LAB — LIPID PANEL
Chol/HDL Ratio: 3 ratio (ref 0.0–4.4)
Cholesterol, Total: 178 mg/dL (ref 100–199)
HDL: 60 mg/dL (ref >39)
LDL Chol Calc (NIH): 93 mg/dL (ref 0–99)
Triglycerides: 147 mg/dL (ref 0–149)
VLDL Cholesterol Cal: 25 mg/dL (ref 5–40)

## 2022-05-13 LAB — HEMOGLOBIN A1C
Est. average glucose Bld gHb Est-mCnc: 117 mg/dL
Hgb A1c MFr Bld: 5.7 % — ABNORMAL HIGH (ref 4.8–5.6)

## 2022-05-13 LAB — VITAMIN D 25 HYDROXY (VIT D DEFICIENCY, FRACTURES): Vit D, 25-Hydroxy: 13.5 ng/mL — ABNORMAL LOW (ref 30.0–100.0)

## 2022-05-13 LAB — TSH: TSH: 0.601 u[IU]/mL (ref 0.450–4.500)

## 2022-05-13 MED ORDER — VITAMIN D (ERGOCALCIFEROL) 1.25 MG (50000 UNIT) PO CAPS: 50000.0000 [IU] | ORAL_CAPSULE | ORAL | 0 refills | Status: DC

## 2022-05-13 NOTE — Addendum Note (Signed)
Addended by: Dairl Ponder on: 05/13/2022 03:39 PM   Modules accepted: Orders

## 2022-05-27 ENCOUNTER — Other Ambulatory Visit: Payer: Self-pay | Admitting: Family Medicine

## 2022-06-07 ENCOUNTER — Other Ambulatory Visit: Payer: Self-pay | Admitting: Family Medicine

## 2022-06-07 ENCOUNTER — Telehealth: Payer: Self-pay

## 2022-06-07 DIAGNOSIS — M797 Fibromyalgia: Secondary | ICD-10-CM

## 2022-06-07 MED ORDER — TIZANIDINE HCL 4 MG PO TABS: ORAL_TABLET | ORAL | 3 refills | Status: DC

## 2022-06-07 MED ORDER — GABAPENTIN 300 MG PO CAPS: 600.0000 mg | ORAL_CAPSULE | Freq: Three times a day (TID) | ORAL | 11 refills | Status: DC

## 2022-06-07 NOTE — Telephone Encounter (Signed)
Medication Refill for gabapentin (NEURONTIN) 300 MG capsule , tiZANidine (ZANAFLEX) 4 MG tablet , Pt is out of medication and needs meds sent in please call pt when available [place]   [place]

## 2022-06-20 ENCOUNTER — Other Ambulatory Visit: Payer: Self-pay | Admitting: Family Medicine

## 2022-06-20 ENCOUNTER — Telehealth: Payer: Self-pay

## 2022-06-20 MED ORDER — IBUPROFEN 800 MG PO TABS: 800.0000 mg | ORAL_TABLET | Freq: Three times a day (TID) | ORAL | 3 refills | Status: DC | PRN

## 2022-06-20 NOTE — Telephone Encounter (Signed)
Patient advised per Dr Lacinda Axon: I sent it in. Please make sure patient is aware that she cannot use other NSAID's with the Ibuprofen.   Patient verbalized understanding.

## 2022-06-20 NOTE — Telephone Encounter (Signed)
Coral Spikes, DO   I sent it in. Please make sure patient is aware that she cannot use other NSAID's with the Ibuprofen.

## 2022-06-20 NOTE — Telephone Encounter (Signed)
Prescription Request  06/20/2022  LOV: Visit date not found  What is the name of the medication or equipment? IBU 800 MG tablet   Have you contacted your pharmacy to request a refill? Yes   Which pharmacy would you like this sent to?  [facility], [place] [address] Phone: [phone] Fax: [phone]    Patient notified that their request is being sent to the clinical staff for review and that they should receive a response within 2 business days.   Please advise at Mobile [phone]  Pt has arthritis and unable to get out the bed Dr Lacinda Axon took her off IBU 800 and increased muscle relaxer she said she can't do that it makes her sleepy all the time she said she still needs muscle relaxer's but to cut back and go back on IBU 800. She does that because she can not afford over the counter meds. She said it she can not get that back she needs something because she can't get around

## 2022-06-22 ENCOUNTER — Other Ambulatory Visit: Payer: Self-pay | Admitting: Family Medicine

## 2022-06-22 DIAGNOSIS — R062 Wheezing: Secondary | ICD-10-CM

## 2022-07-14 ENCOUNTER — Encounter (HOSPITAL_COMMUNITY): Payer: Self-pay | Admitting: Psychiatry

## 2022-07-14 ENCOUNTER — Telehealth (INDEPENDENT_AMBULATORY_CARE_PROVIDER_SITE_OTHER): Payer: 59 | Admitting: Psychiatry

## 2022-07-14 DIAGNOSIS — F4310 Post-traumatic stress disorder, unspecified: Secondary | ICD-10-CM

## 2022-07-14 DIAGNOSIS — F319 Bipolar disorder, unspecified: Secondary | ICD-10-CM

## 2022-07-14 MED ORDER — ESCITALOPRAM OXALATE 20 MG PO TABS: 30.0000 mg | ORAL_TABLET | Freq: Every day | ORAL | 2 refills | Status: DC

## 2022-07-14 MED ORDER — ALPRAZOLAM 1 MG PO TABS: ORAL_TABLET | ORAL | 3 refills | Status: DC

## 2022-07-14 MED ORDER — TRAZODONE HCL 150 MG PO TABS: 300.0000 mg | ORAL_TABLET | Freq: Every day | ORAL | 2 refills | Status: DC

## 2022-07-14 MED ORDER — LAMOTRIGINE 100 MG PO TABS: 100.0000 mg | ORAL_TABLET | Freq: Two times a day (BID) | ORAL | 3 refills | Status: DC

## 2022-07-14 NOTE — Progress Notes (Signed)
Virtual Visit via Video Note  I connected with [name] on 07/14/22 at 11:20 AM EDT by a video enabled telemedicine application and verified that I am speaking with the correct person using two identifiers.  Location: Patient: home Provider: office   I discussed the limitations of evaluation and management by telemedicine and the availability of in person appointments. The patient expressed understanding and agreed to proceed.     I discussed the assessment and treatment plan with the patient. The patient was provided an opportunity to ask questions and all were answered. The patient agreed with the plan and demonstrated an understanding of the instructions.   The patient was advised to call back or seek an in-person evaluation if the symptoms worsen or if the condition fails to improve as anticipated.  I provided 15 minutes of non-face-to-face time during this encounter.   [name], MD  [facility] MD/PA/NP OP Progress Note  07/14/2022 11:37 AM [name]  MRN:  [id number]  Chief Complaint:  Chief Complaint  Patient presents with   Depression   Anxiety   Follow-up   HPI: This patient is a 62 year old widowed white female who lives alone in [place]. She has 2 grown daughters and 3 grandchildren. She is on disability   The patient returns for follow-up after 3 months regarding her depression and anxiety.  Overall she states she is doing well.  However in February she was forced to move out with only 8 days notice because her landlady sold the house she was living in.  She is now renting a different house which she is not to find out as it is very cold.  She is hopefully going to find somewhere else to live.  Overall her mood is stable and she is sleeping well with the trazodone.  She continues to have leg pain which is relieved somewhat with ibuprofen.  She is trying to lose weight so that she can qualify for knee surgery.  She denies significant depression or anxiety thoughts  of self-harm or suicide Visit Diagnosis:    ICD-10-CM   1. Bipolar 1 disorder (HCC)  F31.9     2. PTSD (post-traumatic stress disorder)  F43.10       Past Psychiatric History: History of depression anxiety and a remote history of substance abuse.  Admissions in her 88s and 78s   Past Medical History:  Past Medical History:  Diagnosis Date   Arthritis    needs 2 knee replacements; also has left shoulder and left hip pain   Back pain, chronic    Bilateral chronic knee pain    Bipolar 1 disorder (HCC)    Bulging disc    Fibromyalgia    GERD (gastroesophageal reflux disease)    HSV-2 (herpes simplex virus 2) infection 2013   Hx of trichomoniasis    Itching 06/26/2013   Leg pain    Trichimoniasis 06/26/2013   UTI (lower urinary tract infection) 10/12/2012   Vaginal discharge 06/26/2013   Vaginal irritation 12/05/2013   Vaginal odor 12/28/2012   Had discharge +clue   Yeast infection 02/04/2013    Past Surgical History:  Procedure Laterality Date   ABDOMINAL HYSTERECTOMY     total   BREAST ENHANCEMENT SURGERY Right    COLONOSCOPY WITH PROPOFOL N/A 12/07/2015   Procedure: COLONOSCOPY WITH PROPOFOL;  Surgeon: Daneil Dolin, MD;  Location: [facility];  Service: Endoscopy;  Laterality: N/A;  [place]  History: See below  Family History:  Family History  Problem Relation Age of Onset   Hypertension Mother    Diabetes Mother    Depression Mother    Hyperlipidemia Mother    Fibromyalgia Mother    Arthritis Mother    Cancer Father        bone   Hyperlipidemia Father    Hypertension Father    Bipolar disorder Father    Depression Sister    Hyperlipidemia Sister    Fibromyalgia Sister    Fibromyalgia Sister    Bipolar disorder Other    Drug abuse Other    Alcohol abuse Other    Hypertension Brother    Other Brother        shingles; back problems   Diabetes Paternal Grandmother    Alzheimer's disease Maternal Grandmother    Obesity  Daughter    Other Daughter        back problems   Bipolar disorder Daughter    Depression Daughter    Other Daughter        on pain meds   Colon cancer Neg Hx     Social History:  Social History   Socioeconomic History   Marital status: Widowed    Spouse name: Not on file   Number of children: Not on file   Years of education: Not on file   Highest education level: Not on file  Occupational History   Occupation: Disabled  Tobacco Use   Smoking status: Light Smoker    Packs/day: 0.25    Years: 25.00    Additional pack years: 0.00    Total pack years: 6.25    Types: Cigarettes   Smokeless tobacco: Never   Tobacco comments:    2 cigarettes daily  Vaping Use   Vaping Use: Never used  Substance and Sexual Activity   Alcohol use: Yes    Alcohol/week: 0.0 standard drinks of alcohol    Comment: occasional   Drug use: No    Comment: cocaine, clean for 4.5 years as of 12/04/2015   Sexual activity: Yes    Birth control/protection: Surgical    Comment: hysterectomy  Other Topics Concern   Not on file  Social History Narrative   Not on file   Social Determinants of Health   Financial Resource Strain: Low Risk  (08/20/2020)   Overall Financial Resource Strain (CARDIA)    Difficulty of Paying Living Expenses: Not hard at all  Food Insecurity: No Food Insecurity (08/20/2020)   Hunger Vital Sign    Worried About Running Out of Food in the Last Year: Never true    Ran Out of Food in the Last Year: Never true  Transportation Needs: No Transportation Needs (08/20/2020)   PRAPARE - Hydrologist (Medical): No    Lack of Transportation (Non-Medical): No  Physical Activity: Inactive (08/20/2020)   Exercise Vital Sign    Days of Exercise per Week: 0 days    Minutes of Exercise per Session: 0 min  Stress: No Stress Concern Present (08/20/2020)   [place]    Feeling of Stress : Not at all   Social Connections: Socially Isolated (08/20/2020)   Social Connection and Isolation Panel [NHANES]    Frequency of Communication with Friends and Family: More than three times a week    Frequency of Social Gatherings with Friends and Family: Once a week    Attends Religious Services: Never  Active Member of Clubs or Organizations: No    Attends Archivist Meetings: Never    Marital Status: Widowed    Allergies:  Allergies  Allergen Reactions   Amoxicillin Hives   Hydrocodone Nausea And Vomiting   Latex Swelling    Ankle Area   Lisinopril Cough   Ampicillin Hives and Nausea And Vomiting    Metabolic Disorder Labs: Lab Results  Component Value Date   HGBA1C 5.7 (H) 05/12/2022   MPG 117 05/18/2020   MPG 108 12/02/2019   No results found for: "PROLACTIN" Lab Results  Component Value Date   CHOL 178 05/12/2022   TRIG 147 05/12/2022   HDL 60 05/12/2022   CHOLHDL 3.0 05/12/2022   VLDL 23 08/12/2016   LDLCALC 93 05/12/2022   LDLCALC 74 10/25/2021   Lab Results  Component Value Date   TSH 0.601 05/12/2022   TSH 0.520 05/25/2021    Therapeutic Level Labs: No results found for: "LITHIUM" No results found for: "VALPROATE" No results found for: "CBMZ"  Current Medications: Current Outpatient Medications  Medication Sig Dispense Refill   acetaminophen (TYLENOL) 500 MG tablet Take 1 tablet (500 mg total) by mouth every 6 (six) hours as needed. 30 tablet 0   albuterol (VENTOLIN HFA) 108 (90 Base) MCG/ACT inhaler INHALE 2 PUFFS EVERY 6 HOURS AS NEEDED. 8.5 g 1   ALPRAZolam (XANAX) 1 MG tablet TAKE (1) TABLET BY MOUTH (4) TIMES DAILY AS NEEDED FOR ANXIETY. 120 tablet 3   cetirizine (ZYRTEC) 10 MG tablet Take 1 tablet (10 mg total) by mouth daily. 30 tablet 11   chlorhexidine (PERIDEX) 0.12 % solution SMARTSIG:By Mouth     diclofenac Sodium (VOLTAREN) 1 % GEL APPLY 2 GRAMS TO THE AFFECTED AREAS FOUR TIMES A DAY. 200 g 0   escitalopram (LEXAPRO) 20 MG tablet Take  1.5 tablets (30 mg total) by mouth daily. 45 tablet 2   gabapentin (NEURONTIN) 300 MG capsule Take 2 capsules (600 mg total) by mouth 3 (three) times daily. 180 capsule 11   ibuprofen (ADVIL) 800 MG tablet Take 1 tablet (800 mg total) by mouth every 8 (eight) hours as needed for mild pain or moderate pain. 30 tablet 3   lamoTRIgine (LAMICTAL) 100 MG tablet Take 1 tablet (100 mg total) by mouth 2 (two) times daily. 60 tablet 3   losartan (COZAAR) 50 MG tablet Take 1 tablet (50 mg total) by mouth daily. 90 tablet 1   metFORMIN (GLUCOPHAGE) 500 MG tablet Take 1 tablet (500 mg total) by mouth 2 (two) times daily with a meal. 180 tablet 3   mometasone (NASONEX) 50 MCG/ACT nasal spray Place 2 sprays into the nose daily. 1 each 2   omeprazole (PRILOSEC) 40 MG capsule TAKE (1) CAPSULE BY MOUTH ONCE DAILY FOR ACID REFLUX. 90 capsule 0   ondansetron (ZOFRAN) 4 MG tablet TAKE (1) TABLET BY MOUTH EVERY EIGHT HOURS AS NEEDED. 20 tablet 0   rosuvastatin (CRESTOR) 5 MG tablet Take 1 tablet (5 mg total) by mouth daily. 90 tablet 3   tiZANidine (ZANAFLEX) 4 MG tablet TAKE 1 TABLET BY MOUTH ONCE EVERY 8 HOURS AS NEEDED FOR MUSCLE SPASMS. 90 tablet 3   traZODone (DESYREL) 150 MG tablet Take 2 tablets (300 mg total) by mouth at bedtime. 60 tablet 2   Vitamin D, Ergocalciferol, (DRISDOL) 1.25 MG (50000 UNIT) CAPS capsule Take 1 capsule (50,000 Units total) by mouth every 7 (seven) days. 12 capsule 0   No current facility-administered medications for  this visit.     Musculoskeletal: Strength & Muscle Tone: within normal limits Gait & Station: normal Patient leans: N/A  Psychiatric Specialty Exam: Review of Systems  Musculoskeletal:  Positive for arthralgias and joint swelling.  All other systems reviewed and are negative.   There were no vitals taken for this visit.There is no height or weight on file to calculate BMI.  General Appearance: Casual and Fairly Groomed  Eye Contact:  Good  Speech:  Clear and  Coherent  Volume:  Normal  Mood:  Anxious and Euthymic  Affect:  Congruent  Thought Process:  Goal Directed  Orientation:  Full (Time, Place, and Person)  Thought Content: Rumination   Suicidal Thoughts:  No  Homicidal Thoughts:  No  Memory:  Immediate;   Good Recent;   Good Remote;   NA  Judgement:  Good  Insight:  Fair  Psychomotor Activity:  Normal  Concentration:  Concentration: Good and Attention Span: Good  Recall:  Good  Fund of Knowledge: Good  Language: Good  Akathisia:  No  Handed:  Right  AIMS (if indicated): not done  Assets:  Communication Skills Desire for Improvement Resilience Social Support Talents/Skills  ADL's:  Intact  Cognition: WNL  Sleep:  Good   Screenings: PHQ2-9    Flowsheet Row Video Visit from 01/10/2022 in [place] at [place] Video Visit from 10/11/2021 in [place] at [place] Video Visit from 06/24/2021 in [place] at [place] Video Visit from 03/26/2021 in [place] at [place] Video Visit from 12/23/2020 in [place] at [facility] Total Score 1 1 0 0 3  PHQ-9 Total Score -- -- -- -- 7      Flowsheet Row Video Visit from 01/10/2022 in [place] at [place] Video Visit from 10/11/2021 in [place] at [place] Video Visit from 06/24/2021 in [place] at [place] No Risk No Risk No Risk        Assessment and Plan: This patient is a 62 year old female with a history of PTSD and anxiety.  She seems to be doing well on her current regimen.  She will continue trazodone 300 mg at bedtime for sleep, Lexapro 30 mg daily for depression Lamictal 100 mg twice daily for mood stabilization and Xanax 1 mg 4 times daily as needed for anxiety.  She was warned not to combine the  Xanax with muscle relaxers.  She will return to see me in 4 months  Collaboration of Care: Collaboration of Care: Primary Care Provider AEB notes are shared with PCP on the epic system  Patient/Guardian was advised Release of Information must be obtained prior to any record release in order to collaborate their care with an outside provider. Patient/Guardian was advised if they have not already done so to contact the registration department to sign all necessary forms in order for [place] to release information regarding their care.   Consent: Patient/Guardian gives verbal consent for treatment and assignment of benefits for services provided during this visit. Patient/Guardian expressed understanding and agreed to proceed.    [name], MD 07/14/2022, 11:37 AM

## 2022-07-25 ENCOUNTER — Other Ambulatory Visit: Payer: Self-pay | Admitting: Family Medicine

## 2022-09-01 ENCOUNTER — Other Ambulatory Visit: Payer: Self-pay | Admitting: Family Medicine

## 2022-09-01 DIAGNOSIS — M797 Fibromyalgia: Secondary | ICD-10-CM

## 2022-09-05 NOTE — Telephone Encounter (Signed)
Pt wants to go down to 60 instead of 90 on this medication she only needs one in the morning and one when she lays down at night

## 2022-09-09 ENCOUNTER — Other Ambulatory Visit: Payer: Self-pay | Admitting: Family Medicine

## 2022-09-09 DIAGNOSIS — R062 Wheezing: Secondary | ICD-10-CM

## 2022-09-14 ENCOUNTER — Other Ambulatory Visit: Payer: Self-pay | Admitting: Family Medicine

## 2022-09-23 ENCOUNTER — Ambulatory Visit (INDEPENDENT_AMBULATORY_CARE_PROVIDER_SITE_OTHER): Payer: 59 | Admitting: Family Medicine

## 2022-09-23 VITALS — BP 138/86 | HR 76 | Temp 98.0°F | Ht 64.0 in | Wt 246.0 lb

## 2022-09-23 DIAGNOSIS — N3281 Overactive bladder: Secondary | ICD-10-CM | POA: Diagnosis not present

## 2022-09-23 DIAGNOSIS — T22211A Burn of second degree of right forearm, initial encounter: Secondary | ICD-10-CM | POA: Diagnosis not present

## 2022-09-23 DIAGNOSIS — F319 Bipolar disorder, unspecified: Secondary | ICD-10-CM

## 2022-09-23 MED ORDER — SOLIFENACIN SUCCINATE 5 MG PO TABS
5.0000 mg | ORAL_TABLET | Freq: Every day | ORAL | 1 refills | Status: DC
Start: 2022-09-23 — End: 2024-02-27

## 2022-09-23 MED ORDER — SILVER SULFADIAZINE 1 % EX CREA: 1.0000 | TOPICAL_CREAM | Freq: Every day | CUTANEOUS | 0 refills | Status: DC

## 2022-09-23 NOTE — Patient Instructions (Addendum)
Call Dr. Tenny Craw. I believe your symptoms are from your depression.  Use the Silvadene for your burn. Keep it clean.  Medication as prescribed regarding your bladder.  Take care  Dr. Adriana Simas

## 2022-09-25 DIAGNOSIS — T22211A Burn of second degree of right forearm, initial encounter: Secondary | ICD-10-CM | POA: Insufficient documentation

## 2022-09-25 DIAGNOSIS — N3281 Overactive bladder: Secondary | ICD-10-CM | POA: Insufficient documentation

## 2022-09-25 NOTE — Assessment & Plan Note (Signed)
Patient experiencing worsening depression.  I advised her to contact her psychiatrist to discuss.

## 2022-09-25 NOTE — Assessment & Plan Note (Signed)
Treating with Silvadene.

## 2022-09-25 NOTE — Assessment & Plan Note (Signed)
Trial of Vesicare. 
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
## 2022-10-18 ENCOUNTER — Other Ambulatory Visit: Payer: Self-pay | Admitting: Family Medicine

## 2022-10-18 ENCOUNTER — Other Ambulatory Visit (HOSPITAL_COMMUNITY): Payer: Self-pay | Admitting: Psychiatry

## 2022-11-08 ENCOUNTER — Other Ambulatory Visit: Payer: Self-pay | Admitting: *Deleted

## 2022-11-08 MED ORDER — CETIRIZINE HCL 10 MG PO TABS: 10.0000 mg | ORAL_TABLET | Freq: Every day | ORAL | 0 refills | Status: DC

## 2022-11-14 ENCOUNTER — Telehealth (INDEPENDENT_AMBULATORY_CARE_PROVIDER_SITE_OTHER): Payer: 59 | Admitting: Psychiatry

## 2022-11-14 ENCOUNTER — Encounter (HOSPITAL_COMMUNITY): Payer: Self-pay | Admitting: Psychiatry

## 2022-11-14 DIAGNOSIS — F319 Bipolar disorder, unspecified: Secondary | ICD-10-CM | POA: Diagnosis not present

## 2022-11-14 DIAGNOSIS — F4310 Post-traumatic stress disorder, unspecified: Secondary | ICD-10-CM

## 2022-11-14 MED ORDER — ESCITALOPRAM OXALATE 20 MG PO TABS: 30.0000 mg | ORAL_TABLET | Freq: Every day | ORAL | 2 refills | Status: DC

## 2022-11-14 MED ORDER — TRAZODONE HCL 150 MG PO TABS: 300.0000 mg | ORAL_TABLET | Freq: Every day | ORAL | 2 refills | Status: DC

## 2022-11-14 MED ORDER — LAMOTRIGINE 100 MG PO TABS: 100.0000 mg | ORAL_TABLET | Freq: Two times a day (BID) | ORAL | 3 refills | Status: DC

## 2022-11-14 MED ORDER — ALPRAZOLAM 1 MG PO TABS: ORAL_TABLET | ORAL | 2 refills | Status: DC

## 2022-11-14 NOTE — Progress Notes (Signed)
Virtual Visit via Video Note  I connected with Annette Galvan on 11/14/22 at 11:00 AM EDT by a video enabled telemedicine application and verified that I am speaking with the correct person using two identifiers.  Location: Patient: home Provider: office   I discussed the limitations of evaluation and management by telemedicine and the availability of in person appointments. The patient expressed understanding and agreed to proceed.     I discussed the assessment and treatment plan with the patient. The patient was provided an opportunity to ask questions and all were answered. The patient agreed with the plan and demonstrated an understanding of the instructions.   The patient was advised to call back or seek an in-person evaluation if the symptoms worsen or if the condition fails to improve as anticipated.  I provided 20 minutes of non-face-to-face time during this encounter.   Diannia Ruder, MD  [facility] MD/PA/NP OP Progress Note  11/14/2022 11:29 AM Annette Galvan  MRN:  [id number]  Chief Complaint:  Chief Complaint  Patient presents with   Depression   Anxiety   Follow-up   HPI: This patient is a 62 year old widowed white female who lives alone in [place].  She has 2 grown daughters and 3 grandchildren.  She is on disability.  The patient returns for follow-up after 4 months regarding her depression and anxiety.  From the notes it appears that when she saw her primary doctor last month she was complaining of being very depressed.  However now she states that "I am okay."  She states she was having a bad day that time because her sister had not been kind to her.  To her credit she has lost about 20 pounds of weight and is going to try to qualify for knee surgery.  Right now she denies significant depression anxiety or thoughts of self-harm.  Has not ever she was tearful because her daughter and granddaughter seem to be too busy to spend much time with her.  She still sees her grandson  quite frequently.  She still thinks the medications are helpful for her mood and anxiety and sleep Visit Diagnosis:    ICD-10-CM   1. Bipolar 1 disorder (HCC)  F31.9     2. PTSD (post-traumatic stress disorder)  F43.10       Past Psychiatric History: History of depression anxiety and a remote history of substance abuse.  Admissions in her 80s and 30s  Past Medical History:  Past Medical History:  Diagnosis Date   Arthritis    needs 2 knee replacements; also has left shoulder and left hip pain   Back pain, chronic    Bilateral chronic knee pain    Bipolar 1 disorder (HCC)    Bulging disc    Fibromyalgia    GERD (gastroesophageal reflux disease)    HSV-2 (herpes simplex virus 2) infection 2013   Hx of trichomoniasis    Itching 06/26/2013   Leg pain    Trichimoniasis 06/26/2013   UTI (lower urinary tract infection) 10/12/2012   Vaginal discharge 06/26/2013   Vaginal irritation 12/05/2013   Vaginal odor 12/28/2012   Had discharge +clue   Yeast infection 02/04/2013    Past Surgical History:  Procedure Laterality Date   ABDOMINAL HYSTERECTOMY     total   BREAST ENHANCEMENT SURGERY Right    COLONOSCOPY WITH PROPOFOL N/A 12/07/2015   Procedure: COLONOSCOPY WITH PROPOFOL;  Surgeon: Corbin Ade, MD;  Location: [facility];  Service: Endoscopy;  Laterality: N/A;  915   TUBAL LIGATION      Family Psychiatric History: See below  Family History:  Family History  Problem Relation Age of Onset   Hypertension Mother    Diabetes Mother    Depression Mother    Hyperlipidemia Mother    Fibromyalgia Mother    Arthritis Mother    Cancer Father        bone   Hyperlipidemia Father    Hypertension Father    Bipolar disorder Father    Depression Sister    Hyperlipidemia Sister    Fibromyalgia Sister    Fibromyalgia Sister    Bipolar disorder Other    Drug abuse Other    Alcohol abuse Other    Hypertension Brother    Other Brother        shingles; back problems   Diabetes  Paternal Grandmother    Alzheimer's disease Maternal Grandmother    Obesity Daughter    Other Daughter        back problems   Bipolar disorder Daughter    Depression Daughter    Other Daughter        on pain meds   Colon cancer Neg Hx     Social History:  Social History   Socioeconomic History   Marital status: Widowed    Spouse name: Not on file   Number of children: Not on file   Years of education: Not on file   Highest education level: Not on file  Occupational History   Occupation: Disabled  Tobacco Use   Smoking status: Light Smoker    Current packs/day: 0.25    Average packs/day: 0.3 packs/day for 25.0 years (6.3 ttl pk-yrs)    Types: Cigarettes   Smokeless tobacco: Never   Tobacco comments:    2 cigarettes daily  Vaping Use   Vaping status: Never Used  Substance and Sexual Activity   Alcohol use: Yes    Alcohol/week: 0.0 standard drinks of alcohol    Comment: occasional   Drug use: No    Comment: cocaine, clean for 4.5 years as of 12/04/2015   Sexual activity: Yes    Birth control/protection: Surgical    Comment: hysterectomy  Other Topics Concern   Not on file  Social History Narrative   Not on file   Social Determinants of Health   Financial Resource Strain: Low Risk  (08/20/2020)   Overall Financial Resource Strain (CARDIA)    Difficulty of Paying Living Expenses: Not hard at all  Food Insecurity: No Food Insecurity (08/20/2020)   Hunger Vital Sign    Worried About Running Out of Food in the Last Year: Never true    Ran Out of Food in the Last Year: Never true  Transportation Needs: No Transportation Needs (08/20/2020)   PRAPARE - Administrator, Civil Service (Medical): No    Lack of Transportation (Non-Medical): No  Physical Activity: Inactive (08/20/2020)   Exercise Vital Sign    Days of Exercise per Week: 0 days    Minutes of Exercise per Session: 0 min  Stress: No Stress Concern Present (08/20/2020)   [employer] of Occupational  Health - Occupational Stress Questionnaire    Feeling of Stress : Not at all  Social Connections: Socially Isolated (08/20/2020)   Social Connection and Isolation Panel [NHANES]    Frequency of Communication with Friends and Family: More than three times a week    Frequency of Social Gatherings with Friends and Family: Once a week  Attends Religious Services: Never    Active Member of Clubs or Organizations: No    Attends Banker Meetings: Never    Marital Status: Widowed    Allergies:  Allergies  Allergen Reactions   Amoxicillin Hives   Hydrocodone Nausea And Vomiting   Latex Swelling    Ankle Area   Lisinopril Cough   Ampicillin Hives and Nausea And Vomiting    Metabolic Disorder Labs: Lab Results  Component Value Date   HGBA1C 5.7 (H) 05/12/2022   MPG 117 05/18/2020   MPG 108 12/02/2019   No results found for: "PROLACTIN" Lab Results  Component Value Date   CHOL 178 05/12/2022   TRIG 147 05/12/2022   HDL 60 05/12/2022   CHOLHDL 3.0 05/12/2022   VLDL 23 08/12/2016   LDLCALC 93 05/12/2022   LDLCALC 74 10/25/2021   Lab Results  Component Value Date   TSH 0.601 05/12/2022   TSH 0.520 05/25/2021    Therapeutic Level Labs: No results found for: "LITHIUM" No results found for: "VALPROATE" No results found for: "CBMZ"  Current Medications: Current Outpatient Medications  Medication Sig Dispense Refill   acetaminophen (TYLENOL) 500 MG tablet Take 1 tablet (500 mg total) by mouth every 6 (six) hours as needed. 30 tablet 0   albuterol (VENTOLIN HFA) 108 (90 Base) MCG/ACT inhaler INHALE 2 PUFFS EVERY 6 HOURS AS NEEDED. 8.5 g 0   ALPRAZolam (XANAX) 1 MG tablet TAKE (1) TABLET BY MOUTH (4) TIMES DAILY AS NEEDED FOR ANXIETY. 120 tablet 2   cetirizine (ZYRTEC) 10 MG tablet Take 1 tablet (10 mg total) by mouth daily. 30 tablet 0   chlorhexidine (PERIDEX) 0.12 % solution SMARTSIG:By Mouth     diclofenac Sodium (VOLTAREN) 1 % GEL APPLY 2 GRAMS TO THE AFFECTED  AREAS FOUR TIMES A DAY. 100 g 0   escitalopram (LEXAPRO) 20 MG tablet Take 1.5 tablets (30 mg total) by mouth daily. 45 tablet 2   fluticasone (FLONASE) 50 MCG/ACT nasal spray PLACE 2 SPRAYS IN EACH NOSTRIL ONCE DAILY AS DIRECTED PRN HEAD CONGESTION 16 g 0   gabapentin (NEURONTIN) 300 MG capsule Take 2 capsules (600 mg total) by mouth 3 (three) times daily. 180 capsule 11   ibuprofen (ADVIL) 800 MG tablet TAKE (1) TABLET BY MOUTH EVERY EIGHT HOURS FOR MILD OR MODERATE PAIN. 30 tablet 0   lamoTRIgine (LAMICTAL) 100 MG tablet Take 1 tablet (100 mg total) by mouth 2 (two) times daily. 60 tablet 3   losartan (COZAAR) 50 MG tablet Take 1 tablet (50 mg total) by mouth daily. 90 tablet 1   metFORMIN (GLUCOPHAGE) 500 MG tablet Take 1 tablet (500 mg total) by mouth 2 (two) times daily with a meal. 180 tablet 3   mometasone (NASONEX) 50 MCG/ACT nasal spray Place 2 sprays into the nose daily. 1 each 2   omeprazole (PRILOSEC) 40 MG capsule TAKE (1) CAPSULE BY MOUTH ONCE DAILY FOR ACID REFLUX. 90 capsule 0   ondansetron (ZOFRAN) 4 MG tablet TAKE (1) TABLET BY MOUTH EVERY EIGHT HOURS AS NEEDED. 20 tablet 0   rosuvastatin (CRESTOR) 5 MG tablet Take 1 tablet (5 mg total) by mouth daily. 90 tablet 3   silver sulfADIAZINE (SILVADENE) 1 % cream Apply 1 Application topically daily. 50 g 0   solifenacin (VESICARE) 5 MG tablet Take 1 tablet (5 mg total) by mouth daily. 90 tablet 1   tiZANidine (ZANAFLEX) 4 MG tablet TAKE 1 TABLET BY MOUTH ONCE EVERY 8 HOURS AS NEEDED FOR  MUSCLE SPASMS. 90 tablet 0   traZODone (DESYREL) 150 MG tablet Take 2 tablets (300 mg total) by mouth at bedtime. 60 tablet 2   Vitamin D, Ergocalciferol, (DRISDOL) 1.25 MG (50000 UNIT) CAPS capsule Take 1 capsule (50,000 Units total) by mouth every 7 (seven) days. 12 capsule 0   No current facility-administered medications for this visit.     Musculoskeletal: Strength & Muscle Tone: within normal limits Gait & Station: normal Patient leans:  N/A  Psychiatric Specialty Exam: Review of Systems  Musculoskeletal:  Positive for arthralgias.    There were no vitals taken for this visit.There is no height or weight on file to calculate BMI.  General Appearance: Casual and Fairly Groomed  Eye Contact:  Fair  Speech:  Clear and Coherent  Volume:  Normal  Mood:  Euthymic  Affect:  Congruent and Full Range  Thought Process:  Goal Directed  Orientation:  Full (Time, Place, and Person)  Thought Content: Rumination   Suicidal Thoughts:  No  Homicidal Thoughts:  No  Memory:  Immediate;   Good Recent;   Good Remote;   Fair  Judgement:  Fair  Insight:  Fair  Psychomotor Activity:  Normal  Concentration:  Concentration: Fair and Attention Span: Fair  Recall:  Good  Fund of Knowledge: Good  Language: Good  Akathisia:  No  Handed:  Right  AIMS (if indicated): not done  Assets:  Communication Skills Desire for Improvement Resilience Social Support  ADL's:  Intact  Cognition: WNL  Sleep:  Good   Screenings: GAD-7    Flowsheet Row Office Visit from 09/23/2022 in [facility]  Total GAD-7 Score 5      PHQ2-9    Flowsheet Row Office Visit from 09/23/2022 in [facility] Video Visit from 01/10/2022 in [facility] at [place] Video Visit from 10/11/2021 in [facility] at [place] Video Visit from 06/24/2021 in [facility] at [place] Video Visit from 03/26/2021 in [facility] at [facility] Total Score 4 1 1  0 0  PHQ-9 Total Score 14 -- -- -- --      Flowsheet Row Video Visit from 01/10/2022 in [facility] at [place] Video Visit from 10/11/2021 in [facility] at [place] Video Visit from 06/24/2021 in [facility] at [place]  C-SSRS RISK CATEGORY No Risk No Risk No  Risk        Assessment and Plan: This patient is a 62 year old female with a history of PTSD and anxiety.  Her main issue seems to be loneliness and feeling like her family is going in different directions.  However her aide comes to see her several times a week and this really helps.  For now she will continue trazodone 300 mg at bedtime for sleep, Lexapro 30 mg daily for depression, Lamictal 100 mg twice daily for mood stabilization and Xanax 1 mg 4 times daily as needed for anxiety.  She will return to see me in 3 months  Collaboration of Care: Collaboration of Care: Primary Care Provider AEB notes are shared with PCP on the epic system  Patient/Guardian was advised Release of Information must be obtained prior to any record release in order to collaborate their care with an outside provider. Patient/Guardian was advised if they have not already done so to contact the registration department to sign all necessary forms in order for  [place] to release information regarding their care.   Consent: Patient/Guardian gives verbal consent for treatment and assignment of benefits for services provided during this visit. Patient/Guardian expressed understanding and agreed to proceed.    Diannia Ruder, MD 11/14/2022, 11:29 AM

## 2022-12-01 ENCOUNTER — Other Ambulatory Visit: Payer: Self-pay | Admitting: Family Medicine

## 2022-12-13 ENCOUNTER — Other Ambulatory Visit: Payer: Self-pay | Admitting: Family Medicine

## 2022-12-21 ENCOUNTER — Other Ambulatory Visit: Payer: Self-pay | Admitting: Family Medicine

## 2022-12-29 ENCOUNTER — Other Ambulatory Visit: Payer: Self-pay | Admitting: Nurse Practitioner

## 2022-12-29 ENCOUNTER — Telehealth: Payer: Self-pay | Admitting: Family Medicine

## 2022-12-29 MED ORDER — LOSARTAN POTASSIUM 50 MG PO TABS: 50.0000 mg | ORAL_TABLET | Freq: Every day | ORAL | 0 refills | Status: DC

## 2022-12-29 NOTE — Telephone Encounter (Signed)
Refill on  losartan (COZAAR) 50 MG tablet with potassum, Vitamin D, Ergocalciferol, (DRISDOL) 1.25 MG (50000 UNIT) CAPS capsule send to [facility] Last seen 09/23/2022 last filled  07/29/21

## 2022-12-30 NOTE — Telephone Encounter (Signed)
Patient notified

## 2022-12-30 NOTE — Telephone Encounter (Signed)
Campbell Riches, NP     I refilled Losartan but Dr. Adriana Simas indicated the vitamin D was a short term Rx. Recommend she start OTC vitamin D 2000 units per day. Thanks.

## 2023-01-20 ENCOUNTER — Other Ambulatory Visit: Payer: Self-pay | Admitting: Family Medicine

## 2023-01-24 ENCOUNTER — Telehealth: Payer: Self-pay | Admitting: Family Medicine

## 2023-01-24 ENCOUNTER — Other Ambulatory Visit: Payer: Self-pay | Admitting: Family Medicine

## 2023-01-24 DIAGNOSIS — M797 Fibromyalgia: Secondary | ICD-10-CM

## 2023-01-24 MED ORDER — TIZANIDINE HCL 4 MG PO TABS
ORAL_TABLET | ORAL | 0 refills | Status: DC
Start: 2023-01-24 — End: 2023-02-21

## 2023-01-24 NOTE — Telephone Encounter (Signed)
Refill on  tiZANidine (ZANAFLEX) 4 MG tablet  send to [facility]

## 2023-01-30 ENCOUNTER — Other Ambulatory Visit (HOSPITAL_COMMUNITY): Payer: Self-pay | Admitting: Psychiatry

## 2023-02-06 ENCOUNTER — Encounter (HOSPITAL_COMMUNITY): Payer: Self-pay | Admitting: Psychiatry

## 2023-02-06 ENCOUNTER — Telehealth (INDEPENDENT_AMBULATORY_CARE_PROVIDER_SITE_OTHER): Payer: 59 | Admitting: Psychiatry

## 2023-02-06 DIAGNOSIS — F4310 Post-traumatic stress disorder, unspecified: Secondary | ICD-10-CM | POA: Diagnosis not present

## 2023-02-06 DIAGNOSIS — F319 Bipolar disorder, unspecified: Secondary | ICD-10-CM | POA: Diagnosis not present

## 2023-02-06 MED ORDER — ESCITALOPRAM OXALATE 20 MG PO TABS: 40.0000 mg | ORAL_TABLET | Freq: Every day | ORAL | 2 refills | Status: DC

## 2023-02-06 MED ORDER — TRAZODONE HCL 150 MG PO TABS: 300.0000 mg | ORAL_TABLET | Freq: Every day | ORAL | 2 refills | Status: DC

## 2023-02-06 MED ORDER — ALPRAZOLAM 1 MG PO TABS: ORAL_TABLET | ORAL | 2 refills | Status: DC

## 2023-02-06 MED ORDER — LAMOTRIGINE 100 MG PO TABS: 100.0000 mg | ORAL_TABLET | Freq: Two times a day (BID) | ORAL | 3 refills | Status: DC

## 2023-02-06 NOTE — Progress Notes (Signed)
Virtual Visit via Video Note  I connected with Annette Galvan on 02/06/23 at 11:00 AM EDT by a video enabled telemedicine application and verified that I am speaking with the correct person using two identifiers.  Location: Patient: home Provider: office   I discussed the limitations of evaluation and management by telemedicine and the availability of in person appointments. The patient expressed understanding and agreed to proceed.     I discussed the assessment and treatment plan with the patient. The patient was provided an opportunity to ask questions and all were answered. The patient agreed with the plan and demonstrated an understanding of the instructions.   The patient was advised to call back or seek an in-person evaluation if the symptoms worsen or if the condition fails to improve as anticipated.  I provided 15 minutes of non-face-to-face time during this encounter.   Diannia Ruder, MD  [facility] MD/PA/NP OP Progress Note  02/06/2023 11:20 AM Annette Galvan  MRN:  [id number]  Chief Complaint:  Chief Complaint  Patient presents with   Anxiety   Depression   Follow-up   HPI: This patient is a 62 year old widowed white female who lives alone in [place]. She has 2 grown daughters and 3 grandchildren. She is on disability.   The patient returns for follow-up after 3 months regarding her depression and anxiety.  She states that she thinks she has ADHD.  She states several of her siblings have.  She finds that she gets easily distracted and off task but also tends to overthink a lot and worry about a lots of different people in her life.  To me this sounds a bit more like anxiety.  She became a little bit tearful when talking about her granddaughter's issues.  Apparently the child is overweight and is getting teased.  She does take Xanax 1 mg up to 4 times daily and I would be reluctant to add stimulants to another controlled drug.  Instead I suggested we go up a bit on the Lexapro  to help with her anxiety and she is willing to try this.  Most of the time she sleeps pretty well she is continuing to lose weight and is planning to join the [employer].  She thinks she needs to get out more.  She denies thoughts of self-harm or suicide.  Visit Diagnosis:    ICD-10-CM   1. Bipolar 1 disorder (HCC)  F31.9     2. PTSD (post-traumatic stress disorder)  F43.10       Past Psychiatric History: History of depression anxiety and a remote history of substance abuse.  Psychiatric admissions in her 62s and 30s  Past Medical History:  Past Medical History:  Diagnosis Date   Arthritis    needs 2 knee replacements; also has left shoulder and left hip pain   Back pain, chronic    Bilateral chronic knee pain    Bipolar 1 disorder (HCC)    Bulging disc    Fibromyalgia    GERD (gastroesophageal reflux disease)    HSV-2 (herpes simplex virus 2) infection 2013   Hx of trichomoniasis    Itching 06/26/2013   Leg pain    Trichimoniasis 06/26/2013   UTI (lower urinary tract infection) 10/12/2012   Vaginal discharge 06/26/2013   Vaginal irritation 12/05/2013   Vaginal odor 12/28/2012   Had discharge +clue   Yeast infection 02/04/2013    Past Surgical History:  Procedure Laterality Date   ABDOMINAL HYSTERECTOMY     total  BREAST ENHANCEMENT SURGERY Right    COLONOSCOPY WITH PROPOFOL N/A 12/07/2015   Procedure: COLONOSCOPY WITH PROPOFOL;  Surgeon: Corbin Ade, MD;  [facility];  Service: Endoscopy;  Laterality: N/A;  915   TUBAL LIGATION      Family Psychiatric History: See below  Family History:  Family History  Problem Relation Age of Onset   Hypertension Mother    Diabetes Mother    Depression Mother    Hyperlipidemia Mother    Fibromyalgia Mother    Arthritis Mother    Cancer Father        bone   Hyperlipidemia Father    Hypertension Father    Bipolar disorder Father    Depression Sister    Hyperlipidemia Sister    Fibromyalgia Sister    Fibromyalgia  Sister    Bipolar disorder Other    Drug abuse Other    Alcohol abuse Other    Hypertension Brother    Other Brother        shingles; back problems   Diabetes Paternal Grandmother    Alzheimer's disease Maternal Grandmother    Obesity Daughter    Other Daughter        back problems   Bipolar disorder Daughter    Depression Daughter    Other Daughter        on pain meds   Colon cancer Neg Hx     Social History:  Social History   Socioeconomic History   Marital status: Widowed    Spouse name: Not on file   Number of children: Not on file   Years of education: Not on file   Highest education level: Not on file  Occupational History   Occupation: Disabled  Tobacco Use   Smoking status: Light Smoker    Current packs/day: 0.25    Average packs/day: 0.3 packs/day for 25.0 years (6.3 ttl pk-yrs)    Types: Cigarettes   Smokeless tobacco: Never   Tobacco comments:    2 cigarettes daily  Vaping Use   Vaping status: Never Used  Substance and Sexual Activity   Alcohol use: Yes    Alcohol/week: 0.0 standard drinks of alcohol    Comment: occasional   Drug use: No    Comment: cocaine, clean for 4.5 years as of 12/04/2015   Sexual activity: Yes    Birth control/protection: Surgical    Comment: hysterectomy  Other Topics Concern   Not on file  Social History Narrative   Not on file   Social Determinants of Health   Financial Resource Strain: Low Risk  (08/20/2020)   Overall Financial Resource Strain (CARDIA)    Difficulty of Paying Living Expenses: Not hard at all  Food Insecurity: No Food Insecurity (08/20/2020)   Hunger Vital Sign    Worried About Running Out of Food in the Last Year: Never true    Ran Out of Food in the Last Year: Never true  Transportation Needs: No Transportation Needs (08/20/2020)   PRAPARE - Administrator, Civil Service (Medical): No    Lack of Transportation (Non-Medical): No  Physical Activity: Inactive (08/20/2020)   Exercise Vital Sign     Days of Exercise per Week: 0 days    Minutes of Exercise per Session: 0 min  Stress: No Stress Concern Present (08/20/2020)   [employer] of Occupational Health - Occupational Stress Questionnaire    Feeling of Stress : Not at all  Social Connections: Socially Isolated (08/20/2020)   Social  Connection and Isolation Panel [NHANES]    Frequency of Communication with Friends and Family: More than three times a week    Frequency of Social Gatherings with Friends and Family: Once a week    Attends Religious Services: Never    Database administrator or Organizations: No    Attends Banker Meetings: Never    Marital Status: Widowed    Allergies:  Allergies  Allergen Reactions   Amoxicillin Hives   Hydrocodone Nausea And Vomiting   Latex Swelling    Ankle Area   Lisinopril Cough   Ampicillin Hives and Nausea And Vomiting    Metabolic Disorder Labs: Lab Results  Component Value Date   HGBA1C 5.7 (H) 05/12/2022   MPG 117 05/18/2020   MPG 108 12/02/2019   No results found for: "PROLACTIN" Lab Results  Component Value Date   CHOL 178 05/12/2022   TRIG 147 05/12/2022   HDL 60 05/12/2022   CHOLHDL 3.0 05/12/2022   VLDL 23 08/12/2016   LDLCALC 93 05/12/2022   LDLCALC 74 10/25/2021   Lab Results  Component Value Date   TSH 0.601 05/12/2022   TSH 0.520 05/25/2021    Therapeutic Level Labs: No results found for: "LITHIUM" No results found for: "VALPROATE" No results found for: "CBMZ"  Current Medications: Current Outpatient Medications  Medication Sig Dispense Refill   acetaminophen (TYLENOL) 500 MG tablet Take 1 tablet (500 mg total) by mouth every 6 (six) hours as needed. 30 tablet 0   albuterol (VENTOLIN HFA) 108 (90 Base) MCG/ACT inhaler INHALE 2 PUFFS EVERY 6 HOURS AS NEEDED. 8.5 g 0   ALPRAZolam (XANAX) 1 MG tablet TAKE (1) TABLET BY MOUTH (4) TIMES DAILY AS NEEDED FOR ANXIETY. 120 tablet 2   cetirizine (ZYRTEC) 10 MG tablet TAKE (1) TABLET BY MOUTH  ONCE DAILY. 30 tablet 2   chlorhexidine (PERIDEX) 0.12 % solution SMARTSIG:By Mouth     diclofenac Sodium (VOLTAREN) 1 % GEL APPLY 2 GRAMS TO THE AFFECTED AREAS FOUR TIMES A DAY. 100 g 0   escitalopram (LEXAPRO) 20 MG tablet Take 2 tablets (40 mg total) by mouth daily. 60 tablet 2   fluticasone (FLONASE) 50 MCG/ACT nasal spray PLACE 2 SPRAYS IN EACH NOSTRIL ONCE DAILY AS DIRECTED PRN HEAD CONGESTION 16 g 0   gabapentin (NEURONTIN) 300 MG capsule Take 2 capsules (600 mg total) by mouth 3 (three) times daily. 180 capsule 11   ibuprofen (ADVIL) 800 MG tablet TAKE (1) TABLET BY MOUTH EVERY EIGHT HOURS FOR MILD OR MODERATE PAIN. 30 tablet 1   lamoTRIgine (LAMICTAL) 100 MG tablet Take 1 tablet (100 mg total) by mouth 2 (two) times daily. 60 tablet 3   losartan (COZAAR) 50 MG tablet Take 1 tablet (50 mg total) by mouth daily. 90 tablet 0   metFORMIN (GLUCOPHAGE) 500 MG tablet Take 1 tablet (500 mg total) by mouth 2 (two) times daily with a meal. 180 tablet 3   mometasone (NASONEX) 50 MCG/ACT nasal spray Place 2 sprays into the nose daily. 1 each 2   omeprazole (PRILOSEC) 40 MG capsule TAKE (1) CAPSULE BY MOUTH ONCE DAILY FOR ACID REFLUX. 90 capsule 0   ondansetron (ZOFRAN) 4 MG tablet TAKE (1) TABLET BY MOUTH EVERY EIGHT HOURS AS NEEDED. 20 tablet 0   rosuvastatin (CRESTOR) 5 MG tablet Take 1 tablet (5 mg total) by mouth daily. 90 tablet 3   silver sulfADIAZINE (SILVADENE) 1 % cream Apply 1 Application topically daily. 50 g 0  solifenacin (VESICARE) 5 MG tablet Take 1 tablet (5 mg total) by mouth daily. 90 tablet 1   tiZANidine (ZANAFLEX) 4 MG tablet TAKE 1 TABLET BY MOUTH ONCE EVERY 8 HOURS AS NEEDED FOR MUSCLE SPASMS. 90 tablet 0   traZODone (DESYREL) 150 MG tablet Take 2 tablets (300 mg total) by mouth at bedtime. 60 tablet 2   Vitamin D, Ergocalciferol, (DRISDOL) 1.25 MG (50000 UNIT) CAPS capsule Take 1 capsule (50,000 Units total) by mouth every 7 (seven) days. 12 capsule 0   No current  facility-administered medications for this visit.     Musculoskeletal: Strength & Muscle Tone: within normal limits Gait & Station: normal Patient leans: N/A  Psychiatric Specialty Exam: Review of Systems  Musculoskeletal:  Positive for arthralgias.  Psychiatric/Behavioral:  Positive for decreased concentration. The patient is nervous/anxious.   All other systems reviewed and are negative.   There were no vitals taken for this visit.There is no height or weight on file to calculate BMI.  General Appearance: Casual and Fairly Groomed  Eye Contact:  Good  Speech:  Clear and Coherent  Volume:  Normal  Mood:  Anxious  Affect:  Congruent  Thought Process:  Goal Directed  Orientation:  Full (Time, Place, and Person)  Thought Content: Rumination   Suicidal Thoughts:  No  Homicidal Thoughts:  No  Memory:  Immediate;   Good Recent;   Good Remote;   NA  Judgement:  Good  Insight:  Fair  Psychomotor Activity:  Normal  Concentration:  Concentration: Poor and Attention Span: Poor  Recall:  Good  Fund of Knowledge: Good  Language: Good  Akathisia:  No  Handed:  Right  AIMS (if indicated): not done  Assets:  Communication Skills Desire for Improvement Resilience Social Support  ADL's:  Intact  Cognition: WNL  Sleep:  Good   Screenings: GAD-7    Flowsheet Row Office Visit from 09/23/2022 in [facility]  Total GAD-7 Score 5      PHQ2-9    Flowsheet Row Office Visit from 09/23/2022 in [facility] Video Visit from 01/10/2022 in [facility] at [place] Video Visit from 10/11/2021 in [facility] at [place] Video Visit from 06/24/2021 in [facility] Outpatient Behavioral Health at [place] Video Visit from 03/26/2021 in [facility] Outpatient Behavioral Health at [facility] Total Score 4 1 1  0 0  PHQ-9 Total Score 14 -- -- -- --      Flowsheet Row Video Visit  from 01/10/2022 in [facility] Health Outpatient Behavioral Health at [place] Video Visit from 10/11/2021 in [facility] Outpatient Behavioral Health at [place] Video Visit from 06/24/2021 in [facility] Outpatient Behavioral Health at [place]  C-SSRS RISK CATEGORY No Risk No Risk No Risk        Assessment and Plan: This patient is a 62 year old female with a history of PTSD and anxiety.  She asked about ADHD which I explained would be hard to diagnose at her age but we will keep it under consideration.  Primarily to me she seems more anxious and worried.  Will therefore increase Lexapro from 30 to 40 mg daily for depression and anxiety continue Lamictal 100 mg twice daily for mood stabilization and Xanax 1 mg up to 4 times daily as needed for anxiety as well as trazodone 300 mg at bedtime for sleep.  She will return to see me in 3 months  Collaboration of Care: Collaboration of Care: Primary  Care Provider AEB notes are shared with PCP on the epic system  Patient/Guardian was advised Release of Information must be obtained prior to any record release in order to collaborate their care with an outside provider. Patient/Guardian was advised if they have not already done so to contact the registration department to sign all necessary forms in order for [place] to release information regarding their care.   Consent: Patient/Guardian gives verbal consent for treatment and assignment of benefits for services provided during this visit. Patient/Guardian expressed understanding and agreed to proceed.    Diannia Ruder, MD 02/06/2023, 11:20 AM

## 2023-02-13 ENCOUNTER — Other Ambulatory Visit: Payer: Self-pay | Admitting: Family Medicine

## 2023-02-21 ENCOUNTER — Other Ambulatory Visit: Payer: Self-pay | Admitting: Family Medicine

## 2023-02-21 DIAGNOSIS — M797 Fibromyalgia: Secondary | ICD-10-CM

## 2023-03-03 ENCOUNTER — Telehealth: Payer: Self-pay | Admitting: Family Medicine

## 2023-03-03 ENCOUNTER — Other Ambulatory Visit: Payer: Self-pay

## 2023-03-03 MED ORDER — CETIRIZINE HCL 10 MG PO TABS: 10.0000 mg | ORAL_TABLET | Freq: Every day | ORAL | 2 refills | Status: DC

## 2023-03-03 NOTE — Telephone Encounter (Signed)
Refill on  cetirizine (ZYRTEC) 10 MG tablet  [facility]

## 2023-03-03 NOTE — Telephone Encounter (Signed)
Zyrtec has been refilled to [facility]

## 2023-03-13 ENCOUNTER — Other Ambulatory Visit: Payer: Self-pay | Admitting: Nurse Practitioner

## 2023-03-13 ENCOUNTER — Telehealth: Payer: Self-pay | Admitting: Family Medicine

## 2023-03-13 DIAGNOSIS — R062 Wheezing: Secondary | ICD-10-CM

## 2023-03-13 MED ORDER — ALBUTEROL SULFATE HFA 108 (90 BASE) MCG/ACT IN AERS: 2.0000 | INHALATION_SPRAY | Freq: Four times a day (QID) | RESPIRATORY_TRACT | 0 refills | Status: DC | PRN

## 2023-03-13 NOTE — Telephone Encounter (Signed)
Refill on  albuterol (VENTOLIN HFA) 108 (90 Base) MCG/ACT inhaler  send to [facility] last filled 09/09/22

## 2023-03-22 ENCOUNTER — Telehealth: Payer: Self-pay | Admitting: *Deleted

## 2023-03-22 NOTE — Telephone Encounter (Unsigned)
Copied from CRM [id number]. Topic: Clinical - Request for Lab/Test Order >> Mar 22, 2023  3:47 PM Fonda Kinder J wrote: Reason for CRM: Patient is requesting full STI blood work and urinalysis

## 2023-03-23 NOTE — Telephone Encounter (Signed)
Campbell Riches, NP     I will be happy to order these but needs office visit to make sure we order all the correct tests.

## 2023-03-23 NOTE — Telephone Encounter (Signed)
Called Annette Galvan to inform her that Eber Jones would like for her to have an appt to go over blood test before ordering them, Annette Galvan is scheduled for appt Tuesday 03/28/2023 @ 2:30pm

## 2023-03-27 ENCOUNTER — Other Ambulatory Visit: Payer: Self-pay | Admitting: Family Medicine

## 2023-03-28 ENCOUNTER — Ambulatory Visit: Payer: 59 | Admitting: Nurse Practitioner

## 2023-04-03 ENCOUNTER — Ambulatory Visit: Payer: 59 | Admitting: Family Medicine

## 2023-04-07 ENCOUNTER — Ambulatory Visit (INDEPENDENT_AMBULATORY_CARE_PROVIDER_SITE_OTHER): Payer: 59 | Admitting: Nurse Practitioner

## 2023-04-07 ENCOUNTER — Encounter: Payer: Self-pay | Admitting: Nurse Practitioner

## 2023-04-07 VITALS — BP 152/86 | HR 77 | Temp 98.1°F

## 2023-04-07 DIAGNOSIS — R350 Frequency of micturition: Secondary | ICD-10-CM | POA: Diagnosis not present

## 2023-04-07 DIAGNOSIS — Z202 Contact with and (suspected) exposure to infections with a predominantly sexual mode of transmission: Secondary | ICD-10-CM

## 2023-04-07 DIAGNOSIS — R8290 Unspecified abnormal findings in urine: Secondary | ICD-10-CM

## 2023-04-07 DIAGNOSIS — R102 Pelvic and perineal pain: Secondary | ICD-10-CM | POA: Diagnosis not present

## 2023-04-07 DIAGNOSIS — R3915 Urgency of urination: Secondary | ICD-10-CM

## 2023-04-07 NOTE — Progress Notes (Signed)
   Subjective:    Patient ID: Annette Galvan, female    DOB: 1960-05-02, 62 y.o.   MRN: [id number]  HPI Presents for complaints of odor in her urine.  Has been taking AZO for the past couple of days.  Pain in the low back area although patient states she has a history of arthritis.  Has had the same female sexual partner for over a year.  Recently found out he had unprotected sex with another partner.  No fever.  Some urgency and frequency.  No dysuria.  No mid back or flank tenderness.  Taking fluids fairly well.  Has dark urine at times with lack of fluid intake.  Denies vaginal discharge or rash.  No pelvic pain. Patient has had a partial hysterectomy.   Review of Systems  Constitutional:  Negative for fever.  Respiratory:  Negative for cough, chest tightness and shortness of breath.   Cardiovascular:  Negative for chest pain.  Genitourinary:  Positive for frequency and urgency. Negative for dysuria, flank pain, genital sores, pelvic pain and vaginal discharge.       Objective:   Physical Exam NAD.  Alert, oriented.  Lungs clear.  Heart regular rate rhythm.  Defers pelvic exam.  Patient states she is unable to get on the exam table in the lithotomy position due to significant knee pain.  Exam was performed while sitting in a chair.  Abdomen soft nondistended nontender.  Very mild tenderness to percussion in the left CVA area, none on the right.  Patient performed a vaginal self swab for STD testing. UA negative. Today's Vitals   04/07/23 1128  BP: (!) 152/86  Pulse: 77  Temp: 98.1 F (36.7 C)  SpO2: 95%   There is no height or weight on file to calculate BMI.      Assessment & Plan:  Pelvic pain - Plan: Urine Culture  Potential exposure to STD - Plan: HIV Antibody (routine testing w rflx), Hepatitis C antibody, GC/Chlamydia Probe Amp([facility])  Urinary frequency  Urinary urgency  Malodorous urine  Urine culture and STD testing pending. Increase fluid intake until urine is a  light yellow clear consistency. Further follow-up based on test results.  Warning signs reviewed.  Patient to get seen sooner if any new or worsening symptoms.

## 2023-04-08 LAB — HIV ANTIBODY (ROUTINE TESTING W REFLEX): HIV Screen 4th Generation wRfx: NONREACTIVE

## 2023-04-08 LAB — HEPATITIS C ANTIBODY: Hep C Virus Ab: NONREACTIVE

## 2023-04-10 ENCOUNTER — Other Ambulatory Visit: Payer: Self-pay | Admitting: Nurse Practitioner

## 2023-04-10 LAB — SPECIMEN STATUS REPORT

## 2023-04-10 LAB — URINE CULTURE

## 2023-04-10 LAB — GC/CHLAMYDIA PROBE AMP
Chlamydia trachomatis, NAA: NEGATIVE
Neisseria Gonorrhoeae by PCR: NEGATIVE

## 2023-04-10 MED ORDER — CEPHALEXIN 500 MG PO CAPS: 500.0000 mg | ORAL_CAPSULE | Freq: Three times a day (TID) | ORAL | 0 refills | Status: DC

## 2023-04-18 ENCOUNTER — Telehealth: Payer: Self-pay | Admitting: *Deleted

## 2023-04-18 ENCOUNTER — Other Ambulatory Visit: Payer: Self-pay | Admitting: Family Medicine

## 2023-04-18 MED ORDER — NITROFURANTOIN MONOHYD MACRO 100 MG PO CAPS: 100.0000 mg | ORAL_CAPSULE | Freq: Two times a day (BID) | ORAL | 0 refills | Status: DC

## 2023-04-18 NOTE — Telephone Encounter (Signed)
 Copied from CRM [id number]. Topic: Clinical - Medication Question >> Apr 18, 2023  9:40 AM Tonda B wrote: Reason for CRM: pt is calling she has been taking an antibiotic and she is done with that she do not fill like the first one helped her at all and she would like to try a different antibiotic

## 2023-04-20 NOTE — Telephone Encounter (Signed)
 Left message to return call to let patient know script has been sent in

## 2023-04-20 NOTE — Telephone Encounter (Signed)
 Annette Sams, DO     Rx for [employer] sent.

## 2023-04-24 ENCOUNTER — Other Ambulatory Visit (HOSPITAL_COMMUNITY): Payer: Self-pay | Admitting: Psychiatry

## 2023-04-25 ENCOUNTER — Other Ambulatory Visit: Payer: Self-pay | Admitting: Family Medicine

## 2023-04-25 ENCOUNTER — Telehealth: Payer: Self-pay | Admitting: *Deleted

## 2023-04-25 ENCOUNTER — Other Ambulatory Visit: Payer: Self-pay | Admitting: Nurse Practitioner

## 2023-04-25 DIAGNOSIS — R062 Wheezing: Secondary | ICD-10-CM

## 2023-04-25 MED ORDER — FLUTICASONE PROPIONATE 50 MCG/ACT NA SUSP: NASAL | 1 refills | Status: DC

## 2023-04-25 MED ORDER — ALBUTEROL SULFATE HFA 108 (90 BASE) MCG/ACT IN AERS: 2.0000 | INHALATION_SPRAY | Freq: Four times a day (QID) | RESPIRATORY_TRACT | 0 refills | Status: DC | PRN

## 2023-04-25 NOTE — Telephone Encounter (Signed)
 Patient stated she will call back and schedule an office visit if she has continued symptoms- she just finishing the second antibiotic.

## 2023-04-25 NOTE — Telephone Encounter (Signed)
 Copied from CRM [id number]. Topic: Clinical - Medication Refill >> Apr 25, 2023  1:43 PM Powell HERO wrote: Most Recent Primary Care Visit:  Provider: HOSKINS, CAROLYN C  Department: [facility]  Visit Type: ACUTE  Date: 04/07/2023  Medication: albuterol  (VENTOLIN  HFA) 108 (90 Base) MCG/ACT inhaler, fluticasone  (FLONASE ) 50 MCG/ACT nasal spray  Has the patient contacted their pharmacy? Yes They advised to call office=.  Is this the correct pharmacy for this prescription? Yes If no, delete pharmacy and type the correct one.  This is the patient's preferred pharmacy:  [facility] - [place], [place] - [address] [place] [address] Phone: [phone] Fax: [phone]   Has the prescription been filled recently? No  Is the patient out of the medication? Yes, lost inhaler and out of flonase   Has the patient been seen for an appointment in the last year OR does the patient have an upcoming appointment? Yes  Can we respond through MyChart? Phone please.   Agent: Please be advised that Rx refills may take up to 3 business days. We ask that you follow-up with your pharmacy.

## 2023-04-25 NOTE — Telephone Encounter (Signed)
 Patient picke up and completed med- see 04/25/23 message

## 2023-04-25 NOTE — Telephone Encounter (Signed)
 Copied from CRM [id number]. Topic: Clinical - Medication Question >> Apr 18, 2023  9:40 AM Tonda B wrote: Reason for CRM: pt is calling she has been taking an antibiotic and she is done with that she do not fill like the first one helped her at all and she would like to try a different antibiotic

## 2023-04-25 NOTE — Telephone Encounter (Signed)
Already ordered by Dr. Lacinda Axon

## 2023-04-25 NOTE — Telephone Encounter (Signed)
 Copied from CRM [id number]. Topic: Clinical - Medication Refill >> Apr 25, 2023  1:43 PM Powell HERO wrote: Most Recent Primary Care Visit:  Provider: HOSKINS, CAROLYN C  Department: [facility]  Visit Type: ACUTE  Date: 04/07/2023  Medication: albuterol  (VENTOLIN  HFA) 108 (90 Base) MCG/ACT inhaler  Has the patient contacted their pharmacy?  (Agent: If no, request that the patient contact the pharmacy for the refill. If patient does not wish to contact the pharmacy document the reason why and proceed with request.) (Agent: If yes, when and what did the pharmacy advise?)  Is this the correct pharmacy for this prescription?  If no, delete pharmacy and type the correct one.  This is the patient's preferred pharmacy:  [facility] - [place], [place] - [address] [place] [address] Phone: [phone] Fax: [phone]   Has the prescription been filled recently?   Is the patient out of the medication?   Has the patient been seen for an appointment in the last year OR does the patient have an upcoming appointment?   Can we respond through MyChart?   Agent: Please be advised that Rx refills may take up to 3 business days. We ask that you follow-up with your pharmacy.

## 2023-04-25 NOTE — Telephone Encounter (Signed)
 Annette Sams, DO     She has been treated with Keflex and I sent in Macrobid on 12/30.  If she is still having symptoms, she will need to be seen.

## 2023-04-26 ENCOUNTER — Ambulatory Visit: Payer: Self-pay | Admitting: Family Medicine

## 2023-04-26 ENCOUNTER — Other Ambulatory Visit: Payer: Self-pay

## 2023-04-26 ENCOUNTER — Telehealth: Payer: Self-pay | Admitting: Family Medicine

## 2023-04-26 DIAGNOSIS — R110 Nausea: Secondary | ICD-10-CM

## 2023-04-26 DIAGNOSIS — R062 Wheezing: Secondary | ICD-10-CM

## 2023-04-26 MED ORDER — ALBUTEROL SULFATE HFA 108 (90 BASE) MCG/ACT IN AERS: 2.0000 | INHALATION_SPRAY | Freq: Four times a day (QID) | RESPIRATORY_TRACT | 0 refills | Status: DC | PRN

## 2023-04-26 MED ORDER — ONDANSETRON HCL 4 MG PO TABS: ORAL_TABLET | ORAL | 0 refills | Status: DC

## 2023-04-26 NOTE — Telephone Encounter (Signed)
  Chief Complaint:  Patient reports increased nausea while taking antibiotic  for  her UTI. Patient would like an antiemetic  so she could complete antibiotic without feeling so bad.   Symptoms:  Nauseous Frequency:  Since being on antibiotic Pertinent Negatives: Patient denies vomiting. Disposition: [] ED /[] Urgent Care (no appt availability in office) / [] Appointment(In office/virtual)/ []  [facility]/ [] Home Care/ [] Refused Recommended Disposition /[] [facility]/ [x]  Follow-up with PCP Additional Notes:  Patient states he has to have the medicines that goes under her tongue. Medication history indicates Zofran  2024. Also, patient states her Urine is now clear and very light yellow.   Reason for Disposition  Taking prescription medication that could cause nausea (e.g., narcotics/opiates, antibiotics, OCPs, many others)  Answer Assessment - Initial Assessment Questions 1. NAUSEA SEVERITY: How bad is the nausea? (e.g., mild, moderate, severe; dehydration, weight loss)  Mild - She doesn't want to feel  nausea while trying to complete the antibiotic   - MILD: loss of appetite without change in eating habits   -    2. ONSET: When did the nausea begin?     Began with the new antibiotic 3. VOMITING: Any vomiting? If Yes, ask: How many times today?      Denies. 4. RECURRENT SYMPTOM: Have you had nausea before? If Yes, ask: When was the last time? What happened that time?     Yes and it helped . Zofran   5. CAUSE: What do you think is causing the nausea?     Antibiotic  Protocols used: Nausea-A-AH

## 2023-04-26 NOTE — Telephone Encounter (Signed)
 Called pt to inform rx has been sent in, No answer left msg for callback

## 2023-04-26 NOTE — Telephone Encounter (Signed)
 Copied from CRM [id number]. Topic: Clinical - Medication Question >> Apr 26, 2023 11:09 AM Deleta HERO wrote: Reason for CRM: The patient states she is feeling nauseous and she's requesting for a nurse to call in medication to help with the nausea.  Callback [phone]

## 2023-04-26 NOTE — Telephone Encounter (Signed)
 Refill on  ventolin HFA inhaler  send to  [facility]

## 2023-04-28 ENCOUNTER — Other Ambulatory Visit: Payer: Self-pay | Admitting: Family Medicine

## 2023-05-04 ENCOUNTER — Encounter (HOSPITAL_COMMUNITY): Payer: Self-pay | Admitting: Psychiatry

## 2023-05-04 ENCOUNTER — Telehealth (HOSPITAL_COMMUNITY): Payer: 59 | Admitting: Psychiatry

## 2023-05-04 DIAGNOSIS — F319 Bipolar disorder, unspecified: Secondary | ICD-10-CM

## 2023-05-04 DIAGNOSIS — F4310 Post-traumatic stress disorder, unspecified: Secondary | ICD-10-CM

## 2023-05-04 MED ORDER — ALPRAZOLAM 1 MG PO TABS: ORAL_TABLET | ORAL | 2 refills | Status: DC

## 2023-05-04 MED ORDER — ESCITALOPRAM OXALATE 20 MG PO TABS: 40.0000 mg | ORAL_TABLET | Freq: Every day | ORAL | 2 refills | Status: DC

## 2023-05-04 MED ORDER — LAMOTRIGINE 100 MG PO TABS: 100.0000 mg | ORAL_TABLET | Freq: Two times a day (BID) | ORAL | 3 refills | Status: DC

## 2023-05-04 MED ORDER — TRAZODONE HCL 150 MG PO TABS: 300.0000 mg | ORAL_TABLET | Freq: Every day | ORAL | 2 refills | Status: DC

## 2023-05-04 NOTE — Progress Notes (Signed)
Virtual Visit via Video Note  I connected with Annette Galvan on 05/04/23 at 11:20 AM EST by a video enabled telemedicine application and verified that I am speaking with the correct person using two identifiers.  Location: Patient: home Provider: office   I discussed the limitations of evaluation and management by telemedicine and the availability of in person appointments. The patient expressed understanding and agreed to proceed.      I discussed the assessment and treatment plan with the patient. The patient was provided an opportunity to ask questions and all were answered. The patient agreed with the plan and demonstrated an understanding of the instructions.   The patient was advised to call back or seek an in-person evaluation if the symptoms worsen or if the condition fails to improve as anticipated.  I provided 20 minutes of non-face-to-face time during this encounter.   Diannia Ruder, MD  [facility] MD/PA/NP OP Progress Note  05/04/2023 11:26 AM Annette Galvan  MRN:  [id number]  Chief Complaint:  Chief Complaint  Patient presents with   Anxiety   Depression   Follow-up   HPI: This patient is a 63 year old widowed white female who lives alone in [place]. She has 2 grown daughters and 3 grandchildren. She is on disability.   The patient returns for follow-up with her aide after 3 months regarding her depression and anxiety.  She states that for the most part she has been doing well.  She denies significant depression and anxiety.  Last time we increased her Lexapro and she feels like this has helped her general level of anxiety.  We discussed that Xanax 1 mg 4 times daily and I urged her to try to bring it down to 3 times a day as she is getting a bit older and it can affect cognitive function.  She states that she will try.  She claims that she is sleeping well her energy is good and she denies thoughts of self-harm or suicide Visit Diagnosis:    ICD-10-CM   1. Bipolar 1  disorder (HCC)  F31.9     2. PTSD (post-traumatic stress disorder)  F43.10       Past Psychiatric History: History of depression anxiety and a remote history of substance abuse. Psychiatric admissions in her 62s and 30s   Past Medical History:  Past Medical History:  Diagnosis Date   Arthritis    needs 2 knee replacements; also has left shoulder and left hip pain   Back pain, chronic    Bilateral chronic knee pain    Bipolar 1 disorder (HCC)    Bulging disc    Fibromyalgia    GERD (gastroesophageal reflux disease)    HSV-2 (herpes simplex virus 2) infection 2013   Hx of trichomoniasis    Itching 06/26/2013   Leg pain    Trichimoniasis 06/26/2013   UTI (lower urinary tract infection) 10/12/2012   Vaginal discharge 06/26/2013   Vaginal irritation 12/05/2013   Vaginal odor 12/28/2012   Had discharge +clue   Yeast infection 02/04/2013    Past Surgical History:  Procedure Laterality Date   ABDOMINAL HYSTERECTOMY     total   BREAST ENHANCEMENT SURGERY Right    COLONOSCOPY WITH PROPOFOL N/A 12/07/2015   Procedure: COLONOSCOPY WITH PROPOFOL;  Surgeon: Corbin Ade, MD;  Location: [facility];  Service: Endoscopy;  Laterality: N/A;  915   TUBAL LIGATION      Family Psychiatric History: See below  Family History:  Family History  Problem Relation Age of Onset   Hypertension Mother    Diabetes Mother    Depression Mother    Hyperlipidemia Mother    Fibromyalgia Mother    Arthritis Mother    Cancer Father        bone   Hyperlipidemia Father    Hypertension Father    Bipolar disorder Father    Depression Sister    Hyperlipidemia Sister    Fibromyalgia Sister    Fibromyalgia Sister    Bipolar disorder Other    Drug abuse Other    Alcohol abuse Other    Hypertension Brother    Other Brother        shingles; back problems   Diabetes Paternal Grandmother    Alzheimer's disease Maternal Grandmother    Obesity Daughter    Other Daughter        back problems   Bipolar  disorder Daughter    Depression Daughter    Other Daughter        on pain meds   Colon cancer Neg Hx     Social History:  Social History   Socioeconomic History   Marital status: Widowed    Spouse name: Not on file   Number of children: Not on file   Years of education: Not on file   Highest education level: Not on file  Occupational History   Occupation: Disabled  Tobacco Use   Smoking status: Light Smoker    Current packs/day: 0.25    Average packs/day: 0.3 packs/day for 25.0 years (6.3 ttl pk-yrs)    Types: Cigarettes   Smokeless tobacco: Never   Tobacco comments:    2 cigarettes daily  Vaping Use   Vaping status: Never Used  Substance and Sexual Activity   Alcohol use: Yes    Alcohol/week: 0.0 standard drinks of alcohol    Comment: occasional   Drug use: No    Comment: cocaine, clean for 4.5 years as of 12/04/2015   Sexual activity: Yes    Birth control/protection: Surgical    Comment: hysterectomy  Other Topics Concern   Not on file  Social History Narrative   Not on file   Social Drivers of Health   Financial Resource Strain: Low Risk  (08/20/2020)   Overall Financial Resource Strain (CARDIA)    Difficulty of Paying Living Expenses: Not hard at all  Food Insecurity: No Food Insecurity (08/20/2020)   Hunger Vital Sign    Worried About Running Out of Food in the Last Year: Never true    Ran Out of Food in the Last Year: Never true  Transportation Needs: No Transportation Needs (08/20/2020)   PRAPARE - Administrator, Civil Service (Medical): No    Lack of Transportation (Non-Medical): No  Physical Activity: Inactive (08/20/2020)   Exercise Vital Sign    Days of Exercise per Week: 0 days    Minutes of Exercise per Session: 0 min  Stress: No Stress Concern Present (08/20/2020)   [employer] of Occupational Health - Occupational Stress Questionnaire    Feeling of Stress : Not at all  Social Connections: Socially Isolated (08/20/2020)   Social  Connection and Isolation Panel [NHANES]    Frequency of Communication with Friends and Family: More than three times a week    Frequency of Social Gatherings with Friends and Family: Once a week    Attends Religious Services: Never    Database administrator or Organizations: No    Attends Club or  Organization Meetings: Never    Marital Status: Widowed    Allergies:  Allergies  Allergen Reactions   Amoxicillin Hives   Hydrocodone Nausea And Vomiting   Latex Swelling    Ankle Area   Lisinopril Cough   Ampicillin Hives and Nausea And Vomiting    Metabolic Disorder Labs: Lab Results  Component Value Date   HGBA1C 5.7 (H) 05/12/2022   MPG 117 05/18/2020   MPG 108 12/02/2019   No results found for: "PROLACTIN" Lab Results  Component Value Date   CHOL 178 05/12/2022   TRIG 147 05/12/2022   HDL 60 05/12/2022   CHOLHDL 3.0 05/12/2022   VLDL 23 08/12/2016   LDLCALC 93 05/12/2022   LDLCALC 74 10/25/2021   Lab Results  Component Value Date   TSH 0.601 05/12/2022   TSH 0.520 05/25/2021    Therapeutic Level Labs: No results found for: "LITHIUM" No results found for: "VALPROATE" No results found for: "CBMZ"  Current Medications: Current Outpatient Medications  Medication Sig Dispense Refill   acetaminophen (TYLENOL) 500 MG tablet Take 1 tablet (500 mg total) by mouth every 6 (six) hours as needed. 30 tablet 0   albuterol (VENTOLIN HFA) 108 (90 Base) MCG/ACT inhaler Inhale 2 puffs into the lungs every 6 (six) hours as needed. 8.5 g 0   ALPRAZolam (XANAX) 1 MG tablet TAKE (1) TABLET BY MOUTH (4) TIMES DAILY AS NEEDED FOR ANXIETY. 120 tablet 2   cephALEXin (KEFLEX) 500 MG capsule Take 1 capsule (500 mg total) by mouth 3 (three) times daily. For 5 days 15 capsule 0   cetirizine (ZYRTEC) 10 MG tablet Take 1 tablet (10 mg total) by mouth daily. 30 tablet 2   chlorhexidine (PERIDEX) 0.12 % solution SMARTSIG:By Mouth     diclofenac Sodium (VOLTAREN) 1 % GEL APPLY 2 GRAMS TO THE  AFFECTED AREAS FOUR TIMES A DAY. 100 g 0   escitalopram (LEXAPRO) 20 MG tablet Take 2 tablets (40 mg total) by mouth daily. 60 tablet 2   fluticasone (FLONASE) 50 MCG/ACT nasal spray PLACE 2 SPRAYS IN EACH NOSTRIL ONCE DAILY AS NEEDED FOR HEAD CONGESTION AS DIRECTED. 16 g 1   gabapentin (NEURONTIN) 300 MG capsule Take 2 capsules (600 mg total) by mouth 3 (three) times daily. 180 capsule 11   ibuprofen (ADVIL) 800 MG tablet TAKE (1) TABLET BY MOUTH EVERY EIGHT HOURS FOR MILD OR MODERATE PAIN. 30 tablet 1   lamoTRIgine (LAMICTAL) 100 MG tablet Take 1 tablet (100 mg total) by mouth 2 (two) times daily. 60 tablet 3   losartan (COZAAR) 50 MG tablet Take 1 tablet (50 mg total) by mouth daily. 90 tablet 0   metFORMIN (GLUCOPHAGE) 500 MG tablet Take 1 tablet (500 mg total) by mouth 2 (two) times daily with a meal. 180 tablet 3   mometasone (NASONEX) 50 MCG/ACT nasal spray Place 2 sprays into the nose daily. 1 each 2   nitrofurantoin, macrocrystal-monohydrate, (MACROBID) 100 MG capsule Take 1 capsule (100 mg total) by mouth 2 (two) times daily. 14 capsule 0   omeprazole (PRILOSEC) 40 MG capsule TAKE (1) CAPSULE BY MOUTH ONCE DAILY FOR ACID REFLUX. 90 capsule 0   ondansetron (ZOFRAN) 4 MG tablet TAKE (1) TABLET BY MOUTH EVERY EIGHT HOURS AS NEEDED. 20 tablet 0   rosuvastatin (CRESTOR) 5 MG tablet Take 1 tablet (5 mg total) by mouth daily. 90 tablet 3   silver sulfADIAZINE (SILVADENE) 1 % cream Apply 1 Application topically daily. 50 g 0   solifenacin (VESICARE)  5 MG tablet Take 1 tablet (5 mg total) by mouth daily. 90 tablet 1   tiZANidine (ZANAFLEX) 4 MG tablet TAKE 1 TABLET BY MOUTH ONCE EVERY 8 HOURS AS NEEDED FOR MUSCLE SPASMS. 90 tablet 3   traZODone (DESYREL) 150 MG tablet Take 2 tablets (300 mg total) by mouth at bedtime. 60 tablet 2   Vitamin D, Ergocalciferol, (DRISDOL) 1.25 MG (50000 UNIT) CAPS capsule Take 1 capsule (50,000 Units total) by mouth every 7 (seven) days. 12 capsule 0   No current  facility-administered medications for this visit.     Musculoskeletal: Strength & Muscle Tone: within normal limits Gait & Station: normal Patient leans: N/A  Psychiatric Specialty Exam: Review of Systems  All other systems reviewed and are negative.   There were no vitals taken for this visit.There is no height or weight on file to calculate BMI.  General Appearance: Casual and Fairly Groomed  Eye Contact:  Good  Speech:  Clear and Coherent  Volume:  Normal  Mood:  Euthymic  Affect:  Congruent  Thought Process:  Goal Directed  Orientation:  Full (Time, Place, and Person)  Thought Content: WDL   Suicidal Thoughts:  No  Homicidal Thoughts:  No  Memory:  Immediate;   Good Recent;   Fair Remote;   NA  Judgement:  Fair  Insight:  Fair  Psychomotor Activity:  Normal  Concentration:  Concentration: Good and Attention Span: Good  Recall:  Good  Fund of Knowledge: Good  Language: Good  Akathisia:  No  Handed:  Right  AIMS (if indicated): not done  Assets:  Communication Skills Desire for Improvement Resilience Social Support Talents/Skills  ADL's:  Intact  Cognition: WNL  Sleep:  Good   Screenings: GAD-7    Flowsheet Row Office Visit from 04/07/2023 in [facility] Office Visit from 09/23/2022 in [facility]  Total GAD-7 Score 5 5      PHQ2-9    Flowsheet Row Office Visit from 04/07/2023 in [facility] Office Visit from 09/23/2022 in [facility] Video Visit from 01/10/2022 in [facility] [place] Video Visit from 10/11/2021 in [facility] at [place] Video Visit from 06/24/2021 in [facility] at [facility] Total Score 3 4 1 1  0  PHQ-9 Total Score -- 14 -- -- --      Flowsheet Row Video Visit from 01/10/2022 in [facility] [place]  Video Visit from 10/11/2021 in [facility] [place] Video Visit from 06/24/2021 in [facility] at [place]  C-SSRS RISK CATEGORY No Risk No Risk No Risk        Assessment and Plan: This patient is a 63 year old female with a history of PTSD and anxiety.  She is doing better since we increased the Lexapro to 40 mg daily for depression and anxiety.  She will continue this as well as Lamictal 100 mg twice daily for mood stabilization, Xanax 1 mg up to 4 times daily as needed for anxiety and trazodone 300 mg at bedtime for sleep.  She is urged to keep the Xanax to 3 times daily.  She will return to see me in 3 months  Collaboration of Care: Collaboration of Care: Primary Care Provider AEB notes are shared with PCP on the epic system  Patient/Guardian was advised Release of Information must be obtained prior to  any record release in order to collaborate their care with an outside provider. Patient/Guardian was advised if they have not already done so to contact the registration department to sign all necessary forms in order for [place] to release information regarding their care.   Consent: Patient/Guardian gives verbal consent for treatment and assignment of benefits for services provided during this visit. Patient/Guardian expressed understanding and agreed to proceed.    Diannia Ruder, MD 05/04/2023, 11:26 AM

## 2023-05-23 DIAGNOSIS — R0682 Tachypnea, not elsewhere classified: Secondary | ICD-10-CM | POA: Diagnosis not present

## 2023-05-26 ENCOUNTER — Other Ambulatory Visit: Payer: Self-pay | Admitting: Family Medicine

## 2023-05-26 DIAGNOSIS — M797 Fibromyalgia: Secondary | ICD-10-CM

## 2023-06-07 ENCOUNTER — Other Ambulatory Visit: Payer: Self-pay | Admitting: Family Medicine

## 2023-06-07 DIAGNOSIS — M797 Fibromyalgia: Secondary | ICD-10-CM

## 2023-06-18 ENCOUNTER — Other Ambulatory Visit: Payer: Self-pay | Admitting: Family Medicine

## 2023-06-19 ENCOUNTER — Other Ambulatory Visit: Payer: Self-pay | Admitting: Family Medicine

## 2023-06-20 ENCOUNTER — Other Ambulatory Visit: Payer: Self-pay | Admitting: Nurse Practitioner

## 2023-07-04 NOTE — Telephone Encounter (Signed)
 Copied from CRM [id number]. Topic: Clinical - Prescription Issue >> Jul 04, 2023 12:53 PM Annette Galvan wrote: Reason for CRM: Patient has had Herpes for years and stopped taking medication because she didn't have any outbreaks//Now she is having an outbreak and would like something prescribed to be taken by mouth and a cream for her lip//Please call patient

## 2023-07-05 ENCOUNTER — Other Ambulatory Visit: Payer: Self-pay | Admitting: Family Medicine

## 2023-07-05 MED ORDER — ACYCLOVIR 400 MG PO TABS: 400.0000 mg | ORAL_TABLET | Freq: Three times a day (TID) | ORAL | 0 refills | Status: AC

## 2023-07-05 MED ORDER — ACYCLOVIR 5 % EX OINT: 1.0000 | TOPICAL_OINTMENT | Freq: Every day | CUTANEOUS | 0 refills | Status: AC

## 2023-07-18 ENCOUNTER — Encounter (HOSPITAL_COMMUNITY): Payer: Self-pay | Admitting: Psychiatry

## 2023-07-18 ENCOUNTER — Telehealth (INDEPENDENT_AMBULATORY_CARE_PROVIDER_SITE_OTHER): Admitting: Psychiatry

## 2023-07-18 DIAGNOSIS — F4310 Post-traumatic stress disorder, unspecified: Secondary | ICD-10-CM

## 2023-07-18 DIAGNOSIS — F319 Bipolar disorder, unspecified: Secondary | ICD-10-CM

## 2023-07-18 MED ORDER — ALPRAZOLAM 1 MG PO TABS: ORAL_TABLET | ORAL | 2 refills | Status: DC

## 2023-07-18 MED ORDER — TRAZODONE HCL 150 MG PO TABS: 300.0000 mg | ORAL_TABLET | Freq: Every day | ORAL | 2 refills | Status: DC

## 2023-07-18 MED ORDER — FLUOXETINE HCL 40 MG PO CAPS: 40.0000 mg | ORAL_CAPSULE | Freq: Every day | ORAL | 2 refills | Status: DC

## 2023-07-18 MED ORDER — LAMOTRIGINE 100 MG PO TABS: 100.0000 mg | ORAL_TABLET | Freq: Two times a day (BID) | ORAL | 3 refills | Status: DC

## 2023-07-18 NOTE — Progress Notes (Signed)
 Virtual Visit via Telephone Note  I connected with Annette Galvan on 07/18/23 at 11:20 AM EDT by telephone and verified that I am speaking with the correct person using two identifiers.  Location: Patient: home Provider: office   I discussed the limitations, risks, security and privacy concerns of performing an evaluation and management service by telephone and the availability of in person appointments. I also discussed with the patient that there may be a patient responsible charge related to this service. The patient expressed understanding and agreed to proceed.      I discussed the assessment and treatment plan with the patient. The patient was provided an opportunity to ask questions and all were answered. The patient agreed with the plan and demonstrated an understanding of the instructions.   The patient was advised to call back or seek an in-person evaluation if the symptoms worsen or if the condition fails to improve as anticipated.  I provided 20 minutes of non-face-to-face time during this encounter.   Diannia Ruder, MD  [facility] MD/PA/NP OP Progress Note  07/18/2023 11:33 AM Annette Galvan  MRN:  [id number]  Chief Complaint:  Chief Complaint  Patient presents with   Anxiety   Depression   Follow-up   HPI: This patient is a 63 year old widowed white female who lives alone in [place].  She does have a worker who comes in several times a week.  She is on disability.  The patient returns for follow-up after 3 months regarding her depression and anxiety and PTSD.  She states that she has been "overthinking."  Every time she has to do something she thinks about it over and over and over.  I explained that Lexapro is a treatment for this but perhaps Prozac would be better.  She would like to try switching to Prozac.  She states that the Xanax continue is to help with her anxiety and she is sleeping fairly well.  Her energy is pretty good and she denies any thoughts of self-harm or  suicide.  Other than her home health aide she does not really interact much with anybody but she claims that she is "does better when I stay by myself."  She denies significant depression or thoughts of self-harm. Visit Diagnosis:    ICD-10-CM   1. Bipolar 1 disorder (HCC)  F31.9     2. PTSD (post-traumatic stress disorder)  F43.10       Past Psychiatric History: History of depression anxiety and a remote history of substance abuse. Psychiatric admissions in her 73s and 45   Past Medical History:  Past Medical History:  Diagnosis Date   Arthritis    needs 2 knee replacements; also has left shoulder and left hip pain   Back pain, chronic    Bilateral chronic knee pain    Bipolar 1 disorder (HCC)    Bulging disc    Fibromyalgia    GERD (gastroesophageal reflux disease)    HSV-2 (herpes simplex virus 2) infection 2013   Hx of trichomoniasis    Itching 06/26/2013   Leg pain    Trichimoniasis 06/26/2013   UTI (lower urinary tract infection) 10/12/2012   Vaginal discharge 06/26/2013   Vaginal irritation 12/05/2013   Vaginal odor 12/28/2012   Had discharge +clue   Yeast infection 02/04/2013    Past Surgical History:  Procedure Laterality Date   ABDOMINAL HYSTERECTOMY     total   BREAST ENHANCEMENT SURGERY Right    COLONOSCOPY WITH PROPOFOL N/A 12/07/2015   Procedure:  COLONOSCOPY WITH PROPOFOL;  Surgeon: Corbin Ade, MD;  Location: [facility];  Service: Endoscopy;  Laterality: N/A;  915   TUBAL LIGATION      Family Psychiatric History: See below  Family History:  Family History  Problem Relation Age of Onset   Hypertension Mother    Diabetes Mother    Depression Mother    Hyperlipidemia Mother    Fibromyalgia Mother    Arthritis Mother    Cancer Father        bone   Hyperlipidemia Father    Hypertension Father    Bipolar disorder Father    Depression Sister    Hyperlipidemia Sister    Fibromyalgia Sister    Fibromyalgia Sister    Bipolar disorder Other    Drug  abuse Other    Alcohol abuse Other    Hypertension Brother    Other Brother        shingles; back problems   Diabetes Paternal Grandmother    Alzheimer's disease Maternal Grandmother    Obesity Daughter    Other Daughter        back problems   Bipolar disorder Daughter    Depression Daughter    Other Daughter        on pain meds   Colon cancer Neg Hx     Social History:  Social History   Socioeconomic History   Marital status: Widowed    Spouse name: Not on file   Number of children: Not on file   Years of education: Not on file   Highest education level: Not on file  Occupational History   Occupation: Disabled  Tobacco Use   Smoking status: Light Smoker    Current packs/day: 0.25    Average packs/day: 0.3 packs/day for 25.0 years (6.3 ttl pk-yrs)    Types: Cigarettes   Smokeless tobacco: Never   Tobacco comments:    2 cigarettes daily  Vaping Use   Vaping status: Never Used  Substance and Sexual Activity   Alcohol use: Yes    Alcohol/week: 0.0 standard drinks of alcohol    Comment: occasional   Drug use: No    Comment: cocaine, clean for 4.5 years as of 12/04/2015   Sexual activity: Yes    Birth control/protection: Surgical    Comment: hysterectomy  Other Topics Concern   Not on file  Social History Narrative   Not on file   Social Drivers of Health   Financial Resource Strain: Low Risk  (08/20/2020)   Overall Financial Resource Strain (CARDIA)    Difficulty of Paying Living Expenses: Not hard at all  Food Insecurity: No Food Insecurity (08/20/2020)   Hunger Vital Sign    Worried About Running Out of Food in the Last Year: Never true    Ran Out of Food in the Last Year: Never true  Transportation Needs: No Transportation Needs (08/20/2020)   PRAPARE - Administrator, Civil Service (Medical): No    Lack of Transportation (Non-Medical): No  Physical Activity: Inactive (08/20/2020)   Exercise Vital Sign    Days of Exercise per Week: 0 days     Minutes of Exercise per Session: 0 min  Stress: No Stress Concern Present (08/20/2020)   [employer] of Occupational Health - Occupational Stress Questionnaire    Feeling of Stress : Not at all  Social Connections: Socially Isolated (08/20/2020)   Social Connection and Isolation Panel [NHANES]    Frequency of Communication with Friends and Family:  More than three times a week    Frequency of Social Gatherings with Friends and Family: Once a week    Attends Religious Services: Never    Database administrator or Organizations: No    Attends Banker Meetings: Never    Marital Status: Widowed    Allergies:  Allergies  Allergen Reactions   Amoxicillin Hives   Hydrocodone Nausea And Vomiting   Latex Swelling    Ankle Area   Lisinopril Cough   Ampicillin Hives and Nausea And Vomiting    Metabolic Disorder Labs: Lab Results  Component Value Date   HGBA1C 5.7 (H) 05/12/2022   MPG 117 05/18/2020   MPG 108 12/02/2019   No results found for: "PROLACTIN" Lab Results  Component Value Date   CHOL 178 05/12/2022   TRIG 147 05/12/2022   HDL 60 05/12/2022   CHOLHDL 3.0 05/12/2022   VLDL 23 08/12/2016   LDLCALC 93 05/12/2022   LDLCALC 74 10/25/2021   Lab Results  Component Value Date   TSH 0.601 05/12/2022   TSH 0.520 05/25/2021    Therapeutic Level Labs: No results found for: "LITHIUM" No results found for: "VALPROATE" No results found for: "CBMZ"  Current Medications: Current Outpatient Medications  Medication Sig Dispense Refill   FLUoxetine (PROZAC) 40 MG capsule Take 1 capsule (40 mg total) by mouth daily. 30 capsule 2   acetaminophen (TYLENOL) 500 MG tablet Take 1 tablet (500 mg total) by mouth every 6 (six) hours as needed. 30 tablet 0   acyclovir ointment (ZOVIRAX) 5 % Apply 1 Application topically 5 (five) times daily. 30 g 0   albuterol (VENTOLIN HFA) 108 (90 Base) MCG/ACT inhaler Inhale 2 puffs into the lungs every 6 (six) hours as needed. 8.5 g 0    ALPRAZolam (XANAX) 1 MG tablet TAKE (1) TABLET BY MOUTH (4) TIMES DAILY AS NEEDED FOR ANXIETY. 120 tablet 2   cephALEXin (KEFLEX) 500 MG capsule Take 1 capsule (500 mg total) by mouth 3 (three) times daily. For 5 days 15 capsule 0   cetirizine (ZYRTEC) 10 MG tablet Take 1 tablet (10 mg total) by mouth daily. 30 tablet 2   chlorhexidine (PERIDEX) 0.12 % solution SMARTSIG:By Mouth     diclofenac Sodium (VOLTAREN) 1 % GEL APPLY 2 GRAMS TO THE AFFECTED AREAS FOUR TIMES A DAY. 100 g 0   fluticasone (FLONASE) 50 MCG/ACT nasal spray PLACE 2 SPRAYS IN EACH NOSTRIL ONCE DAILY AS NEEDED FOR HEAD CONGESTION AS DIRECTED. 16 g 1   gabapentin (NEURONTIN) 300 MG capsule TAKE 2 CAPSULES BY MOUTH 3 TIMES DAILY. 180 capsule 11   ibuprofen (ADVIL) 800 MG tablet TAKE (1) TABLET BY MOUTH EVERY EIGHT HOURS FOR MILD OR MODERATE PAIN. 30 tablet 1   lamoTRIgine (LAMICTAL) 100 MG tablet Take 1 tablet (100 mg total) by mouth 2 (two) times daily. 60 tablet 3   losartan (COZAAR) 50 MG tablet TAKE ONE TABLET BY MOUTH EVERY DAY 90 tablet 0   metFORMIN (GLUCOPHAGE) 500 MG tablet Take 1 tablet (500 mg total) by mouth 2 (two) times daily with a meal. 180 tablet 3   mometasone (NASONEX) 50 MCG/ACT nasal spray Place 2 sprays into the nose daily. 1 each 2   nitrofurantoin, macrocrystal-monohydrate, (MACROBID) 100 MG capsule Take 1 capsule (100 mg total) by mouth 2 (two) times daily. 14 capsule 0   omeprazole (PRILOSEC) 40 MG capsule TAKE (1) CAPSULE BY MOUTH ONCE DAILY FOR ACID REFLUX. 90 capsule 0   ondansetron (ZOFRAN)  4 MG tablet TAKE (1) TABLET BY MOUTH EVERY EIGHT HOURS AS NEEDED. 20 tablet 0   rosuvastatin (CRESTOR) 5 MG tablet Take 1 tablet (5 mg total) by mouth daily. 90 tablet 3   silver sulfADIAZINE (SILVADENE) 1 % cream Apply 1 Application topically daily. 50 g 0   solifenacin (VESICARE) 5 MG tablet Take 1 tablet (5 mg total) by mouth daily. 90 tablet 1   tiZANidine (ZANAFLEX) 2 MG tablet TAKE 2 TABLETS (4 MG) BY MOUTH ONCE  EVERY 8 HOURS AS NEEDED FOR MUSCLE SPASMS. 180 tablet 3   traZODone (DESYREL) 150 MG tablet Take 2 tablets (300 mg total) by mouth at bedtime. 60 tablet 2   Vitamin D, Ergocalciferol, (DRISDOL) 1.25 MG (50000 UNIT) CAPS capsule Take 1 capsule (50,000 Units total) by mouth every 7 (seven) days. 12 capsule 0   No current facility-administered medications for this visit.     Musculoskeletal: Strength & Muscle Tone: n/a Gait & Station: n/a Patient leans: N/A  Psychiatric Specialty Exam: Review of Systems  Musculoskeletal:  Positive for arthralgias.  Psychiatric/Behavioral:  The patient is nervous/anxious.   All other systems reviewed and are negative.   There were no vitals taken for this visit.There is no height or weight on file to calculate BMI.  General Appearance: n/a  Eye Contact:  NA  Speech:  Clear and Coherent  Volume:  Normal  Mood:  Anxious  Affect:  NA  Thought Process:  Goal Directed  Orientation:  Full (Time, Place, and Person)  Thought Content: Obsessions and Rumination   Suicidal Thoughts:  No  Homicidal Thoughts:  No  Memory:  Immediate;   Good Recent;   Good Remote;   Fair  Judgement:  Fair  Insight:  Fair  Psychomotor Activity:  Normal  Concentration:  Concentration: Fair and Attention Span: Fair  Recall:  [employer] of Knowledge: Fair  Language: Good  Akathisia:  No  Handed:  Right  AIMS (if indicated): not done  Assets:  Communication Skills Desire for Improvement Resilience Social Support Talents/Skills  ADL's:  Intact  Cognition: WNL  Sleep:  Good   Screenings: GAD-7    Flowsheet Row Office Visit from 04/07/2023 in [facility] Office Visit from 09/23/2022 in [facility]  Total GAD-7 Score 5 5      PHQ2-9    Flowsheet Row Office Visit from 04/07/2023 in [facility] [place] [facility] Office Visit from 09/23/2022 in [facility] Video Visit from 01/10/2022  in [facility] [place] Video Visit from 10/11/2021 in [facility] [place] Video Visit from 06/24/2021 in [facility] Total Score 3 4 1 1  0  PHQ-9 Total Score -- 14 -- -- --      Flowsheet Row Video Visit from 01/10/2022 in [facility] [place] Video Visit from 10/11/2021 in [facility] [place] Video Visit from 06/24/2021 in [facility] at [place]  C-SSRS RISK CATEGORY No Risk No Risk No Risk        Assessment and Plan: This patient is a 63 year old female with a history of PTSD and anxiety.  She claims that she is overthinking things in an obsessional way so we will switch Lexapro to 40 mg of Prozac daily for depression and obsessional thoughts.  She will continue Lamictal 100 mg twice daily for mood stabilization,  Xanax 1 mg up to 4 times daily as needed for anxiety and trazodone 300 mg at bedtime for sleep.  She will return to see me in 6 weeks  Collaboration of Care: Collaboration of Care: Primary Care Provider AEB notes are shared with PCP on the epic system  Patient/Guardian was advised Release of Information must be obtained prior to any record release in order to collaborate their care with an outside provider. Patient/Guardian was advised if they have not already done so to contact the registration department to sign all necessary forms in order for [place] to release information regarding their care.   Consent: Patient/Guardian gives verbal consent for treatment and assignment of benefits for services provided during this visit. Patient/Guardian expressed understanding and agreed to proceed.    Diannia Ruder, MD 07/18/2023, 11:33 AM

## 2023-07-24 ENCOUNTER — Other Ambulatory Visit: Payer: Self-pay | Admitting: Family Medicine

## 2023-08-10 ENCOUNTER — Other Ambulatory Visit: Payer: Self-pay | Admitting: Family Medicine

## 2023-08-11 ENCOUNTER — Other Ambulatory Visit: Payer: Self-pay

## 2023-08-11 MED ORDER — IBUPROFEN 800 MG PO TABS: 800.0000 mg | ORAL_TABLET | Freq: Three times a day (TID) | ORAL | 1 refills | Status: DC

## 2023-09-05 ENCOUNTER — Other Ambulatory Visit: Payer: Self-pay | Admitting: Family Medicine

## 2023-09-12 ENCOUNTER — Other Ambulatory Visit: Payer: Self-pay | Admitting: Family Medicine

## 2023-09-18 ENCOUNTER — Other Ambulatory Visit: Payer: Self-pay | Admitting: Family Medicine

## 2023-09-19 ENCOUNTER — Other Ambulatory Visit: Payer: Self-pay

## 2023-09-19 MED ORDER — FLUTICASONE PROPIONATE 50 MCG/ACT NA SUSP: NASAL | 1 refills | Status: DC

## 2023-09-19 MED ORDER — IBUPROFEN 800 MG PO TABS: 800.0000 mg | ORAL_TABLET | Freq: Three times a day (TID) | ORAL | 1 refills | Status: DC

## 2023-09-25 ENCOUNTER — Other Ambulatory Visit: Payer: Self-pay | Admitting: Family Medicine

## 2023-09-25 DIAGNOSIS — M797 Fibromyalgia: Secondary | ICD-10-CM

## 2023-09-26 ENCOUNTER — Other Ambulatory Visit: Payer: Self-pay | Admitting: Family Medicine

## 2023-10-10 ENCOUNTER — Telehealth: Payer: Self-pay

## 2023-10-10 NOTE — Telephone Encounter (Signed)
 Copied from CRM [id number]. Topic: Clinical - Prescription Issue >> Oct 10, 2023 10:53 AM Turkey B wrote: Pt called in says her med for diabetes, isnt covered. She doesn't know the name of it. She wants to know why it isnt covered?

## 2023-10-11 ENCOUNTER — Other Ambulatory Visit: Payer: Self-pay

## 2023-10-11 ENCOUNTER — Other Ambulatory Visit (HOSPITAL_COMMUNITY): Payer: Self-pay | Admitting: Psychiatry

## 2023-10-11 DIAGNOSIS — R7303 Prediabetes: Secondary | ICD-10-CM

## 2023-10-11 MED ORDER — METFORMIN HCL 500 MG PO TABS: 500.0000 mg | ORAL_TABLET | Freq: Two times a day (BID) | ORAL | 0 refills | Status: DC

## 2023-10-12 ENCOUNTER — Other Ambulatory Visit: Payer: Self-pay | Admitting: Family Medicine

## 2023-10-12 ENCOUNTER — Other Ambulatory Visit (HOSPITAL_COMMUNITY): Payer: Self-pay | Admitting: Psychiatry

## 2023-10-12 DIAGNOSIS — R062 Wheezing: Secondary | ICD-10-CM

## 2023-10-13 ENCOUNTER — Ambulatory Visit: Payer: Self-pay

## 2023-10-13 NOTE — Telephone Encounter (Signed)
 FYI Only or Action Required?: FYI only for provider.  Patient was last seen in primary care on 04/07/2023 by Mauro Elveria BROCKS, NP. Called Nurse Triage reporting New Med Request. Symptoms began ongoing. Interventions attempted: Dietary changes. Symptoms are: unchanged.  Triage Disposition: See PCP When Office is Open (Within 3 Days)  Patient/caregiver understands and will follow disposition?:yes- patient will call back to schedule    Reason for Disposition  Prescription request for new medicine (not a refill)  Answer Assessment - Initial Assessment Questions 1. NAME of MEDICINE: What medicine(s) are you calling about?     Ozempic 2. QUESTION: What is your question? (e.g., double dose of medicine, side effect)     Can she try Ozempic for weight loss 3. PRESCRIBER: Who prescribed the medicine? Reason: if prescribed by specialist, call should be referred to that group.     PCP 4. SYMPTOMS: Do you have any symptoms? If Yes, ask: What symptoms are you having?  How bad are the symptoms (e.g., mild, moderate, severe)     Knee pain- wants to lose weight  Advised patient she needs appointment to discuss the best option for her- she will call office back to schedule. (She has to get transportation)  Protocols used: Medication Question Call-A-AH

## 2023-10-13 NOTE — Telephone Encounter (Addendum)
 10/13/23 13:22 2nd attempt. No answer  10/13/23 12:13 1st attempt. [email or web address]  Copied from CRM [id number]. Topic: Clinical - Medication Question >> Oct 13, 2023 10:45 AM Willma SAUNDERS wrote: Reason for CRM: Patient calling in enquiring if she can be prescribed ozempic for weight loss and if her insurance will cover it.  Would like a callback to discuss her options.  Patient can be reached at [phone]

## 2023-10-18 ENCOUNTER — Encounter (HOSPITAL_COMMUNITY): Payer: Self-pay | Admitting: Psychiatry

## 2023-10-18 ENCOUNTER — Telehealth (HOSPITAL_COMMUNITY): Admitting: Psychiatry

## 2023-10-18 DIAGNOSIS — F319 Bipolar disorder, unspecified: Secondary | ICD-10-CM

## 2023-10-18 DIAGNOSIS — F4310 Post-traumatic stress disorder, unspecified: Secondary | ICD-10-CM | POA: Diagnosis not present

## 2023-10-18 MED ORDER — TRAZODONE HCL 150 MG PO TABS: 300.0000 mg | ORAL_TABLET | Freq: Every day | ORAL | 2 refills | Status: DC

## 2023-10-18 MED ORDER — LAMOTRIGINE 100 MG PO TABS: 100.0000 mg | ORAL_TABLET | Freq: Two times a day (BID) | ORAL | 3 refills | Status: DC

## 2023-10-18 MED ORDER — FLUOXETINE HCL 20 MG PO CAPS: 20.0000 mg | ORAL_CAPSULE | Freq: Every day | ORAL | 2 refills | Status: DC

## 2023-10-18 MED ORDER — ALPRAZOLAM 1 MG PO TABS: ORAL_TABLET | ORAL | 2 refills | Status: DC

## 2023-10-18 NOTE — Progress Notes (Signed)
 Virtual Visit via Telephone Note  I connected with Annette Galvan on 10/18/23 at  8:40 AM EDT by telephone and verified that I am speaking with the correct person using two identifiers.  Location: Patient: home Provider: office   I discussed the limitations, risks, security and privacy concerns of performing an evaluation and management service by telephone and the availability of in person appointments. I also discussed with the patient that there may be a patient responsible charge related to this service. The patient expressed understanding and agreed to proceed.      I discussed the assessment and treatment plan with the patient. The patient was provided an opportunity to ask questions and all were answered. The patient agreed with the plan and demonstrated an understanding of the instructions.   The patient was advised to call back or seek an in-person evaluation if the symptoms worsen or if the condition fails to improve as anticipated.  I provided 20 minutes of non-face-to-face time during this encounter.   [name], MD  [facility] MD/PA/NP OP Progress Note  10/18/2023 8:56 AM Annette Galvan  MRN:  [id number]  Chief Complaint:  Chief Complaint  Patient presents with   Anxiety   Depression   Follow-up   HPI: This patient is a 63 year old widowed white female who lives alone in [place]. She does have a worker who comes in several times a week. She is on disability.   The patient returns for follow-up after 3 months regarding her depression anxiety and PTSD.  Last time we switched from Lexapro  to Prozac  because of her overthinking.  The Prozac  40 mg gave her stomach aches but she is taking it every other day and she states that it is working well.  She is no longer overthinking and is denying any depression.  She claims that she is always moving her mouth and grinding her teeth and that her primary doctor think she has tardive dyskinesia.  However she states that this started  back in high school before she was on any psychiatric medicines also she is not on any type of psychiatric medicines that would cause tardive dyskinesia.  I explained that I would need to see her in person to see exactly what is going on.  She states that the Xanax  continues to help her anxiety and she is sleeping well.  She denies any thoughts of self-harm or suicide Visit Diagnosis:    ICD-10-CM   1. Bipolar 1 disorder (HCC)  F31.9     2. PTSD (post-traumatic stress disorder)  F43.10       Past Psychiatric History: History of depression, anxiety and a remote history of substance abuse.  Psychiatric admissions in her 28s and 30s  Past Medical History:  Past Medical History:  Diagnosis Date   Arthritis    needs 2 knee replacements; also has left shoulder and left hip pain   Back pain, chronic    Bilateral chronic knee pain    Bipolar 1 disorder (HCC)    Bulging disc    Fibromyalgia    GERD (gastroesophageal reflux disease)    HSV-2 (herpes simplex virus 2) infection 2013   Hx of trichomoniasis    Itching 06/26/2013   Leg pain    Trichimoniasis 06/26/2013   UTI (lower urinary tract infection) 10/12/2012   Vaginal discharge 06/26/2013   Vaginal irritation 12/05/2013   Vaginal odor 12/28/2012   Had discharge +clue   Yeast infection 02/04/2013    Past Surgical History:  Procedure  Laterality Date   ABDOMINAL HYSTERECTOMY     total   BREAST ENHANCEMENT SURGERY Right    COLONOSCOPY WITH PROPOFOL  N/A 12/07/2015   Procedure: COLONOSCOPY WITH PROPOFOL ;  Surgeon: Lamar CHRISTELLA Hollingshead, MD;  Location: [facility];  Service: Endoscopy;  Laterality: N/A;  915   TUBAL LIGATION      Family Psychiatric History: See below  Family History:  Family History  Problem Relation Age of Onset   Hypertension Mother    Diabetes Mother    Depression Mother    Hyperlipidemia Mother    Fibromyalgia Mother    Arthritis Mother    Cancer Father        bone   Hyperlipidemia Father    Hypertension Father     Bipolar disorder Father    Depression Sister    Hyperlipidemia Sister    Fibromyalgia Sister    Fibromyalgia Sister    Bipolar disorder Other    Drug abuse Other    Alcohol abuse Other    Hypertension Brother    Other Brother        shingles; back problems   Diabetes Paternal Grandmother    Alzheimer's disease Maternal Grandmother    Obesity Daughter    Other Daughter        back problems   Bipolar disorder Daughter    Depression Daughter    Other Daughter        on pain meds   Colon cancer Neg Hx     Social History:  Social History   Socioeconomic History   Marital status: Widowed    Spouse name: Not on file   Number of children: Not on file   Years of education: Not on file   Highest education level: Not on file  Occupational History   Occupation: Disabled  Tobacco Use   Smoking status: Light Smoker    Current packs/day: 0.25    Average packs/day: 0.3 packs/day for 25.0 years (6.3 ttl pk-yrs)    Types: Cigarettes   Smokeless tobacco: Never   Tobacco comments:    2 cigarettes daily  Vaping Use   Vaping status: Never Used  Substance and Sexual Activity   Alcohol use: Yes    Alcohol/week: 0.0 standard drinks of alcohol    Comment: occasional   Drug use: No    Comment: cocaine, clean for 4.5 years as of 12/04/2015   Sexual activity: Yes    Birth control/protection: Surgical    Comment: hysterectomy  Other Topics Concern   Not on file  Social History Narrative   Not on file   Social Drivers of Health   Financial Resource Strain: Low Risk  (08/20/2020)   Overall Financial Resource Strain (CARDIA)    Difficulty of Paying Living Expenses: Not hard at all  Food Insecurity: No Food Insecurity (08/20/2020)   Hunger Vital Sign    Worried About Running Out of Food in the Last Year: Never true    Ran Out of Food in the Last Year: Never true  Transportation Needs: No Transportation Needs (08/20/2020)   PRAPARE - Administrator, Civil Service (Medical):  No    Lack of Transportation (Non-Medical): No  Physical Activity: Inactive (08/20/2020)   Exercise Vital Sign    Days of Exercise per Week: 0 days    Minutes of Exercise per Session: 0 min  Stress: No Stress Concern Present (08/20/2020)   [employer] of Occupational Health - Occupational Stress Questionnaire    Feeling of Stress :  Not at all  Social Connections: Socially Isolated (08/20/2020)   Social Connection and Isolation Panel    Frequency of Communication with Friends and Family: More than three times a week    Frequency of Social Gatherings with Friends and Family: Once a week    Attends Religious Services: Never    Database administrator or Organizations: No    Attends Banker Meetings: Never    Marital Status: Widowed    Allergies:  Allergies  Allergen Reactions   Amoxicillin  Hives   Hydrocodone  Nausea And Vomiting   Latex Swelling    Ankle Area   Lisinopril  Cough   Ampicillin Hives and Nausea And Vomiting    Metabolic Disorder Labs: Lab Results  Component Value Date   HGBA1C 5.7 (H) 05/12/2022   MPG 117 05/18/2020   MPG 108 12/02/2019   No results found for: PROLACTIN Lab Results  Component Value Date   CHOL 178 05/12/2022   TRIG 147 05/12/2022   HDL 60 05/12/2022   CHOLHDL 3.0 05/12/2022   VLDL 23 08/12/2016   LDLCALC 93 05/12/2022   LDLCALC 74 10/25/2021   Lab Results  Component Value Date   TSH 0.601 05/12/2022   TSH 0.520 05/25/2021    Therapeutic Level Labs: No results found for: LITHIUM No results found for: VALPROATE No results found for: CBMZ  Current Medications: Current Outpatient Medications  Medication Sig Dispense Refill   FLUoxetine  (PROZAC ) 20 MG capsule Take 1 capsule (20 mg total) by mouth daily. 30 capsule 2   acetaminophen  (TYLENOL ) 500 MG tablet Take 1 tablet (500 mg total) by mouth every 6 (six) hours as needed. 30 tablet 0   acyclovir  ointment (ZOVIRAX ) 5 % Apply 1 Application topically 5 (five)  times daily. 30 g 0   albuterol  (VENTOLIN  HFA) 108 (90 Base) MCG/ACT inhaler INHALE TWO PUFFS INTO THE LUNGS EVERY 6 HOURS AS NEEDED 8.5 g 0   ALPRAZolam  (XANAX ) 1 MG tablet TAKE (1) TABLET BY MOUTH (4) TIMES DAILY AS NEEDED FOR ANXIETY. 120 tablet 2   cephALEXin  (KEFLEX ) 500 MG capsule Take 1 capsule (500 mg total) by mouth 3 (three) times daily. For 5 days 15 capsule 0   cetirizine  (ZYRTEC ) 10 MG tablet Take 1 tablet (10 mg total) by mouth daily. 30 tablet 2   chlorhexidine  (PERIDEX ) 0.12 % solution SMARTSIG:By Mouth     diclofenac  Sodium (VOLTAREN ) 1 % GEL APPLY 2 GRAMS TO THE AFFECTED AREAS FOUR TIMES A DAY. 100 g 0   fluticasone  (FLONASE ) 50 MCG/ACT nasal spray PLACE 2 SPRAYS IN EACH NOSTRIL ONCE DAILY AS NEEDED FOR HEAD CONGESTION AS DIRECTED. 16 g 1   gabapentin  (NEURONTIN ) 300 MG capsule TAKE 2 CAPSULES BY MOUTH 3 TIMES DAILY. 180 capsule 11   ibuprofen  (ADVIL ) 800 MG tablet Take 1 tablet (800 mg total) by mouth 3 (three) times daily. 30 tablet 1   lamoTRIgine  (LAMICTAL ) 100 MG tablet Take 1 tablet (100 mg total) by mouth 2 (two) times daily. 60 tablet 3   losartan  (COZAAR ) 50 MG tablet Take 1 tablet (50 mg total) by mouth daily. 100 tablet 3   metFORMIN  (GLUCOPHAGE ) 500 MG tablet Take 1 tablet (500 mg total) by mouth 2 (two) times daily with a meal. 60 tablet 0   mometasone  (NASONEX ) 50 MCG/ACT nasal spray Place 2 sprays into the nose daily. 1 each 2   nitrofurantoin , macrocrystal-monohydrate, (MACROBID ) 100 MG capsule Take 1 capsule (100 mg total) by mouth 2 (two) times daily. 14  capsule 0   omeprazole  (PRILOSEC) 40 MG capsule TAKE (1) CAPSULE BY MOUTH ONCE DAILY FOR ACID REFLUX. 90 capsule 0   ondansetron  (ZOFRAN ) 4 MG tablet TAKE (1) TABLET BY MOUTH EVERY EIGHT HOURS AS NEEDED. 20 tablet 0   rosuvastatin  (CRESTOR ) 5 MG tablet Take 1 tablet (5 mg total) by mouth daily. 90 tablet 3   solifenacin  (VESICARE ) 5 MG tablet Take 1 tablet (5 mg total) by mouth daily. 90 tablet 1   SSD 1 % cream  APPLY 1 APPLICATION TOPICALLY EVERY DAY 50 g 0   tiZANidine  (ZANAFLEX ) 2 MG tablet TAKE 2 TABLETS (4 MG) BY MOUTH ONCE EVERY 8 HOURS AS NEEDED FOR MUSCLE SPASMS. 180 tablet 3   traZODone  (DESYREL ) 150 MG tablet Take 2 tablets (300 mg total) by mouth at bedtime. 60 tablet 2   Vitamin D , Ergocalciferol , (DRISDOL ) 1.25 MG (50000 UNIT) CAPS capsule Take 1 capsule (50,000 Units total) by mouth every 7 (seven) days. 12 capsule 0   No current facility-administered medications for this visit.     Musculoskeletal: Strength & Muscle Tone: na- Gait & Station: na Patient leans: N/A  Psychiatric Specialty Exam: Review of Systems  Musculoskeletal:  Positive for arthralgias.  All other systems reviewed and are negative.   There were no vitals taken for this visit.There is no height or weight on file to calculate BMI.  General Appearance: NA  Eye Contact:  NA  Speech:  Clear and Coherent  Volume:  Normal  Mood:  Euthymic  Affect:  NA  Thought Process:  Goal Directed  Orientation:  Full (Time, Place, and Person)  Thought Content: WDL   Suicidal Thoughts:  No  Homicidal Thoughts:  No  Memory:  Immediate;   Good Recent;   Good Remote;   NA  Judgement:  Good  Insight:  Fair  Psychomotor Activity:  NA  Concentration:  Concentration: Good and Attention Span: Good  Recall:  Good  Fund of Knowledge: Good  Language: Good  Akathisia:  No  Handed:  Right  AIMS (if indicated): not done  Assets:  Communication Skills Desire for Improvement Resilience Social Support  ADL's:  Intact  Cognition: WNL  Sleep:  Good   Screenings: GAD-7    Flowsheet Row Office Visit from 04/07/2023 in [facility] Office Visit from 09/23/2022 in [facility]  Total GAD-7 Score 5 5   PHQ2-9    Flowsheet Row Office Visit from 04/07/2023 in [facility] Office Visit from 09/23/2022 in [facility] [place] [facility] Video Visit from  01/10/2022 in [facility] at [place] Video Visit from 10/11/2021 in [facility] at [place] Video Visit from 06/24/2021 in [facility] at [facility] Total Score 3 4 1 1  0  PHQ-9 Total Score -- 14 -- -- --   Flowsheet Row Video Visit from 01/10/2022 in [facility] at [place] Video Visit from 10/11/2021 in [facility] at [place] Video Visit from 06/24/2021 in [facility]patient [facility] at [place]  C-SSRS RISK CATEGORY No Risk No Risk No Risk     Assessment and Plan: This patient is a 63 year old female with a history of PTSD and anxiety.  She does find the Prozac  helpful but the 40 mg might be too much so we will drop to 20 mg daily.  She will continue Lamictal  100 mg twice daily for mood stabilization,  Xanax  1 mg up to 4 times daily as needed for anxiety and trazodone  300 mg at bedtime for sleep.  She will return to see me in 3 months hopefully in person so we can assess her abnormal mouth movements  Collaboration of Care: Collaboration of Care: Primary Care Provider AEB notes are shared with PCP on the epic system  Patient/Guardian was advised Release of Information must be obtained prior to any record release in order to collaborate their care with an outside provider. Patient/Guardian was advised if they have not already done so to contact the registration department to sign all necessary forms in order for us  to release information regarding their care.   Consent: Patient/Guardian gives verbal consent for treatment and assignment of benefits for services provided during this visit. Patient/Guardian expressed understanding and agreed to proceed.    [name], MD 10/18/2023, 8:56 AM

## 2023-11-09 ENCOUNTER — Other Ambulatory Visit: Payer: Self-pay | Admitting: Family Medicine

## 2023-11-09 ENCOUNTER — Other Ambulatory Visit: Payer: Self-pay | Admitting: Nurse Practitioner

## 2023-11-09 MED ORDER — OMEPRAZOLE 40 MG PO CPDR: DELAYED_RELEASE_CAPSULE | ORAL | 0 refills | Status: AC

## 2023-11-13 ENCOUNTER — Other Ambulatory Visit: Payer: Self-pay | Admitting: Family Medicine

## 2023-11-13 DIAGNOSIS — R7303 Prediabetes: Secondary | ICD-10-CM

## 2023-11-14 ENCOUNTER — Other Ambulatory Visit: Payer: Self-pay | Admitting: Nurse Practitioner

## 2023-11-14 ENCOUNTER — Other Ambulatory Visit: Payer: Self-pay

## 2023-11-14 DIAGNOSIS — R062 Wheezing: Secondary | ICD-10-CM

## 2023-11-14 DIAGNOSIS — R7303 Prediabetes: Secondary | ICD-10-CM

## 2023-11-14 MED ORDER — METFORMIN HCL 500 MG PO TABS: 500.0000 mg | ORAL_TABLET | Freq: Two times a day (BID) | ORAL | 0 refills | Status: DC

## 2023-11-28 ENCOUNTER — Encounter: Payer: Self-pay | Admitting: Family Medicine

## 2023-11-28 ENCOUNTER — Other Ambulatory Visit (HOSPITAL_COMMUNITY): Payer: Self-pay

## 2023-11-28 ENCOUNTER — Ambulatory Visit: Admitting: Family Medicine

## 2023-11-28 DIAGNOSIS — R7303 Prediabetes: Secondary | ICD-10-CM | POA: Diagnosis not present

## 2023-11-28 DIAGNOSIS — Z130 Encounter for screening for diseases of the blood and blood-forming organs and certain disorders involving the immune mechanism: Secondary | ICD-10-CM | POA: Diagnosis not present

## 2023-11-28 DIAGNOSIS — E785 Hyperlipidemia, unspecified: Secondary | ICD-10-CM

## 2023-11-28 DIAGNOSIS — E0590 Thyrotoxicosis, unspecified without thyrotoxic crisis or storm: Secondary | ICD-10-CM

## 2023-11-28 DIAGNOSIS — E559 Vitamin D deficiency, unspecified: Secondary | ICD-10-CM | POA: Diagnosis not present

## 2023-11-28 DIAGNOSIS — R062 Wheezing: Secondary | ICD-10-CM

## 2023-11-28 DIAGNOSIS — M170 Bilateral primary osteoarthritis of knee: Secondary | ICD-10-CM

## 2023-11-28 DIAGNOSIS — I10 Essential (primary) hypertension: Secondary | ICD-10-CM

## 2023-11-28 MED ORDER — ALBUTEROL SULFATE HFA 108 (90 BASE) MCG/ACT IN AERS: 1.0000 | INHALATION_SPRAY | RESPIRATORY_TRACT | 0 refills | Status: DC | PRN

## 2023-11-28 MED ORDER — FLUTICASONE PROPIONATE 50 MCG/ACT NA SUSP: NASAL | 1 refills | Status: DC

## 2023-11-28 MED ORDER — IBUPROFEN 800 MG PO TABS
800.0000 mg | ORAL_TABLET | Freq: Three times a day (TID) | ORAL | 1 refills | Status: DC
Start: 2023-11-28 — End: 2023-12-26

## 2023-11-28 MED ORDER — DICLOFENAC SODIUM 1 % EX GEL: 2.0000 g | Freq: Four times a day (QID) | CUTANEOUS | 0 refills | Status: DC

## 2023-11-28 MED ORDER — SEMAGLUTIDE-WEIGHT MANAGEMENT 0.25 MG/0.5ML ~~LOC~~ SOAJ: 0.2500 mg | SUBCUTANEOUS | 0 refills | Status: AC

## 2023-11-28 MED ORDER — SILVER SULFADIAZINE 1 % EX CREA: TOPICAL_CREAM | Freq: Every day | CUTANEOUS | 0 refills | Status: DC

## 2023-11-28 NOTE — Patient Instructions (Addendum)
 Wegovy  sent in.  Labs at your convenience.  Take care  Dr. Bluford

## 2023-11-28 NOTE — Assessment & Plan Note (Signed)
 Ibuprofen  refilled.  Needs weight loss.

## 2023-11-28 NOTE — Progress Notes (Signed)
 Subjective:  Patient ID: Annette Galvan, female    DOB: [birth year]  Age: 63 y.o. MRN: [id number]  CC:   Chief Complaint  Patient presents with   Obesity    Would like prescription for GLP-1    HPI:  63 year old female with the below mentioned medical issues presents for the above.  Patient reports that she needs help losing weight.  States that she is unable to exercise due to severe knee pain. Needs knee replacement per her report but needs to lose weight. Patient inquiring about GLP-1 medication.    Patient Active Problem List   Diagnosis Date Noted   Overactive bladder 09/25/2022   Vitamin D  deficiency 05/27/2021   Hyperlipidemia 05/27/2021   Seasonal allergies 05/25/2021   Subclinical hyperthyroidism 08/14/2015   GERD (gastroesophageal reflux disease) 11/12/2014   OA (osteoarthritis) of knee 05/17/2013   Sleep apnea 09/13/2012   Tobacco use 09/13/2012   Hypertension 02/10/2012   Fibromyalgia 10/11/2011   Morbid obesity (HCC) 08/29/2011   Prediabetes 08/29/2011   Bipolar 1 disorder (HCC) 08/29/2011   DDD (degenerative disc disease), lumbar 04/04/2007    Social Hx   Social History   Socioeconomic History   Marital status: Widowed    Spouse name: Not on file   Number of children: Not on file   Years of education: Not on file   Highest education level: Not on file  Occupational History   Occupation: Disabled  Tobacco Use   Smoking status: Light Smoker    Current packs/day: 0.25    Average packs/day: 0.3 packs/day for 25.0 years (6.3 ttl pk-yrs)    Types: Cigarettes   Smokeless tobacco: Never   Tobacco comments:    2 cigarettes daily  Vaping Use   Vaping status: Never Used  Substance and Sexual Activity   Alcohol use: Yes    Alcohol/week: 0.0 standard drinks of alcohol    Comment: occasional   Drug use: No    Comment: cocaine, clean for 4.5 years as of 12/04/2015   Sexual activity: Yes    Birth control/protection: Surgical    Comment: hysterectomy   Other Topics Concern   Not on file  Social History Narrative   Not on file   Social Drivers of Health   Financial Resource Strain: Low Risk  (08/20/2020)   Overall Financial Resource Strain (CARDIA)    Difficulty of Paying Living Expenses: Not hard at all  Food Insecurity: No Food Insecurity (08/20/2020)   Hunger Vital Sign    Worried About Running Out of Food in the Last Year: Never true    Ran Out of Food in the Last Year: Never true  Transportation Needs: No Transportation Needs (08/20/2020)   PRAPARE - Administrator, Civil Service (Medical): No    Lack of Transportation (Non-Medical): No  Physical Activity: Inactive (08/20/2020)   Exercise Vital Sign    Days of Exercise per Week: 0 days    Minutes of Exercise per Session: 0 min  Stress: No Stress Concern Present (08/20/2020)   [employer] of Occupational Health - Occupational Stress Questionnaire    Feeling of Stress : Not at all  Social Connections: Socially Isolated (08/20/2020)   Social Connection and Isolation Panel    Frequency of Communication with Friends and Family: More than three times a week    Frequency of Social Gatherings with Friends and Family: Once a week    Attends Religious Services: Never    Database administrator or  Organizations: No    Attends Banker Meetings: Never    Marital Status: Widowed    Review of Systems Per HPI  Objective:  BP (!) 142/84   Pulse 74   Temp (!) 97.2 F (36.2 C)   Ht 5' 4 (1.626 m)   Wt 270 lb (122.5 kg)   SpO2 96%   BMI 46.35 kg/m      11/28/2023   10:45 AM 11/28/2023   10:14 AM 04/07/2023   11:28 AM  BP/Weight  Systolic BP 142 154 152  Diastolic BP 84 90 86  Wt. (Lbs) 270 207   BMI 46.35 kg/m2 35.53 kg/m2     Physical Exam Vitals and nursing note reviewed.  Constitutional:      General: She is not in acute distress.    Appearance: Normal appearance. She is obese.  HENT:     Head: Normocephalic and atraumatic.  Eyes:      General:        Right eye: No discharge.        Left eye: No discharge.     Conjunctiva/sclera: Conjunctivae normal.  Cardiovascular:     Rate and Rhythm: Normal rate and regular rhythm.  Pulmonary:     Effort: Pulmonary effort is normal.     Breath sounds: Normal breath sounds.  Neurological:     Mental Status: She is alert.     Lab Results  Component Value Date   WBC 7.6 05/12/2022   HGB 13.9 05/12/2022   HCT 42.8 05/12/2022   PLT 330 05/12/2022   GLUCOSE 107 (H) 05/12/2022   CHOL 178 05/12/2022   TRIG 147 05/12/2022   HDL 60 05/12/2022   LDLCALC 93 05/12/2022   ALT 12 05/12/2022   AST 17 05/12/2022   NA 141 05/12/2022   K 4.3 05/12/2022   CL 102 05/12/2022   CREATININE 0.82 05/12/2022   BUN 15 05/12/2022   CO2 22 05/12/2022   TSH 0.601 05/12/2022   HGBA1C 5.7 (H) 05/12/2022     Assessment & Plan:  Morbid obesity (HCC) Assessment & Plan: I do not believe that her insurance will cover but I will send in medication to see if we can help with weight loss.  Sending in Wegovy .  Orders: -     Semaglutide -Weight Management; Inject 0.25 mg into the skin once a week.  Dispense: 2 mL; Refill: 0  Wheezing -     Albuterol  Sulfate HFA; Inhale 1-2 puffs into the lungs every 4 (four) hours as needed for wheezing or shortness of breath.  Dispense: 8.5 g; Refill: 0  Prediabetes -     Hemoglobin A1c  Subclinical hyperthyroidism Assessment & Plan: Labs today.  Orders: -     TSH -     T4, free -     T3, free  Vitamin D  deficiency -     VITAMIN D  25 Hydroxy (Vit-D Deficiency, Fractures)  Primary hypertension Assessment & Plan: Mildly elevated on repeat.  Obesity definitely playing a role.  Needs weight loss.  Orders: -     CMP14+EGFR  Hyperlipidemia, unspecified hyperlipidemia type -     Lipid panel  Screening for deficiency anemia -     CBC  Primary osteoarthritis of both knees Assessment & Plan: Ibuprofen  refilled.  Needs weight loss.  Orders: -      Diclofenac  Sodium; Apply 2 g topically 4 (four) times daily.  Dispense: 100 g; Refill: 0 -     Ibuprofen ; Take 1 tablet (800  mg total) by mouth 3 (three) times daily.  Dispense: 30 tablet; Refill: 1  Other orders -     Fluticasone  Propionate; PLACE 2 SPRAYS IN EACH NOSTRIL ONCE DAILY AS NEEDED FOR HEAD CONGESTION AS DIRECTED.  Dispense: 16 g; Refill: 1 -     Silver  sulfADIAZINE ; Apply topically daily.  Dispense: 50 g; Refill: 0   Vega Withrow DO [facility]

## 2023-11-28 NOTE — Assessment & Plan Note (Signed)
 Mildly elevated on repeat.  Obesity definitely playing a role.  Needs weight loss.

## 2023-11-28 NOTE — Assessment & Plan Note (Signed)
 I do not believe that her insurance will cover but I will send in medication to see if we can help with weight loss.  Sending in Wegovy .

## 2023-11-28 NOTE — Assessment & Plan Note (Signed)
 Labs today

## 2023-11-29 ENCOUNTER — Telehealth: Payer: Self-pay | Admitting: Pharmacy Technician

## 2023-11-29 ENCOUNTER — Ambulatory Visit: Payer: Self-pay | Admitting: Family Medicine

## 2023-11-29 LAB — LIPID PANEL
Chol/HDL Ratio: 2.9 ratio (ref 0.0–4.4)
Cholesterol, Total: 183 mg/dL (ref 100–199)
HDL: 63 mg/dL (ref >39)
LDL Chol Calc (NIH): 90 mg/dL (ref 0–99)
Triglycerides: 176 mg/dL — ABNORMAL HIGH (ref 0–149)
VLDL Cholesterol Cal: 30 mg/dL (ref 5–40)

## 2023-11-29 LAB — CBC
Hematocrit: 39.4 % (ref 34.0–46.6)
Hemoglobin: 12.1 g/dL (ref 11.1–15.9)
MCH: 29.6 pg (ref 26.6–33.0)
MCHC: 30.7 g/dL — ABNORMAL LOW (ref 31.5–35.7)
MCV: 96 fL (ref 79–97)
Platelets: 248 x10E3/uL (ref 150–450)
RBC: 4.09 x10E6/uL (ref 3.77–5.28)
RDW: 13.7 % (ref 11.7–15.4)
WBC: 6.7 x10E3/uL (ref 3.4–10.8)

## 2023-11-29 LAB — CMP14+EGFR
ALT: 15 IU/L (ref 0–32)
AST: 19 IU/L (ref 0–40)
Albumin: 4.2 g/dL (ref 3.9–4.9)
Alkaline Phosphatase: 91 IU/L (ref 44–121)
BUN/Creatinine Ratio: 14 (ref 12–28)
BUN: 16 mg/dL (ref 8–27)
Bilirubin Total: 0.4 mg/dL (ref 0.0–1.2)
CO2: 25 mmol/L (ref 20–29)
Calcium: 9.5 mg/dL (ref 8.7–10.3)
Chloride: 103 mmol/L (ref 96–106)
Creatinine, Ser: 1.12 mg/dL — ABNORMAL HIGH (ref 0.57–1.00)
Globulin, Total: 3.1 g/dL (ref 1.5–4.5)
Glucose: 78 mg/dL (ref 70–99)
Potassium: 4.9 mmol/L (ref 3.5–5.2)
Sodium: 141 mmol/L (ref 134–144)
Total Protein: 7.3 g/dL (ref 6.0–8.5)
eGFR: 56 mL/min/1.73 — ABNORMAL LOW (ref >59)

## 2023-11-29 LAB — TSH: TSH: 1.69 u[IU]/mL (ref 0.450–4.500)

## 2023-11-29 LAB — VITAMIN D 25 HYDROXY (VIT D DEFICIENCY, FRACTURES): Vit D, 25-Hydroxy: 20.6 ng/mL — ABNORMAL LOW (ref 30.0–100.0)

## 2023-11-29 LAB — T4, FREE: Free T4: 1.22 ng/dL (ref 0.82–1.77)

## 2023-11-29 LAB — HEMOGLOBIN A1C
Est. average glucose Bld gHb Est-mCnc: 105 mg/dL
Hgb A1c MFr Bld: 5.3 % (ref 4.8–5.6)

## 2023-11-29 LAB — T3, FREE: T3, Free: 3.7 pg/mL (ref 2.0–4.4)

## 2023-11-29 NOTE — Telephone Encounter (Signed)
 Pharmacy Patient Advocate Encounter   Received notification from CoverMyMeds that prior authorization for Wegovy  0.25MG /0.5ML auto-injectors is required/requested.   Insurance verification completed.   The patient is insured through [employer] .   Drug is excluded from coverage when used solely for weight loss.  Does she have any of the following for cardiovascular risk reduction?  Does pt have evidence of any of the following: Myocardial infarction (MI), (2) Prior ischemic or hemorrhagic stroke, (3) Symptomatic peripheral arterial disease (PAD) as evidence by one of the following (a) Intermittent claudication with ankle-brachial index (ABI) less than 0.85 (at rest); (b) Peripheral arterial revascularization procedure; (c) Amputation due to atherosclerotic disease?

## 2023-12-06 ENCOUNTER — Other Ambulatory Visit: Payer: Self-pay

## 2023-12-06 DIAGNOSIS — R7989 Other specified abnormal findings of blood chemistry: Secondary | ICD-10-CM

## 2023-12-11 ENCOUNTER — Other Ambulatory Visit: Payer: Self-pay | Admitting: Family Medicine

## 2023-12-11 DIAGNOSIS — M170 Bilateral primary osteoarthritis of knee: Secondary | ICD-10-CM

## 2023-12-11 DIAGNOSIS — R7303 Prediabetes: Secondary | ICD-10-CM

## 2023-12-12 ENCOUNTER — Ambulatory Visit: Payer: Self-pay

## 2023-12-12 NOTE — Telephone Encounter (Signed)
 FYI Only or Action Required?: FYI only for provider. - Home care for pinworms at this time, will call back if worsening or new symptoms  Patient was last seen in primary care on 11/28/2023 by Cook, Jayce G, DO.  Called Nurse Triage reporting Anal Itching, little pieces of white rice in stool, and pinworms seen by home health worker.  Symptoms began several days ago.  Interventions attempted: Rest, hydration, or home remedies and Ice/heat application.  Symptoms are: gradually worsening.  Triage Disposition: Home Care  Patient/caregiver understands and will follow disposition?: Yes      Message from Tiffini S sent at 12/12/2023  9:52 AM EDT  Patient called about her stool- looks like white patches and anus area is itchy. Transferred call to triage nurse. Please called the patient back at [phone].     Reason for Disposition  Pinworms seen (white thread-like worm about size of a staple, moves)  Answer Assessment - Initial Assessment Questions 1. SYMPTOM:  What's the main symptom you're concerned about? (e.g., pain, itching, swelling, rash)     Started looking, little pieces of white rice, looked like something moving back in me when home health turned light out and used cell phone flashlight and looked at rectum, looked like something moving back into my rectum Red when itch When growing up took worm medicine every week 2. ONSET: When did the symptoms  start?     2 days ago 3. RECTAL PAIN: Do you have any pain around your rectum? How bad is the pain?  (Scale 0-10; or none, mild, moderate, severe)     Hurts when itch but relieves itching too 4. RECTAL ITCHING: Do you have any itching in this area? How bad is the itching?  (Scale 0-10; or none, mild, moderate, severe)     7-8/10 5. CONSTIPATION: Do you have constipation? If Yes, ask: How often do you have a bowel movement (BM)?  (Normal range: 3 times a day to every 3 days)  When was your last BM?        no 6. CAUSE: What do you think is causing the anus symptoms?     Thinking might be parasites, pretty sure, had in past 7. OTHER SYMPTOMS: Do you have any other symptoms?  (e.g., abdomen pain, fever, rectal bleeding, vomiting)     No diarrhea, abdominal pain, fever, rectal bleeding, vomiting  Can't get up and down steps unless critical, don't get out unless have to, gonna get a ramp soon  Answer Assessment - Initial Assessment Questions 1. PRESENCE OF PINWORMS: Have you seen any pinworms? If Yes, ask: What do the worms look like? How long are the worms?      Home health worker saw them Like pieces of white rice 2. SYMPTOMS: Do you have any other symptoms? (e.g., anal itching, anal redness, fever)     Anal itching, no fever, no sores or redness, red only when itching 3. ONSET: When did you first notice symptoms or pinworms?     2 days ago Note: Pinworms are very thin thread-like worms about the length of a staple.  Not able to get out in next couple days, have to wait til get some money and get something over the counter, can't go out I'll get something OTC Discussed OTC meds Pt stating she has to pee and don't have my diaper on, gotta get to bathroom but you've been so sweet thank you, confirmed she'd call back if worsening, disconnected   Offered pt be  examined, pt stating she'll try OTC first, difficulty getting in and out of home, advised pt use OTC meds (discussed), call back if worsening or new symptoms. Sending message to PCP in case further recommendations like appt/stool sample.  Protocols used: Rectal Symptoms-A-AH, Pinworms-A-AH

## 2023-12-26 ENCOUNTER — Other Ambulatory Visit: Payer: Self-pay | Admitting: Family Medicine

## 2023-12-26 DIAGNOSIS — M170 Bilateral primary osteoarthritis of knee: Secondary | ICD-10-CM

## 2023-12-28 ENCOUNTER — Encounter (HOSPITAL_COMMUNITY): Payer: Self-pay | Admitting: Psychiatry

## 2023-12-28 ENCOUNTER — Telehealth (HOSPITAL_COMMUNITY): Admitting: Psychiatry

## 2023-12-28 DIAGNOSIS — F319 Bipolar disorder, unspecified: Secondary | ICD-10-CM | POA: Diagnosis not present

## 2023-12-28 DIAGNOSIS — F4310 Post-traumatic stress disorder, unspecified: Secondary | ICD-10-CM | POA: Diagnosis not present

## 2023-12-28 MED ORDER — TRAZODONE HCL 150 MG PO TABS: 300.0000 mg | ORAL_TABLET | Freq: Every day | ORAL | 2 refills | Status: DC

## 2023-12-28 MED ORDER — FLUOXETINE HCL 20 MG PO CAPS: 20.0000 mg | ORAL_CAPSULE | Freq: Every day | ORAL | 2 refills | Status: DC

## 2023-12-28 MED ORDER — ALPRAZOLAM 1 MG PO TABS: ORAL_TABLET | ORAL | 2 refills | Status: DC

## 2023-12-28 MED ORDER — LAMOTRIGINE 100 MG PO TABS: 100.0000 mg | ORAL_TABLET | Freq: Two times a day (BID) | ORAL | 3 refills | Status: DC

## 2023-12-28 NOTE — Progress Notes (Signed)
 Virtual Visit via Telephone Note  I connected with Annette Galvan on 12/28/23 at 11:00 AM EDT by telephone and verified that I am speaking with the correct person using two identifiers.  Location: Patient: home Provider: office   I discussed the limitations, risks, security and privacy concerns of performing an evaluation and management service by telephone and the availability of in person appointments. I also discussed with the patient that there may be a patient responsible charge related to this service. The patient expressed understanding and agreed to proceed.      I discussed the assessment and treatment plan with the patient. The patient was provided an opportunity to ask questions and all were answered. The patient agreed with the plan and demonstrated an understanding of the instructions.   The patient was advised to call back or seek an in-person evaluation if the symptoms worsen or if the condition fails to improve as anticipated.  I provided 20 minutes of non-face-to-face time during this encounter.   [name], MD  [facility] MD/PA/NP OP Progress Note  12/28/2023 11:20 AM Annette Galvan  MRN:  [id number]  Chief Complaint:  Chief Complaint  Patient presents with   Anxiety   Depression   Follow-up   HPI: This patient is a 63 year old widowed white female who lives alone in [place]. She does have a worker who comes in several times a week. She is on disability   The patient returns for follow-up after 3 months regarding her depression anxiety and PTSD.  She states she is doing okay but is having several difficulties.  Her knees have gotten so bad that she is not able to get down the stairs to leave her house.  She is waiting for a ramp to be built later this month.  She needs to have knee surgery but has gained too much weight.  She tried to get on Mounjaro but her insurance would not cover it.  Overall her mood has been fairly stable and she is sleeping well.  She denies  any thoughts of self-harm or suicide.  She states that the Xanax  continues to use to help her anxiety.  Visit Diagnosis:    ICD-10-CM   1. Bipolar 1 disorder (HCC)  F31.9     2. PTSD (post-traumatic stress disorder)  F43.10       Past Psychiatric History: History of depression, anxiety and a remote history of substance abuse. Psychiatric admissions in her 62s and 30s   Past Medical History:  Past Medical History:  Diagnosis Date   Arthritis    needs 2 knee replacements; also has left shoulder and left hip pain   Back pain, chronic    Bilateral chronic knee pain    Bipolar 1 disorder (HCC)    Bulging disc    Fibromyalgia    GERD (gastroesophageal reflux disease)    HSV-2 (herpes simplex virus 2) infection 2013   Hx of trichomoniasis    Itching 06/26/2013   Leg pain    Trichimoniasis 06/26/2013   UTI (lower urinary tract infection) 10/12/2012   Vaginal discharge 06/26/2013   Vaginal irritation 12/05/2013   Vaginal odor 12/28/2012   Had discharge +clue   Yeast infection 02/04/2013    Past Surgical History:  Procedure Laterality Date   ABDOMINAL HYSTERECTOMY     total   BREAST ENHANCEMENT SURGERY Right    COLONOSCOPY WITH PROPOFOL  N/A 12/07/2015   Procedure: COLONOSCOPY WITH PROPOFOL ;  Surgeon: Lamar CHRISTELLA Hollingshead, MD;  [facility];  Service: Endoscopy;  Laterality: N/A;  915   TUBAL LIGATION      Family Psychiatric History: See below  Family History:  Family History  Problem Relation Age of Onset   Hypertension Mother    Diabetes Mother    Depression Mother    Hyperlipidemia Mother    Fibromyalgia Mother    Arthritis Mother    Cancer Father        bone   Hyperlipidemia Father    Hypertension Father    Bipolar disorder Father    Depression Sister    Hyperlipidemia Sister    Fibromyalgia Sister    Fibromyalgia Sister    Bipolar disorder Other    Drug abuse Other    Alcohol abuse Other    Hypertension Brother    Other Brother        shingles; back  problems   Diabetes Paternal Grandmother    Alzheimer's disease Maternal Grandmother    Obesity Daughter    Other Daughter        back problems   Bipolar disorder Daughter    Depression Daughter    Other Daughter        on pain meds   Colon cancer Neg Hx     Social History:  Social History   Socioeconomic History   Marital status: Widowed    Spouse name: Not on file   Number of children: Not on file   Years of education: Not on file   Highest education level: Not on file  Occupational History   Occupation: Disabled  Tobacco Use   Smoking status: Light Smoker    Current packs/day: 0.25    Average packs/day: 0.3 packs/day for 25.0 years (6.3 ttl pk-yrs)    Types: Cigarettes   Smokeless tobacco: Never   Tobacco comments:    2 cigarettes daily  Vaping Use   Vaping status: Never Used  Substance and Sexual Activity   Alcohol use: Yes    Alcohol/week: 0.0 standard drinks of alcohol    Comment: occasional   Drug use: No    Comment: cocaine, clean for 4.5 years as of 12/04/2015   Sexual activity: Yes    Birth control/protection: Surgical    Comment: hysterectomy  Other Topics Concern   Not on file  Social History Narrative   Not on file   Social Drivers of Health   Financial Resource Strain: Low Risk  (08/20/2020)   Overall Financial Resource Strain (CARDIA)    Difficulty of Paying Living Expenses: Not hard at all  Food Insecurity: No Food Insecurity (08/20/2020)   Hunger Vital Sign    Worried About Running Out of Food in the Last Year: Never true    Ran Out of Food in the Last Year: Never true  Transportation Needs: No Transportation Needs (08/20/2020)   PRAPARE - Administrator, Civil Service (Medical): No    Lack of Transportation (Non-Medical): No  Physical Activity: Inactive (08/20/2020)   Exercise Vital Sign    Days of Exercise per Week: 0 days    Minutes of Exercise per Session: 0 min  Stress: No Stress Concern Present (08/20/2020)   [employer]  of Occupational Health - Occupational Stress Questionnaire    Feeling of Stress : Not at all  Social Connections: Socially Isolated (08/20/2020)   Social Connection and Isolation Panel    Frequency of Communication with Friends and Family: More than three times a week    Frequency of Social Gatherings with Friends and  Family: Once a week    Attends Religious Services: Never    Active Member of Clubs or Organizations: No    Attends Banker Meetings: Never    Marital Status: Widowed    Allergies:  Allergies  Allergen Reactions   Amoxicillin  Hives   Hydrocodone  Nausea And Vomiting   Latex Swelling    Ankle Area   Lisinopril  Cough   Ampicillin Hives and Nausea And Vomiting    Metabolic Disorder Labs: Lab Results  Component Value Date   HGBA1C 5.3 11/28/2023   MPG 117 05/18/2020   MPG 108 12/02/2019   No results found for: PROLACTIN Lab Results  Component Value Date   CHOL 183 11/28/2023   TRIG 176 (H) 11/28/2023   HDL 63 11/28/2023   CHOLHDL 2.9 11/28/2023   VLDL 23 08/12/2016   LDLCALC 90 11/28/2023   LDLCALC 93 05/12/2022   Lab Results  Component Value Date   TSH 1.690 11/28/2023   TSH 0.601 05/12/2022    Therapeutic Level Labs: No results found for: LITHIUM No results found for: VALPROATE No results found for: CBMZ  Current Medications: Current Outpatient Medications  Medication Sig Dispense Refill   acetaminophen  (TYLENOL ) 500 MG tablet Take 1 tablet (500 mg total) by mouth every 6 (six) hours as needed. 30 tablet 0   acyclovir  ointment (ZOVIRAX ) 5 % Apply 1 Application topically 5 (five) times daily. 30 g 0   albuterol  (VENTOLIN  HFA) 108 (90 Base) MCG/ACT inhaler Inhale 1-2 puffs into the lungs every 4 (four) hours as needed for wheezing or shortness of breath. 8.5 g 0   ALPRAZolam  (XANAX ) 1 MG tablet TAKE (1) TABLET BY MOUTH (4) TIMES DAILY AS NEEDED FOR ANXIETY. 120 tablet 2   cetirizine  (ZYRTEC ) 10 MG tablet Take 1 tablet (10 mg total)  by mouth daily. 30 tablet 2   chlorhexidine  (PERIDEX ) 0.12 % solution SMARTSIG:By Mouth     diclofenac  Sodium (VOLTAREN ) 1 % GEL Apply 2 g topically 4 (four) times daily. 100 g 0   FLUoxetine  (PROZAC ) 20 MG capsule Take 1 capsule (20 mg total) by mouth daily. 30 capsule 2   fluticasone  (FLONASE ) 50 MCG/ACT nasal spray PLACE 2 SPRAYS IN EACH NOSTRIL ONCE DAILY AS NEEDED FOR HEAD CONGESTION AS DIRECTED. 16 g 1   gabapentin  (NEURONTIN ) 300 MG capsule TAKE 2 CAPSULES BY MOUTH 3 TIMES DAILY. 180 capsule 11   ibuprofen  (ADVIL ) 800 MG tablet Take 1 tablet (800 mg total) by mouth 3 (three) times daily. 30 tablet 1   lamoTRIgine  (LAMICTAL ) 100 MG tablet Take 1 tablet (100 mg total) by mouth 2 (two) times daily. 60 tablet 3   losartan  (COZAAR ) 50 MG tablet Take 1 tablet (50 mg total) by mouth daily. 100 tablet 3   metFORMIN  (GLUCOPHAGE ) 500 MG tablet TAKE ONE TABLET BY MOUTH TWICE DAILY WITH MEALS 60 tablet 0   mometasone  (NASONEX ) 50 MCG/ACT nasal spray Place 2 sprays into the nose daily. 1 each 2   omeprazole  (PRILOSEC) 40 MG capsule TAKE (1) CAPSULE BY MOUTH ONCE DAILY FOR ACID REFLUX. 90 capsule 0   ondansetron  (ZOFRAN ) 4 MG tablet TAKE (1) TABLET BY MOUTH EVERY EIGHT HOURS AS NEEDED. 20 tablet 0   rosuvastatin  (CRESTOR ) 5 MG tablet Take 1 tablet (5 mg total) by mouth daily. 90 tablet 3   semaglutide -weight management (WEGOVY ) 0.25 MG/0.5ML SOAJ SQ injection Inject 0.25 mg into the skin once a week. 2 mL 0   silver  sulfADIAZINE  (SSD) 1 % cream Apply  topically daily. 50 g 0   solifenacin  (VESICARE ) 5 MG tablet Take 1 tablet (5 mg total) by mouth daily. 90 tablet 1   tiZANidine  (ZANAFLEX ) 2 MG tablet TAKE 2 TABLETS (4 MG) BY MOUTH ONCE EVERY 8 HOURS AS NEEDED FOR MUSCLE SPASMS. 180 tablet 3   traZODone  (DESYREL ) 150 MG tablet Take 2 tablets (300 mg total) by mouth at bedtime. 60 tablet 2   No current facility-administered medications for this visit.     Musculoskeletal: Strength & Muscle Tone:  na Gait & Station: na Patient leans: N/A  Psychiatric Specialty Exam: Review of Systems  Musculoskeletal:  Positive for arthralgias, gait problem and joint swelling.  All other systems reviewed and are negative.   There were no vitals taken for this visit.There is no height or weight on file to calculate BMI.  General Appearance: NA  Eye Contact:  NA  Speech:  Clear and Coherent  Volume:  Normal  Mood:  Euthymic  Affect:  NA  Thought Process:  Goal Directed  Orientation:  Full (Time, Place, and Person)  Thought Content: Rumination   Suicidal Thoughts:  No  Homicidal Thoughts:  No  Memory:  Immediate;   Good Recent;   Good Remote;   NA  Judgement:  Fair  Insight:  Fair  Psychomotor Activity:  Decreased  Concentration:  Concentration: Good and Attention Span: Good  Recall:  Good  Fund of Knowledge: Good  Language: Good  Akathisia:  No  Handed:  Right  AIMS (if indicated): not done  Assets:  Communication Skills Desire for Improvement Resilience Social Support  ADL's:  Intact  Cognition: WNL  Sleep:  Good   Screenings: GAD-7    Flowsheet Row Office Visit from 11/28/2023 in [facility] Office Visit from 04/07/2023 in [facility] [place] [facility] Office Visit from 09/23/2022 in [facility] [place] [facility]  Total GAD-7 Score 5 5 5    PHQ2-9    Flowsheet Row Office Visit from 11/28/2023 in [facility] Office Visit from 04/07/2023 in [facility] [place] [facility] Office Visit from 09/23/2022 in [facility] [place] [facility] Video Visit from 01/10/2022 in [facility] at [place] Video Visit from 10/11/2021 in [facility] Total Score 3 3 4 1 1   PHQ-9 Total Score 11 -- 14 -- --   Flowsheet Row Video Visit from 01/10/2022 in [place] [facility] at [place] Video Visit from 10/11/2021 in [facility] at [place] Video Visit from 06/24/2021 in [facility] at [place]  C-SSRS RISK CATEGORY No Risk No Risk No Risk     Assessment and Plan: This patient is a 63 year old female with a history of PTSD and anxiety.  She is doing well on her current regimen.  She will continue Prozac  20 mg daily for depression and anxiety, Lamictal  100 mg twice daily for mood swings, Xanax  1 mg up to 4 times daily as needed for anxiety and trazodone  300 mg at bedtime for sleep.  She will return to see me in 3 months  Collaboration of Care: Collaboration of Care: Primary Care Provider AEB notes are shared with PCP on the epic system  Patient/Guardian was advised Release of Information must be obtained prior to any record release in order to collaborate their care with an outside provider. Patient/Guardian was advised if they have not already done so to contact the registration department  to sign all necessary forms in order for us  to release information regarding their care.   Consent: Patient/Guardian gives verbal consent for treatment and assignment of benefits for services provided during this visit. Patient/Guardian expressed understanding and agreed to proceed.    [name], MD 12/28/2023, 11:20 AM

## 2024-01-04 ENCOUNTER — Other Ambulatory Visit (HOSPITAL_COMMUNITY): Payer: Self-pay | Admitting: Psychiatry

## 2024-01-05 ENCOUNTER — Other Ambulatory Visit: Payer: Self-pay | Admitting: Family Medicine

## 2024-01-05 DIAGNOSIS — M170 Bilateral primary osteoarthritis of knee: Secondary | ICD-10-CM

## 2024-01-10 ENCOUNTER — Other Ambulatory Visit: Payer: Self-pay | Admitting: Family Medicine

## 2024-01-10 DIAGNOSIS — M170 Bilateral primary osteoarthritis of knee: Secondary | ICD-10-CM

## 2024-01-10 DIAGNOSIS — M797 Fibromyalgia: Secondary | ICD-10-CM

## 2024-01-10 NOTE — Telephone Encounter (Unsigned)
 Copied from CRM [id number]. Topic: Clinical - Medication Refill >> Jan 10, 2024  4:54 PM Debby BROCKS wrote: Medication: tiZANidine  (ZANAFLEX ) 2 MG tablet diclofenac  Sodium (VOLTAREN ) 1 % GEL   Has the patient contacted their pharmacy? Yes (Agent: If no, request that the patient contact the pharmacy for the refill. If patient does not wish to contact the pharmacy document the reason why and proceed with request.) (Agent: If yes, when and what did the pharmacy advise?)  This is the patient's preferred pharmacy:  [facility] [place] [place] - [address] [place] [address] Phone: [phone] Fax: [phone]  Is this the correct pharmacy for this prescription? Yes If no, delete pharmacy and type the correct one.   Has the prescription been filled recently? Yes  Is the patient out of the medication? Yes  Has the patient been seen for an appointment in the last year OR does the patient have an upcoming appointment? Yes  Can we respond through MyChart? No  Agent: Please be advised that Rx refills may take up to 3 business days. We ask that you follow-up with your pharmacy.

## 2024-01-11 ENCOUNTER — Other Ambulatory Visit: Payer: Self-pay | Admitting: Family Medicine

## 2024-01-12 MED ORDER — TIZANIDINE HCL 2 MG PO TABS: ORAL_TABLET | ORAL | 3 refills | Status: DC

## 2024-01-12 MED ORDER — DICLOFENAC SODIUM 1 % EX GEL: 2.0000 g | Freq: Four times a day (QID) | CUTANEOUS | 0 refills | Status: AC

## 2024-01-14 ENCOUNTER — Other Ambulatory Visit: Payer: Self-pay

## 2024-01-14 ENCOUNTER — Emergency Department (HOSPITAL_COMMUNITY)
Admission: EM | Admit: 2024-01-14 | Discharge: 2024-01-14 | Discharge status: Against Medical Advice | Attending: Emergency Medicine | Admitting: Emergency Medicine

## 2024-01-14 ENCOUNTER — Emergency Department (HOSPITAL_COMMUNITY)

## 2024-01-14 DIAGNOSIS — M25562 Pain in left knee: Secondary | ICD-10-CM | POA: Diagnosis not present

## 2024-01-14 DIAGNOSIS — Y92008 Other place in unspecified non-institutional (private) residence as the place of occurrence of the external cause: Secondary | ICD-10-CM | POA: Insufficient documentation

## 2024-01-14 DIAGNOSIS — M25561 Pain in right knee: Secondary | ICD-10-CM | POA: Diagnosis not present

## 2024-01-14 DIAGNOSIS — M85862 Other specified disorders of bone density and structure, left lower leg: Secondary | ICD-10-CM | POA: Diagnosis not present

## 2024-01-14 DIAGNOSIS — M25569 Pain in unspecified knee: Secondary | ICD-10-CM | POA: Diagnosis not present

## 2024-01-14 DIAGNOSIS — Z5321 Procedure and treatment not carried out due to patient leaving prior to being seen by health care provider: Secondary | ICD-10-CM | POA: Insufficient documentation

## 2024-01-14 DIAGNOSIS — W108XXA Fall (on) (from) other stairs and steps, initial encounter: Secondary | ICD-10-CM | POA: Diagnosis not present

## 2024-01-14 DIAGNOSIS — M1711 Unilateral primary osteoarthritis, right knee: Secondary | ICD-10-CM | POA: Diagnosis not present

## 2024-01-14 MED ORDER — ACETAMINOPHEN 500 MG PO TABS
1000.0000 mg | ORAL_TABLET | Freq: Once | ORAL | Status: DC
  Filled 2024-01-14: qty 2

## 2024-01-14 NOTE — ED Notes (Signed)
 Pt agitated, refusing to stay in bed. Repeated attempts to caution patient about current mobility issues unsuccessful. Attempted to contact daughter a patient's request also unsuccessful.

## 2024-01-14 NOTE — ED Triage Notes (Signed)
 Pt arrived via [facility] from home c/o a fall down her porch steps, per ems no deformities noted and pt is A&O x 4, denies LOC, denies use of blood thinners, endorses bilateral knee pain--however, states this is chronic x 8 years and needs knee replacement surgery, denies any other pain at this time

## 2024-01-14 NOTE — ED Provider Notes (Signed)
 Patient left without being seen after triage   Franklyn Sid SAILOR, MD 01/14/24 [phone]

## 2024-01-17 ENCOUNTER — Telehealth (HOSPITAL_COMMUNITY): Payer: Self-pay

## 2024-01-17 NOTE — Telephone Encounter (Signed)
 Called to schedule follow up with dr okey no answer no vm

## 2024-01-23 ENCOUNTER — Other Ambulatory Visit: Payer: Self-pay | Admitting: Family Medicine

## 2024-01-26 ENCOUNTER — Ambulatory Visit

## 2024-01-26 VITALS — Ht 64.0 in | Wt 270.0 lb

## 2024-01-26 DIAGNOSIS — Z0000 Encounter for general adult medical examination without abnormal findings: Secondary | ICD-10-CM

## 2024-01-26 NOTE — Progress Notes (Signed)
 Subjective:   Annette Galvan is a 63 y.o. who presents for a Medicare Wellness preventive visit.  As a reminder, Annual Wellness Visits don'Galvan include a physical exam, and some assessments may be limited, especially if this visit is performed virtually. We may recommend an in-person follow-up visit with your provider if needed.  Visit Complete: Virtual I connected with  Annette Galvan on 01/26/24 by a audio enabled telemedicine application and verified that I am speaking with the correct person using two identifiers.  Patient Location: Home  Provider Location: Home Office  I discussed the limitations of evaluation and management by telemedicine. The patient expressed understanding and agreed to proceed.  Vital Signs: Because this visit was a virtual/telehealth visit, some criteria may be missing or patient reported. Any vitals not documented were not able to be obtained and vitals that have been documented are patient reported.  VideoDeclined- This patient declined Librarian, academic. Therefore the visit was completed with audio only.  Persons Participating in Visit: Patient.  AWV Questionnaire: No: Patient Medicare AWV questionnaire was not completed prior to this visit.  Cardiac Risk Factors include: advanced age (>14men, >41 women);hypertension;dyslipidemia;smoking/ tobacco exposure;sedentary lifestyle     Objective:    Today's Vitals   01/26/24 1350  Weight: 270 lb (122.5 kg)  Height: 5' 4 (1.626 m)   Body mass index is 46.35 kg/m.     01/26/2024    1:58 PM 03/16/2021    1:03 PM 08/20/2020   10:01 AM 12/20/2019   12:03 PM 05/05/2019   10:00 AM 03/13/2019    2:18 PM 01/25/2018   10:55 AM  Advanced Directives  Does Patient Have a Medical Advance Directive? No No No No No No No   Would patient like information on creating a medical advance directive? Yes (MAU/Ambulatory/Procedural Areas - Information given) No - Patient declined No - Patient  declined No - Patient declined No - Patient declined No - Patient declined No - Patient declined      Data saved with a previous flowsheet row definition    Current Medications (verified) Outpatient Encounter Medications as of 01/26/2024  Medication Sig   acetaminophen  (TYLENOL ) 500 MG tablet Take 1 tablet (500 mg total) by mouth every 6 (six) hours as needed.   acyclovir  ointment (ZOVIRAX ) 5 % Apply 1 Application topically 5 (five) times daily.   albuterol  (VENTOLIN  HFA) 108 (90 Base) MCG/ACT inhaler Inhale 1-2 puffs into the lungs every 4 (four) hours as needed for wheezing or shortness of breath.   ALPRAZolam  (XANAX ) 1 MG tablet TAKE (1) TABLET BY MOUTH (4) TIMES DAILY AS NEEDED FOR ANXIETY.   cetirizine  (ZYRTEC ) 10 MG tablet Take 1 tablet (10 mg total) by mouth daily.   chlorhexidine  (PERIDEX ) 0.12 % solution SMARTSIG:By Mouth   diclofenac  Sodium (VOLTAREN ) 1 % GEL Apply 2 g topically 4 (four) times daily.   FLUoxetine  (PROZAC ) 20 MG capsule Take 1 capsule (20 mg total) by mouth daily.   fluticasone  (FLONASE ) 50 MCG/ACT nasal spray PLACE 2 SPRAYS IN EACH NOSTRIL ONCE DAILY AS NEEDED FOR HEAD CONGESTION AS DIRECTED.   gabapentin  (NEURONTIN ) 300 MG capsule TAKE 2 CAPSULES BY MOUTH 3 TIMES DAILY.   ibuprofen  (ADVIL ) 800 MG tablet Take 1 tablet (800 mg total) by mouth 3 (three) times daily.   lamoTRIgine  (LAMICTAL ) 100 MG tablet Take 1 tablet (100 mg total) by mouth 2 (two) times daily.   losartan  (COZAAR ) 50 MG tablet Take 1 tablet (50 mg total) by  mouth daily.   mometasone  (NASONEX ) 50 MCG/ACT nasal spray Place 2 sprays into the nose daily.   omeprazole  (PRILOSEC) 40 MG capsule TAKE (1) CAPSULE BY MOUTH ONCE DAILY FOR ACID REFLUX.   rosuvastatin  (CRESTOR ) 5 MG tablet Take 1 tablet (5 mg total) by mouth daily.   semaglutide -weight management (WEGOVY ) 0.25 MG/0.5ML SOAJ SQ injection Inject 0.25 mg into the skin once a week.   tiZANidine  (ZANAFLEX ) 2 MG tablet TAKE 2 TABLETS (4 MG) BY MOUTH  ONCE EVERY 8 HOURS AS NEEDED FOR MUSCLE SPASMS.   traZODone  (DESYREL ) 150 MG tablet Take 2 tablets (300 mg total) by mouth at bedtime.   metFORMIN  (GLUCOPHAGE ) 500 MG tablet TAKE ONE TABLET BY MOUTH TWICE DAILY WITH MEALS (Patient not taking: Reported on 01/26/2024)   ondansetron  (ZOFRAN ) 4 MG tablet TAKE (1) TABLET BY MOUTH EVERY EIGHT HOURS AS NEEDED. (Patient not taking: Reported on 01/26/2024)   silver  sulfADIAZINE  (SSD) 1 % cream Apply topically daily. (Patient not taking: Reported on 01/26/2024)   solifenacin  (VESICARE ) 5 MG tablet Take 1 tablet (5 mg total) by mouth daily. (Patient not taking: Reported on 01/26/2024)   No facility-administered encounter medications on file as of 01/26/2024.    Allergies (verified) Amoxicillin , Hydrocodone , Latex, Lisinopril , and Ampicillin   History: Past Medical History:  Diagnosis Date   Arthritis    needs 2 knee replacements; also has left shoulder and left hip pain   Back pain, chronic    Bilateral chronic knee pain    Bipolar 1 disorder (HCC)    Bulging disc    Fibromyalgia    GERD (gastroesophageal reflux disease)    HSV-2 (herpes simplex virus 2) infection 2013   Hx of trichomoniasis    Itching 06/26/2013   Leg pain    Trichimoniasis 06/26/2013   UTI (lower urinary tract infection) 10/12/2012   Vaginal discharge 06/26/2013   Vaginal irritation 12/05/2013   Vaginal odor 12/28/2012   Had discharge +clue   Yeast infection 02/04/2013   Past Surgical History:  Procedure Laterality Date   ABDOMINAL HYSTERECTOMY     total   BREAST ENHANCEMENT SURGERY Right    COLONOSCOPY WITH PROPOFOL  N/A 12/07/2015   Procedure: COLONOSCOPY WITH PROPOFOL ;  Surgeon: Annette CHRISTELLA Hollingshead, MD;  Location: [facility];  Service: Endoscopy;  Laterality: N/A;  915   TUBAL LIGATION     Family History  Problem Relation Age of Onset   Hypertension Mother    Diabetes Mother    Depression Mother    Hyperlipidemia Mother    Fibromyalgia Mother    Arthritis Mother     Cancer Father        bone   Hyperlipidemia Father    Hypertension Father    Bipolar disorder Father    Depression Sister    Hyperlipidemia Sister    Fibromyalgia Sister    Fibromyalgia Sister    Bipolar disorder Other    Drug abuse Other    Alcohol abuse Other    Hypertension Brother    Other Brother        shingles; back problems   Diabetes Paternal Grandmother    Alzheimer's disease Maternal Grandmother    Obesity Daughter    Other Daughter        back problems   Bipolar disorder Daughter    Depression Daughter    Other Daughter        on pain meds   Colon cancer Neg Hx    Social History   Socioeconomic History  Marital status: Widowed    Spouse name: Not on file   Number of children: Not on file   Years of education: Not on file   Highest education level: Not on file  Occupational History   Occupation: Disabled  Tobacco Use   Smoking status: Light Smoker    Current packs/day: 0.25    Average packs/day: 0.3 packs/day for 25.0 years (6.3 ttl pk-yrs)    Types: Cigarettes   Smokeless tobacco: Never   Tobacco comments:    2 cigarettes daily  Vaping Use   Vaping status: Never Used  Substance and Sexual Activity   Alcohol use: Yes    Alcohol/week: 0.0 standard drinks of alcohol    Comment: occasional   Drug use: No    Comment: cocaine, clean for 4.5 years as of 12/04/2015   Sexual activity: Yes    Birth control/protection: Surgical    Comment: hysterectomy  Other Topics Concern   Not on file  Social History Narrative   Not on file   Social Drivers of Health   Financial Resource Strain: Low Risk  (01/26/2024)   Overall Financial Resource Strain (CARDIA)    Difficulty of Paying Living Expenses: Not hard at all  Food Insecurity: No Food Insecurity (01/26/2024)   Hunger Vital Sign    Worried About Running Out of Food in the Last Year: Never true    Ran Out of Food in the Last Year: Never true  Transportation Needs: No Transportation Needs (01/26/2024)    PRAPARE - Administrator, Civil Service (Medical): No    Lack of Transportation (Non-Medical): No  Physical Activity: Inactive (01/26/2024)   Exercise Vital Sign    Days of Exercise per Week: 0 days    Minutes of Exercise per Session: 0 min  Stress: No Stress Concern Present (01/26/2024)   [employer] of Occupational Health - Occupational Stress Questionnaire    Feeling of Stress: Only a little  Social Connections: Socially Isolated (01/26/2024)   Social Connection and Isolation Panel    Frequency of Communication with Friends and Family: Three times a week    Frequency of Social Gatherings with Friends and Family: Once a week    Attends Religious Services: Never    Database administrator or Organizations: No    Attends Banker Meetings: Never    Marital Status: Widowed    Tobacco Counseling Ready to quit: Not Answered Counseling given: Not Answered Tobacco comments: 2 cigarettes daily    Clinical Intake:  Pre-visit preparation completed: Yes  Pain : No/denies pain  Diabetes: No  Lab Results  Component Value Date   HGBA1C 5.3 11/28/2023   HGBA1C 5.7 (H) 05/12/2022   HGBA1C 5.5 10/25/2021     How often do you need to have someone help you when you read instructions, pamphlets, or other written materials from your doctor or pharmacy?: 1 - Never  Interpreter Needed?: No  Information entered by :: Charmaine Bloodgood LPN   Activities of Daily Living     01/26/2024    1:57 PM  In your present state of health, do you have any difficulty performing the following activities:  Hearing? 0  Vision? 0  Difficulty concentrating or making decisions? 0  Walking or climbing stairs? 0  Dressing or bathing? 0  Doing errands, shopping? 1  Preparing Food and eating ? N  Using the Toilet? N  In the past six months, have you accidently leaked urine? N  Do you have problems  with loss of bowel control? N  Managing your Medications? N  Managing your  Finances? N  Housekeeping or managing your Housekeeping? N    Patient Care Team: Cook, Jayce G, DO as PCP - General (Family Medicine) Shaaron Annette HERO, MD as Consulting Physician (Gastroenterology) Okey Barnie SAUNDERS, MD as Consulting Physician [facility])  I have updated your Care Teams any recent Medical Services you may have received from other providers in the past year.     Assessment:   This is a routine wellness examination for Annette Galvan.  Hearing/Vision screen Hearing Screening - Comments:: Patient is able to hear conversational tones without difficulty. No issues reported.   Vision Screening - Comments:: No vision problems; will schedule routine eye exam    Goals Addressed             This Visit's Progress    Improve mobility and prevent falls   On track      Depression Screen     01/26/2024    1:56 PM 11/28/2023   10:31 AM 04/07/2023   11:34 AM 09/23/2022   11:16 AM 01/10/2022   10:02 AM 10/11/2021    1:51 PM 06/24/2021    9:07 AM  PHQ 2/9 Scores  PHQ - 2 Score 3 3 3 4      PHQ- 9 Score 11 11  14         Information is confidential and restricted. Go to Review Flowsheets to unlock data.    Fall Risk     01/26/2024    1:57 PM 11/28/2023   10:31 AM 09/23/2022   11:15 AM 05/25/2021    9:57 AM 04/26/2021   11:03 AM  Fall Risk   Falls in the past year? 1 1 0 1 1  Number falls in past yr: 1 1  0 0  Injury with Fall? 0   1 1  Risk for fall due to : History of fall(s);Impaired balance/gait;Impaired mobility   Impaired balance/gait History of fall(s);Impaired balance/gait;Impaired mobility  Follow up Education provided;Falls prevention discussed;Falls evaluation completed   Falls evaluation completed;Education provided  Falls evaluation completed      Data saved with a previous flowsheet row definition    MEDICARE RISK AT HOME:  Medicare Risk at Home Any stairs in or around the home?: Yes If so, are there any without handrails?: No Home free of loose throw rugs  in walkways, pet beds, electrical cords, etc?: Yes Adequate lighting in your home to reduce risk of falls?: Yes Life alert?: No Use of a cane, walker or w/c?: No Grab bars in the bathroom?: Yes Shower chair or bench in shower?: No Elevated toilet seat or a handicapped toilet?: No  TIMED UP AND GO:  Was the test performed?  No  Cognitive Function: 6CIT completed        01/26/2024    1:58 PM 08/20/2020   10:02 AM  6CIT Screen  What Year? 0 points 0 points  What month? 0 points 0 points  What time? 0 points 0 points  Count back from 20 0 points 0 points  Months in reverse 0 points 0 points  Repeat phrase 2 points 0 points  Total Score 2 points 0 points    Immunizations Immunization History  Administered Date(s) Administered   Influenza Inj Mdck Quad With Preservative 05/08/2017   Influenza Split 04/23/2012   Influenza,inj,Quad PF,6+ Mos 01/09/2017, 01/25/2018, 03/13/2019, 05/18/2020   Influenza,trivalent, recombinat, inj, PF 01/28/2013   Influenza-Unspecified 05/08/2017   Moderna Sars-Covid-2 Vaccination  11/15/2019, 12/13/2019   Pneumococcal Polysaccharide-23 05/26/2014   Tdap 11/12/2014    Screening Tests Health Maintenance  Topic Date Due   Zoster Vaccines- Shingrix (1 of 2) Never done   Mammogram  Never done   Pneumococcal Vaccine: 50+ Years (2 of 2 - PCV) 05/27/2015   COVID-19 Vaccine (3 - Moderna risk series) 01/10/2020   Influenza Vaccine  11/17/2023   DTaP/Tdap/Td (2 - Td or Tdap) 11/11/2024   Medicare Annual Wellness (AWV)  01/25/2025   Colonoscopy  12/06/2025   Hepatitis C Screening  Completed   HIV Screening  Completed   Hepatitis B Vaccines 19-59 Average Risk  Aged Out   HPV VACCINES  Aged Out   Meningococcal B Vaccine  Aged Out    Health Maintenance Items Addressed: Patient declines vaccines at this time; declines mammogram   Additional Screening:  Vision Screening: Recommended annual ophthalmology exams for early detection of glaucoma and other  disorders of the eye. Is the patient up to date with their annual eye exam?  No  Who is the provider or what is the name of the office in which the patient attends annual eye exams? none  Dental Screening: Recommended annual dental exams for proper oral hygiene  Community Resource Referral / Chronic Care Management: CRR required this visit?  No   CCM required this visit?  No   Plan:    I have personally reviewed and noted the following in the patient's chart:   Medical and social history Use of alcohol, tobacco or illicit drugs  Current medications and supplements including opioid prescriptions. Patient is not currently taking opioid prescriptions. Functional ability and status Nutritional status Physical activity Advanced directives List of other physicians Hospitalizations, surgeries, and ER visits in previous 12 months Vitals Screenings to include cognitive, depression, and falls Referrals and appointments  In addition, I have reviewed and discussed with patient certain preventive protocols, quality metrics, and best practice recommendations. A written personalized care plan for preventive services as well as general preventive health recommendations were provided to patient.   Lavelle Pfeiffer [place], [place]   [id number]/89/7974   After Visit Summary: (Pick Up) Due to this being a telephonic visit, with patients personalized plan was offered to patient and patient has requested to Pick up at office.  Notes: Nothing significant to report at this time.

## 2024-01-26 NOTE — Patient Instructions (Signed)
 Annette Galvan,  Thank you for taking the time for your Medicare Wellness Visit. I appreciate your continued commitment to your health goals. Please review the care plan we discussed, and feel free to reach out if I can assist you further.  Medicare recommends these wellness visits once per year to help you and your care team stay ahead of potential health issues. These visits are designed to focus on prevention, allowing your provider to concentrate on managing your acute and chronic conditions during your regular appointments.  Please note that Annual Wellness Visits do not include a physical exam. Some assessments may be limited, especially if the visit was conducted virtually. If needed, we may recommend a separate in-person follow-up with your provider.  Ongoing Care Seeing your primary care provider every 3 to 6 months helps us  monitor your health and provide consistent, personalized care.   Referrals If a referral was made during today's visit and you haven't received any updates within two weeks, please contact the referred provider directly to check on the status.  Recommended Screenings:  Health Maintenance  Topic Date Due   Zoster (Shingles) Vaccine (1 of 2) Never done   Breast Cancer Screening  Never done   Pneumococcal Vaccine for age over 29 (2 of 2 - PCV) 05/27/2015   COVID-19 Vaccine (3 - Moderna risk series) 01/10/2020   Flu Shot  11/17/2023   DTaP/Tdap/Td vaccine (2 - Td or Tdap) 11/11/2024   Medicare Annual Wellness Visit  01/25/2025   Colon Cancer Screening  12/06/2025   Hepatitis C Screening  Completed   HIV Screening  Completed   Hepatitis B Vaccine  Aged Out   HPV Vaccine  Aged Out   Meningitis B Vaccine  Aged Out       01/26/2024    1:58 PM  Advanced Directives  Does Patient Have a Medical Advance Directive? No  Would patient like information on creating a medical advance directive? Yes (MAU/Ambulatory/Procedural Areas - Information given)   Advance Care  Planning is important because it: Ensures you receive medical care that aligns with your values, goals, and preferences. Provides guidance to your family and loved ones, reducing the emotional burden of decision-making during critical moments.  Vision: Annual vision screenings are recommended for early detection of glaucoma, cataracts, and diabetic retinopathy. These exams can also reveal signs of chronic conditions such as diabetes and high blood pressure.  Dental: Annual dental screenings help detect early signs of oral cancer, gum disease, and other conditions linked to overall health, including heart disease and diabetes.  Please see the attached documents for additional preventive care recommendations.

## 2024-01-30 ENCOUNTER — Other Ambulatory Visit (HOSPITAL_COMMUNITY): Payer: Self-pay

## 2024-02-05 ENCOUNTER — Other Ambulatory Visit: Payer: Self-pay | Admitting: Family Medicine

## 2024-02-05 DIAGNOSIS — M170 Bilateral primary osteoarthritis of knee: Secondary | ICD-10-CM

## 2024-02-05 NOTE — Telephone Encounter (Signed)
 Refill  ibuprofen  (ADVIL ) 800 MG tablet   [facility]

## 2024-02-06 ENCOUNTER — Other Ambulatory Visit: Payer: Self-pay | Admitting: Family Medicine

## 2024-02-06 DIAGNOSIS — M170 Bilateral primary osteoarthritis of knee: Secondary | ICD-10-CM

## 2024-02-06 NOTE — Telephone Encounter (Unsigned)
 Copied from CRM [id number]. Topic: Clinical - Medication Refill >> Feb 06, 2024  3:49 PM [name] wrote: Medication: ibuprofen  (ADVIL ) 800 MG tablet   Has the patient contacted their pharmacy? Yes (Agent: If no, request that the patient contact the pharmacy for the refill. If patient does not wish to contact the pharmacy document the reason why and proceed with request.) (Agent: If yes, when and what did the pharmacy advise?)  This is the patient's preferred pharmacy:  [facility] - [place], [place] - [address] [place] [address] Phone: [phone] Fax: [phone]  Is this the correct pharmacy for this prescription? Yes If no, delete pharmacy and type the correct one.   Has the prescription been filled recently? Yes  Is the patient out of the medication? Yes  Has the patient been seen for an appointment in the last year OR does the patient have an upcoming appointment? Yes  Can we respond through MyChart? No  Agent: Please be advised that Rx refills may take up to 3 business days. We ask that you follow-up with your pharmacy.

## 2024-02-08 ENCOUNTER — Other Ambulatory Visit: Payer: Self-pay | Admitting: Family Medicine

## 2024-02-08 DIAGNOSIS — M170 Bilateral primary osteoarthritis of knee: Secondary | ICD-10-CM

## 2024-02-08 NOTE — Telephone Encounter (Signed)
 Copied from CRM [id number]. Topic: Clinical - Medication Question >> Feb 08, 2024 10:16 AM Cleave MATSU wrote: Reason for CRM: pt needs ibuprofen  800 sent in asap please

## 2024-02-09 ENCOUNTER — Ambulatory Visit: Payer: Self-pay

## 2024-02-09 MED ORDER — IBUPROFEN 800 MG PO TABS: 800.0000 mg | ORAL_TABLET | Freq: Three times a day (TID) | ORAL | 1 refills | Status: DC

## 2024-02-09 NOTE — Telephone Encounter (Signed)
 Patient called with continued concerns of sciatic pain to right side. Patient has been calling for ibuprofen  refill. Patient is updated that she is needing a follow up to recheck her kidney function. Patient verbalized understanding. Patient is wanting to wait to see PCP at her appointment on 10/31. Any other recommendations, patient would like a follow up call.   FYI Only or Action Required?: Action required by provider: update on patient condition.  Patient was last seen in primary care on 11/28/2023 by Cook, Jayce G, DO.  Called Nurse Triage reporting Back Pain.  Symptoms began several weeks ago.  Interventions attempted: Rest, hydration, or home remedies.  Symptoms are: unchanged.  Triage Disposition: See HCP Within 4 Hours (Or PCP Triage)  Patient/caregiver understands and will follow disposition?: No, wishes to speak with PCP  Copied from CRM #[id number]. Topic: Clinical - Red Word Triage >> Feb 09, 2024  9:47 AM Joesph NOVAK wrote: Red Word that prompted transfer to Nurse Triage:  Patient needs her presciption for ibuprofen , patient is in pain with her Sciatic Nerve. Patient fell. Reason for Disposition  [1] SEVERE back pain (e.g., excruciating, unable to do any normal activities) AND [2] not improved 2 hours after pain medicine  Answer Assessment - Initial Assessment Questions 1. ONSET: When did the pain begin? (e.g., minutes, hours, days)     Chronic pain 2. LOCATION: Where does it hurt? (upper, mid or lower back)     Lower back pain that goes down her leg 3. SEVERITY: How bad is the pain?  (e.g., Scale 1-10; mild, moderate, or severe)     8 out of 10 4. PATTERN: Is the pain constant? (e.g., yes, no; constant, intermittent)      constant 5. RADIATION: Does the pain shoot into your legs or somewhere else?     Radiates down leg 6. CAUSE:  What do you think is causing the back pain?      sciatica 7. BACK OVERUSE:  Any recent lifting of heavy objects, strenuous work or  exercise?     no 8. MEDICINES: What have you taken so far for the pain? (e.g., nothing, acetaminophen , NSAIDS)     nothing 9. NEUROLOGIC SYMPTOMS: Do you have any weakness, numbness, or problems with bowel/bladder control?     no 10. OTHER SYMPTOMS: Do you have any other symptoms? (e.g., fever, abdomen pain, burning with urination, blood in urine)       no  Protocols used: Back Pain-A-AH

## 2024-02-16 ENCOUNTER — Encounter: Admitting: Family Medicine

## 2024-02-16 ENCOUNTER — Ambulatory Visit

## 2024-02-19 ENCOUNTER — Other Ambulatory Visit: Payer: Self-pay | Admitting: Family Medicine

## 2024-02-21 NOTE — Telephone Encounter (Signed)
 Appointment scheduled With Family Medicine Lorie KANDICE Ahle, DO) 02/27/2024 at 11:00 AM

## 2024-02-27 ENCOUNTER — Ambulatory Visit: Admitting: Family Medicine

## 2024-02-27 ENCOUNTER — Other Ambulatory Visit: Payer: Self-pay | Admitting: Family Medicine

## 2024-02-27 ENCOUNTER — Telehealth: Payer: Self-pay | Admitting: *Deleted

## 2024-02-27 ENCOUNTER — Encounter: Payer: Self-pay | Admitting: Family Medicine

## 2024-02-27 DIAGNOSIS — R39198 Other difficulties with micturition: Secondary | ICD-10-CM | POA: Diagnosis not present

## 2024-02-27 DIAGNOSIS — M545 Low back pain, unspecified: Secondary | ICD-10-CM | POA: Diagnosis not present

## 2024-02-27 DIAGNOSIS — R7989 Other specified abnormal findings of blood chemistry: Secondary | ICD-10-CM

## 2024-02-27 DIAGNOSIS — M797 Fibromyalgia: Secondary | ICD-10-CM | POA: Diagnosis not present

## 2024-02-27 DIAGNOSIS — R110 Nausea: Secondary | ICD-10-CM

## 2024-02-27 MED ORDER — SULFAMETHOXAZOLE-TRIMETHOPRIM 800-160 MG PO TABS: 1.0000 | ORAL_TABLET | Freq: Two times a day (BID) | ORAL | 0 refills | Status: AC

## 2024-02-27 MED ORDER — PROMETHAZINE HCL 25 MG PO TABS: 25.0000 mg | ORAL_TABLET | Freq: Three times a day (TID) | ORAL | 0 refills | Status: AC | PRN

## 2024-02-27 MED ORDER — GABAPENTIN 300 MG PO CAPS: ORAL_CAPSULE | ORAL | 11 refills | Status: AC

## 2024-02-27 NOTE — Assessment & Plan Note (Signed)
Phenergan as needed

## 2024-02-27 NOTE — Assessment & Plan Note (Signed)
 Gabapentin refilled

## 2024-02-27 NOTE — Assessment & Plan Note (Signed)
 Suprapubic tenderness on exam. Abnormal UA but no pyuria. Placing on bactrim  while awaiting culture.

## 2024-02-27 NOTE — Telephone Encounter (Unsigned)
 Copied from CRM [id number]. Topic: Clinical - Medication Question >> Feb 27, 2024  1:04 PM Nathanel BROCKS wrote: Reason for CRM: pt stated that Dr Bluford was going to call her in some phenergan .Please advise.

## 2024-02-27 NOTE — Patient Instructions (Signed)
 Lab today.  Xray at the hospital.  Medication as directed while awaiting urine culture.

## 2024-02-27 NOTE — Assessment & Plan Note (Signed)
 Acute on chronic. Xray for further evaluation.

## 2024-02-27 NOTE — Progress Notes (Signed)
 Subjective:  Patient ID: Annette Galvan, female    DOB: Feb 07, 1961  Age: 63 y.o. MRN: [id number]  CC:   Chief Complaint  Patient presents with   pain in abdomen, back and knees    Trouble urinating - small amounts , blurry vision, nausea Symptoms started on Sunday- patient called 911 on Sunday     HPI:  63 year old female presents for a physical. However, she has had an annual wellness visit and has multiple complaints today.   Patient reports that on Sunday she had several symptoms - difficulty urinating, transient visit changes, and high blood pressure. She called 911 and was evaluated but did not go to the hospital.  She reports that she is still having difficulty urinating. Reports associated low back pain and lower abdominal pain. No fever. Also reports nausea.  Additionally, patient reports ongoing pain of her knees. Has been taking Ibuprofen  but creatinine recently elevated, so recommended against continuation.   Lastly, patient reports that she is in need of refill of Gabapentin . Patient Active Problem List   Diagnosis Date Noted   Difficulty urinating 02/27/2024   Elevated serum creatinine 02/27/2024   Bilateral low back pain without sciatica 02/27/2024   Nausea 02/27/2024   Overactive bladder 09/25/2022   Vitamin D  deficiency 05/27/2021   Hyperlipidemia 05/27/2021   Seasonal allergies 05/25/2021   Subclinical hyperthyroidism 08/14/2015   GERD (gastroesophageal reflux disease) 11/12/2014   OA (osteoarthritis) of knee 05/17/2013   Sleep apnea 09/13/2012   Tobacco use 09/13/2012   Hypertension 02/10/2012   Fibromyalgia 10/11/2011   Morbid obesity (HCC) 08/29/2011   Prediabetes 08/29/2011   Bipolar 1 disorder (HCC) 08/29/2011   DDD (degenerative disc disease), lumbar 04/04/2007    Social Hx   Social History   Socioeconomic History   Marital status: Widowed    Spouse name: Not on file   Number of children: Not on file   Years of education: Not on file    Highest education level: Not on file  Occupational History   Occupation: Disabled  Tobacco Use   Smoking status: Light Smoker    Current packs/day: 0.25    Average packs/day: 0.3 packs/day for 25.0 years (6.3 ttl pk-yrs)    Types: Cigarettes   Smokeless tobacco: Never   Tobacco comments:    2 cigarettes daily  Vaping Use   Vaping status: Never Used  Substance and Sexual Activity   Alcohol use: Yes    Alcohol/week: 0.0 standard drinks of alcohol    Comment: occasional   Drug use: No    Comment: cocaine, clean for 4.5 years as of 12/04/2015   Sexual activity: Yes    Birth control/protection: Surgical    Comment: hysterectomy  Other Topics Concern   Not on file  Social History Narrative   Not on file   Social Drivers of Health   Financial Resource Strain: Low Risk  (01/26/2024)   Overall Financial Resource Strain (CARDIA)    Difficulty of Paying Living Expenses: Not hard at all  Food Insecurity: No Food Insecurity (01/26/2024)   Hunger Vital Sign    Worried About Running Out of Food in the Last Year: Never true    Ran Out of Food in the Last Year: Never true  Transportation Needs: No Transportation Needs (01/26/2024)   PRAPARE - Administrator, Civil Service (Medical): No    Lack of Transportation (Non-Medical): No  Physical Activity: Inactive (01/26/2024)   Exercise Vital Sign    Days  of Exercise per Week: 0 days    Minutes of Exercise per Session: 0 min  Stress: No Stress Concern Present (01/26/2024)   [employer] of Occupational Health - Occupational Stress Questionnaire    Feeling of Stress: Only a little  Social Connections: Socially Isolated (01/26/2024)   Social Connection and Isolation Panel    Frequency of Communication with Friends and Family: Three times a week    Frequency of Social Gatherings with Friends and Family: Once a week    Attends Religious Services: Never    Database Administrator or Organizations: No    Attends Tax Inspector Meetings: Never    Marital Status: Widowed    Review of Systems Per HPI  Objective:  BP 138/82   Pulse 100   Temp 98.1 F (36.7 C)   Ht 5' 4 (1.626 m)   Wt 261 lb (118.4 kg)   SpO2 97%   BMI 44.80 kg/m      02/27/2024   11:24 AM 01/26/2024    1:50 PM 01/14/2024   12:04 PM  BP/Weight  Systolic BP 138 -- 134  Diastolic BP 82 -- 77  Wt. (Lbs) 261 270   BMI 44.8 kg/m2 46.35 kg/m2     Physical Exam Vitals and nursing note reviewed.  Constitutional:      Appearance: She is obese.  HENT:     Head: Normocephalic and atraumatic.  Cardiovascular:     Rate and Rhythm: Normal rate and regular rhythm.  Pulmonary:     Effort: Pulmonary effort is normal.     Breath sounds: Normal breath sounds. No wheezing or rales.  Abdominal:     General: There is no distension.     Palpations: Abdomen is soft.     Comments: Suprapubic tenderness to palpation.   Neurological:     Mental Status: She is alert.     Lab Results  Component Value Date   WBC 6.7 11/28/2023   HGB 12.1 11/28/2023   HCT 39.4 11/28/2023   PLT 248 11/28/2023   GLUCOSE 78 11/28/2023   CHOL 183 11/28/2023   TRIG 176 (H) 11/28/2023   HDL 63 11/28/2023   LDLCALC 90 11/28/2023   ALT 15 11/28/2023   AST 19 11/28/2023   NA 141 11/28/2023   K 4.9 11/28/2023   CL 103 11/28/2023   CREATININE 1.12 (H) 11/28/2023   BUN 16 11/28/2023   CO2 25 11/28/2023   TSH 1.690 11/28/2023   HGBA1C 5.3 11/28/2023     Assessment & Plan:  Elevated serum creatinine Assessment & Plan: Rechecking today. Advised against NSAIDs at this time.  Orders: -     Basic metabolic panel with GFR  Bilateral low back pain without sciatica, unspecified chronicity Assessment & Plan: Acute on chronic. Xray for further evaluation.  Orders: -     DG Lumbar Spine Complete  Fibromyalgia Assessment & Plan: Gabapentin  refilled.  Orders: -     Gabapentin ; TAKE 2 CAPSULES BY MOUTH 3 TIMES DAILY.  Dispense: 180 capsule;  Refill: 11  Difficulty urinating Assessment & Plan: Suprapubic tenderness on exam. Abnormal UA but no pyuria. Placing on bactrim  while awaiting culture.  Orders: -     Urinalysis -     Urine Culture -     Sulfamethoxazole -Trimethoprim ; Take 1 tablet by mouth 2 (two) times daily.  Dispense: 14 tablet; Refill: 0  Nausea Assessment & Plan: Phenergan  as needed.     Jacqulyn Ahle DO [facility]

## 2024-02-27 NOTE — Assessment & Plan Note (Signed)
 Rechecking today. Advised against NSAIDs at this time.

## 2024-02-28 ENCOUNTER — Ambulatory Visit: Payer: Self-pay

## 2024-02-28 ENCOUNTER — Telehealth: Payer: Self-pay | Admitting: *Deleted

## 2024-02-28 NOTE — Telephone Encounter (Signed)
 Cook, Jayce G, DO     02/27/24  8:15 PM Rx sent in

## 2024-02-28 NOTE — Telephone Encounter (Unsigned)
 Copied from CRM [id number]. Topic: Clinical - Prescription Issue >> Feb 28, 2024  1:08 PM Lonell PEDLAR wrote: Reason for CRM: patient stopped taking ibuprofen  and muscle relaxers, due to it effecting her bladder/kidney function.  Patient is requesting an rx for tylenol . Patient cannot afford to buy it OTC. Please review and advise.

## 2024-02-28 NOTE — Telephone Encounter (Signed)
 FYI Only or Action Required?: Action required by provider: medication refill request. Requesting new rx for tylenol   Patient was last seen in primary care on 02/27/2024 by Cook, Jayce G, DO.  Called Nurse Triage reporting Knee Pain and Medication Refill.  Symptoms began several years ago.  Interventions attempted: Prescription medications: Gabapentin .  Symptoms are: unchanged.  Triage Disposition: Call PCP When Office is Open (overriding See PCP When Office is Open (Within 3 Days))  Patient/caregiver understands and will follow disposition?: Yes  Copied from CRM [id number]. Topic: Clinical - Red Word Triage >> Feb 28, 2024  2:04 PM Antwanette L wrote: Red Word that prompted transfer to Nurse Triage: Patient reports experiencing pain in both knees and is requesting to speak with someone from the office. CAL noted that the office is currently short one nurse. Patient is also requesting pain medication  Reason for Disposition  Prescription request for new medicine (not a refill)    Pt just seen in office yesterday, no change in symptoms.  Answer Assessment - Initial Assessment Questions Pt saw PCP yesterday in office, discussed chronic knee pain she's had since 2008. Has been taking ibuprofen  but it has been affecting her kidneys and provider recommended against ibuprofen . Pt currently only taking gabapentin  and asking if script can be send for tylenol  as she can't afford OTC tylenol  at this time. Sending message to clinic with pt request. Confirmed phone number and pharmacy on file are correct.  1. NAME of MEDICINE: What medicine(s) are you calling about?     Tylenol   2. QUESTION: What is your question? (e.g., double dose of medicine, side effect)     If script for tylenol  can be sent to pharmacy, pt reports she can't afford OTC  3. PRESCRIBER: Who prescribed the medicine? Reason: if prescribed by specialist, call should be referred to that group.     Has not been prescribed  4.  SYMPTOMS: Do you have any symptoms? If Yes, ask: What symptoms are you having?  How bad are the symptoms (e.g., mild, moderate, severe)     910 chronic knee pain since 2008. No change in symptoms since appt with PCP yesterday.  Protocols used: Medication Question Call-A-AH

## 2024-02-29 ENCOUNTER — Other Ambulatory Visit: Payer: Self-pay | Admitting: Family Medicine

## 2024-02-29 LAB — URINALYSIS
Glucose, UA: NEGATIVE
Leukocytes,UA: NEGATIVE
Nitrite, UA: NEGATIVE
RBC, UA: NEGATIVE
Specific Gravity, UA: 1.025 (ref 1.005–1.030)
Urobilinogen, Ur: 1 mg/dL (ref 0.2–1.0)
pH, UA: 6 (ref 5.0–7.5)

## 2024-02-29 LAB — URINE CULTURE

## 2024-02-29 MED ORDER — NAPROXEN 500 MG PO TABS: 500.0000 mg | ORAL_TABLET | Freq: Two times a day (BID) | ORAL | 0 refills | Status: DC | PRN

## 2024-03-01 ENCOUNTER — Ambulatory Visit: Payer: Self-pay | Admitting: Family Medicine

## 2024-03-01 LAB — BASIC METABOLIC PANEL WITH GFR
BUN/Creatinine Ratio: 27 (ref 12–28)
BUN: 26 mg/dL (ref 8–27)
CO2: 22 mmol/L (ref 20–29)
Calcium: 8.8 mg/dL (ref 8.7–10.3)
Chloride: 105 mmol/L (ref 96–106)
Creatinine, Ser: 0.98 mg/dL (ref 0.57–1.00)
Glucose: 99 mg/dL (ref 70–99)
Potassium: 4.3 mmol/L (ref 3.5–5.2)
Sodium: 140 mmol/L (ref 134–144)
eGFR: 65 mL/min/1.73 (ref >59)

## 2024-03-01 NOTE — Telephone Encounter (Signed)
 Patient notified and verbalized understanding.

## 2024-03-01 NOTE — Telephone Encounter (Signed)
 Cook, Jayce G, DO      02/28/24  4:52 PM Please inform patient that insurance will not pay for it.

## 2024-03-04 NOTE — Telephone Encounter (Signed)
 Copied from CRM [id number]. Topic: Clinical - Prescription Issue >> Mar 04, 2024  3:49 PM Annette Galvan wrote: Reason for CRM: Patient called.. says need scripts for wheelchair and walker ( with the seat 3x )  ( social worker adv her to do this )

## 2024-03-05 ENCOUNTER — Other Ambulatory Visit: Payer: Self-pay | Admitting: Family Medicine

## 2024-03-05 DIAGNOSIS — M797 Fibromyalgia: Secondary | ICD-10-CM

## 2024-03-05 MED ORDER — TIZANIDINE HCL 4 MG PO TABS: 4.0000 mg | ORAL_TABLET | Freq: Three times a day (TID) | ORAL | 1 refills | Status: DC | PRN

## 2024-03-05 NOTE — Telephone Encounter (Signed)
 Copied from CRM [id number]. Topic: Clinical - Medication Refill >> Mar 05, 2024  8:15 AM Rosaria E wrote: Medication: tiZANidine  (ZANAFLEX ) 2 MG tablet only needs 90 tablets instead of 180 she says.   Has the patient contacted their pharmacy? Yes (Agent: If no, request that the patient contact the pharmacy for the refill. If patient does not wish to contact the pharmacy document the reason why and proceed with request.) (Agent: If yes, when and what did the pharmacy advise?)  This is the patient's preferred pharmacy:  [facility] - [place], [place] - [address] [place] [address] Phone: [phone] Fax: [phone]  Is this the correct pharmacy for this prescription? Yes If no, delete pharmacy and type the correct one.   Has the prescription been filled recently? Yes  Is the patient out of the medication? Yes  Has the patient been seen for an appointment in the last year OR does the patient have an upcoming appointment? Yes  Can we respond through MyChart? Yes  Agent: Please be advised that Rx refills may take up to 3 business days. We ask that you follow-up with your pharmacy.

## 2024-03-06 ENCOUNTER — Telehealth: Payer: Self-pay

## 2024-03-06 NOTE — Telephone Encounter (Deleted)
 Copied from CRM [id number]. Topic: Clinical - Medication Question >> Mar 06, 2024 12:01 PM Avram MATSU wrote: Reason for CRM: patient is okay is with taking the walker and stated she needs a xxxl size. If possible please have it mailed to her home. Please advise [phone] (H)

## 2024-03-06 NOTE — Telephone Encounter (Signed)
 Copied from CRM [id number]. Topic: Clinical - Medication Question >> Mar 06, 2024 12:01 PM Avram MATSU wrote: Reason for CRM: patient is okay is with taking the walker and stated she needs a xxxl size. If possible please have it mailed to her home. Please advise [phone] (H)   ---not sure where this is , please advise ---

## 2024-03-11 NOTE — Telephone Encounter (Signed)
 Called and spoke with patient regarding the need for xxl walker may try [employer]

## 2024-03-13 ENCOUNTER — Other Ambulatory Visit: Payer: Self-pay

## 2024-03-13 DIAGNOSIS — M170 Bilateral primary osteoarthritis of knee: Secondary | ICD-10-CM

## 2024-03-13 DIAGNOSIS — M797 Fibromyalgia: Secondary | ICD-10-CM

## 2024-03-13 DIAGNOSIS — M545 Low back pain, unspecified: Secondary | ICD-10-CM

## 2024-03-13 NOTE — Progress Notes (Signed)
 I have placed the walker DME order in your in-basket for signature, we will need supporting notes and face sheet faxed to [facility] [phone].

## 2024-03-13 NOTE — Telephone Encounter (Signed)
 I have placed the walker DME order in your in-basket for signature, we will need supporting notes and face sheet faxed to [facility] [phone].

## 2024-03-15 ENCOUNTER — Other Ambulatory Visit: Payer: Self-pay | Admitting: Family Medicine

## 2024-03-15 DIAGNOSIS — R062 Wheezing: Secondary | ICD-10-CM

## 2024-03-15 DIAGNOSIS — M797 Fibromyalgia: Secondary | ICD-10-CM

## 2024-03-20 ENCOUNTER — Other Ambulatory Visit: Payer: Self-pay | Admitting: Family Medicine

## 2024-03-28 ENCOUNTER — Telehealth (INDEPENDENT_AMBULATORY_CARE_PROVIDER_SITE_OTHER): Admitting: Psychiatry

## 2024-03-28 ENCOUNTER — Encounter (HOSPITAL_COMMUNITY): Payer: Self-pay | Admitting: Psychiatry

## 2024-03-28 DIAGNOSIS — F4310 Post-traumatic stress disorder, unspecified: Secondary | ICD-10-CM

## 2024-03-28 DIAGNOSIS — F319 Bipolar disorder, unspecified: Secondary | ICD-10-CM

## 2024-03-28 MED ORDER — LAMOTRIGINE 100 MG PO TABS: 100.0000 mg | ORAL_TABLET | Freq: Two times a day (BID) | ORAL | 3 refills | Status: AC

## 2024-03-28 MED ORDER — ALPRAZOLAM 1 MG PO TABS: ORAL_TABLET | ORAL | 2 refills | Status: AC

## 2024-03-28 NOTE — Progress Notes (Signed)
 Virtual Visit via Telephone Note  I connected with Annette Galvan on 03/28/2024 at  9:20 AM EST by telephone and verified that I am speaking with the correct person using two identifiers.  Location: Patient: home Provider: office   I discussed the limitations, risks, security and privacy concerns of performing an evaluation and management service by telephone and the availability of in person appointments. I also discussed with the patient that there may be a patient responsible charge related to this service. The patient expressed understanding and agreed to proceed.       I discussed the assessment and treatment plan with the patient. The patient was provided an opportunity to ask questions and all were answered. The patient agreed with the plan and demonstrated an understanding of the instructions.   The patient was advised to call back or seek an in-person evaluation if the symptoms worsen or if the condition fails to improve as anticipated.  I provided 20 minutes of non-face-to-face time during this encounter.   [name], MD  [facility] MD/PA/NP OP Progress Note  03/28/2024 9:33 AM Annette Galvan  MRN:  [id number]  Chief Complaint:  Chief Complaint  Patient presents with   Anxiety   Depression   Follow-up   HPI:  This patient is a 63 year old widowed white female who lives alone in [place]. She does have a worker who comes in several times a week. She is on disability   The patient returns for follow-up after 3 months regarding her major depression generalized anxiety disorder and PTSD.  Overall she is doing fairly well.  She is now on naproxen  for her knee pain and it seems to be helping.  She is still trying to lose weight so she can have knee surgery.  She states that her mother is not doing well physically and is in a nursing home.  She states that the siblings are having a lot of conflicts about the mother's money and she is just trying to stay out of it.  She states  that her mood is good.  She stopped the Prozac  because it made her feel weird.  However she states her mood is good with the Lamictal  and she is sleeping well with the trazodone  and Xanax  continues to help her anxiety. Visit Diagnosis:    ICD-10-CM   1. Bipolar 1 disorder (HCC)  F31.9     2. PTSD (post-traumatic stress disorder)  F43.10       Past Psychiatric History:  History of depression, anxiety and a remote history of substance abuse. Psychiatric admissions in her 50s and 30s   Past Medical History:  Past Medical History:  Diagnosis Date   Arthritis    needs 2 knee replacements; also has left shoulder and left hip pain   Back pain, chronic    Bilateral chronic knee pain    Bipolar 1 disorder (HCC)    Bulging disc    Fibromyalgia    GERD (gastroesophageal reflux disease)    HSV-2 (herpes simplex virus 2) infection 2013   Hx of trichomoniasis    Itching 06/26/2013   Leg pain    Trichimoniasis 06/26/2013   UTI (lower urinary tract infection) 10/12/2012   Vaginal discharge 06/26/2013   Vaginal irritation 12/05/2013   Vaginal odor 12/28/2012   Had discharge +clue   Yeast infection 02/04/2013    Past Surgical History:  Procedure Laterality Date   ABDOMINAL HYSTERECTOMY     total   BREAST ENHANCEMENT SURGERY Right  COLONOSCOPY WITH PROPOFOL  N/A 12/07/2015   Procedure: COLONOSCOPY WITH PROPOFOL ;  Surgeon: Lamar CHRISTELLA Hollingshead, MD;  [facility];  Service: Endoscopy;  Laterality: N/A;  915   TUBAL LIGATION      Family Psychiatric History: See below  Family History:  Family History  Problem Relation Age of Onset   Hypertension Mother    Diabetes Mother    Depression Mother    Hyperlipidemia Mother    Fibromyalgia Mother    Arthritis Mother    Cancer Father        bone   Hyperlipidemia Father    Hypertension Father    Bipolar disorder Father    Depression Sister    Hyperlipidemia Sister    Fibromyalgia Sister    Fibromyalgia Sister    Bipolar disorder Other     Drug abuse Other    Alcohol abuse Other    Hypertension Brother    Other Brother        shingles; back problems   Diabetes Paternal Grandmother    Alzheimer's disease Maternal Grandmother    Obesity Daughter    Other Daughter        back problems   Bipolar disorder Daughter    Depression Daughter    Other Daughter        on pain meds   Colon cancer Neg Hx     Social History:  Social History   Socioeconomic History   Marital status: Widowed    Spouse name: Not on file   Number of children: Not on file   Years of education: Not on file   Highest education level: Not on file  Occupational History   Occupation: Disabled  Tobacco Use   Smoking status: Light Smoker    Current packs/day: 0.25    Average packs/day: 0.3 packs/day for 25.0 years (6.3 ttl pk-yrs)    Types: Cigarettes   Smokeless tobacco: Never   Tobacco comments:    2 cigarettes daily  Vaping Use   Vaping status: Never Used  Substance and Sexual Activity   Alcohol use: Yes    Alcohol/week: 0.0 standard drinks of alcohol    Comment: occasional   Drug use: No    Comment: cocaine, clean for 4.5 years as of 12/04/2015   Sexual activity: Yes    Birth control/protection: Surgical    Comment: hysterectomy  Other Topics Concern   Not on file  Social History Narrative   Not on file   Social Drivers of Health   Tobacco Use: High Risk (02/27/2024)   Patient History    Smoking Tobacco Use: Light Smoker    Smokeless Tobacco Use: Never    Passive Exposure: Not on file  Financial Resource Strain: Low Risk (01/26/2024)   Overall Financial Resource Strain (CARDIA)    Difficulty of Paying Living Expenses: Not hard at all  Food Insecurity: No Food Insecurity (01/26/2024)   Epic    Worried About Programme Researcher, Broadcasting/film/video in the Last Year: Never true    Ran Out of Food in the Last Year: Never true  Transportation Needs: No Transportation Needs (01/26/2024)   Epic    Lack of Transportation (Medical): No    Lack of  Transportation (Non-Medical): No  Physical Activity: Inactive (01/26/2024)   Exercise Vital Sign    Days of Exercise per Week: 0 days    Minutes of Exercise per Session: 0 min  Stress: No Stress Concern Present (01/26/2024)   [employer] of Occupational Health - Occupational  Stress Questionnaire    Feeling of Stress: Only a little  Social Connections: Socially Isolated (01/26/2024)   Social Connection and Isolation Panel    Frequency of Communication with Friends and Family: Three times a week    Frequency of Social Gatherings with Friends and Family: Once a week    Attends Religious Services: Never    Database Administrator or Organizations: No    Attends Banker Meetings: Never    Marital Status: Widowed  Depression (PHQ2-9): High Risk (02/27/2024)   Depression (PHQ2-9)    PHQ-2 Score: 18  Alcohol Screen: Low Risk (01/26/2024)   Alcohol Screen    Last Alcohol Screening Score (AUDIT): 1  Housing: Unknown (01/26/2024)   Epic    Unable to Pay for Housing in the Last Year: No    Number of Times Moved in the Last Year: Not on file    Homeless in the Last Year: No  Utilities: Not At Risk (01/26/2024)   Epic    Threatened with loss of utilities: No  Health Literacy: Adequate Health Literacy (01/26/2024)   B1300 Health Literacy    Frequency of need for help with medical instructions: Never    Allergies: Allergies[1]  Metabolic Disorder Labs: Lab Results  Component Value Date   HGBA1C 5.3 11/28/2023   MPG 117 05/18/2020   MPG 108 12/02/2019   No results found for: PROLACTIN Lab Results  Component Value Date   CHOL 183 11/28/2023   TRIG 176 (H) 11/28/2023   HDL 63 11/28/2023   CHOLHDL 2.9 11/28/2023   VLDL 23 08/12/2016   LDLCALC 90 11/28/2023   LDLCALC 93 05/12/2022   Lab Results  Component Value Date   TSH 1.690 11/28/2023   TSH 0.601 05/12/2022    Therapeutic Level Labs: No results found for: LITHIUM No results found for:  VALPROATE No results found for: CBMZ  Current Medications: Current Outpatient Medications  Medication Sig Dispense Refill   acetaminophen  (TYLENOL ) 500 MG tablet Take 1 tablet (500 mg total) by mouth every 6 (six) hours as needed. 30 tablet 0   acyclovir  ointment (ZOVIRAX ) 5 % Apply 1 Application topically 5 (five) times daily. 30 g 0   albuterol  (VENTOLIN  HFA) 108 (90 Base) MCG/ACT inhaler INHALE 2 PUFFS EVERY 6 HOURS AS NEEDED. 8.5 g 1   ALPRAZolam  (XANAX ) 1 MG tablet TAKE (1) TABLET BY MOUTH (4) TIMES DAILY AS NEEDED FOR ANXIETY. 120 tablet 2   cetirizine  (ZYRTEC ) 10 MG tablet Take 1 tablet (10 mg total) by mouth daily. 30 tablet 2   chlorhexidine  (PERIDEX ) 0.12 % solution SMARTSIG:By Mouth     diclofenac  Sodium (VOLTAREN ) 1 % GEL Apply 2 g topically 4 (four) times daily. 100 g 0   fluticasone  (FLONASE ) 50 MCG/ACT nasal spray PLACE 2 SPRAYS IN EACH NOSTRIL ONCE DAILY AS NEEDED FOR HEAD CONGESTION AS DIRECTED. 16 g 1   gabapentin  (NEURONTIN ) 300 MG capsule TAKE 2 CAPSULES BY MOUTH 3 TIMES DAILY. 180 capsule 11   lamoTRIgine  (LAMICTAL ) 100 MG tablet Take 1 tablet (100 mg total) by mouth 2 (two) times daily. 60 tablet 3   losartan  (COZAAR ) 50 MG tablet Take 1 tablet (50 mg total) by mouth daily. 100 tablet 3   mometasone  (NASONEX ) 50 MCG/ACT nasal spray Place 2 sprays into the nose daily. 1 each 2   naproxen  (NAPROSYN ) 500 MG tablet Take 1 tablet (500 mg total) by mouth 2 (two) times daily as needed for moderate pain (pain score 4-6). 30 tablet  0   omeprazole  (PRILOSEC) 40 MG capsule TAKE (1) CAPSULE BY MOUTH ONCE DAILY FOR ACID REFLUX. 90 capsule 0   promethazine  (PHENERGAN ) 25 MG tablet Take 1 tablet (25 mg total) by mouth every 8 (eight) hours as needed for nausea or vomiting. 20 tablet 0   rosuvastatin  (CRESTOR ) 5 MG tablet Take 1 tablet (5 mg total) by mouth daily. 90 tablet 3   semaglutide -weight management (WEGOVY ) 0.25 MG/0.5ML SOAJ SQ injection Inject 0.25 mg into the skin once a  week. 2 mL 0   sulfamethoxazole -trimethoprim  (BACTRIM  DS) 800-160 MG tablet Take 1 tablet by mouth 2 (two) times daily. 14 tablet 0   tiZANidine  (ZANAFLEX ) 4 MG tablet Take 1 tablet (4 mg total) by mouth every 8 (eight) hours as needed for muscle spasms. 30 tablet 1   traZODone  (DESYREL ) 150 MG tablet Take 2 tablets (300 mg total) by mouth at bedtime. 60 tablet 2   No current facility-administered medications for this visit.     Musculoskeletal: Strength & Muscle Tone: na Gait & Station: na Patient leans: N/A  Psychiatric Specialty Exam: Review of Systems  Musculoskeletal:  Positive for arthralgias.  All other systems reviewed and are negative.   There were no vitals taken for this visit.There is no height or weight on file to calculate BMI.  General Appearance: NA  Eye Contact:  NA  Speech:  Clear and Coherent  Volume:  Normal  Mood:  Euthymic  Affect:  Congruent  Thought Process:  Goal Directed  Orientation:  Full (Time, Place, and Person)  Thought Content: WDL   Suicidal Thoughts:  No  Homicidal Thoughts:  No  Memory:  Immediate;   Good Recent;   Good Remote;   NA  Judgement:  Good  Insight:  Fair  Psychomotor Activity:  Normal  Concentration:  Concentration: Good and Attention Span: Good  Recall:  Good  Fund of Knowledge: Fair  Language: Good  Akathisia:  No  Handed:  Right  AIMS (if indicated): not done  Assets:  Communication Skills Desire for Improvement Resilience Social Support  ADL's:  Intact  Cognition: WNL  Sleep:  Good   Screenings: GAD-7    Flowsheet Row Office Visit from 02/27/2024 in [facility] Office Visit from 11/28/2023 in [facility] Office Visit from 04/07/2023 in [facility] [place] [facility] Office Visit from 09/23/2022 in [facility]  Total GAD-7 Score 6 5 5 5    PHQ2-9    Flowsheet Row Office Visit from 02/27/2024 in [facility] [place] [facility] Clinical Support from 01/26/2024 in [facility] Office Visit from 11/28/2023 in [facility] Office Visit from 04/07/2023 in [facility] Office Visit from 09/23/2022 in [facility]  PHQ-2 Total Score 5 3 3 3 4   PHQ-9 Total Score 18 11 11  -- 14   Flowsheet Row Video Visit from 01/10/2022 in [facility] [place] Video Visit from 10/11/2021 in [facility] [place] Video Visit from 06/24/2021 in [facility] [place]  C-SSRS RISK CATEGORY No Risk No Risk No Risk     Assessment and Plan: This patient is a 63 year old female with a history of posttraumatic stress disorder and generalized anxiety disorder as well as major depression.  She has stopped Prozac  and claims she feels better without it.  She will continue Lamictal  100 mg twice daily  for major depression, Xanax  1 mg up to 4 times daily as needed for generalized anxiety and trazodone  300 mg at bedtime for sleep.  She will return to see me in 4 months  Collaboration of Care: Collaboration of Care: Primary Care Provider AEB notes are shared with PCP on the epic system  Patient/Guardian was advised Release of Information must be obtained prior to any record release in order to collaborate their care with an outside provider. Patient/Guardian was advised if they have not already done so to contact the registration department to sign all necessary forms in order for us  to release information regarding their care.   Consent: Patient/Guardian gives verbal consent for treatment and assignment of benefits for services provided during this visit. Patient/Guardian expressed understanding and agreed to proceed.    [name], MD 03/28/2024, 9:33 AM     [1]  Allergies Allergen Reactions   Amoxicillin  Hives   Hydrocodone  Nausea And Vomiting    Latex Swelling    Ankle Area   Lisinopril  Cough   Ampicillin Hives and Nausea And Vomiting

## 2024-04-17 ENCOUNTER — Other Ambulatory Visit: Payer: Self-pay | Admitting: Family Medicine

## 2024-05-02 NOTE — Telephone Encounter (Signed)
 Copied from CRM [id number]. Topic: Clinical - Prescription Issue >> Apr 29, 2024 11:45 AM Ole G wrote: Reason for CRM: Mliss w/Care Navigator on behalf of [facility].SABRA asking us  to call patient w/update on requested rollater walker and cane ( said patient has been waiting ) [phone] ext [phone] in case you need Mliss ----  Have contacted [facility] about the requested bariatric rollator/waker They have no record - will need a new prescription and notes for 'bariatric rollator' and to state in the note 'need for bariatric rollator due to weight proportion'

## 2024-07-26 ENCOUNTER — Telehealth (HOSPITAL_COMMUNITY): Admitting: Psychiatry
